# Patient Record
Sex: Female | Born: 1945 | Race: White | Hispanic: No | Smoking: Never smoker
Health system: Southern US, Community
[De-identification: ages and names within clinical notes are randomized; demographics above are authoritative.]

## PROBLEM LIST (undated history)

## (undated) DIAGNOSIS — R112 Nausea with vomiting, unspecified: Secondary | ICD-10-CM

## (undated) DIAGNOSIS — S0990XA Unspecified injury of head, initial encounter: Secondary | ICD-10-CM

## (undated) DIAGNOSIS — F32A Depression, unspecified: Secondary | ICD-10-CM

## (undated) DIAGNOSIS — C9000 Multiple myeloma not having achieved remission: Secondary | ICD-10-CM

## (undated) DIAGNOSIS — F419 Anxiety disorder, unspecified: Secondary | ICD-10-CM

## (undated) DIAGNOSIS — Z9889 Other specified postprocedural states: Secondary | ICD-10-CM

## (undated) DIAGNOSIS — M810 Age-related osteoporosis without current pathological fracture: Secondary | ICD-10-CM

## (undated) DIAGNOSIS — F329 Major depressive disorder, single episode, unspecified: Secondary | ICD-10-CM

## (undated) DIAGNOSIS — J45909 Unspecified asthma, uncomplicated: Secondary | ICD-10-CM

## (undated) HISTORY — DX: Age-related osteoporosis without current pathological fracture: M81.0

## (undated) HISTORY — PX: ABDOMINAL HYSTERECTOMY: SHX81

## (undated) HISTORY — PX: HEMORROIDECTOMY: SUR656

## (undated) HISTORY — DX: Anxiety disorder, unspecified: F41.9

## (undated) HISTORY — PX: SEPTOPLASTY: SUR1290

## (undated) HISTORY — DX: Major depressive disorder, single episode, unspecified: F32.9

## (undated) HISTORY — DX: Depression, unspecified: F32.A

## (undated) HISTORY — PX: MOUTH SURGERY: SHX715

---

## 2005-07-16 ENCOUNTER — Ambulatory Visit: Payer: Self-pay | Admitting: Family Medicine

## 2006-07-08 ENCOUNTER — Emergency Department: Payer: Self-pay | Admitting: Unknown Physician Specialty

## 2006-07-19 ENCOUNTER — Ambulatory Visit: Payer: Self-pay | Admitting: Family Medicine

## 2006-10-31 ENCOUNTER — Emergency Department: Payer: Self-pay | Admitting: Emergency Medicine

## 2006-11-03 ENCOUNTER — Ambulatory Visit: Payer: Self-pay | Admitting: Orthopaedic Surgery

## 2007-07-26 ENCOUNTER — Ambulatory Visit: Payer: Self-pay | Admitting: Family Medicine

## 2007-08-29 ENCOUNTER — Ambulatory Visit: Payer: Self-pay | Admitting: Unknown Physician Specialty

## 2008-08-14 ENCOUNTER — Ambulatory Visit: Payer: Self-pay

## 2010-02-04 ENCOUNTER — Ambulatory Visit: Payer: Self-pay | Admitting: Family Medicine

## 2010-02-11 ENCOUNTER — Ambulatory Visit: Payer: Self-pay | Admitting: Family Medicine

## 2010-05-12 ENCOUNTER — Ambulatory Visit: Payer: Self-pay | Admitting: Family Medicine

## 2010-08-20 ENCOUNTER — Ambulatory Visit: Payer: Self-pay | Admitting: Family Medicine

## 2011-04-14 ENCOUNTER — Ambulatory Visit: Payer: Self-pay | Admitting: Family Medicine

## 2012-03-24 ENCOUNTER — Ambulatory Visit: Payer: Self-pay | Admitting: Primary Care

## 2012-07-11 ENCOUNTER — Ambulatory Visit: Payer: Self-pay | Admitting: Family Medicine

## 2012-10-03 ENCOUNTER — Ambulatory Visit: Payer: Self-pay | Admitting: Family Medicine

## 2012-12-27 LAB — CBC
HCT: 42.4 % (ref 35.0–47.0)
MCH: 31.5 pg (ref 26.0–34.0)
MCHC: 34.7 g/dL (ref 32.0–36.0)
MCV: 91 fL (ref 80–100)
Platelet: 324 10*3/uL (ref 150–440)
WBC: 7 10*3/uL (ref 3.6–11.0)

## 2012-12-27 LAB — BASIC METABOLIC PANEL
Anion Gap: 11 (ref 7–16)
Chloride: 105 mmol/L (ref 98–107)
Co2: 20 mmol/L — ABNORMAL LOW (ref 21–32)
Creatinine: 1.19 mg/dL (ref 0.60–1.30)
EGFR (African American): 55 — ABNORMAL LOW
EGFR (Non-African Amer.): 48 — ABNORMAL LOW
Glucose: 182 mg/dL — ABNORMAL HIGH (ref 65–99)
Osmolality: 278 (ref 275–301)
Sodium: 136 mmol/L (ref 136–145)

## 2012-12-28 ENCOUNTER — Inpatient Hospital Stay: Payer: Self-pay | Admitting: Internal Medicine

## 2012-12-29 LAB — PRO B NATRIURETIC PEPTIDE: B-Type Natriuretic Peptide: 567 pg/mL — ABNORMAL HIGH (ref 0–125)

## 2012-12-29 LAB — CBC WITH DIFFERENTIAL/PLATELET
Basophil %: 0.1 %
Eosinophil #: 0 10*3/uL (ref 0.0–0.7)
HCT: 39 % (ref 35.0–47.0)
Lymphocyte #: 0.8 10*3/uL — ABNORMAL LOW (ref 1.0–3.6)
Lymphocyte %: 5.4 %
MCH: 31.5 pg (ref 26.0–34.0)
MCHC: 34.4 g/dL (ref 32.0–36.0)
MCV: 92 fL (ref 80–100)
Monocyte #: 0.4 x10 3/mm (ref 0.2–0.9)
Neutrophil #: 12.9 10*3/uL — ABNORMAL HIGH (ref 1.4–6.5)
Neutrophil %: 91.6 %
Platelet: 288 10*3/uL (ref 150–440)
RBC: 4.26 10*6/uL (ref 3.80–5.20)
RDW: 13.8 % (ref 11.5–14.5)
WBC: 14.1 10*3/uL — ABNORMAL HIGH (ref 3.6–11.0)

## 2012-12-29 LAB — BASIC METABOLIC PANEL
Anion Gap: 7 (ref 7–16)
BUN: 21 mg/dL — ABNORMAL HIGH (ref 7–18)
Chloride: 108 mmol/L — ABNORMAL HIGH (ref 98–107)
Co2: 25 mmol/L (ref 21–32)
Creatinine: 1.01 mg/dL (ref 0.60–1.30)
EGFR (African American): >60
Osmolality: 286 (ref 275–301)

## 2012-12-31 DIAGNOSIS — I059 Rheumatic mitral valve disease, unspecified: Secondary | ICD-10-CM

## 2013-02-06 ENCOUNTER — Ambulatory Visit: Payer: Self-pay | Admitting: Family Medicine

## 2013-03-23 ENCOUNTER — Ambulatory Visit: Payer: Self-pay

## 2013-05-30 ENCOUNTER — Encounter: Payer: Self-pay | Admitting: Otolaryngology

## 2013-06-04 ENCOUNTER — Encounter: Payer: Self-pay | Admitting: Otolaryngology

## 2013-07-02 ENCOUNTER — Encounter: Payer: Self-pay | Admitting: Otolaryngology

## 2013-10-11 ENCOUNTER — Ambulatory Visit: Payer: Self-pay | Admitting: Family Medicine

## 2013-11-20 ENCOUNTER — Ambulatory Visit: Payer: Self-pay | Admitting: Family Medicine

## 2013-11-21 ENCOUNTER — Ambulatory Visit: Payer: Self-pay | Admitting: Family Medicine

## 2013-11-27 ENCOUNTER — Ambulatory Visit: Payer: Self-pay | Admitting: Physician Assistant

## 2014-01-18 DIAGNOSIS — J4520 Mild intermittent asthma, uncomplicated: Secondary | ICD-10-CM

## 2014-02-26 ENCOUNTER — Emergency Department: Payer: Self-pay | Admitting: Student

## 2014-06-21 ENCOUNTER — Ambulatory Visit: Payer: Self-pay | Admitting: Family Medicine

## 2014-08-24 NOTE — Discharge Summary (Signed)
PATIENT NAME:  Laurie [name], Laurie [name] MR#:  [id number] DATE OF BIRTH:  1945-05-19  DATE OF ADMISSION:  12/28/2012 DATE OF DISCHARGE:  01/01/2013  DISCHARGE DIAGNOSES:  1. Respiratory distress secondary to pneumonia.  2. Asthma exacerbation.  3. Anxiety.  4. Acute on chronic diastolic heart failure.  5. Depression.   DISCHARGE MEDICATIONS:  1. BuSpar 10 mg p.o. t.i.d. 2. Trazodone 200 mg p.o. daily.  3. Prednisone 20 mg 3 tablets daily for 2 days, 2 tablets daily for 2 days and then 1 tablet daily for 2 days and then stop.  4. Singulair 10 mg p.o. daily.  5. Flonase 50 mcg inhalation daily.  6. Lipitor 20 mg daily.  7. Omeprazole 40 mg p.o. daily.  8. Zoloft 100 mg p.o. daily.  9. ProAir 2 puffs every 4 to 6 hours as needed for wheezing.  10. QVAR 80 mcg 4 puffs b.i.d. after the prednisone is finished.  11. Levaquin 750 mg daily for 3 days.   DIET: Low sodium diet.   CONSULTATIONS: None.   HOSPITAL COURSE: This is a 69 year old female patient who is really very anxious, who came in because of shortness of breath. The patient seen by Dr. Baltazar Apo. She follows up with her. According to the patient, she is almost very inactive because of her asthma exacerbations. Gets short of breath very easily. She used to follow up with Dr. Raul Del, but she is now not following with him. She came in because of shortness of breath, hypoxic with O2 saturation 80% on room air. Required 3 liters of oxygen. On 3 liters, she was 90% to 96%. X-ray of chest was concerning for left lower pneumonia. The patient admitted for this and started on Solu-Medrol, Levaquin, oxygen and DuoNebs. The patient's symptoms improved. Before this episode happened, the patient started on prednisone by her primary doctor. On admission, the patient was short of breath with EKG showing of sinus tach at 114 beats per minute. She actually continued to have shortness of breath despite no wheezing in her lungs. We had a CT of the chest to rule  out PE, which was negative for pulmonary emboli. The patient's BNP was elevated at 567. O2 saturations were about 94% on 2 liters, so echocardiogram obtained, which showed EF of 55% to 60% with normal LV function, with impaired LV relaxation. After listening to her reports, the patient felt better, and the patient did have a lot of anxiety component to her symptoms. Restarted her BuSpar and Zoloft. The patient assured that her asthma flare is improved. Her O2 saturations came back to 95% on room air, and her vitals were stable at the time of discharge. We weaned off steroids and discharged her with a course of prednisone and Levaquin. She is advised to continue QVAR and see the primary doctor as scheduled. The patient's troponins were negative on admission. Chest x-ray showed discoid atelectasis in the left lower lobe and shallow inspiration.   TIME SPENT: More than 30 minutes.  ____________________________ Epifanio Lesches, MD sk:OSi D: 01/03/2013 08:45:52 ET T: 01/03/2013 09:23:16 ET JOB#: [id number]  cc: Epifanio Lesches, MD, <Dictator> Sarah "Sallie" Posey Pronto, MD Epifanio Lesches MD ELECTRONICALLY SIGNED 01/13/2013 22:46

## 2014-08-24 NOTE — H&P (Signed)
PATIENT NAME:  Laurie, [name] MR#:  [id number] DATE OF BIRTH:  1945-10-06  DATE OF ADMISSION:  12/27/2012  PRIMARY CARE PHYSICIAN:  Dr. Denton Lank.  REFERRING PHYSICIAN:  Dr. Owens Shark.   CHIEF COMPLAINT:  Cough and shortness of breath.   HISTORY OF PRESENT ILLNESS:  The patient is a 69 year old Caucasian female with a past medical history of asthma, emphysema, is presenting to the ER with a chief complaint of shortness of breath and cough.  The patient is reporting that she has been coughing since last Wednesday associated with shortness of breath and progressively it has been getting worse.  She tried to get an appointment with her primary care physician during last Thursday, but was unable to get any and was seen by her primary care physician yesterday and the patient was started on prednisone.  Also, she was started on Qvar.  Though patient is on prednisone still her shortness of breath is progressively getting worse and was unable to breathe tonight.  The patient was brought into the ER by her family members and initially she was hypoxemic with a pulse ox of 80%.  The patient is placed on 3 liters initially and subsequently oxygen was reduced to 2 liters the patient was sating 92% to 96%.  Chest x-ray has revealed developing left lower lobe pneumonia.  The patient was also wheezing.  Regarding this, the patient was given Solu-Medrol IV and levofloxacin IV.  Hospitalist team is called to admit the patient.  During my examination, the patient started feeling better and reporting that shortness of breath is significantly improving.  Denies any chest pain, abdominal pain, nausea, vomiting, diarrhea.   PAST MEDICAL HISTORY:  Chronic history of asthma, anxiety, panic attacks, emphysema, seasonal allergies.   PAST SURGICAL HISTORY:  Hysterectomy, hemorrhoidectomy.   ALLERGIES:  No known drug allergies.   PSYCHOSOCIAL HISTORY:  Lives at home with sister-in-law.  Denies any history of smoking, alcohol or  illicit drug use.   FAMILY HISTORY:  Dad deceased with emphysema.  Mom was deceased in a motor vehicle accident.   HOME MEDICATIONS:  Zoloft, trazodone, Protonix, Motrin, BuSpar.  Doses unknown.   REVIEW OF SYSTEMS:   CONSTITUTIONAL:  Denies any fever, fatigue.  EYES:  Denies blurry vision, glaucoma.  EARS, NOSE, THROAT:  No epistaxis or discharge.  RESPIRATION:  Complaining of a cough, chronic history of emphysema and asthma are present.  CARDIOVASCULAR:  Denies any chest pain, palpitations.  GASTROINTESTINAL:  Denies nausea, vomiting, diarrhea.  GENITOURINARY:  No dysuria, hematuria.  GYNECOLOGIC AND BREAST:  Denies breast mass or vaginal discharge, status post hysterectomy.  ENDOCRINE:  Denies polyuria, nocturia, increased sweating.  HEMATOLOGIC AND LYMPHATIC:  No anemia, easy bruising, bleeding.  INTEGUMENTARY:  No acne, rash, lesions.  MUSCULOSKELETAL:  Denies any joint pain in neck, back pain.  Denies gout.  NEUROLOGIC:  No vertigo or ataxia.  PSYCHIATRIC:  No ADD, OCD, chronic history of anxiety is present.   PHYSICAL EXAMINATION: VITAL SIGNS:  Temperature 98.5, pulse 98, respirations 22, blood pressure 165/93, pulse ox 96% on 2 liters. GENERAL APPEARANCE:  Not under acute distress.  Moderately built and nourished.  HEENT:  Normocephalic, atraumatic.  Pupils are equal, reacting to light and accommodation.  No scleral icterus.  No conjunctival injection.  No sinus tenderness.  No postnasal drip.  NECK:  Supple.  No JVD.  No thyromegaly.  LUNGS:  Positive crackles at the bases, more on the left than on the right.  Distant breath sounds.  Moderate  air entry.  Minimal end expiratory wheezing is present.  CARDIAC:  S1, S2 normal.  Regular rate and rhythm.  No murmurs.  GASTROINTESTINAL:  Soft.  Bowel sounds are positive in all four quadrants.  Nontender, nondistended.  No hepatosplenomegaly.  NEUROLOGIC:  Awake, alert, oriented x 3.  Motor and sensory grossly intact.  Reflexes are 2+.   EXTREMITIES:  No edema.  No cyanosis.  No clubbing.  PSYCHIATRIC:  Normal mood and affect.  MUSCULOSKELETAL:  No joint effusion, tenderness or erythema.   LABORATORY AND IMAGING STUDIES:  Glucose 182, BUN 16, creatinine 1.19, sodium 136, potassium 3.9, chloride 105, CO2 20.  Anion gap 11.  GFR is 48.  Serum osmolality 278, calcium 9.3, troponin less than 0.02.  CBC is normal.  Chest x-ray, developing left lower lobe infiltrate.  A 12-lead EKG, sinus tachycardia at 114 beats per minute.  Normal PR and QRS interval.  Left ventricular hypertrophy.  No acute ST-T wave changes.   ASSESSMENT AND PLAN:  A 69 year old Caucasian female presenting to the ER with a chief complaint of shortness of breath and hypoxia will be admitted with the following assessment and plan.  1.  Shortness of breath from asthma exacerbation and developing pneumonia.  The patient will be admitted to telemetry.  The patient will be on Solu-Medrol and neb treatments.  We will provide her IV antibiotic levofloxacin.  Sputum culture and sensitivity will be obtained.  2.  Chronic history of asthma and emphysema.  3.  Chronic history of anxiety.  We will do med reconciliation and resume medications.  4.  Hypoxemia is significantly improved.  5.  We will provide her GI and DVT prophylaxis.   Diagnosis and plan of care was discussed in detail with the patient.  She is aware of the plan.   CODE STATUS:  SHE IS FULL CODE.  Sister-in-law is her medical power of attorney.   Total time spent on admission is 50 minutes.     ____________________________ Nicholes Mango, MD ag:ea D: 12/28/2012 02:52:56 ET T: 12/28/2012 03:39:09 ET JOB#: [id number]  cc: Nicholes Mango, MD, <Dictator> Sarah "Sallie" Posey Pronto, MD Nicholes Mango MD ELECTRONICALLY SIGNED 01/06/2013 2:21

## 2014-08-28 ENCOUNTER — Encounter
Admit: 2014-08-28 | Disposition: A | Discharge status: Home / Self Care | Payer: Self-pay | Attending: Family Medicine | Admitting: Family Medicine

## 2014-09-10 ENCOUNTER — Encounter: Payer: Self-pay | Admitting: Physical Therapy

## 2014-09-10 ENCOUNTER — Ambulatory Visit: Payer: Medicare Other | Attending: Family Medicine | Admitting: Physical Therapy

## 2014-09-10 DIAGNOSIS — Z87820 Personal history of traumatic brain injury: Secondary | ICD-10-CM | POA: Diagnosis not present

## 2014-09-10 DIAGNOSIS — R2689 Other abnormalities of gait and mobility: Secondary | ICD-10-CM | POA: Insufficient documentation

## 2014-09-10 DIAGNOSIS — R296 Repeated falls: Secondary | ICD-10-CM | POA: Insufficient documentation

## 2014-09-10 DIAGNOSIS — M6281 Muscle weakness (generalized): Secondary | ICD-10-CM

## 2014-09-10 DIAGNOSIS — R269 Unspecified abnormalities of gait and mobility: Secondary | ICD-10-CM

## 2014-09-10 NOTE — Therapy (Signed)
[facility] [address] [place], [place], [address] Phone: [phone]   Fax:  [phone]  Physical Therapy Treatment  Patient Details  Name: Laurie [name] MRN: [id number] Date of Birth: 12-Jul-1945 Referring Provider:  Denton Lank, MD  Encounter Date: 09/10/2014      PT End of Session - 09/10/14 1307    Visit Number 2   Number of Visits 12   Date for PT Re-Evaluation 10/09/14   Authorization - Visit Number 2   Authorization - Number of Visits 10   PT Start Time 3532   PT Stop Time 0951   PT Time Calculation (min) 56 min   Equipment Utilized During Treatment Gait belt   Activity Tolerance No increased pain;Patient limited by fatigue   Behavior During Therapy [facility] for tasks assessed/performed      Past Medical History  Diagnosis Date  . Anxiety   . Depression   . Osteoporosis     No past surgical history on file.  There were no vitals filed for this visit.  Visit Diagnosis:  Abnormality of gait  Muscle weakness (generalized)      Subjective Assessment - 09/10/14 1302    Subjective Pt. states she has difficulty completing current HEP without increase pain in neck/ L shoulder region.  Pt. entered PT with use of SPC today.     Pertinent History see evaluation   Limitations Standing;Walking   Patient Stated Goals improve balance/walking and decrease neck pain.     Currently in Pain? Yes   Pain Score 2    Pain Location Neck   Pain Orientation Left   Pain Onset More than a month ago   Pain Frequency Intermittent   Aggravating Factors  exercising/ increase activity.       OBJECTIVE:  Reviewed HEP.  Added standing UT L/R stretches and chin tucks (see handouts).  Scifit L4 5 min. B UE/LE (no pain or issues reported)- no charge.  Neuromuscular reed.:  Resisted gait 1BTB 5x all 4-planes with min. To no UE assist.  Tandem stance/ walking/ turning/ recip. Step touches/ functional reaching/ forwards, backwards, lateral  walking with no UE assist (mirror feedback for posture correction).      Pt response for medical necessity:  No increase c/o pain.  Several short seated rest breaks required secondary to SOB/ asthma issues.         PT Education - 09/10/14 1307    Education provided Yes   Education Details reviewed HEP/ issued new HEP   Person(s) Educated Patient   Methods Explanation;Demonstration;Handout   Comprehension Verbalized understanding;Returned demonstration             PT Long Term Goals - 09/10/14 1318    PT LONG TERM GOAL #1   Title Patient will be independent with home exercise program for decreased fall risk and improve safety with functional activities    Time 6   Period Weeks   Status Not Met   PT LONG TERM GOAL #2   Title Patient will be able to stand with eyes closed for > 30 seconds for decreased risk for falls    Time 6   Period Weeks   Status Not Met   PT LONG TERM GOAL #3   Title Patient will improve Dynamic Gait Index (DGI) score to > 20/24 for low falls risk regarding dynamic walking tasks    Time 6   Period Weeks   Status Not Met   PT LONG TERM  GOAL #4   Title Patient will increase ABC Scale score to > or = 67% demonstrating a decreased risk for falls    Time 6   Period Weeks   Status Not Met               Plan - 09/10/14 1315    Clinical Impression Statement Pt. has limited endurance with all standing/ ther.ex. and requires several short seated rest breaks to prevent shortness of breath.  No LOB during tx. session.  Good technique/ demonstrate of HEP with minimal cuing to modify technique.    Pt will benefit from skilled therapeutic intervention in order to improve on the following deficits Decreased endurance;Improper body mechanics;Decreased strength;Postural dysfunction;Decreased balance;Decreased mobility;Difficulty walking;Pain;Decreased range of motion   Rehab Potential Good   PT Frequency 2x / week   PT Duration 6 weeks   PT  Treatment/Interventions ADLs/Self Care Home Management;Gait training;Neuromuscular re-education;Stair training;Patient/family education;Functional mobility training;Therapeutic activities;Therapeutic exercise;Manual techniques;Balance training;Moist Heat   PT Next Visit Plan reassess HEP/gait and balance training.    PT Home Exercise Plan see handouts.    Consulted and Agree with Plan of Care Patient        Problem List There are no active problems to display for this patient.   Pura Spice, PT, DPT # [phone]   09/10/2014, 1:26 PM  [facility] [address] [place], [place], [address] Phone: [phone]   Fax:  [phone]

## 2014-09-10 NOTE — Patient Instructions (Signed)
Issued HEP handouts:  AROM cerv retract (chin tuck) sit/stand and Stretch Trapezius upper (seated or standing).

## 2014-09-12 ENCOUNTER — Ambulatory Visit: Payer: Medicare Other | Admitting: Physical Therapy

## 2014-09-17 ENCOUNTER — Ambulatory Visit: Payer: Medicare Other | Admitting: Physical Therapy

## 2014-09-17 ENCOUNTER — Encounter: Payer: Self-pay | Admitting: Physical Therapy

## 2014-09-17 DIAGNOSIS — M6281 Muscle weakness (generalized): Secondary | ICD-10-CM

## 2014-09-17 DIAGNOSIS — R269 Unspecified abnormalities of gait and mobility: Secondary | ICD-10-CM

## 2014-09-17 DIAGNOSIS — R2689 Other abnormalities of gait and mobility: Secondary | ICD-10-CM | POA: Diagnosis not present

## 2014-09-17 NOTE — Therapy (Signed)
[facility] [address] [place], [place], [address] Phone: [phone]   Fax:  [phone]  Physical Therapy Treatment  Patient Details  Name: Laurie [name] MRN: [id number] Date of Birth: 04-02-1946 Referring Provider:  Hillery Aldo, MD  Encounter Date: 09/17/2014      PT End of Session - 09/17/14 0945    Visit Number 3   Number of Visits 12   Date for PT Re-Evaluation 10/09/14   Authorization - Visit Number 3   Authorization - Number of Visits 10   PT Start Time 0857   PT Stop Time 0946   PT Time Calculation (min) 49 min   Equipment Utilized During Treatment Gait belt   Activity Tolerance No increased pain;Patient limited by fatigue   Behavior During Therapy [facility] for tasks assessed/performed      Past Medical History  Diagnosis Date  . Anxiety   . Depression   . Osteoporosis     History reviewed. No pertinent past surgical history.  There were no vitals filed for this visit.  Visit Diagnosis:  Abnormality of gait  Muscle weakness (generalized)      Subjective Assessment - 09/17/14 0900    Subjective Pt. states she has been walking more since last PT visit and not using SPC.  Pt. didn't bring SPC to [employer] yesterday.     Limitations Standing;Walking   Patient Stated Goals improve balance/walking and decrease neck pain.     Currently in Pain? Yes   Pain Score 1    Pain Location Neck   Pain Orientation Left   Pain Onset More than a month ago   Aggravating Factors  increase activity.   Pain Relieving Factors memory pillow has helped at night   Multiple Pain Sites No       OBJECTIVE: Seated UT and levator stretches with PT over pressure L/R stretches and chin tucks (reviewed handouts). Scifit L4 7 min. B UE/LE - no charge. Neuromuscular reed.:  Step ups/downs with no UE assist 10x2 (mirror feedback). Resisted gait 2BTB 5x all 4-planes with no UE assist today (increase resistance/ improved technique/  gait pattern).Sit to stands 10x with no UE (25.11 sec.).  Tandem stance/ walking, walking forwards/ backwards/ lateral walking with no UE assist (mirror feedback for posture correction).  SLS (>15 sec. On L/R with no UE assist.  Outside walking on various terrain (grass/ curbs/ ramps).        Pt response for medical necessity: No increase c/o pain. Several short seated rest breaks required secondary to SOB/ asthma issues.  (+) L UT muscle tightness/ tenderness        PT Long Term Goals - 09/17/14 0948    PT LONG TERM GOAL #1   Title Patient will be independent with home exercise program for decreased fall risk and improve safety with functional activities    Time 6   Period Weeks   Status Not Met   PT LONG TERM GOAL #2   Title Patient will be able to stand with eyes closed for > 30 seconds for decreased risk for falls    Time 6   Period Weeks   Status Not Met   PT LONG TERM GOAL #3   Title Patient will improve Dynamic Gait Index (DGI) score to > 20/24 for low falls risk regarding dynamic walking tasks    Time 6   Period Weeks   Status Not Met   PT LONG TERM GOAL #4  Title Patient will increase ABC Scale score to > or = 67% demonstrating a decreased risk for falls    Time 6   Period Weeks   Status Not Met               Plan - 09/17/14 0946    Clinical Impression Statement Marked improvement in normalized gait pattern and balance training today.  Pt. able to complete all tasks with SBA/mod. I safely and ambulate on uneven/grassy terrain with no LOB.  Several rest breaks required secondary to asthma/ shortness of breath.     Pt will benefit from skilled therapeutic intervention in order to improve on the following deficits Decreased endurance;Improper body mechanics;Decreased strength;Postural dysfunction;Decreased balance;Decreased mobility;Difficulty walking;Pain;Decreased range of motion   Rehab Potential Good   PT Frequency 2x / week   PT Duration 6  weeks   PT Treatment/Interventions ADLs/Self Care Home Management;Gait training;Neuromuscular re-education;Stair training;Patient/family education;Functional mobility training;Therapeutic activities;Therapeutic exercise;Manual techniques;Balance training;Moist Heat   PT Next Visit Plan gait/balance training   PT Home Exercise Plan see handouts.    Consulted and Agree with Plan of Care Patient        Problem List There are no active problems to display for this patient.   Pura Spice, PT, DPT # [phone]   09/17/2014, 9:52 AM  [facility] [address] [place], [place], [address] Phone: [phone]   Fax:  [phone]

## 2014-09-19 ENCOUNTER — Ambulatory Visit: Payer: Medicare Other | Admitting: Physical Therapy

## 2014-09-19 DIAGNOSIS — M6281 Muscle weakness (generalized): Secondary | ICD-10-CM

## 2014-09-19 DIAGNOSIS — R269 Unspecified abnormalities of gait and mobility: Secondary | ICD-10-CM

## 2014-09-19 DIAGNOSIS — R2689 Other abnormalities of gait and mobility: Secondary | ICD-10-CM | POA: Diagnosis not present

## 2014-09-19 NOTE — Therapy (Signed)
[facility] [address] [place], [place], [address] Phone: [phone]   Fax:  [phone]  Physical Therapy Treatment  Patient Details  Name: Laurie [name] MRN: [id number] Date of Birth: 1945/08/24 Referring Provider:  Denton Lank, MD  Encounter Date: 09/19/2014      PT End of Session - 09/19/14 1014    Visit Number 4   Number of Visits 12   Date for PT Re-Evaluation 10/09/14   Authorization - Visit Number 4   Authorization - Number of Visits 10   PT Start Time 0902   PT Stop Time 1001   PT Time Calculation (min) 59 min   Activity Tolerance No increased pain;Patient limited by fatigue   Behavior During Therapy [facility] for tasks assessed/performed      Past Medical History  Diagnosis Date  . Anxiety   . Depression   . Osteoporosis     No past surgical history on file.  There were no vitals filed for this visit.  Visit Diagnosis:  Abnormality of gait  Muscle weakness (generalized)      Subjective Assessment - 09/19/14 1009    Subjective Pt. having a difficulty day with breathing/ asthma.  Pt. reports being active Monday after PT tx. session (walked around [place] put together a TV stand) resulting in increase fatigue/ breathing issues yesterday.  Pt. used inhaler this morning.  No SPC use over past few days.    Pertinent History see evaluation   Limitations Standing;Walking   Patient Stated Goals improve balance/walking and decrease neck pain.     Currently in Pain? Yes   Pain Score 1    Pain Location Neck   Pain Orientation Left       OBJECTIVE: Scifit L4 3.5 min. Then rest break/ 2.5 min B UE/LE - no charge.  Seated/ standing 2.5# hip ex./ high marches/ lateral step ups 20x each.  Reviewed L UT muscle tenderness during neck stretches. Neuromuscular reed.: Step ups/downs (2.5# ankle wts.) with no UE assist 10x2 (mirror feedback)- working on coordination. Tandem stance/ walking, forward/ backwards/ lateral  walking with no UE assist (mirror feedback for posture correction).  [facility] touches/ varying colors with no UE assist (mirror feedback). Gait training: walking in hallway working on alternating arm swing/ upright posture/ consistent step pattern and heel strike.    Pt response for medical necessity: No increase c/o pain. Several short seated rest breaks required secondary to SOB/ asthma issues. Decrease L UT muscle tenderness.  Slight increase in L neck/ UT discomfort/tightness with active cervical ext./ L rotn.           PT Long Term Goals - 09/17/14 0948    PT LONG TERM GOAL #1   Title Patient will be independent with home exercise program for decreased fall risk and improve safety with functional activities    Time 6   Period Weeks   Status Not Met   PT LONG TERM GOAL #2   Title Patient will be able to stand with eyes closed for > 30 seconds for decreased risk for falls    Time 6   Period Weeks   Status Not Met   PT LONG TERM GOAL #3   Title Patient will improve Dynamic Gait Index (DGI) score to > 20/24 for low falls risk regarding dynamic walking tasks    Time 6   Period Weeks   Status Not Met   PT LONG TERM GOAL #4   Title Patient will increase  ABC Scale score to > or = 67% demonstrating a decreased risk for falls    Time 6   Period Weeks   Status Not Met            Plan - 09/19/14 1014    Clinical Impression Statement Several short seated rest breaks required due to breathing issues/ SOB.  O2 saturation rate 96-98% t/o tx. session.  No LOB with higher level balacne tasks with ankle wts. on today.  Pt. ambulating with more confidence but still lacks arm swing.     Pt will benefit from skilled therapeutic intervention in order to improve on the following deficits Decreased endurance;Improper body mechanics;Decreased strength;Postural dysfunction;Decreased balance;Decreased mobility;Difficulty walking;Pain;Decreased range of motion   Rehab Potential Good    PT Frequency 2x / week   PT Duration 6 weeks   PT Treatment/Interventions ADLs/Self Care Home Management;Gait training;Neuromuscular re-education;Stair training;Patient/family education;Functional mobility training;Therapeutic activities;Therapeutic exercise;Manual techniques;Balance training;Moist Heat;Energy conservation   PT Next Visit Plan gait/balance training   Consulted and Agree with Plan of Care Patient        Problem List There are no active problems to display for this patient.  Pura Spice, PT, DPT # [phone]   09/19/2014, 10:23 AM  [place] [facility] [address] [place], [place], [address] Phone: [phone]   Fax:  [phone]

## 2014-09-24 ENCOUNTER — Ambulatory Visit: Payer: Medicare Other | Admitting: Physical Therapy

## 2014-09-24 VITALS — HR 115

## 2014-09-24 DIAGNOSIS — R2689 Other abnormalities of gait and mobility: Secondary | ICD-10-CM | POA: Diagnosis not present

## 2014-09-24 DIAGNOSIS — R269 Unspecified abnormalities of gait and mobility: Secondary | ICD-10-CM

## 2014-09-24 DIAGNOSIS — M6281 Muscle weakness (generalized): Secondary | ICD-10-CM

## 2014-09-24 NOTE — Therapy (Signed)
[facility] [address] [place], [place], [address] Phone: [phone]   Fax:  [phone]  Physical Therapy Treatment  Patient Details  Name: Laurie [name] MRN: [id number] Date of Birth: [birth year] Referring Provider:  Denton Lank, MD  Encounter Date: 09/24/2014      PT End of Session - 09/24/14 0956    Visit Number 5   Number of Visits 12   Date for PT Re-Evaluation 10/09/14   Authorization - Visit Number 5   Authorization - Number of Visits 10   PT Start Time 0903   PT Stop Time 0953   PT Time Calculation (min) 50 min   Activity Tolerance No increased pain;Patient limited by fatigue   Behavior During Therapy [facility] for tasks assessed/performed      Past Medical History  Diagnosis Date  . Anxiety   . Depression   . Osteoporosis     No past surgical history on file.  Filed Vitals:   09/24/14 0954  Pulse: 115  SpO2: 95%    Visit Diagnosis:  Abnormality of gait  Muscle weakness (generalized)      Subjective Assessment - 09/24/14 0955    Subjective Pt. reports doing much better today but had difficutly with asthma this past weekend.  Pt. used inhaler this morning and reports minimal complianece with HEP due to breathing issues.  Pt. states L UT region is sore/tight this morning.    Limitations Standing;Walking   Patient Stated Goals improve balance/walking and decrease neck pain.     Currently in Pain? No/denies       ABC Scale:  87.5% Sit to stand 10x: 25.36 sec. Dynamic Gait Index: 23/24   OBJECTIVE: Scifit L4 10 min. B UE/LE (no rest breaks with consistent cadence).  Seated/ standing 2.5# hip ex./ high marches/ lateral step ups 20x each. Seated L UT stretches/ STM 5 min. Neuromuscular reed.: Step ups/downs (2.5# ankle wts.) with no UE assist 10x2 (mirror feedback)- working on coordination.  Wall squats 20x with good technique.  Sit to stands (25.36 sec.).  Airex heel raises/ marching. Tandem stance/  walking, forward/ backwards/ lateral walking with no UE assist (mirror feedback for posture correction). Reassessed DGI and ABC scale. Walking outside on varying terrain/ curbs/ ramps (no issues).    Pt response for medical necessity: Marked improvement in overall balance/ endurance today.  Pt. Has met all balance/gait goals but will continue to benefit from strength training.          PT Long Term Goals - 09/24/14 0906    PT LONG TERM GOAL #1   Title Patient will be independent with home exercise program for decreased fall risk and improve safety with functional activities    Time 6   Period Weeks   Status Achieved   PT LONG TERM GOAL #2   Title Patient will be able to stand with eyes closed for > 30 seconds for decreased risk for falls    Time 6   Period Weeks   Status Achieved   PT LONG TERM GOAL #3   Title Patient will improve Dynamic Gait Index (DGI) score to > 20/24 for low falls risk regarding dynamic walking tasks    Baseline 23/24   Time 6   Period Weeks   Status Achieved   PT LONG TERM GOAL #4   Title Patient will increase ABC Scale score to > or = 67% demonstrating a decreased risk for falls    Baseline 87.5%  Time 6   Period Weeks   Status Achieved   PT LONG TERM GOAL #5   Title Pt. will complete sit to stand 10x in <20 seconds to demonstate improved LE strength/ muscle endurance.    Baseline 25.36 sec. on 09/24/14   Time 4   Period Weeks   Status New   Additional Long Term Goals   Additional Long Term Goals Yes   PT LONG TERM GOAL #6   Title Pt. able to participate with a 30 minutes ther.ex. program to promote progression to gym based/ HEP to maintain strength/functional gains.     Time 4   Period Weeks   Status New            Plan - 09/24/14 0957    Clinical Impression Statement Only a few rest breaks required today with marked improvement in Scifit/ muscle endurance today (completed 10 consecutive minutes).   Pt. has met all current  PT goals/ marked improvement in balance and gait and will benefit from strength training.    Pt will benefit from skilled therapeutic intervention in order to improve on the following deficits Decreased endurance;Improper body mechanics;Decreased strength;Postural dysfunction;Decreased balance;Decreased mobility;Difficulty walking;Pain;Decreased range of motion   Rehab Potential Good   PT Frequency 2x / week   PT Duration 6 weeks   PT Treatment/Interventions ADLs/Self Care Home Management;Gait training;Neuromuscular re-education;Stair training;Patient/family education;Functional mobility training;Therapeutic activities;Therapeutic exercise;Manual techniques;Balance training;Moist Heat;Energy conservation   PT Next Visit Plan LE/ core strengthening and increase overall endurance        Problem List There are no active problems to display for this patient.   Pura Spice, PT, DPT # [phone]  09/24/2014, 10:14 AM  [place] [facility] [address] [place], [place], [address] Phone: [phone]   Fax:  [phone]

## 2014-09-26 ENCOUNTER — Ambulatory Visit: Payer: Medicare Other | Admitting: Physical Therapy

## 2014-09-26 DIAGNOSIS — R2689 Other abnormalities of gait and mobility: Secondary | ICD-10-CM | POA: Diagnosis not present

## 2014-09-26 DIAGNOSIS — M6281 Muscle weakness (generalized): Secondary | ICD-10-CM

## 2014-09-26 DIAGNOSIS — R269 Unspecified abnormalities of gait and mobility: Secondary | ICD-10-CM

## 2014-09-26 NOTE — Therapy (Signed)
[place] [facility] [address] [place], [place], [address] Phone: [phone]   Fax:  [phone]  Physical Therapy Treatment  Patient Details  Name: MONCERRATH BERHE MRN: [id number] Date of Birth: 1945-12-09 Referring Provider:  Denton Lank, MD  Encounter Date: 09/26/2014      PT End of Session - 09/26/14 1145    Visit Number 6   Number of Visits 12   Date for PT Re-Evaluation 10/09/14   Authorization - Visit Number 6   Authorization - Number of Visits 10   PT Start Time 3151   PT Stop Time 0946   PT Time Calculation (min) 51 min   Activity Tolerance No increased pain;Patient limited by fatigue   Behavior During Therapy [facility] for tasks assessed/performed      Past Medical History  Diagnosis Date  . Anxiety   . Depression   . Osteoporosis     No past surgical history on file.  There were no vitals filed for this visit.  Visit Diagnosis:  Abnormality of gait  Muscle weakness (generalized)      Subjective Assessment - 09/26/14 1143    Subjective Pt. states asthma is continuing to improve this week.  Pt. did use inhaler this morning but also was more active yesterday.  No c/o pain at this time and no LOB or use of assistive devices required.     Limitations Standing;Walking   Patient Stated Goals Improve LE muscle strength/ muscle endurance/ balance/ walking.   Currently in Pain? No/denies         OBJECTIVE: Scifit L4.5 10 min. B UE/LE (no rest breaks with consistent cadence).  Seated/ standing 2.5# hip ex./ high marches/ lateral step ups 20x each. Seated STM to L UT/cervical region 5 min. Wall squats 20x with good technique. Sit to stands 10x2 (no UE assist).Resisted gait 2BTB 10x all 4 planes with min. To no UE assist.  Stair climbing 4 steps x 10 with no UE assist with recip. Pattern. Reviewed HEP/ walking program. . .    Pt response for medical necessity: Marked improvement in overall balance/  endurance today. Pt. Requires occasional cuing for proper technique with there.ex. Or correction of upright posture.         PT Long Term Goals - 09/24/14 0906    PT LONG TERM GOAL #1   Title Patient will be independent with home exercise program for decreased fall risk and improve safety with functional activities    Time 6   Period Weeks   Status Achieved   PT LONG TERM GOAL #2   Title Patient will be able to stand with eyes closed for > 30 seconds for decreased risk for falls    Time 6   Period Weeks   Status Achieved   PT LONG TERM GOAL #3   Title Patient will improve Dynamic Gait Index (DGI) score to > 20/24 for low falls risk regarding dynamic walking tasks    Baseline 23/24   Time 6   Period Weeks   Status Achieved   PT LONG TERM GOAL #4   Title Patient will increase ABC Scale score to > or = 67% demonstrating a decreased risk for falls    Baseline 87.5%   Time 6   Period Weeks   Status Achieved   PT LONG TERM GOAL #5   Title Pt. will complete sit to stand 10x in <20 seconds to demonstate improved LE strength/ muscle endurance.    Baseline  25.36 sec. on 09/24/14   Time 4   Period Weeks   Status New   Additional Long Term Goals   Additional Long Term Goals Yes   PT LONG TERM GOAL #6   Title Pt. able to participate with a 30 minutes ther.ex. program to promote progression to gym based/ HEP to maintain strength/functional gains.     Time 4   Period Weeks   Status New            Plan - 09/26/14 1146    Clinical Impression Statement Pt. moving well around PT gym with no assistive device and no gait belt.  Consistent step pattern/ heel strike with no LOB noted.  Few seated rest breaks with resistive or standing ther.ex.     Pt will benefit from skilled therapeutic intervention in order to improve on the following deficits Decreased endurance;Improper body mechanics;Decreased strength;Postural dysfunction;Decreased balance;Decreased mobility;Difficulty  walking;Pain;Decreased range of motion   Rehab Potential Good   PT Frequency 2x / week   PT Duration 6 weeks   PT Treatment/Interventions ADLs/Self Care Home Management;Gait training;Neuromuscular re-education;Stair training;Patient/family education;Functional mobility training;Therapeutic activities;Therapeutic exercise;Manual techniques;Balance training;Moist Heat;Energy conservation   PT Next Visit Plan LE/ core strengthening and increase overall endurance/  Issue core strengthening ex. program.        Problem List There are no active problems to display for this patient.  Pura Spice, PT, DPT # [phone]   09/26/2014, 11:48 AM  [place] [facility] [address] [place], [place], [address] Phone: [phone]   Fax:  [phone]

## 2014-10-03 ENCOUNTER — Encounter: Payer: Self-pay | Admitting: Physical Therapy

## 2014-10-03 ENCOUNTER — Ambulatory Visit: Payer: Medicare Other | Attending: Family Medicine

## 2014-10-03 DIAGNOSIS — R269 Unspecified abnormalities of gait and mobility: Secondary | ICD-10-CM | POA: Insufficient documentation

## 2014-10-03 DIAGNOSIS — M6281 Muscle weakness (generalized): Secondary | ICD-10-CM

## 2014-10-03 NOTE — Therapy (Signed)
[place] [facility] [facility] [address] [place], [place], [address] Phone: [phone]   Fax:  [phone]  Physical Therapy Treatment  Patient Details  Name: Laurie [name] MRN: [id number] Date of Birth: Sep 26, 1945 Referring Provider:  Denton Lank, MD  Encounter Date: 10/03/2014      PT End of Session - 10/03/14 0916    Visit Number 7   Number of Visits 12   Date for PT Re-Evaluation 10/09/14   Authorization - Visit Number 7   Authorization - Number of Visits 10   PT Start Time 0902   PT Stop Time 0950   PT Time Calculation (min) 48 min   Equipment Utilized During Treatment Gait belt   Activity Tolerance No increased pain;Patient limited by fatigue   Behavior During Therapy [facility] for tasks assessed/performed      Past Medical History  Diagnosis Date  . Anxiety   . Depression   . Osteoporosis     History reviewed. No pertinent past surgical history.  There were no vitals filed for this visit.  Visit Diagnosis:  Abnormality of gait  Muscle weakness (generalized)      Subjective Assessment - 10/03/14 0913    Subjective Pt reports that she is continuing to improve. She is excited about not having to use her cane to ambulate and is now able to ascend 8-9 stairs without using the rails. No falls since last October. She is performing HEP almost everyday. No specific questions or concerns upon arrival.   Patient Stated Goals Improve LE muscle strength/ muscle endurance/ balance/ walking.   Currently in Pain? No/denies      OBJECTIVE:  Scifit L4.5 8 min. B UE/LE (no rest breaks with consistent cadence). Standing 2.5# hip ex./ high marches/ hip abduction x 20 each bilateral; 2.5# forward and lateral step ups x 10 with each extremity. Standing squats at parallel bars with proper form and technique. Resisted gait 2BTB 10x all 4 planes with no UE assist. Tandem walking in // bars x 4 cycles; Hooklying marches, bridges, and bicycles with TrA  contraction (issued to HEP) x 10 each; Pt. Requires occasional cuing for proper technique with there.ex. Pt making excellent progress with therapy.                           PT Education - 10/03/14 0915    Education provided Yes   Education Details Reviewed HEP, issued supine march, supine bridge and supine bike for core stabilization to add to HEP.    Person(s) Educated Patient   Methods Explanation;Demonstration;Tactile cues;Verbal cues;Handout   Comprehension Verbalized understanding             PT Long Term Goals - 10/03/14 0950    PT LONG TERM GOAL #1   Period Weeks               Plan - 10/03/14 0086    Clinical Impression Statement Pt is demonstrating increased confidence in her balance and is able to ambulate today without an assistive device. She is able to complete all exercises today as instructed but is somewhat limited by fatigue requiring seated rest breaks between sets. Pt was issued additional exercises and encouraged to continue current HEP.    PT Next Visit Plan LE/ core strengthening and increase overall endurance/  Issue core strengthening ex. program.   PT Home Exercise Plan Continue current HEP.   Consulted and Agree with Plan of Care Patient  Problem List There are no active problems to display for this patient.  Phillips Grout PT, DPT   Aycen Porreca 10/03/2014, 9:55 AM  [place] [facility] [address] [place], [place], [address] Phone: [phone]   Fax:  [phone]

## 2014-10-08 ENCOUNTER — Ambulatory Visit: Payer: Medicare Other | Admitting: Physical Therapy

## 2014-10-08 ENCOUNTER — Encounter: Payer: Self-pay | Admitting: Physical Therapy

## 2014-10-08 VITALS — HR 90

## 2014-10-08 DIAGNOSIS — M6281 Muscle weakness (generalized): Secondary | ICD-10-CM

## 2014-10-08 DIAGNOSIS — R269 Unspecified abnormalities of gait and mobility: Secondary | ICD-10-CM

## 2014-10-08 NOTE — Therapy (Signed)
[facility] [address] [place], [place], [address] Phone: [phone]   Fax:  [phone]  Physical Therapy Treatment  Patient Details  Name: Laurie [name] MRN: [id number] Date of Birth: [birth year] Referring Provider:  Denton Lank, MD  Encounter Date: 10/08/2014      PT End of Session - 10/08/14 0939    Visit Number 8   Number of Visits 12   Date for PT Re-Evaluation 10/09/14   Authorization - Visit Number 8   Authorization - Number of Visits 10   PT Start Time 2263   PT Stop Time 0939   PT Time Calculation (min) 42 min   Activity Tolerance No increased pain;Patient limited by fatigue   Behavior During Therapy [facility] for tasks assessed/performed      Past Medical History  Diagnosis Date  . Anxiety   . Depression   . Osteoporosis     History reviewed. No pertinent past surgical history.  Filed Vitals:   10/08/14 0904  Pulse: 90  SpO2: 97%    Visit Diagnosis:  Abnormality of gait  Muscle weakness (generalized)      Subjective Assessment - 10/08/14 0905    Subjective Pt. states her asthma has been bad for past 5 days.  No balance issues reported and pt. able to ascend/descned 10 steps while carrying bags into house without handrails.  Pt. says rain this past weekend really caused increase discomfort in posterior head.     Limitations Standing;Walking   How long can you walk comfortably? 20 min. (depends on asthma).     Patient Stated Goals Improve LE muscle strength/ muscle endurance/ balance/ walking.   Currently in Pain? No/denies       OBJECTIVE: Scifit L5 10 min. B UE/LE (no rest breaks with consistent cadence)- O2 saturation rate (97%), HR (112 bpm.).  Ascending/descend 4 steps 5x with no UE assist with recip. Pattern (no issues).  Reviewed/discussed HEP in depth.  Sit to stands 10x from chair with no UE assist <20 sec. Resisted gait 2BTB 7x all 4 planes with no UE assist.   Walking in clinic/ hallway with  discussion on proper technique (heel strike/ toe off).           PT Long Term Goals - 10/08/14 0943    PT LONG TERM GOAL #1   Title Patient will be independent with home exercise program for decreased fall risk and improve safety with functional activities    Time 6   Period Weeks   Status Achieved   PT LONG TERM GOAL #2   Title Patient will be able to stand with eyes closed for > 30 seconds for decreased risk for falls    Time 6   Period Weeks   Status Achieved   PT LONG TERM GOAL #3   Title Patient will improve Dynamic Gait Index (DGI) score to > 20/24 for low falls risk regarding dynamic walking tasks    Baseline 23/24   Time 6   Period Weeks   Status Achieved   PT LONG TERM GOAL #4   Title Patient will increase ABC Scale score to > or = 67% demonstrating a decreased risk for falls    Baseline 87.5%   Time 6   Period Weeks   Status Achieved   PT LONG TERM GOAL #5   Title Pt. will complete sit to stand 10x in <20 seconds to demonstate improved LE strength/ muscle endurance.    Baseline 19.78 sec.  Time 4   Period Weeks   Status Achieved   PT LONG TERM GOAL #6   Title Pt. able to participate with a 30 minutes ther.ex. program to promote progression to gym based/ HEP to maintain strength/functional gains.     Baseline Depends on asthma flare-ups.   Time 4   Period Weeks   Status Achieved               Plan - 10/11/14 5003    Clinical Impression Statement Pt. has progressed well towards all PT goals.  Pt. is primarily limited by asthma flare-ups on a weekly basis recently.  Pt. ambulates with marked improvement and no LOB.  Discharge from PT at this time with focus on walking/ HEP.     Pt will benefit from skilled therapeutic intervention in order to improve on the following deficits Decreased endurance;Improper body mechanics;Decreased strength;Postural dysfunction;Decreased balance;Decreased mobility;Difficulty walking;Pain;Decreased range of motion   PT  Frequency 2x / week   PT Duration 6 weeks   PT Treatment/Interventions ADLs/Self Care Home Management;Gait training;Neuromuscular re-education;Stair training;Patient/family education;Functional mobility training;Therapeutic activities;Therapeutic exercise;Manual techniques;Balance training;Moist Heat;Energy conservation   PT Next Visit Plan Discharge from PT at this time.  Pt. will continue wtih walking/ HEP   PT Home Exercise Plan Continue current HEP.   Consulted and Agree with Plan of Care Patient          G-Codes - 11-Oct-2014 0944    Functional Limitation Mobility: Walking and moving around   Mobility: Walking and Moving Around Current Status [phone]) At least 1 percent but less than 20 percent impaired, limited or restricted   Mobility: Walking and Moving Around Goal Status [phone]) At least 1 percent but less than 20 percent impaired, limited or restricted   Mobility: Walking and Moving Around Discharge Status [phone]) At least 1 percent but less than 20 percent impaired, limited or restricted      Problem List There are no active problems to display for this patient.   Pura Spice, PT, DPT # [phone]   10/11/14, 9:45 AM  [place] [facility] [address] [place], [place], [address] Phone: [phone]   Fax:  [phone]

## 2014-10-10 ENCOUNTER — Ambulatory Visit: Payer: Medicare Other | Admitting: Physical Therapy

## 2015-03-14 DIAGNOSIS — G44319 Acute post-traumatic headache, not intractable: Secondary | ICD-10-CM | POA: Insufficient documentation

## 2015-06-24 ENCOUNTER — Other Ambulatory Visit: Payer: Self-pay | Admitting: Family Medicine

## 2015-06-24 DIAGNOSIS — Z1231 Encounter for screening mammogram for malignant neoplasm of breast: Secondary | ICD-10-CM

## 2015-06-24 DIAGNOSIS — M810 Age-related osteoporosis without current pathological fracture: Secondary | ICD-10-CM

## 2015-07-15 ENCOUNTER — Ambulatory Visit: Payer: Medicare Other

## 2015-07-18 DIAGNOSIS — R4701 Aphasia: Secondary | ICD-10-CM | POA: Insufficient documentation

## 2015-07-18 DIAGNOSIS — R2681 Unsteadiness on feet: Secondary | ICD-10-CM | POA: Insufficient documentation

## 2015-07-18 DIAGNOSIS — M542 Cervicalgia: Secondary | ICD-10-CM | POA: Insufficient documentation

## 2015-08-07 ENCOUNTER — Ambulatory Visit
Admission: RE | Admit: 2015-08-07 | Discharge: 2015-08-07 | Disposition: A | Discharge status: Home / Self Care | Payer: Medicare Other | Source: Ambulatory Visit | Attending: Family Medicine | Admitting: Family Medicine

## 2015-08-07 ENCOUNTER — Other Ambulatory Visit: Payer: Medicare Other

## 2015-08-07 ENCOUNTER — Ambulatory Visit: Payer: Medicare Other

## 2015-08-07 DIAGNOSIS — Z780 Asymptomatic menopausal state: Secondary | ICD-10-CM | POA: Diagnosis not present

## 2015-08-07 DIAGNOSIS — M810 Age-related osteoporosis without current pathological fracture: Secondary | ICD-10-CM | POA: Diagnosis not present

## 2015-08-07 DIAGNOSIS — Z1231 Encounter for screening mammogram for malignant neoplasm of breast: Secondary | ICD-10-CM

## 2015-12-15 ENCOUNTER — Ambulatory Visit
Admission: EM | Admit: 2015-12-15 | Discharge: 2015-12-15 | Disposition: A | Discharge status: Home / Self Care | Payer: Medicare Other | Attending: Family Medicine | Admitting: Family Medicine

## 2015-12-15 DIAGNOSIS — H5712 Ocular pain, left eye: Secondary | ICD-10-CM | POA: Diagnosis not present

## 2015-12-15 HISTORY — DX: Unspecified injury of head, initial encounter: S09.90XA

## 2015-12-15 HISTORY — DX: Unspecified asthma, uncomplicated: J45.909

## 2015-12-15 MED ORDER — POLYMYXIN B-TRIMETHOPRIM 10000-0.1 UNIT/ML-% OP SOLN
1.0000 [drp] | Freq: Four times a day (QID) | OPHTHALMIC | 0 refills | Status: DC
Start: 2015-12-15 — End: 2019-11-03

## 2015-12-15 NOTE — ED Triage Notes (Signed)
Pt awoke with sty on left eyelid and redness/soreness 4/10. No drainage and no visual disturbance

## 2015-12-15 NOTE — Discharge Instructions (Signed)
Warm compresses frequently  Use the eye drop as directed.  Follow up with an eye doctor this week.  Take care  Dr. Lacinda Axon

## 2015-12-15 NOTE — ED Provider Notes (Signed)
[facility]    CSN: [id number] Arrival date & time: 12/15/15  1308  First Provider Contact:  First MD Initiated Contact with Patient 12/15/15 1330      History   Chief Complaint Chief Complaint  Patient presents with  . Eye Pain   Established patient - Encounter with K. Posey Pronto (10/2013).  HPI 69 year old female presents with complaints of left eye pain and swelling.  Patient states that for the past few days she's had soreness and some mild swelling around her left eye, particularly the lateral aspect. This morning she noticed a white area/lesion of the lower eyelid and had an increase in pain. No changes in vision. No associated fevers or chills. No drainage from the eye. No reports of photophobia. No medications or interventions tried. No known exacerbating or relieving factors. No other complaints at this time.  Past Medical History:  Diagnosis Date  . Anxiety   . Asthma   . Depression   . Head injury   . Osteoporosis    Past Surgical History:  Procedure Laterality Date  . ABDOMINAL HYSTERECTOMY    . HEMORROIDECTOMY    . SEPTOPLASTY     OB History    No data available     Home Medications    Prior to Admission medications   Medication Sig Start Date End Date Taking? Authorizing Provider  albuterol (PROVENTIL HFA;VENTOLIN HFA) 108 (90 BASE) MCG/ACT inhaler Inhale into the lungs every 6 (six) hours as needed for wheezing or shortness of breath.   Yes Historical Provider, MD  amitriptyline (ELAVIL) 10 MG tablet Take 10 mg by mouth at bedtime.   Yes Historical Provider, MD  atorvastatin (LIPITOR) 20 MG tablet Take 20 mg by mouth daily.   Yes Historical Provider, MD  busPIRone (BUSPAR) 10 MG tablet Take 10 mg by mouth 3 (three) times daily.   Yes Historical Provider, MD  montelukast (SINGULAIR) 10 MG tablet Take 10 mg by mouth at bedtime.   Yes Historical Provider, MD  naproxen (NAPROSYN) 500 MG tablet Take 500 mg by mouth 2 (two) times daily with a meal.    Yes Historical Provider, MD  omeprazole (PRILOSEC) 40 MG capsule Take 40 mg by mouth daily.   Yes Historical Provider, MD  ranitidine (ZANTAC) 150 MG tablet Take 150 mg by mouth 2 (two) times daily.   Yes Historical Provider, MD  sertraline (ZOLOFT) 100 MG tablet Take 100 mg by mouth daily.   Yes Historical Provider, MD  traZODone (DESYREL) 100 MG tablet Take 100 mg by mouth at bedtime.   Yes Historical Provider, MD  Vitamin D, Ergocalciferol, (DRISDOL) 50000 UNITS CAPS capsule Take 50,000 Units by mouth every 7 (seven) days.   Yes Historical Provider, MD  ibuprofen (ADVIL,MOTRIN) 600 MG tablet Take 600 mg by mouth every 6 (six) hours as needed.    Historical Provider, MD  mometasone-formoterol (DULERA) 100-5 MCG/ACT AERO Inhale 2 puffs into the lungs 2 (two) times daily.    Historical Provider, MD  trimethoprim-polymyxin b (POLYTRIM) ophthalmic solution Place 1 drop into the left eye every 6 (six) hours. 12/15/15   Coral Spikes, DO   Family History Family History  Problem Relation Age of Onset  . Breast cancer Cousin     mat cousin   Social History Social History  Substance Use Topics  . Smoking status: Never Smoker  . Smokeless tobacco: Never Used  . Alcohol use No   Allergies   Levofloxacin  Review of Systems Review of Systems  Constitutional: Negative.   Eyes: Positive for pain. Negative for photophobia, discharge and visual disturbance.   Physical Exam Triage Vital Signs ED Triage Vitals  Enc Vitals Group     BP 12/15/15 1317 (!) 125/57     Pulse Rate 12/15/15 1317 86     Resp 12/15/15 1317 20     Temp 12/15/15 1317 98.3 F (36.8 C)     Temp Source 12/15/15 1317 Oral     SpO2 12/15/15 1317 98 %     Weight --      Height --      Head Circumference --      Peak Flow --      Pain Score 12/15/15 1323 1     Pain Loc --      Pain Edu? --      Excl. in [place]? --    Updated Vital Signs BP (!) 125/57 (BP Location: Left Arm)   Pulse 86   Temp 98.3 F (36.8 C) (Oral)    Resp 20   SpO2 98%   Visual Acuity Right Eye Distance:  (20/25 corrected) Left Eye Distance:  (20/25 corrected) Bilateral Distance:  (20/25 corrected)   Physical Exam  Constitutional: She is oriented to person, place, and time. She appears well-developed. No distress.  HENT:  Head: Normocephalic and atraumatic.  Eyes: Conjunctivae are normal. Pupils are equal, round, and reactive to light. Right eye exhibits no discharge. Left eye exhibits no discharge.  Left lower eyelid with redness and mild swelling laterally. White raised lesion/punctum noted.  Cardiovascular: Normal rate and regular rhythm.   Pulmonary/Chest: Effort normal. No respiratory distress. She has no wheezes. She has no rales.  Neurological: She is alert and oriented to person, place, and time.  Psychiatric: She has a normal mood and affect.  Vitals reviewed.  UC Treatments / Results  Labs (all labs ordered are listed, but only abnormal results are displayed) Labs Reviewed - No data to display  EKG  EKG Interpretation None       Radiology No results found.  Procedures Procedures (including critical care time)  Medications Ordered in UC Medications - No data to display   Initial Impression / Assessment and Plan / UC Course  I have reviewed the triage vital signs and the nursing notes.  Pertinent labs & imaging results that were available during my care of the patient were reviewed by me and considered in my medical decision making (see chart for details).  Patient presenting with pain and redness/swelling particularly laterally. White lower eyelid lesion noted. No evidence of conjunctivitis. This appears more likely to be a benign hordeolum. Treating with warm compresses and Polytrim.  Final Clinical Impressions(s) / UC Diagnoses   Final diagnoses:  Eye pain, left   New Prescriptions Discharge Medication List as of 12/15/2015  1:38 PM    START taking these medications   Details    trimethoprim-polymyxin b (POLYTRIM) ophthalmic solution Place 1 drop into the left eye every 6 (six) hours., Starting Sun 12/15/2015, [place], DO 12/15/15 1346

## 2016-06-19 ENCOUNTER — Encounter: Payer: Self-pay | Admitting: Emergency Medicine

## 2016-06-19 ENCOUNTER — Ambulatory Visit
Admission: EM | Admit: 2016-06-19 | Discharge: 2016-06-19 | Disposition: A | Discharge status: Home / Self Care | Payer: Medicare HMO | Attending: Family Medicine | Admitting: Family Medicine

## 2016-06-19 DIAGNOSIS — J4521 Mild intermittent asthma with (acute) exacerbation: Secondary | ICD-10-CM | POA: Diagnosis not present

## 2016-06-19 MED ORDER — PREDNISONE 20 MG PO TABS: ORAL_TABLET | ORAL | 0 refills | Status: DC

## 2016-06-19 MED ORDER — AMOXICILLIN-POT CLAVULANATE 875-125 MG PO TABS: 1.0000 | ORAL_TABLET | Freq: Two times a day (BID) | ORAL | 0 refills | Status: DC

## 2016-06-19 NOTE — ED Provider Notes (Signed)
[facility]    CSN: [id number] Arrival date & time: 06/19/16  1601     History   Chief Complaint Chief Complaint  Patient presents with  . Shortness of Breath    HPI Laurie [name] is a 71 y.o. female.   The history is provided by the patient.  Shortness of Breath  Associated symptoms: cough and wheezing   URI  Presenting symptoms: congestion, cough and rhinorrhea   Severity:  Moderate Onset quality:  Sudden Duration:  3 days Timing:  Constant Progression:  Worsening Chronicity:  New Relieved by:  Nothing Ineffective treatments:  Prescription medications Associated symptoms: wheezing   Risk factors: chronic respiratory disease (asthma) and sick contacts   Risk factors: not elderly, no chronic cardiac disease, no chronic kidney disease, no diabetes mellitus, no immunosuppression, no recent illness and no recent travel     Past Medical History:  Diagnosis Date  . Anxiety   . Asthma   . Depression   . Head injury   . Osteoporosis     There are no active problems to display for this patient.   Past Surgical History:  Procedure Laterality Date  . ABDOMINAL HYSTERECTOMY    . HEMORROIDECTOMY    . SEPTOPLASTY      OB History    No data available       Home Medications    Prior to Admission medications   Medication Sig Start Date End Date Taking? Authorizing Provider  albuterol (PROVENTIL HFA;VENTOLIN HFA) 108 (90 BASE) MCG/ACT inhaler Inhale into the lungs every 6 (six) hours as needed for wheezing or shortness of breath.    Historical Provider, MD  amitriptyline (ELAVIL) 10 MG tablet Take 10 mg by mouth at bedtime.    Historical Provider, MD  amoxicillin-clavulanate (AUGMENTIN) 875-125 MG tablet Take 1 tablet by mouth 2 (two) times daily. 06/19/16   Norval Gable, MD  atorvastatin (LIPITOR) 20 MG tablet Take 20 mg by mouth daily.    Historical Provider, MD  busPIRone (BUSPAR) 10 MG tablet Take 10 mg by mouth 3 (three) times daily.     Historical Provider, MD  ibuprofen (ADVIL,MOTRIN) 600 MG tablet Take 600 mg by mouth every 6 (six) hours as needed.    Historical Provider, MD  mometasone-formoterol (DULERA) 100-5 MCG/ACT AERO Inhale 2 puffs into the lungs 2 (two) times daily.    Historical Provider, MD  montelukast (SINGULAIR) 10 MG tablet Take 10 mg by mouth at bedtime.    Historical Provider, MD  naproxen (NAPROSYN) 500 MG tablet Take 500 mg by mouth 2 (two) times daily with a meal.    Historical Provider, MD  omeprazole (PRILOSEC) 40 MG capsule Take 40 mg by mouth daily.    Historical Provider, MD  predniSONE (DELTASONE) 20 MG tablet 3 tabs po qd for 2 days, then 2 tabs po qd for 3 days, then 1 tab po qd for 3 days, then half a tab po qd for 2 days 06/19/16   Norval Gable, MD  ranitidine (ZANTAC) 150 MG tablet Take 150 mg by mouth 2 (two) times daily.    Historical Provider, MD  sertraline (ZOLOFT) 100 MG tablet Take 100 mg by mouth daily.    Historical Provider, MD  traZODone (DESYREL) 100 MG tablet Take 100 mg by mouth at bedtime.    Historical Provider, MD  trimethoprim-polymyxin b (POLYTRIM) ophthalmic solution Place 1 drop into the left eye every 6 (six) hours. 12/15/15   Coral Spikes, DO  Vitamin D,  Ergocalciferol, (DRISDOL) 50000 UNITS CAPS capsule Take 50,000 Units by mouth every 7 (seven) days.    Historical Provider, MD    Family History Family History  Problem Relation Age of Onset  . Breast cancer Cousin     mat cousin    Social History Social History  Substance Use Topics  . Smoking status: Never Smoker  . Smokeless tobacco: Never Used  . Alcohol use No     Allergies   Levofloxacin   Review of Systems Review of Systems  HENT: Positive for congestion and rhinorrhea.   Respiratory: Positive for cough, shortness of breath and wheezing.      Physical Exam Triage Vital Signs ED Triage Vitals  Enc Vitals Group     BP 06/19/16 1741 (!) 149/95     Pulse Rate 06/19/16 1741 (!) 112     Resp  06/19/16 1741 16     Temp 06/19/16 1741 98.2 F (36.8 C)     Temp Source 06/19/16 1741 Oral     SpO2 06/19/16 1741 98 %     Weight 06/19/16 1740 214 lb (97.1 kg)     Height 06/19/16 1740 5\' 5"  (1.651 m)     Head Circumference --      Peak Flow --      Pain Score 06/19/16 1741 1     Pain Loc --      Pain Edu? --      Excl. in [place]? --    No data found.   Updated Vital Signs BP (!) 149/95 (BP Location: Left Arm)   Pulse (!) 112   Temp 98.2 F (36.8 C) (Oral)   Resp 16   Ht 5\' 5"  (1.651 m)   Wt 214 lb (97.1 kg)   SpO2 98%   BMI 35.61 kg/m   Visual Acuity Right Eye Distance:   Left Eye Distance:   Bilateral Distance:    Right Eye Near:   Left Eye Near:    Bilateral Near:     Physical Exam  Constitutional: She appears well-developed and well-nourished. No distress.  HENT:  Head: Normocephalic and atraumatic.  Right Ear: Tympanic membrane, external ear and ear canal normal.  Left Ear: Tympanic membrane, external ear and ear canal normal.  Nose: Mucosal edema and rhinorrhea present. No nose lacerations, sinus tenderness, nasal deformity, septal deviation or nasal septal hematoma. No epistaxis.  No foreign bodies. Right sinus exhibits maxillary sinus tenderness and frontal sinus tenderness. Left sinus exhibits maxillary sinus tenderness and frontal sinus tenderness.  Mouth/Throat: Uvula is midline, oropharynx is clear and moist and mucous membranes are normal. No oropharyngeal exudate.  Eyes: Conjunctivae and EOM are normal. Pupils are equal, round, and reactive to light. Right eye exhibits no discharge. Left eye exhibits no discharge. No scleral icterus.  Neck: Normal range of motion. Neck supple. No thyromegaly present.  Cardiovascular: Normal rate, regular rhythm and normal heart sounds.   Pulmonary/Chest: Effort normal. No respiratory distress. She has wheezes (mild, diffuse). She has rales (left base).  Lymphadenopathy:    She has no cervical adenopathy.  Skin: She is  not diaphoretic.  Nursing note and vitals reviewed.    UC Treatments / Results  Labs (all labs ordered are listed, but only abnormal results are displayed) Labs Reviewed - No data to display  EKG  EKG Interpretation None       Radiology No results found.  Procedures Procedures (including critical care time)  Medications Ordered in UC Medications - No data to  display   Initial Impression / Assessment and Plan / UC Course  I have reviewed the triage vital signs and the nursing notes.  Pertinent labs & imaging results that were available during my care of the patient were reviewed by me and considered in my medical decision making (see chart for details).      Final Clinical Impressions(s) / UC Diagnoses   Final diagnoses:  Mild intermittent asthmatic bronchitis with acute exacerbation    New Prescriptions Discharge Medication List as of 06/19/2016  6:35 PM    START taking these medications   Details  amoxicillin-clavulanate (AUGMENTIN) 875-125 MG tablet Take 1 tablet by mouth 2 (two) times daily., Starting Fri 06/19/2016, Normal    predniSONE (DELTASONE) 20 MG tablet 3 tabs po qd for 2 days, then 2 tabs po qd for 3 days, then 1 tab po qd for 3 days, then half a tab po qd for 2 days, Normal       1.diagnosis reviewed with patient 2. rx as per orders above; reviewed possible side effects, interactions, risks and benefits  3. Recommend supportive treatment with rest, increased fluids 4. Follow-up prn if symptoms worsen or don't improve   Norval Gable, MD 06/19/16 1842

## 2016-06-19 NOTE — ED Triage Notes (Signed)
Patient c/o SOB off and on for 2-3 days.  Patient reports history of asthma. Patient reports some runny nose.  Patient denies fevers.

## 2016-06-23 ENCOUNTER — Ambulatory Visit
Admission: EM | Admit: 2016-06-23 | Discharge: 2016-06-23 | Disposition: A | Discharge status: Home / Self Care | Payer: Medicare HMO | Attending: Family Medicine | Admitting: Family Medicine

## 2016-06-23 ENCOUNTER — Ambulatory Visit (INDEPENDENT_AMBULATORY_CARE_PROVIDER_SITE_OTHER): Payer: Medicare HMO

## 2016-06-23 ENCOUNTER — Encounter: Payer: Self-pay | Admitting: Emergency Medicine

## 2016-06-23 DIAGNOSIS — J4521 Mild intermittent asthma with (acute) exacerbation: Secondary | ICD-10-CM | POA: Diagnosis not present

## 2016-06-23 NOTE — ED Provider Notes (Signed)
[facility]    CSN: [id number] Arrival date & time: 06/23/16  1446     History   Chief Complaint Chief Complaint  Patient presents with  . Cough    HPI Laurie [name] is a 71 y.o. female.   72 yo female with a h/o asthma seen here 4 days ago and diagnosed with asthmatic bronchitis, here today with c/o not feeling much better. States still coughing; cough is dry. Denies any fevers, chills, wheezing. States only very slightly better.    The history is provided by the patient.  Cough  Cough characteristics:  Dry   Past Medical History:  Diagnosis Date  . Anxiety   . Asthma   . Depression   . Head injury   . Osteoporosis     There are no active problems to display for this patient.   Past Surgical History:  Procedure Laterality Date  . ABDOMINAL HYSTERECTOMY    . HEMORROIDECTOMY    . SEPTOPLASTY      OB History    No data available       Home Medications    Prior to Admission medications   Medication Sig Start Date End Date Taking? Authorizing Provider  albuterol (PROVENTIL HFA;VENTOLIN HFA) 108 (90 BASE) MCG/ACT inhaler Inhale into the lungs every 6 (six) hours as needed for wheezing or shortness of breath.    Historical Provider, MD  amitriptyline (ELAVIL) 10 MG tablet Take 10 mg by mouth at bedtime.    Historical Provider, MD  amoxicillin-clavulanate (AUGMENTIN) 875-125 MG tablet Take 1 tablet by mouth 2 (two) times daily. 06/19/16   Norval Gable, MD  atorvastatin (LIPITOR) 20 MG tablet Take 20 mg by mouth daily.    Historical Provider, MD  busPIRone (BUSPAR) 10 MG tablet Take 10 mg by mouth 3 (three) times daily.    Historical Provider, MD  ibuprofen (ADVIL,MOTRIN) 600 MG tablet Take 600 mg by mouth every 6 (six) hours as needed.    Historical Provider, MD  mometasone-formoterol (DULERA) 100-5 MCG/ACT AERO Inhale 2 puffs into the lungs 2 (two) times daily.    Historical Provider, MD  montelukast (SINGULAIR) 10 MG tablet Take 10 mg by mouth  at bedtime.    Historical Provider, MD  naproxen (NAPROSYN) 500 MG tablet Take 500 mg by mouth 2 (two) times daily with a meal.    Historical Provider, MD  omeprazole (PRILOSEC) 40 MG capsule Take 40 mg by mouth daily.    Historical Provider, MD  predniSONE (DELTASONE) 20 MG tablet 3 tabs po qd for 2 days, then 2 tabs po qd for 3 days, then 1 tab po qd for 3 days, then half a tab po qd for 2 days 06/19/16   Norval Gable, MD  ranitidine (ZANTAC) 150 MG tablet Take 150 mg by mouth 2 (two) times daily.    Historical Provider, MD  sertraline (ZOLOFT) 100 MG tablet Take 100 mg by mouth daily.    Historical Provider, MD  traZODone (DESYREL) 100 MG tablet Take 100 mg by mouth at bedtime.    Historical Provider, MD  trimethoprim-polymyxin b (POLYTRIM) ophthalmic solution Place 1 drop into the left eye every 6 (six) hours. 12/15/15   Coral Spikes, DO  Vitamin D, Ergocalciferol, (DRISDOL) 50000 UNITS CAPS capsule Take 50,000 Units by mouth every 7 (seven) days.    Historical Provider, MD    Family History Family History  Problem Relation Age of Onset  . Breast cancer Cousin     mat  cousin    Social History Social History  Substance Use Topics  . Smoking status: Never Smoker  . Smokeless tobacco: Never Used  . Alcohol use No     Allergies   Levofloxacin   Review of Systems Review of Systems  Respiratory: Positive for cough.      Physical Exam Triage Vital Signs ED Triage Vitals  Enc Vitals Group     BP 06/23/16 1616 (!) 144/84     Pulse Rate 06/23/16 1616 94     Resp 06/23/16 1616 16     Temp 06/23/16 1616 98.4 F (36.9 C)     Temp Source 06/23/16 1616 Oral     SpO2 06/23/16 1616 97 %     Weight 06/23/16 1615 214 lb (97.1 kg)     Height 06/23/16 1615 5\' 6"  (1.676 m)     Head Circumference --      Peak Flow --      Pain Score 06/23/16 1615 4     Pain Loc --      Pain Edu? --      Excl. in [place]? --    No data found.   Updated Vital Signs BP (!) 144/84 (BP Location: Left  Arm)   Pulse 94   Temp 98.4 F (36.9 C) (Oral)   Resp 16   Ht 5\' 6"  (1.676 m)   Wt 214 lb (97.1 kg)   SpO2 97%   BMI 34.54 kg/m   Visual Acuity Right Eye Distance:   Left Eye Distance:   Bilateral Distance:    Right Eye Near:   Left Eye Near:    Bilateral Near:     Physical Exam  Constitutional: She appears well-developed and well-nourished. No distress.  HENT:  Head: Normocephalic and atraumatic.  Right Ear: Tympanic membrane, external ear and ear canal normal.  Left Ear: Tympanic membrane, external ear and ear canal normal.  Nose: Mucosal edema and rhinorrhea present. No nose lacerations, sinus tenderness, nasal deformity, septal deviation or nasal septal hematoma. No epistaxis.  No foreign bodies. Right sinus exhibits no maxillary sinus tenderness and no frontal sinus tenderness. Left sinus exhibits no maxillary sinus tenderness and no frontal sinus tenderness.  Mouth/Throat: Uvula is midline, oropharynx is clear and moist and mucous membranes are normal. No oropharyngeal exudate.  Eyes: Conjunctivae and EOM are normal. Pupils are equal, round, and reactive to light. Right eye exhibits no discharge. Left eye exhibits no discharge. No scleral icterus.  Neck: Normal range of motion. Neck supple. No thyromegaly present.  Cardiovascular: Normal rate, regular rhythm and normal heart sounds.   Pulmonary/Chest: Effort normal and breath sounds normal. No respiratory distress. She has no wheezes. She has no rales.  Lymphadenopathy:    She has no cervical adenopathy.  Skin: She is not diaphoretic.  Nursing note and vitals reviewed.    UC Treatments / Results  Labs (all labs ordered are listed, but only abnormal results are displayed) Labs Reviewed - No data to display  EKG  EKG Interpretation None       Radiology Dg Chest 2 View  Result Date: 06/23/2016 CLINICAL DATA:  Cough and shortness of breath for several days. EXAM: CHEST  2 VIEW COMPARISON:  Single-view of the  chest 11/20/2013. CT chest 12/31/2012. FINDINGS: The lungs are clear. Heart size is upper normal. No pneumothorax or pleural fluid. No acute bony abnormality. IMPRESSION: No acute disease. Electronically Signed   By: Inge Rise M.D.   On: 06/23/2016 16:39  Procedures Procedures (including critical care time)  Medications Ordered in UC Medications - No data to display   Initial Impression / Assessment and Plan / UC Course  I have reviewed the triage vital signs and the nursing notes.  Pertinent labs & imaging results that were available during my care of the patient were reviewed by me and considered in my medical decision making (see chart for details).       Final Clinical Impressions(s) / UC Diagnoses   Final diagnoses:  Mild intermittent asthmatic bronchitis with acute exacerbation    New Prescriptions Discharge Medication List as of 06/23/2016  5:13 PM     1. x-ray result (chest x-ray normal/negative) and diagnosis reviewed with patient 2. Recommend continue current antibiotic treatment and prednisone course 3. Recommend supportive treatment with increased fluids 4. Follow-up prn if symptoms worsen or don't improve   Norval Gable, MD 06/23/16 [phone]

## 2016-06-23 NOTE — ED Triage Notes (Signed)
Patient states that her cough and chest congestion has gotten worse.  Patient reports left sided rib pain.

## 2017-09-07 ENCOUNTER — Other Ambulatory Visit: Payer: Self-pay | Admitting: Family Medicine

## 2017-09-07 DIAGNOSIS — M810 Age-related osteoporosis without current pathological fracture: Secondary | ICD-10-CM

## 2017-09-07 DIAGNOSIS — Z1239 Encounter for other screening for malignant neoplasm of breast: Secondary | ICD-10-CM

## 2017-09-13 ENCOUNTER — Inpatient Hospital Stay: Admission: RE | Admit: 2017-09-13 | Payer: Medicare HMO | Source: Ambulatory Visit

## 2017-09-21 ENCOUNTER — Inpatient Hospital Stay: Admission: RE | Admit: 2017-09-21 | Payer: Medicare HMO | Source: Ambulatory Visit

## 2017-10-12 ENCOUNTER — Inpatient Hospital Stay: Admission: RE | Admit: 2017-10-12 | Payer: Medicare HMO | Source: Ambulatory Visit

## 2017-10-25 ENCOUNTER — Other Ambulatory Visit: Payer: Self-pay | Admitting: Family Medicine

## 2017-10-25 ENCOUNTER — Ambulatory Visit
Admission: RE | Admit: 2017-10-25 | Discharge: 2017-10-25 | Disposition: A | Discharge status: Home / Self Care | Payer: Medicare HMO | Source: Ambulatory Visit | Attending: Family Medicine | Admitting: Family Medicine

## 2017-10-25 DIAGNOSIS — M810 Age-related osteoporosis without current pathological fracture: Secondary | ICD-10-CM | POA: Diagnosis not present

## 2017-10-25 DIAGNOSIS — Z1239 Encounter for other screening for malignant neoplasm of breast: Secondary | ICD-10-CM

## 2018-02-05 ENCOUNTER — Emergency Department: Payer: Medicare HMO

## 2018-02-05 DIAGNOSIS — J40 Bronchitis, not specified as acute or chronic: Secondary | ICD-10-CM | POA: Insufficient documentation

## 2018-02-05 DIAGNOSIS — Z79899 Other long term (current) drug therapy: Secondary | ICD-10-CM | POA: Insufficient documentation

## 2018-02-05 DIAGNOSIS — S8991XA Unspecified injury of right lower leg, initial encounter: Secondary | ICD-10-CM | POA: Diagnosis present

## 2018-02-05 DIAGNOSIS — R0789 Other chest pain: Secondary | ICD-10-CM | POA: Diagnosis not present

## 2018-02-05 DIAGNOSIS — S8011XA Contusion of right lower leg, initial encounter: Secondary | ICD-10-CM | POA: Diagnosis not present

## 2018-02-05 DIAGNOSIS — Y998 Other external cause status: Secondary | ICD-10-CM | POA: Insufficient documentation

## 2018-02-05 DIAGNOSIS — Y9389 Activity, other specified: Secondary | ICD-10-CM | POA: Diagnosis not present

## 2018-02-05 DIAGNOSIS — Y92019 Unspecified place in single-family (private) house as the place of occurrence of the external cause: Secondary | ICD-10-CM | POA: Insufficient documentation

## 2018-02-05 DIAGNOSIS — W010XXA Fall on same level from slipping, tripping and stumbling without subsequent striking against object, initial encounter: Secondary | ICD-10-CM | POA: Diagnosis not present

## 2018-02-05 LAB — CBC WITH DIFFERENTIAL/PLATELET
Basophils Absolute: 0 10*3/uL (ref 0–0.1)
Basophils Relative: 0 %
Eosinophils Absolute: 0.2 10*3/uL (ref 0–0.7)
Eosinophils Relative: 4 %
HEMATOCRIT: 34 % — AB (ref 35.0–47.0)
Hemoglobin: 11.5 g/dL — ABNORMAL LOW (ref 12.0–16.0)
LYMPHS ABS: 1.3 10*3/uL (ref 1.0–3.6)
LYMPHS PCT: 23 %
MCH: 34.4 pg — AB (ref 26.0–34.0)
MCHC: 33.9 g/dL (ref 32.0–36.0)
MCV: 101.4 fL — AB (ref 80.0–100.0)
MONOS PCT: 7 %
Monocytes Absolute: 0.4 10*3/uL (ref 0.2–0.9)
Neutro Abs: 3.7 10*3/uL (ref 1.4–6.5)
Neutrophils Relative %: 66 %
PLATELETS: 323 10*3/uL (ref 150–440)
RBC: 3.35 MIL/uL — ABNORMAL LOW (ref 3.80–5.20)
RDW: 13.4 % (ref 11.5–14.5)
WBC: 5.6 10*3/uL (ref 3.6–11.0)

## 2018-02-05 LAB — COMPREHENSIVE METABOLIC PANEL
ALT: 18 U/L (ref 0–44)
AST: 23 U/L (ref 15–41)
Albumin: 3.8 g/dL (ref 3.5–5.0)
Alkaline Phosphatase: 70 U/L (ref 38–126)
Anion gap: 12 (ref 5–15)
BUN: 25 mg/dL — ABNORMAL HIGH (ref 8–23)
CHLORIDE: 104 mmol/L (ref 98–111)
CO2: 24 mmol/L (ref 22–32)
Calcium: 9.1 mg/dL (ref 8.9–10.3)
Creatinine, Ser: 0.95 mg/dL (ref 0.44–1.00)
GFR, EST NON AFRICAN AMERICAN: 59 mL/min — AB (ref >60)
Glucose, Bld: 132 mg/dL — ABNORMAL HIGH (ref 70–99)
POTASSIUM: 3.9 mmol/L (ref 3.5–5.1)
Sodium: 140 mmol/L (ref 135–145)
Total Bilirubin: 0.6 mg/dL (ref 0.3–1.2)
Total Protein: 8.8 g/dL — ABNORMAL HIGH (ref 6.5–8.1)

## 2018-02-05 LAB — TROPONIN I

## 2018-02-05 LAB — LACTIC ACID, PLASMA: LACTIC ACID, VENOUS: 1.2 mmol/L (ref 0.5–1.9)

## 2018-02-05 NOTE — ED Triage Notes (Signed)
Patient reports mechanical fall over dog leash this evening. Patient reports she feels like she may have cracked a rib. Patient has hx of osteopororsis. Patient reports her nieces that lives with her has community acquired pneumonia, and patient has a cough and asthma and feels she may be catching the pneumonia.

## 2018-02-06 ENCOUNTER — Emergency Department
Admission: EM | Admit: 2018-02-06 | Discharge: 2018-02-06 | Disposition: A | Discharge status: Home / Self Care | Payer: Medicare HMO | Attending: Emergency Medicine | Admitting: Emergency Medicine

## 2018-02-06 DIAGNOSIS — W19XXXA Unspecified fall, initial encounter: Secondary | ICD-10-CM

## 2018-02-06 DIAGNOSIS — R0789 Other chest pain: Secondary | ICD-10-CM

## 2018-02-06 DIAGNOSIS — S8011XA Contusion of right lower leg, initial encounter: Secondary | ICD-10-CM

## 2018-02-06 DIAGNOSIS — J40 Bronchitis, not specified as acute or chronic: Secondary | ICD-10-CM

## 2018-02-06 MED ORDER — PREDNISONE 20 MG PO TABS: 40.0000 mg | ORAL_TABLET | Freq: Every day | ORAL | 0 refills | Status: AC

## 2018-02-06 MED ORDER — AZITHROMYCIN 250 MG PO TABS: ORAL_TABLET | ORAL | 0 refills | Status: AC

## 2018-02-06 MED ORDER — AZITHROMYCIN 500 MG PO TABS
500.0000 mg | ORAL_TABLET | Freq: Once | ORAL | Status: AC
  Administered 2018-02-06: 500 mg via ORAL
  Filled 2018-02-06: qty 1

## 2018-02-06 MED ORDER — PREDNISONE 20 MG PO TABS
40.0000 mg | ORAL_TABLET | Freq: Once | ORAL | Status: AC
  Administered 2018-02-06: 40 mg via ORAL
  Filled 2018-02-06: qty 2

## 2018-02-06 NOTE — ED Provider Notes (Signed)
[facility] Emergency Department Provider Note  ___________________________________________   First MD Initiated Contact with Patient 02/06/18 0222     (approximate)  I have reviewed the triage vital signs and the nursing notes.   HISTORY  Chief Complaint Fall and Chest Pain   HPI Laurie [name] is a 72 y.o. female with history of asthma was presented to the emergency department after a fall.  She also planing of several days of persistent cough despite using a steroid as well as albuterol inhaler.  She states that she tripped over dog leash and fell onto her right knee.  She says that she also felt a pop in her left thoracic wall and was concerned that she broke a rib in addition to injuring her right knee and shin.  She states that she did not hit her head.  Denies being on blood thinners.  States the multiple other people in the home are sick with "bronchial pneumonia."  Denying any central chest pain at this time.   Past Medical History:  Diagnosis Date  . Anxiety   . Asthma   . Depression   . Head injury   . Osteoporosis     There are no active problems to display for this patient.   Past Surgical History:  Procedure Laterality Date  . ABDOMINAL HYSTERECTOMY    . HEMORROIDECTOMY    . SEPTOPLASTY      Prior to Admission medications   Medication Sig Start Date End Date Taking? Authorizing Provider  albuterol (PROVENTIL HFA;VENTOLIN HFA) 108 (90 BASE) MCG/ACT inhaler Inhale into the lungs every 6 (six) hours as needed for wheezing or shortness of breath.    [provider]  amitriptyline (ELAVIL) 10 MG tablet Take 10 mg by mouth at bedtime.    [provider]  amoxicillin-clavulanate (AUGMENTIN) 875-125 MG tablet Take 1 tablet by mouth 2 (two) times daily. 06/19/16   Norval Gable, MD  atorvastatin (LIPITOR) 20 MG tablet Take 20 mg by mouth daily.    [provider]  busPIRone (BUSPAR) 10 MG tablet Take 10 mg by  mouth 3 (three) times daily.    [provider]  ibuprofen (ADVIL,MOTRIN) 600 MG tablet Take 600 mg by mouth every 6 (six) hours as needed.    [provider]  mometasone-formoterol (DULERA) 100-5 MCG/ACT AERO Inhale 2 puffs into the lungs 2 (two) times daily.    [provider]  montelukast (SINGULAIR) 10 MG tablet Take 10 mg by mouth at bedtime.    [provider]  naproxen (NAPROSYN) 500 MG tablet Take 500 mg by mouth 2 (two) times daily with a meal.    [provider]  omeprazole (PRILOSEC) 40 MG capsule Take 40 mg by mouth daily.    [provider]  predniSONE (DELTASONE) 20 MG tablet 3 tabs po qd for 2 days, then 2 tabs po qd for 3 days, then 1 tab po qd for 3 days, then half a tab po qd for 2 days 06/19/16   Norval Gable, MD  ranitidine (ZANTAC) 150 MG tablet Take 150 mg by mouth 2 (two) times daily.    [provider]  sertraline (ZOLOFT) 100 MG tablet Take 100 mg by mouth daily.    [provider]  traZODone (DESYREL) 100 MG tablet Take 100 mg by mouth at bedtime.    [provider]  trimethoprim-polymyxin b (POLYTRIM) ophthalmic solution Place 1 drop into the left eye every 6 (six) hours. 12/15/15  Cook, Jayce G, DO  Vitamin D, Ergocalciferol, (DRISDOL) 50000 UNITS CAPS capsule Take 50,000 Units by mouth every 7 (seven) days.    [provider]    Allergies Levofloxacin  Family History  Problem Relation Age of Onset  . Breast cancer Cousin        mat cousin    Social History Social History   Tobacco Use  . Smoking status: Never Smoker  . Smokeless tobacco: Never Used  Substance Use Topics  . Alcohol use: No  . Drug use: No    Review of Systems  Constitutional: No fever/chills Eyes: No visual changes. ENT: No sore throat. Cardiovascular: As above Respiratory: As above Gastrointestinal: No abdominal pain.  No nausea, no vomiting.  No diarrhea.  No constipation. Genitourinary:  Negative for dysuria. Musculoskeletal: Negative for back pain. Skin: Negative for rash. Neurological: Negative for headaches, focal weakness or numbness.   ____________________________________________   PHYSICAL EXAM:  VITAL SIGNS: ED Triage Vitals  Enc Vitals Group     BP 02/05/18 2218 (!) 156/85     Pulse Rate 02/05/18 2218 (!) 109     Resp 02/05/18 2218 (!) 22     Temp 02/05/18 2218 98.4 F (36.9 C)     Temp Source 02/05/18 2218 Oral     SpO2 02/05/18 2218 99 %     Weight 02/05/18 2219 213 lb 13.5 oz (97 kg)     Height --      Head Circumference --      Peak Flow --      Pain Score 02/05/18 2218 2     Pain Loc --      Pain Edu? --      Excl. in [place]? --     Constitutional: Alert and oriented. Well appearing and in no acute distress. Eyes: Conjunctivae are normal.  Head: Atraumatic. Nose: No congestion/rhinnorhea. Mouth/Throat: Mucous membranes are moist.  Neck: No stridor.   Cardiovascular: Normal rate, regular rhythm. Grossly normal heart sounds.  Minimal tenderness left lateral thoracic wall without any ecchymosis.  No deformity.  No crepitus.  No point tenderness. Respiratory: Normal respiratory effort.  No retractions. Lungs CTAB.  Expiratory cough is present.  Minimal wheezing with coughing but when patient is not coughing the lungs are clear throughout. Gastrointestinal: Soft and nontender. No distention.  Musculoskeletal:   Full active range of motion of the bilateral lower extremities.  No joint effusions.  To the proximal left anterior tibia there is a mild amount of ecchymosis as well as tenderness.  However, no deformity and patient has full, 5 out of 5, active range of motion.    Neurologic:  Normal speech and language. No gross focal neurologic deficits are appreciated. Skin:  Skin is warm, dry and intact. No rash noted. Psychiatric: Mood and affect are normal. Speech and behavior are normal.  ____________________________________________   LABS (all  labs ordered are listed, but only abnormal results are displayed)  Labs Reviewed  COMPREHENSIVE METABOLIC PANEL - Abnormal; Notable for the following components:      Result Value   Glucose, Bld 132 (*)    BUN 25 (*)    Total Protein 8.8 (*)    GFR calc non Af Amer 59 (*)    All other components within normal limits  CBC WITH DIFFERENTIAL/PLATELET - Abnormal; Notable for the following components:   RBC 3.35 (*)    Hemoglobin 11.5 (*)    HCT 34.0 (*)    MCV 101.4 (*)  MCH 34.4 (*)    All other components within normal limits  CULTURE, BLOOD (ROUTINE X 2)  CULTURE, BLOOD (ROUTINE X 2)  TROPONIN I  LACTIC ACID, PLASMA  LACTIC ACID, PLASMA   ____________________________________________  EKG  ED ECG REPORT I, Doran Stabler, the attending physician, personally viewed and interpreted this ECG.   Date: 02/06/2018  EKG Time: 2222  Rate: 109  Rhythm: sinus tachycardia  Axis: Normal  Intervals:none  ST&T Change: No ST segment elevation or depression.  No abnormal T wave inversion.  ____________________________________________  RADIOLOGY  No acute finding of the chest x-ray nor the right tib-fib x-ray. ____________________________________________   PROCEDURES  Procedure(s) performed:   Procedures  Critical Care performed:   ____________________________________________   INITIAL IMPRESSION / ASSESSMENT AND PLAN / ED COURSE  Pertinent labs & imaging results that were available during my care of the patient were reviewed by me and considered in my medical decision making (see chart for details).  Differential diagnosis includes, but is not limited to, ACS, aortic dissection, pulmonary embolism, cardiac tamponade, pneumothorax, pneumonia, pericarditis, myocarditis, GI-related causes including esophagitis/gastritis, and musculoskeletal chest wall pain.   As part of my medical decision making, I reviewed the following data within the electronic MEDICAL RECORD NUMBER  Notes from prior ED visits  Patient without any signs of serious internal injury.  Likely contusions from the mechanical fall.  Likely bronchopneumonia.  Will be treated with steroids as well as azithromycin.  Patient understanding the diagnosis as well as treatment and willing to comply.  We reviewed patient's imaging as well as lab results. ____________________________________________   FINAL CLINICAL IMPRESSION(S) / ED DIAGNOSES  Mechanical fall.  Bronchitis.  Chest wall pain.  Right tibial contusion.  NEW MEDICATIONS STARTED DURING THIS VISIT:  New Prescriptions   No medications on file     Note:  This document was prepared using Dragon voice recognition software and may include unintentional dictation errors.     Orbie Pyo, MD 02/06/18 [phone]

## 2018-02-10 LAB — CULTURE, BLOOD (ROUTINE X 2)
Culture: NO GROWTH
SPECIAL REQUESTS: ADEQUATE

## 2018-05-16 ENCOUNTER — Encounter: Payer: Self-pay | Admitting: Emergency Medicine

## 2018-05-16 ENCOUNTER — Ambulatory Visit (INDEPENDENT_AMBULATORY_CARE_PROVIDER_SITE_OTHER): Payer: Medicare HMO

## 2018-05-16 ENCOUNTER — Ambulatory Visit
Admission: EM | Admit: 2018-05-16 | Discharge: 2018-05-16 | Disposition: A | Discharge status: Home / Self Care | Payer: Medicare HMO | Attending: Family Medicine | Admitting: Family Medicine

## 2018-05-16 ENCOUNTER — Other Ambulatory Visit: Payer: Self-pay

## 2018-05-16 DIAGNOSIS — Y998 Other external cause status: Secondary | ICD-10-CM | POA: Diagnosis not present

## 2018-05-16 DIAGNOSIS — M79671 Pain in right foot: Secondary | ICD-10-CM

## 2018-05-16 DIAGNOSIS — S92354A Nondisplaced fracture of fifth metatarsal bone, right foot, initial encounter for closed fracture: Secondary | ICD-10-CM | POA: Diagnosis not present

## 2018-05-16 NOTE — ED Triage Notes (Signed)
Patient stated she stood up today and felt something pop in her right foot. She now has pain in her right foot.

## 2018-05-16 NOTE — ED Provider Notes (Signed)
[facility]    CSN: [id number] Arrival date & time: 05/16/18  1919     History   Chief Complaint Chief Complaint  Patient presents with  . Foot Pain    HPI Laurie [name] is a 73 y.o. female.   73 yo female with c/o right foot pain since earlier today when she stood up and "felt something pop" with subsequent pain. Patient has a h/o osteoporosis.   The history is provided by the patient.  Foot Pain     Past Medical History:  Diagnosis Date  . Anxiety   . Asthma   . Depression   . Head injury   . Osteoporosis     There are no active problems to display for this patient.   Past Surgical History:  Procedure Laterality Date  . ABDOMINAL HYSTERECTOMY    . HEMORROIDECTOMY    . SEPTOPLASTY      OB History   No obstetric history on file.      Home Medications    Prior to Admission medications   Medication Sig Start Date End Date Taking? Authorizing Provider  albuterol (PROVENTIL HFA;VENTOLIN HFA) 108 (90 BASE) MCG/ACT inhaler Inhale into the lungs every 6 (six) hours as needed for wheezing or shortness of breath.   Yes [provider]  alendronate (FOSAMAX) 70 MG tablet  11/21/15  Yes [provider]  amitriptyline (ELAVIL) 10 MG tablet Take 10 mg by mouth at bedtime.   Yes [provider]  atorvastatin (LIPITOR) 20 MG tablet Take 20 mg by mouth daily.   Yes [provider]  busPIRone (BUSPAR) 10 MG tablet Take 10 mg by mouth 3 (three) times daily.   Yes [provider]  fluticasone-salmeterol (ADVAIR HFA) 230-21 MCG/ACT inhaler Inhale into the lungs. 01/28/17  Yes [provider]  ibuprofen (ADVIL,MOTRIN) 600 MG tablet Take 600 mg by mouth every 6 (six) hours as needed.   Yes [provider]  montelukast (SINGULAIR) 10 MG tablet Take 10 mg by mouth at bedtime.   Yes [provider]  naproxen (NAPROSYN) 500 MG tablet Take 500 mg by mouth 2 (two) times daily with a meal.   Yes  [provider]  nortriptyline (PAMELOR) 25 MG capsule Take by mouth. 05/26/16  Yes [provider]  omeprazole (PRILOSEC) 40 MG capsule Take 40 mg by mouth daily.   Yes [provider]  ranitidine (ZANTAC) 150 MG tablet Take 150 mg by mouth 2 (two) times daily.   Yes [provider]  sertraline (ZOLOFT) 100 MG tablet Take 100 mg by mouth daily.   Yes [provider]  traZODone (DESYREL) 100 MG tablet Take 100 mg by mouth at bedtime.   Yes [provider]  Vitamin D, Ergocalciferol, (DRISDOL) 50000 UNITS CAPS capsule Take 50,000 Units by mouth every 7 (seven) days.   Yes [provider]  amoxicillin-clavulanate (AUGMENTIN) 875-125 MG tablet Take 1 tablet by mouth 2 (two) times daily. 06/19/16   Norval Gable, MD  mometasone-formoterol (DULERA) 100-5 MCG/ACT AERO Inhale 2 puffs into the lungs 2 (two) times daily.    [provider]  trimethoprim-polymyxin b (POLYTRIM) ophthalmic solution Place 1 drop into the left eye every 6 (six) hours. 12/15/15   Coral Spikes, DO    Family History Family History  Problem Relation Age of Onset  . Breast cancer Cousin        mat cousin    Social History Social History   Tobacco Use  .  Smoking status: Never Smoker  . Smokeless tobacco: Never Used  Substance Use Topics  . Alcohol use: No  . Drug use: No     Allergies   Levofloxacin   Review of Systems Review of Systems   Physical Exam Triage Vital Signs ED Triage Vitals  Enc Vitals Group     BP 05/16/18 1951 (!) 159/94     Pulse Rate 05/16/18 1951 100     Resp 05/16/18 1951 18     Temp 05/16/18 1951 98 F (36.7 C)     Temp Source 05/16/18 1951 Oral     SpO2 05/16/18 1951 98 %     Weight 05/16/18 1948 208 lb (94.3 kg)     Height 05/16/18 1948 5\' 5"  (1.651 m)     Head Circumference --      Peak Flow --      Pain Score 05/16/18 1947 7     Pain Loc --      Pain Edu? --      Excl. in [place]? --    No data  found.  Updated Vital Signs BP (!) 159/94 (BP Location: Right Arm)   Pulse 100   Temp 98 F (36.7 C) (Oral)   Resp 18   Ht 5\' 5"  (1.651 m)   Wt 94.3 kg   SpO2 98%   BMI 34.61 kg/m   Visual Acuity Right Eye Distance:   Left Eye Distance:   Bilateral Distance:    Right Eye Near:   Left Eye Near:    Bilateral Near:     Physical Exam Vitals signs and nursing note reviewed.  Constitutional:      General: She is not in acute distress.    Appearance: She is not ill-appearing, toxic-appearing or diaphoretic.  Musculoskeletal:     Right foot: Normal range of motion and normal capillary refill. Tenderness and bony tenderness (over the 5th metatarsal) present. No swelling, crepitus, deformity or laceration.  Neurological:     Mental Status: She is alert.      UC Treatments / Results  Labs (all labs ordered are listed, but only abnormal results are displayed) Labs Reviewed - No data to display  EKG None  Radiology Dg Foot Complete Right  Result Date: 05/16/2018 CLINICAL DATA:  73 year old female with acute onset pain in the right foot upon standing today. EXAM: RIGHT FOOT COMPLETE - 3+ VIEW COMPARISON:  None. FINDINGS: Linear nondisplaced fracture through the lateral proximal metadiaphysis of the right 5th metatarsal (arrows) with regional cortical thickening. This is an extra-articular fracture. No other No acute osseous abnormality identified. Healed deformity of the 5th proximal phalanx suspected. Joint spaces and alignment within normal limits. Mild to moderate degenerative spurring of the calcaneus. IMPRESSION: Acute nondisplaced extra-articular fracture of the proximal right 5th metatarsal, appearance suggestive of stress fracture etiology. Electronically Signed   By: Genevie Ann M.D.   On: 05/16/2018 20:20    Procedures Procedures (including critical care time)  Medications Ordered in UC Medications - No data to display  Initial Impression / Assessment and Plan / UC  Course  I have reviewed the triage vital signs and the nursing notes.  Pertinent labs & imaging results that were available during my care of the patient were reviewed by me and considered in my medical decision making (see chart for details).      Final Clinical Impressions(s) / UC Diagnoses   Final diagnoses:  Closed nondisplaced fracture of fifth metatarsal bone of right foot, initial encounter  ED Prescriptions    None     1. x-ray results and diagnosis reviewed with patient 2. Offered rx for pain medication, however patient declined; states will take otc analgesics prn 3. Immobilize with cast boot 4.  Recommend supportive treatment with rest, ice, elevation, otc analgesics prn 5. Follow-up with podiatrist ASAP  Controlled Substance Prescriptions [place] Controlled Substance Registry consulted? Not Applicable   Norval Gable, MD 05/16/18 2037

## 2019-09-27 ENCOUNTER — Other Ambulatory Visit: Payer: Self-pay | Admitting: Family Medicine

## 2019-09-27 DIAGNOSIS — M810 Age-related osteoporosis without current pathological fracture: Secondary | ICD-10-CM

## 2019-09-27 DIAGNOSIS — Z1231 Encounter for screening mammogram for malignant neoplasm of breast: Secondary | ICD-10-CM

## 2019-10-23 ENCOUNTER — Other Ambulatory Visit: Payer: Medicare HMO

## 2019-10-23 ENCOUNTER — Ambulatory Visit: Payer: Medicare HMO | Admitting: Hematology and Oncology

## 2019-10-25 ENCOUNTER — Other Ambulatory Visit: Payer: Self-pay

## 2019-10-25 NOTE — Progress Notes (Signed)
[facility]  [address], [address] [place], [place] [address] Phone: [phone]  Fax: [phone]   Clinic Day:  10/26/2019  Referring physician: Denton Lank, MD  Chief Complaint: Laurie [name] is a 74 y.o. 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 Denton Lank for assessment and management.   HPI: The patient states that she has been feeling tired for 1-2 months. The patient did not know about her monoclonal gammopathy until recently. She has also had a new headache that began 2-3 weeks ago.  She denies fevers, sweats or weight loss.  She denies any infections.  CBC followed: 02/05/2018: Hematocrit 34.0, hemoglobin 11.5, MCV 101.4, platelets 323,000, WBC 5,600 09/26/2019: Hematocrit 28.4, hemoglobin 9.5,   MCV 107, platelets 269,000, WBC 3,600 (ANC 2300). 10/03/2019: Hematocrit 27.9, hemoglobin 9.4,   MCV 108, platelets 287,000, WBC 3,800 (ANC 2400).   CMP followed: 09/26/2019: Creatinine 1.09.  Albumin 3.6. Protein 11.4. 10/03/2019: Creatinine 0.94.  Albumin 3.4. Protein 11.2.  SPEP 5.0 gm/dL in the gamma region.  Additional labs on 10/03/2019 revealed a ferritin 82 with an iron saturation of 30% and a TIBC of 206.  Vitamin B12 was 200 (low).  Folate >20.0. Hepatitis C antibody and HIV testing were negative.  Random urine revealed 196.6 mg/dL total protein with 58.3% M-spike.    Symptomatically, she reports shortness of breath and chest pain from her asthma. She reports occasional palpitations. She takes medication for acid reflux. She has constipation, which has improved since she started taking stool softeners. She sometimes has balance problems. The patient fell 2 weeks ago. She denies runny nose, sore throat, cough, or urinary symptoms.  She has not used any new medications or herbal products recently. She reports that she "doesn't eat like she should" and that she snacks. She has received vitamin B12 shots weekly x3  and now goes monthly. The patient had a fall and a traumatic brain injury about 3 years ago.  She received both doses of the COVID-19 vaccine in 08/2019.  Her half brother had prostate cancer. Her maternal cousin had breast cancer. Her maternal aunt died from throat cancer. Her father had skin cancer on his lip.    Past Medical History:  Diagnosis Date  . Anxiety   . Asthma   . Depression   . Head injury   . Osteoporosis     Past Surgical History:  Procedure Laterality Date  . ABDOMINAL HYSTERECTOMY    . HEMORROIDECTOMY    . SEPTOPLASTY      Family History  Problem Relation Age of Onset  . Breast cancer Cousin        mat cousin    Social History:  reports that she has never smoked. She has never used smokeless tobacco. She reports that she does not drink alcohol and does not use drugs.  She denies any exposure to radiation or toxins. She denies alcohol and tobacco use. Her husband has schizophrenia and committed suicide. The patient is retired and has not worked since 56. 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  She then worked in the [employer] x 5 years until disability at age 71.  She lives in [place], [place].  She is a Restaurant manager, fast food.  She does not accept blood products.  The patient is accompanied by her sister in law, Judeth Porch, on the [place] today.  Allergies:  Allergies  Allergen Reactions  .  Levofloxacin     Critical   . Other Other (See Comments)    Antihistamines    Current Medications: Current Outpatient Medications  Medication Sig Dispense Refill  . albuterol (VENTOLIN HFA) 108 (90 Base) MCG/ACT inhaler 2 inhalations every 4 (four) hours as needed.    Marland Kitchen alendronate (FOSAMAX) 70 MG tablet     . atorvastatin (LIPITOR) 20 MG tablet Take 20 mg by mouth daily.    . busPIRone (BUSPAR) 10 MG tablet Take 10 mg by mouth 3 (three) times daily.    Marland Kitchen docusate sodium (COLACE) 100 MG capsule Take 100 mg by mouth  daily.    . fluticasone-salmeterol (ADVAIR HFA) 230-21 MCG/ACT inhaler Inhale into the lungs.    . montelukast (SINGULAIR) 10 MG tablet Take 10 mg by mouth at bedtime.    . naproxen (NAPROSYN) 500 MG tablet Take 500 mg by mouth 2 (two) times daily with a meal.    . nortriptyline (PAMELOR) 25 MG capsule Take by mouth.    Marland Kitchen omeprazole (PRILOSEC) 40 MG capsule Take 40 mg by mouth daily.    . ranitidine (ZANTAC) 150 MG tablet Take 150 mg by mouth 2 (two) times daily.    . sertraline (ZOLOFT) 100 MG tablet Take 200 mg by mouth daily.     . traZODone (DESYREL) 100 MG tablet Take 25 mg by mouth at bedtime.     . Vitamin D, Ergocalciferol, (DRISDOL) 50000 UNITS CAPS capsule Take 50,000 Units by mouth every 7 (seven) days.    Marland Kitchen albuterol (PROVENTIL HFA;VENTOLIN HFA) 108 (90 BASE) MCG/ACT inhaler Inhale into the lungs every 6 (six) hours as needed for wheezing or shortness of breath. (Patient not taking: Reported on 10/25/2019)    . amitriptyline (ELAVIL) 10 MG tablet Take 10 mg by mouth at bedtime.    Marland Kitchen amoxicillin-clavulanate (AUGMENTIN) 875-125 MG tablet Take 1 tablet by mouth 2 (two) times daily. 20 tablet 0  . ibuprofen (ADVIL,MOTRIN) 600 MG tablet Take 600 mg by mouth every 6 (six) hours as needed.    . mometasone-formoterol (DULERA) 100-5 MCG/ACT AERO Inhale 2 puffs into the lungs 2 (two) times daily.    Marland Kitchen trimethoprim-polymyxin b (POLYTRIM) ophthalmic solution Place 1 drop into the left eye every 6 (six) hours. 10 mL 0   No current facility-administered medications for this visit.    Review of Systems  Constitutional: Positive for malaise/fatigue. Negative for chills, diaphoresis, fever and weight loss.  HENT: Negative for congestion, ear discharge, ear pain, hearing loss, nosebleeds, sinus pain, sore throat and tinnitus.   Eyes: Negative.  Negative for blurred vision.  Respiratory: Positive for shortness of breath (from asthma). Negative for cough, hemoptysis and sputum production.         Asthma.  Cardiovascular: Positive for chest pain (from asthma) and palpitations (occasional). Negative for leg swelling.  Gastrointestinal: Positive for constipation (on stool softener) and heartburn (on meds). Negative for abdominal pain, blood in stool, diarrhea, melena, nausea and vomiting.  Genitourinary: Negative.  Negative for dysuria, frequency, hematuria and urgency.  Musculoskeletal: Positive for falls (2 weeks ago). Negative for back pain, joint pain, myalgias and neck pain.  Skin: Negative.  Negative for itching and rash.  Neurological: Positive for headaches (x 2-3 wks). Negative for dizziness, tingling, sensory change and weakness.       Occasional balance problems. Traumatic brain injury 3 years ago.  Endo/Heme/Allergies: Negative.  Does not bruise/bleed easily.  Psychiatric/Behavioral: Positive for depression (after her husband's death, improved). Negative for memory loss.  The patient is not nervous/anxious and does not have insomnia.   All other systems reviewed and are negative.  Performance status (ECOG): 1  Vitals Blood pressure (!) 158/87, pulse (!) 116, temperature (!) 97.4 F (36.3 C), temperature source Tympanic, weight 209 lb 5.2 oz (95 kg), SpO2 98 %.   Physical Exam Vitals and nursing note reviewed.  Constitutional:      General: She is not in acute distress.    Appearance: She is not diaphoretic.  HENT:     Head: Normocephalic and atraumatic.     Comments: Shoulder length gray hair.  Mask.    Ears:     Comments: Glasses.  Blue eyes. Eyes:     General: No scleral icterus.    Extraocular Movements: Extraocular movements intact.     Conjunctiva/sclera: Conjunctivae normal.     Pupils: Pupils are equal, round, and reactive to light.  Cardiovascular:     Rate and Rhythm: Normal rate and regular rhythm.     Pulses: Normal pulses.     Heart sounds: Normal heart sounds. No murmur heard.   Pulmonary:     Effort: Pulmonary effort is normal. No respiratory  distress.     Breath sounds: Normal breath sounds. No wheezing or rales.  Chest:     Chest wall: No tenderness.  Abdominal:     General: Bowel sounds are normal. There is no distension.     Palpations: Abdomen is soft. There is no mass.     Tenderness: There is no abdominal tenderness. There is no guarding or rebound.  Musculoskeletal:        General: No swelling or tenderness. Normal range of motion.     Cervical back: Normal range of motion and neck supple.  Lymphadenopathy:     Head:     Right side of head: No preauricular, posterior auricular or occipital adenopathy.     Left side of head: No preauricular, posterior auricular or occipital adenopathy.     Cervical: No cervical adenopathy.     Upper Body:     Right upper body: No supraclavicular or axillary adenopathy.     Left upper body: No supraclavicular or axillary adenopathy.     Lower Body: No right inguinal adenopathy. No left inguinal adenopathy.  Skin:    General: Skin is warm and dry.  Neurological:     Mental Status: She is alert and oriented to person, place, and time. Mental status is at baseline.  Psychiatric:        Mood and Affect: Mood normal.        Behavior: Behavior normal.        Thought Content: Thought content normal.        Judgment: Judgment normal.    No visits with results within 3 Day(s) from this visit.  Latest known visit with results is:  Admission on 02/06/2018, Discharged on 02/06/2018  Component Date Value Ref Range Status  . Troponin I 02/05/2018 <0.03  <0.03 ng/mL Final   Performed at [facility], [address] [place], [place] [address]  . Lactic Acid, Venous 02/05/2018 1.2  0.5 - 1.9 mmol/L Final   Performed at [facility], [address] [place], [place] [address]  . Sodium 02/05/2018 140  135 - 145 mmol/L Final  . Potassium 02/05/2018 3.9  3.5 - 5.1 mmol/L Final  . Chloride 02/05/2018 104  98 - 111 mmol/L Final  . CO2 02/05/2018 24  22 - 32 mmol/L Final  .  Glucose, Bld 02/05/2018 132* 70 - 99 mg/dL Final  . BUN 88/99/6498 25* 8 - 23 mg/dL Final  . Creatinine, Ser 02/05/2018 0.95  0.44 - 1.00 mg/dL Final  . Calcium 51/90/3474 9.1  8.9 - 10.3 mg/dL Final  . Total Protein 02/05/2018 8.8* 6.5 - 8.1 g/dL Final  . Albumin 51/21/9217 3.8  3.5 - 5.0 g/dL Final  . AST 42/10/6149 23  15 - 41 U/L Final  . ALT 02/05/2018 18  0 - 44 U/L Final  . Alkaline Phosphatase 02/05/2018 70  38 - 126 U/L Final  . Total Bilirubin 02/05/2018 0.6  0.3 - 1.2 mg/dL Final  . GFR calc non Af Amer 02/05/2018 59* >60 mL/min Final  . GFR calc Af Amer 02/05/2018 >60  >60 mL/min Final   Comment: (NOTE) The eGFR has been calculated using the CKD EPI equation. This calculation has not been validated in all clinical situations. eGFR's persistently <60 mL/min signify possible Chronic Kidney Disease.   Eustaquio Boyden gap 02/05/2018 12  5 - 15 Final   Performed at [facility], [address] [place], [place] [address]  . WBC 02/05/2018 5.6  3.6 - 11.0 K/uL Final  . RBC 02/05/2018 3.35* 3.80 - 5.20 MIL/uL Final  . Hemoglobin 02/05/2018 11.5* 12.0 - 16.0 g/dL Final  . HCT 03/07/7573 34.0* 35 - 47 % Final  . MCV 02/05/2018 101.4* 80.0 - 100.0 fL Final  . MCH 02/05/2018 34.4* 26.0 - 34.0 pg Final  . MCHC 02/05/2018 33.9  32.0 - 36.0 g/dL Final  . RDW 71/39/3204 13.4  11.5 - 14.5 % Final  . Platelets 02/05/2018 323  150 - 440 K/uL Final  . Neutrophils Relative % 02/05/2018 66  % Final  . Neutro Abs 02/05/2018 3.7  1.4 - 6.5 K/uL Final  . Lymphocytes Relative 02/05/2018 23  % Final  . Lymphs Abs 02/05/2018 1.3  1.0 - 3.6 K/uL Final  . Monocytes Relative 02/05/2018 7  % Final  . Monocytes Absolute 02/05/2018 0.4  0 - 0 K/uL Final  . Eosinophils Relative 02/05/2018 4  % Final  . Eosinophils Absolute 02/05/2018 0.2  0 - 0 K/uL Final  . Basophils Relative 02/05/2018 0  % Final  . Basophils Absolute 02/05/2018 0.0  0 - 0 K/uL Final   Performed at [facility], [address] [place], [place] [address]  . Specimen Description 02/05/2018 BLOOD RIGHT ASSIST CONTROL   Final  . Special Requests 02/05/2018 BOTTLES DRAWN AEROBIC AND ANAEROBIC Blood Culture adequate volume   Final  . Culture 02/05/2018    Final                   Value:NO GROWTH 5 DAYS Performed at [facility], [address] [place], [place] [address]   . Report Status 02/05/2018 02/10/2018 FINAL   Final    Assessment:  JAI BEAR is a 74 y.o. 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  SPEP was 5.0 gm/dL on 69/71/9538. Random urine revealed 196.6 mg/dL total protein with 67.3% M-spike.    Labs on 10/03/2019 revealed a hematocrit 27.9, hemoglobin 9.4, MCV 108, platelets 287,000, WBC 3,800 (ANC 2400).   Creatinine was 0.94, albumin 3.4, and protein 11.2.  Ferritin was 82 with an iron saturation of 30% and a TIBC of 206.  Hepatitis C antibody and HIV testing were negative.    She has B12 deficiency. Vitamin B12 was 200 (low) and folate >20.0 on 10/03/2019.  She was on weekly  B12 injections x 3 and is now on monthly B12 injections.  She is a Restaurant manager, fast food.  She received both doses of the COVID-19 vaccine in 08/2019.  Symptomatically, she has been fatigued x 1-2 months.  She denies any issues with bone pain or infections.  Exam is unremarkable.  Plan: 1.   Labs today:  CBC with diff, CMP, myeloma panel, FLCA, beta2-microglobulin, LDH, TSH, retic. 2.   24 hour urine for UPEP and free light chains. 3.   Monoclonal gammopathy  Discuss monoclonal gammopathies (MGUS vs lymphoplasmacytic disorders/myeloma).  Review CRAB criteria:  HyperCalcemia, Renal dysfunction, Anemia, Bone lesions.  Discuss concern for multiple myeloma.     Briefly discuss treatment based on her thoughts about transfusion.  Discuss workup (blood, urine, bone survey).  Discuss bone marrow aspirate and biopsy.  Patient agreeable to initial evaluation and then consideration of a bone marrow biopsy. 4.   B12  deficiency  Etiology is unclear.    Diet is modest.  She is now on monthly B12 injections.  Discuss future work-up for pernicious anemia. 5.   Macrocytic anemia  Etiology likely secondary to B12 deficiency in conjunction with multiple myeloma.  Check TSH and retic. 6.   Bone survey today. 7.   RTC in 7-10 days for MD assessment, review of work-up and discussion regarding direction of therapy.  I discussed the assessment and treatment plan with the patient.  The patient was provided an opportunity to ask questions and all were answered.  The patient agreed with the plan and demonstrated an understanding of the instructions.  The patient was advised to call back if the symptoms worsen or if the condition fails to improve as anticipated.  I provided 27 minutes of face-to-face time during this this encounter and > 50% was spent counseling as documented under my assessment and plan. An additional 10 minutes were spent reviewing her chart (Epic and Care Everywhere) including notes, labs, and imaging studies.    Alacia Rehmann C. Mike Gip, MD, PhD    10/26/2019, 3:13 PM  I, De Burrs, am acting as scribe for [employer]. Mike Gip, MD, PhD.  I, Rasaan Brotherton C. Mike Gip, MD, have reviewed the above documentation for accuracy and completeness, and I agree with the above.

## 2019-10-26 ENCOUNTER — Ambulatory Visit: Payer: Medicare HMO

## 2019-10-26 ENCOUNTER — Ambulatory Visit
Admission: RE | Admit: 2019-10-26 | Discharge: 2019-10-26 | Disposition: A | Discharge status: Home / Self Care | Payer: Medicare HMO | Source: Ambulatory Visit | Attending: Hematology and Oncology | Admitting: Hematology and Oncology

## 2019-10-26 ENCOUNTER — Ambulatory Visit
Admission: RE | Admit: 2019-10-26 | Discharge: 2019-10-26 | Disposition: A | Discharge status: Home / Self Care | Payer: Medicare HMO | Attending: Hematology and Oncology | Admitting: Hematology and Oncology

## 2019-10-26 ENCOUNTER — Inpatient Hospital Stay: Payer: Medicare HMO

## 2019-10-26 ENCOUNTER — Inpatient Hospital Stay: Payer: Medicare HMO | Attending: Hematology and Oncology | Admitting: Hematology and Oncology

## 2019-10-26 ENCOUNTER — Encounter: Payer: Self-pay | Admitting: Hematology and Oncology

## 2019-10-26 VITALS — BP 158/87 | HR 116 | Temp 97.4°F | Wt 209.3 lb

## 2019-10-26 DIAGNOSIS — Z79899 Other long term (current) drug therapy: Secondary | ICD-10-CM | POA: Insufficient documentation

## 2019-10-26 DIAGNOSIS — D472 Monoclonal gammopathy: Secondary | ICD-10-CM | POA: Insufficient documentation

## 2019-10-26 DIAGNOSIS — F329 Major depressive disorder, single episode, unspecified: Secondary | ICD-10-CM | POA: Insufficient documentation

## 2019-10-26 DIAGNOSIS — Z7951 Long term (current) use of inhaled steroids: Secondary | ICD-10-CM | POA: Diagnosis not present

## 2019-10-26 DIAGNOSIS — D539 Nutritional anemia, unspecified: Secondary | ICD-10-CM

## 2019-10-26 DIAGNOSIS — K5900 Constipation, unspecified: Secondary | ICD-10-CM | POA: Insufficient documentation

## 2019-10-26 DIAGNOSIS — K219 Gastro-esophageal reflux disease without esophagitis: Secondary | ICD-10-CM | POA: Insufficient documentation

## 2019-10-26 DIAGNOSIS — F419 Anxiety disorder, unspecified: Secondary | ICD-10-CM | POA: Insufficient documentation

## 2019-10-26 DIAGNOSIS — E538 Deficiency of other specified B group vitamins: Secondary | ICD-10-CM | POA: Insufficient documentation

## 2019-10-26 DIAGNOSIS — J45909 Unspecified asthma, uncomplicated: Secondary | ICD-10-CM | POA: Diagnosis not present

## 2019-10-26 DIAGNOSIS — Z90710 Acquired absence of both cervix and uterus: Secondary | ICD-10-CM | POA: Diagnosis not present

## 2019-10-26 DIAGNOSIS — Z791 Long term (current) use of non-steroidal anti-inflammatories (NSAID): Secondary | ICD-10-CM | POA: Diagnosis not present

## 2019-10-26 DIAGNOSIS — D649 Anemia, unspecified: Secondary | ICD-10-CM | POA: Diagnosis not present

## 2019-10-26 DIAGNOSIS — Z803 Family history of malignant neoplasm of breast: Secondary | ICD-10-CM | POA: Diagnosis not present

## 2019-10-26 LAB — COMPREHENSIVE METABOLIC PANEL
ALT: 15 U/L (ref 0–44)
AST: 20 U/L (ref 15–41)
Albumin: 3.2 g/dL — ABNORMAL LOW (ref 3.5–5.0)
Alkaline Phosphatase: 49 U/L (ref 38–126)
Anion gap: 9 (ref 5–15)
BUN: 22 mg/dL (ref 8–23)
CO2: 22 mmol/L (ref 22–32)
Calcium: 8.6 mg/dL — ABNORMAL LOW (ref 8.9–10.3)
Chloride: 102 mmol/L (ref 98–111)
Creatinine, Ser: 1.06 mg/dL — ABNORMAL HIGH (ref 0.44–1.00)
GFR calc Af Amer: >60 mL/min (ref >60)
GFR calc non Af Amer: 52 mL/min — ABNORMAL LOW (ref >60)
Glucose, Bld: 106 mg/dL — ABNORMAL HIGH (ref 70–99)
Potassium: 4.1 mmol/L (ref 3.5–5.1)
Sodium: 133 mmol/L — ABNORMAL LOW (ref 135–145)
Total Bilirubin: 0.6 mg/dL (ref 0.3–1.2)
Total Protein: 11.4 g/dL — ABNORMAL HIGH (ref 6.5–8.1)

## 2019-10-26 LAB — CBC WITH DIFFERENTIAL/PLATELET
Abs Immature Granulocytes: 0.01 10*3/uL (ref 0.00–0.07)
Basophils Absolute: 0 10*3/uL (ref 0.0–0.1)
Basophils Relative: 1 %
Eosinophils Absolute: 0.1 10*3/uL (ref 0.0–0.5)
Eosinophils Relative: 3 %
HCT: 27.4 % — ABNORMAL LOW (ref 36.0–46.0)
Hemoglobin: 9.1 g/dL — ABNORMAL LOW (ref 12.0–15.0)
Immature Granulocytes: 0 %
Lymphocytes Relative: 28 %
Lymphs Abs: 1 10*3/uL (ref 0.7–4.0)
MCH: 36.4 pg — ABNORMAL HIGH (ref 26.0–34.0)
MCHC: 33.2 g/dL (ref 30.0–36.0)
MCV: 109.6 fL — ABNORMAL HIGH (ref 80.0–100.0)
Monocytes Absolute: 0.3 10*3/uL (ref 0.1–1.0)
Monocytes Relative: 7 %
Neutro Abs: 2.1 10*3/uL (ref 1.7–7.7)
Neutrophils Relative %: 61 %
Platelets: 250 10*3/uL (ref 150–400)
RBC: 2.5 MIL/uL — ABNORMAL LOW (ref 3.87–5.11)
RDW: 14 % (ref 11.5–15.5)
WBC: 3.4 10*3/uL — ABNORMAL LOW (ref 4.0–10.5)
nRBC: 0 % (ref 0.0–0.2)

## 2019-10-26 LAB — RETICULOCYTES
Immature Retic Fract: 22.5 % — ABNORMAL HIGH (ref 2.3–15.9)
RBC.: 2.46 MIL/uL — ABNORMAL LOW (ref 3.87–5.11)
Retic Count, Absolute: 53.6 10*3/uL (ref 19.0–186.0)
Retic Ct Pct: 2.2 % (ref 0.4–3.1)

## 2019-10-26 LAB — LACTATE DEHYDROGENASE: LDH: 102 U/L (ref 98–192)

## 2019-10-26 LAB — TSH: TSH: 3.673 u[IU]/mL (ref 0.350–4.500)

## 2019-10-27 LAB — KAPPA/LAMBDA LIGHT CHAINS
Kappa free light chain: 1376.3 mg/L — ABNORMAL HIGH (ref 3.3–19.4)
Lambda free light chains: <1.5 mg/L — ABNORMAL LOW (ref 5.7–26.3)

## 2019-10-27 LAB — BETA 2 MICROGLOBULIN, SERUM: Beta-2 Microglobulin: 10.3 mg/L — ABNORMAL HIGH (ref 0.6–2.4)

## 2019-10-30 ENCOUNTER — Other Ambulatory Visit: Payer: Self-pay

## 2019-10-30 DIAGNOSIS — D472 Monoclonal gammopathy: Secondary | ICD-10-CM | POA: Diagnosis not present

## 2019-10-30 LAB — MULTIPLE MYELOMA PANEL, SERUM
Albumin SerPl Elph-Mcnc: 3.7 g/dL (ref 2.9–4.4)
Albumin/Glob SerPl: 0.5 — ABNORMAL LOW (ref 0.7–1.7)
Alpha 1: 0.2 g/dL (ref 0.0–0.4)
Alpha2 Glob SerPl Elph-Mcnc: 0.6 g/dL (ref 0.4–1.0)
B-Globulin SerPl Elph-Mcnc: 0.9 g/dL (ref 0.7–1.3)
Gamma Glob SerPl Elph-Mcnc: 5.9 g/dL — ABNORMAL HIGH (ref 0.4–1.8)
Globulin, Total: 7.7 g/dL — ABNORMAL HIGH (ref 2.2–3.9)
IgA: 6093 mg/dL — ABNORMAL HIGH (ref 64–422)
IgG (Immunoglobin G), Serum: 108 mg/dL — ABNORMAL LOW (ref 586–1602)
IgM (Immunoglobulin M), Srm: 7 mg/dL — ABNORMAL LOW (ref 26–217)
M Protein SerPl Elph-Mcnc: 5.7 g/dL — ABNORMAL HIGH
Total Protein ELP: 11.4 g/dL — ABNORMAL HIGH (ref 6.0–8.5)

## 2019-11-01 LAB — FREE K+L LT CHAINS,QN,UR
Free Kappa Lt Chains,Ur: 2620.44 mg/L — ABNORMAL HIGH (ref 0.63–113.79)
Free Kappa/Lambda Ratio: 436.01 — ABNORMAL HIGH (ref 1.03–31.76)
Free Lambda Lt Chains,Ur: 6.01 mg/L (ref 0.47–11.77)
Total Volume: 1100

## 2019-11-02 LAB — UPEP/TP, 24-HR URINE
Albumin, U: 11.8 %
Alpha 1, Urine: 0.7 %
Alpha 2, Urine: 4.8 %
Beta, Urine: 8.1 %
Gamma Globulin, Urine: 74.7 %
M-Spike, mg/24 hr: 665.9 mg/24 hr — ABNORMAL HIGH
M-spike, %: 65.3 % — ABNORMAL HIGH
Total Protein, Urine-Ur/day: 1020 mg/24 hr — ABNORMAL HIGH (ref 30–150)
Total Protein, Urine: 92.7 mg/dL
Total Volume: 1100

## 2019-11-02 NOTE — Progress Notes (Signed)
[facility]  [address], [address] [place], [place] [address] Phone: [phone]  Fax: [phone]   Clinic Day:  11/03/2019  Referring physician: Denton Lank, MD  Chief Complaint: Laurie [name] is a 74 y.o. 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  HPI: The patient was last seen in the hematology clinic on 10/26/2019 for a new patient assessment. She was noted to have a 5.0 gm/dl monoclonal gammopathy. She felt fatigued x 1-2 months.  She denied any issues with bone pain or infections.  Exam was unremarkable. She was felt most likely to have multiple myeloma.  Work-up revealed a hematocrit was 27.4, hemoglobin 9.1, MCV 109.6, platelets 250,000, WBC 3,400 (ANC 2,100).  Retic was 2.2%.  Creatinine was 1.06. Calcium was 8.6. Total protein was 11.4 and albumin 3.2.  M-spike was 5.7 gm/dL biclonal IgA protein with kappa specificity.  Kappa free light chain were 1,376.3, lambda free light chains <1.5, and ratio > 917.53 (0.26-1.65).  Beta 2 microglobulin was 10.3 (0.6 - 2.4). LDH was 102. TSH was 3.673.   24 hour UPEP on 10/30/2019 revealed 1020 mg/24 hours protein with 65.3% M-spike (665.9 mg/24 hours).  Urine free kappa light chain were 2,620.44, free lambda light chain 6.01 and ratio 436.01 (1.03-31.76).  Bone survey on 10/26/2019 revealed small lytic lesions in the skull. There was no acute fracture.  During the interim, she reports no new symptoms. The patient is agreeable to a bone marrow biopsy on 11/13/2019. She would like to be treated as soon as possible but does not want any blood products.   Past Medical History:  Diagnosis Date  . Anxiety   . Asthma   . Depression   . Head injury   . Osteoporosis     Past Surgical History:  Procedure Laterality Date  . ABDOMINAL HYSTERECTOMY    . HEMORROIDECTOMY    . SEPTOPLASTY      Family History  Problem Relation Age of Onset    . Breast cancer Cousin        mat cousin    Social History:  reports that she has never smoked. She has never used smokeless tobacco. She reports that she does not drink alcohol and does not use drugs.  She denies any exposure to radiation or toxins. She denies alcohol and tobacco use. Her husband has schizophrenia and committed suicide. The patient is retired and has not worked since 7. 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  She then worked in the [employer] x 5 years until disability at age 43.  She lives in [place], [place].  She is a Restaurant manager, fast food.  She does not accept blood products.  The patient is accompanied by her sister in law, Judeth Porch, on the [place] today.  Allergies:  Allergies  Allergen Reactions  . Levofloxacin     Critical   . Other Other (See Comments)    Antihistamines    Current Medications: Current Outpatient Medications  Medication Sig Dispense Refill  . albuterol (VENTOLIN HFA) 108 (90 Base) MCG/ACT inhaler 2 inhalations every 4 (four) hours as needed.    Marland Kitchen alendronate (FOSAMAX) 70 MG tablet     . atorvastatin (LIPITOR) 20 MG tablet Take 20 mg by mouth daily.    Marland Kitchen docusate sodium (COLACE) 100 MG capsule Take 100 mg by mouth daily.    . fluticasone-salmeterol (  ADVAIR HFA) 240-97 MCG/ACT inhaler Inhale into the lungs.    . montelukast (SINGULAIR) 10 MG tablet Take 10 mg by mouth at bedtime.    . naproxen (NAPROSYN) 500 MG tablet Take 500 mg by mouth 2 (two) times daily with a meal.    . nortriptyline (PAMELOR) 25 MG capsule Take by mouth.    . sertraline (ZOLOFT) 100 MG tablet Take 200 mg by mouth daily.     . traZODone (DESYREL) 100 MG tablet Take 25 mg by mouth at bedtime.     . Vitamin D, Ergocalciferol, (DRISDOL) 50000 UNITS CAPS capsule Take 50,000 Units by mouth every 7 (seven) days.    Marland Kitchen atorvastatin (LIPITOR) 20 MG tablet Take by mouth.     No current facility-administered medications for this  visit.    Review of Systems  Constitutional: Positive for malaise/fatigue and weight loss (1 lb). Negative for chills, diaphoresis and fever.  HENT: Negative for congestion, ear discharge, ear pain, hearing loss, nosebleeds, sinus pain, sore throat and tinnitus.   Eyes: Negative.  Negative for blurred vision.  Respiratory: Positive for shortness of breath (from asthma). Negative for cough, hemoptysis and sputum production.        Asthma.  Cardiovascular: Positive for chest pain (from asthma) and palpitations (occasional). Negative for leg swelling.  Gastrointestinal: Positive for constipation (on stool softener) and heartburn (on meds). Negative for abdominal pain, blood in stool, diarrhea, melena, nausea and vomiting.  Genitourinary: Negative.  Negative for dysuria, frequency, hematuria and urgency.  Musculoskeletal: Positive for falls (3 weeks ago). Negative for back pain, joint pain, myalgias and neck pain.  Skin: Negative.  Negative for itching and rash.  Neurological: Positive for headaches (x 2-3 wks). Negative for dizziness, tingling, sensory change and weakness.       Occasional balance problems. Traumatic brain injury 3 years ago.  Endo/Heme/Allergies: Negative.  Does not bruise/bleed easily.  Psychiatric/Behavioral: Positive for depression (after her husband's death, improved). Negative for memory loss. The patient is not nervous/anxious and does not have insomnia.   All other systems reviewed and are negative.  Performance status (ECOG): 1  Vitals Blood pressure (!) 145/85, pulse 91, temperature 97.7 F (36.5 C), temperature source Tympanic, resp. rate 18, height _0  (1.651 m), weight 208 lb 8.9 oz (94.6 kg), SpO2 100 %.   Physical Exam Vitals and nursing note reviewed.  Constitutional:      General: She is not in acute distress.    Appearance: She is not diaphoretic.  HENT:     Head: Normocephalic and atraumatic.     Comments: Shoulder length gray hair.  Mask.    Ears:      Comments: Glasses.  Blue eyes. Eyes:     General: No scleral icterus.    Extraocular Movements: Extraocular movements intact.     Conjunctiva/sclera: Conjunctivae normal.  Neurological:     Mental Status: She is alert and oriented to person, place, and time. Mental status is at baseline.  Psychiatric:        Mood and Affect: Mood normal.        Behavior: Behavior normal.        Thought Content: Thought content normal.        Judgment: Judgment normal.    No visits with results within 3 Day(s) from this visit.  Latest known visit with results is:  Orders Only on 10/30/2019  Component Date Value Ref Range Status  . Free Kappa Lt Chains,Ur 10/30/2019 2,620.44* 0.63 - 113.79 mg/L  Final  . Free Lambda Lt Chains,Ur 10/30/2019 6.01  0.47 - 11.77 mg/L Final  . Free Kappa/Lambda Ratio 10/30/2019 436.01* 1.03 - 31.76 Final   Comment: (NOTE) A duplicate report has been generated due to demographic updates. Performed At: [facility] [place], [place] [id number] Rush Farmer MD [id number]   . Total Volume 10/30/2019 1,100   Final   Performed at [facility], [address] [place], [place] [address]  . Total Protein, Urine 10/30/2019 92.7  Not Estab. mg/dL Final  . Total Protein, Urine-Ur/day 10/30/2019 1,020* 30 - 150 mg/24 hr Final  . Albumin, U 10/30/2019 11.8  % Final  . Alpha 1, Urine 10/30/2019 0.7  % Final  . Alpha 2, Urine 10/30/2019 4.8  % Final  . Beta, Urine 10/30/2019 8.1  % Final  . Gamma Globulin, Urine 10/30/2019 74.7  % Final  . M-spike, % 10/30/2019 65.3* Not Observed % Final  . M-Spike, mg/24 hr 10/30/2019 665.9* Not Observed mg/24 hr Final  . Please Note: 10/30/2019 Comment   Final   Comment: (NOTE) Protein electrophoresis scan will follow via computer, mail, or courier delivery. A duplicate report has been generated due to demographic updates. Performed At: [facility] [place], [place]  [id number] Rush Farmer MD [id number]   . Total Volume 10/30/2019 1,100   Final   Performed at [facility], [address] [place], [place] [address]    Assessment:  Laurie [name] is a 74 y.o. 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  SPEP was 5.0 gm/dL on 10/03/2019. Random urine revealed 196.6 mg/dL total protein with 58.3% M-spike.    Labs on 10/03/2019 revealed a hematocrit 27.9, hemoglobin 9.4, MCV 108, platelets 287,000, WBC 3,800 (ANC 2400).   Creatinine was 0.94, albumin 3.4, and protein 11.2.  Ferritin was 82 with an iron saturation of 30% and a TIBC of 206.  Hepatitis C antibody and HIV testing were negative.    Work-up on 10/26/2019 revealed a hematocrit was 27.4, hemoglobin 9.1, MCV 109.6, platelets 250,000, WBC 3,400 (ANC 2,100).  Retic was 2.2%.  Creatinine was 1.06 and calcium 8.6. Total protein was 11.4 and albumin 3.2.  M-spike was 5.7 gm/dL biclonal IgA protein with kappa specificity.  Kappa free light chain were 1,376.3, lambda free light chains <1.5, and ratio > 917.53 (0.26-1.65).  Beta 2 microglobulin was 10.3 (0.6 - 2.4). LDH was 102. TSH was 3.673.   24 hour UPEP on 10/30/2019 revealed 1020 mg/24 hours protein with 65.3% M-spike (665.9 mg/24 hours).  Urine free kappa light chain were 2,620.44, free lambda light chain 6.01 and ratio 436.01 (1.03-31.76).  Bone survey on 10/26/2019 revealed small lytic lesions in the skull. There was no acute fracture.  She has B12 deficiency. Vitamin B12 was 200 (low) and folate >20.0 on 10/03/2019.  She was on weekly B12 injections x 3 and is now on monthly B12 injections.  She is a Restaurant manager, fast food.  She received both doses of the COVID-19 vaccine in 08/2019.  Symptomatically, she denies any concerns.  Hemoglobin was 9.1 on 10/26/2019.  Plan: 1.   Review work-up from 10/26/2019. 2.   Monoclonal gammopathy  Patient has a 5.7 gm/dL biclonal IgA gammopathy with kappa specificity.  Kappa free light chains were  1,376.3 with a ratio > 917.53 (0.26-1.65).    Beta 2 microglobulin was 10.3 (0.6 - 2.4).   Bone survey reveals lytic lesions in skull.  Review CRAB criteria:  HyperCalcemia, Renal  dysfunction, Anemia, Bone lesions.  She has multiple myeloma.   Discuss bone marrow aspirate and biopsy.   Bone marrow described in detail.   Risks and benefits of procedure described.  Discuss plan for treatment avoiding blood products as she is a Jehovah's Witness.  3.   B12 deficiency  B12 was 200 (low).  Diet is modest.  She is on monthly B12 injections.  Check intrinsic factor and anti-parietal cell antibodies at next lab draw for pernicious anemia. 4.   Macrocytic anemia  Etiology likely secondary to B12 deficiency in conjunction with multiple myeloma.  TSH was 3.673 (normal)  Retic was 2.2% (low) likely reflecting marrow involvement with myeloma. 5.   Bone marrow aspirate and biopsy on 11/13/2019. 6.   RTC 7-10 days after bone marrow for MD assessment, review of marrow and discussion regarding direction of therapy.  I discussed the assessment and treatment plan with the patient.  The patient was provided an opportunity to ask questions and all were answered.  The patient agreed with the plan and demonstrated an understanding of the instructions.  The patient was advised to call back if the symptoms worsen or if the condition fails to improve as anticipated.   Caysen Whang C. Mike Gip, MD, PhD    11/03/2019, 11:34 AM  I, De Burrs, am acting as Education administrator for [employer]. Mike Gip, MD, PhD.  I, Lauri Till C. Mike Gip, MD, have reviewed the above documentation for accuracy and completeness, and I agree with the above.

## 2019-11-03 ENCOUNTER — Inpatient Hospital Stay: Payer: Medicare HMO | Attending: Hematology and Oncology | Admitting: Hematology and Oncology

## 2019-11-03 ENCOUNTER — Other Ambulatory Visit: Payer: Self-pay

## 2019-11-03 ENCOUNTER — Telehealth: Payer: Self-pay

## 2019-11-03 ENCOUNTER — Encounter: Payer: Self-pay | Admitting: Hematology and Oncology

## 2019-11-03 VITALS — BP 145/85 | HR 91 | Temp 97.7°F | Resp 18 | Ht 65.0 in | Wt 208.6 lb

## 2019-11-03 DIAGNOSIS — Z7189 Other specified counseling: Secondary | ICD-10-CM

## 2019-11-03 DIAGNOSIS — F419 Anxiety disorder, unspecified: Secondary | ICD-10-CM | POA: Insufficient documentation

## 2019-11-03 DIAGNOSIS — D472 Monoclonal gammopathy: Secondary | ICD-10-CM

## 2019-11-03 DIAGNOSIS — M899 Disorder of bone, unspecified: Secondary | ICD-10-CM

## 2019-11-03 DIAGNOSIS — Z7952 Long term (current) use of systemic steroids: Secondary | ICD-10-CM | POA: Diagnosis not present

## 2019-11-03 DIAGNOSIS — Z79899 Other long term (current) drug therapy: Secondary | ICD-10-CM | POA: Insufficient documentation

## 2019-11-03 DIAGNOSIS — Z5112 Encounter for antineoplastic immunotherapy: Secondary | ICD-10-CM | POA: Diagnosis not present

## 2019-11-03 DIAGNOSIS — D649 Anemia, unspecified: Secondary | ICD-10-CM | POA: Diagnosis not present

## 2019-11-03 DIAGNOSIS — Z791 Long term (current) use of non-steroidal anti-inflammatories (NSAID): Secondary | ICD-10-CM | POA: Diagnosis not present

## 2019-11-03 DIAGNOSIS — E538 Deficiency of other specified B group vitamins: Secondary | ICD-10-CM | POA: Diagnosis not present

## 2019-11-03 DIAGNOSIS — Z7951 Long term (current) use of inhaled steroids: Secondary | ICD-10-CM | POA: Insufficient documentation

## 2019-11-03 DIAGNOSIS — Z803 Family history of malignant neoplasm of breast: Secondary | ICD-10-CM | POA: Insufficient documentation

## 2019-11-03 DIAGNOSIS — F329 Major depressive disorder, single episode, unspecified: Secondary | ICD-10-CM | POA: Diagnosis not present

## 2019-11-03 DIAGNOSIS — J45909 Unspecified asthma, uncomplicated: Secondary | ICD-10-CM | POA: Insufficient documentation

## 2019-11-03 DIAGNOSIS — D539 Nutritional anemia, unspecified: Secondary | ICD-10-CM | POA: Diagnosis not present

## 2019-11-03 DIAGNOSIS — Z7982 Long term (current) use of aspirin: Secondary | ICD-10-CM | POA: Insufficient documentation

## 2019-11-03 DIAGNOSIS — C9000 Multiple myeloma not having achieved remission: Secondary | ICD-10-CM | POA: Diagnosis present

## 2019-11-03 NOTE — Telephone Encounter (Signed)
I fax over the [place]. FOR BONE MARROW BIOPSY/ASPIRATION.

## 2019-11-03 NOTE — Telephone Encounter (Signed)
Spoke with patient to let her know that her biopsy was scheduled for 11/13/2019 and she should be there at 8am for her procedure to start at 9 am. And that a nurse will be contacting her and instructing her on the [place] and don'ts before the procedure.

## 2019-11-07 NOTE — Progress Notes (Signed)
Patient on schedule for BMB 11/13/2019. Spoke with patient with pre procedure instructions given. Made aware to be here @ 0800, NPO after MN prior to procedure as well as driver post procedure after discharge. Stated understanding.

## 2019-11-09 ENCOUNTER — Other Ambulatory Visit: Payer: Self-pay | Admitting: Radiology

## 2019-11-10 ENCOUNTER — Other Ambulatory Visit: Payer: Self-pay | Admitting: Student

## 2019-11-10 ENCOUNTER — Other Ambulatory Visit: Payer: Self-pay | Admitting: Radiology

## 2019-11-12 NOTE — Consult Note (Signed)
Chief Complaint: Monoclonal gammopathy. Bone marrow biopsy for further determination of causation.  Referring Physician(s): Dr. Katharina Caper  Supervising Physician: Markus Daft  Patient Status: [facility] - Out-pt  History of Present Illness: Laurie [name] is a 74 y.o. female outpatient, Jehovah's Witness. History of asthma, GERD, osteoporosis, macrocytic anemia secondary to B12 deficiency. Patient was found to have monoclonal gammopathy while being worked up for 1 to 2 months of fatigue. Team is requesting bone marrow biopsy for further determination of MGUS vs lymphoplasmacytic disorder vs. multiple myeloma. Patient denies any medical issues at this time.   Past Medical History:  Diagnosis Date  . Anxiety   . Asthma   . Depression   . Head injury   . Osteoporosis     Past Surgical History:  Procedure Laterality Date  . ABDOMINAL HYSTERECTOMY    . HEMORROIDECTOMY    . SEPTOPLASTY      Allergies: Levofloxacin and Other  Medications: Prior to Admission medications   Medication Sig Start Date End Date Taking? Authorizing Provider  albuterol (VENTOLIN HFA) 108 (90 Base) MCG/ACT inhaler 2 inhalations every 4 (four) hours as needed.    [provider]  alendronate (FOSAMAX) 70 MG tablet  11/21/15   [provider]  atorvastatin (LIPITOR) 20 MG tablet Take 20 mg by mouth daily.    [provider]  atorvastatin (LIPITOR) 20 MG tablet Take by mouth.    [provider]  docusate sodium (COLACE) 100 MG capsule Take 100 mg by mouth daily.    [provider]  fluticasone-salmeterol (ADVAIR HFA) 230-21 MCG/ACT inhaler Inhale into the lungs. 01/28/17   [provider]  montelukast (SINGULAIR) 10 MG tablet Take 10 mg by mouth at bedtime.    [provider]  naproxen (NAPROSYN) 500 MG tablet Take 500 mg by mouth 2 (two) times daily with a meal.    [provider]  nortriptyline (PAMELOR) 25 MG capsule Take by mouth.  05/26/16   [provider]  sertraline (ZOLOFT) 100 MG tablet Take 200 mg by mouth daily.     [provider]  traZODone (DESYREL) 100 MG tablet Take 25 mg by mouth at bedtime.     [provider]  Vitamin D, Ergocalciferol, (DRISDOL) 50000 UNITS CAPS capsule Take 50,000 Units by mouth every 7 (seven) days.    [provider]     Family History  Problem Relation Age of Onset  . Breast cancer Cousin        mat cousin    Social History   Socioeconomic History  . Marital status: Widowed    Spouse name: Not on file  . Number of children: Not on file  . Years of education: Not on file  . Highest education level: Not on file  Occupational History  . Not on file  Tobacco Use  . Smoking status: Never Smoker  . Smokeless tobacco: Never Used  Substance and Sexual Activity  . Alcohol use: No  . Drug use: No  . Sexual activity: Not on file  Other Topics Concern  . Not on file  Social History Narrative  . Not on file   Social Determinants of Health   Financial Resource Strain:   . Difficulty of Paying Living Expenses:   Food Insecurity:   . Worried About Charity fundraiser in the Last Year:   . Arboriculturist in the Last Year:   Transportation Needs:   . Film/video editor (Medical):   Marland Kitchen  Lack of Transportation (Non-Medical):   Physical Activity:   . Days of Exercise per Week:   . Minutes of Exercise per Session:   Stress:   . Feeling of Stress :   Social Connections:   . Frequency of Communication with Friends and Family:   . Frequency of Social Gatherings with Friends and Family:   . Attends Religious Services:   . Active Member of Clubs or Organizations:   . Attends Banker Meetings:   Marland Kitchen Marital Status:     Review of Systems: A 12 point ROS discussed and pertinent positives are indicated in the HPI above.  All other systems are negative.  Review of Systems  Constitutional: Negative for fatigue and fever.  HENT:  Negative for congestion.   Respiratory: Negative for cough and shortness of breath.   Gastrointestinal: Negative for abdominal pain, diarrhea, nausea and vomiting.  Musculoskeletal: Positive for myalgias ( left flank.).    Vital Signs: There were no vitals taken for this visit.  Physical Exam Vitals and nursing note reviewed.  Constitutional:      Appearance: She is well-developed.  HENT:     Head: Normocephalic and atraumatic.  Eyes:     Conjunctiva/sclera: Conjunctivae normal.  Cardiovascular:     Rate and Rhythm: Normal rate and regular rhythm.     Heart sounds: Normal heart sounds.  Pulmonary:     Effort: Pulmonary effort is normal.     Breath sounds: Normal breath sounds.  Musculoskeletal:        General: Normal range of motion.     Cervical back: Normal range of motion.  Skin:    General: Skin is warm.  Neurological:     Mental Status: She is alert and oriented to person, place, and time.     Imaging: DG Bone Survey Met  Result Date: 10/28/2019 CLINICAL DATA:  Monoclonal gammopathy.  Rule out lytic lesions. EXAM: METASTATIC BONE SURVEY COMPARISON:  None. FINDINGS: Images of the axial and appendicular skeleton are performed. Skull: Soft and UPPER changes are consistent assistant with small lytic lesions. Spine: Mild degenerative changes in the mid and LOWER cervical spine levels. Mild degenerative changes in the midthoracic spine. No lytic or blastic lesions. Remote compression fracture of T6. Facet hypertrophy noted in the LOWER lumbar levels. No lytic or blastic lesions. No acute fracture. Long bones: No lytic or blastic lesions. Degenerative changes in the RIGHT knee. Pelvis: No lytic or blastic lesions. Chest: Heart size is normal. Lungs are clear. No pulmonary edema. No rib lesions identified. Abdomen: Significant stool burden. Nonobstructive bowel gas pattern. Atherosclerotic calcification of the abdominal aorta. IMPRESSION: 1. Lytic lesions in the skull. 2. No acute  fracture. Electronically Signed   By: Norva Pavlov M.D.   On: 10/28/2019 09:24    Labs:  CBC: Recent Labs    10/26/19 1550  WBC 3.4*  HGB 9.1*  HCT 27.4*  PLT 250    COAGS: No results for input(s): INR, APTT in the last 8760 hours.  BMP: Recent Labs    10/26/19 1550  NA 133*  K 4.1  CL 102  CO2 22  GLUCOSE 106*  BUN 22  CALCIUM 8.6*  CREATININE 1.06*  GFRNONAA 52*  GFRAA >60    LIVER FUNCTION TESTS: Recent Labs    10/26/19 1550  BILITOT 0.6  AST 20  ALT 15  ALKPHOS 49  PROT 11.4*  ALBUMIN 3.2*    Assessment and Plan:  74 y.o, female outpatient, Jehovah's Witness. History  of asthma, GERD, osteoporosis, macrocytic anemia secondary to B12 deficiency. Patient was found to have monoclonal gammopathy while being worked up for 1 to 2 months of fatigue. Team is requesting bone marrow biopsy for further determination of MGUS vs lymphoplasmacytic disorder vs. multiple myeloma. Patient is not on anticoagulation.   WBC is 3.0, MCV and MCH are elevated. All other labs and medications are within acceptable parameters.  Patient is afebrile.  Risks and benefits of bone marrow biopsy was discussed with the patient and/or patient's family including, but not limited to bleeding, infection, damage to adjacent structures or low yield requiring additional tests.  All of the questions were answered and there is agreement to proceed.  Consent signed and in chart.    Thank you for this interesting consult.  I greatly enjoyed meeting Laurie [name] and look forward to participating in their care.  A copy of this report was sent to the requesting provider on this date.  Electronically Signed: Avel Peace, NP 11/12/2019, 9:20 PM   I spent a total of  40 Minutes   in face to face in clinical consultation, greater than 50% of which was counseling/coordinating care for bone marrow biopsy

## 2019-11-13 ENCOUNTER — Ambulatory Visit
Admission: RE | Admit: 2019-11-13 | Discharge: 2019-11-13 | Disposition: A | Discharge status: Home / Self Care | Payer: Medicare HMO | Source: Ambulatory Visit | Attending: Hematology and Oncology | Admitting: Hematology and Oncology

## 2019-11-13 ENCOUNTER — Other Ambulatory Visit: Payer: Self-pay

## 2019-11-13 DIAGNOSIS — D472 Monoclonal gammopathy: Secondary | ICD-10-CM | POA: Diagnosis not present

## 2019-11-13 DIAGNOSIS — C9000 Multiple myeloma not having achieved remission: Secondary | ICD-10-CM | POA: Insufficient documentation

## 2019-11-13 DIAGNOSIS — F419 Anxiety disorder, unspecified: Secondary | ICD-10-CM | POA: Insufficient documentation

## 2019-11-13 DIAGNOSIS — Z79899 Other long term (current) drug therapy: Secondary | ICD-10-CM | POA: Diagnosis not present

## 2019-11-13 DIAGNOSIS — M899 Disorder of bone, unspecified: Secondary | ICD-10-CM | POA: Insufficient documentation

## 2019-11-13 DIAGNOSIS — J45909 Unspecified asthma, uncomplicated: Secondary | ICD-10-CM | POA: Insufficient documentation

## 2019-11-13 DIAGNOSIS — D72819 Decreased white blood cell count, unspecified: Secondary | ICD-10-CM | POA: Diagnosis not present

## 2019-11-13 DIAGNOSIS — F329 Major depressive disorder, single episode, unspecified: Secondary | ICD-10-CM | POA: Diagnosis not present

## 2019-11-13 DIAGNOSIS — Z7189 Other specified counseling: Secondary | ICD-10-CM | POA: Insufficient documentation

## 2019-11-13 LAB — CBC WITH DIFFERENTIAL/PLATELET
Abs Immature Granulocytes: 0.01 10*3/uL (ref 0.00–0.07)
Basophils Absolute: 0 10*3/uL (ref 0.0–0.1)
Basophils Relative: 0 %
Eosinophils Absolute: 0.1 10*3/uL (ref 0.0–0.5)
Eosinophils Relative: 4 %
HCT: 26.3 % — ABNORMAL LOW (ref 36.0–46.0)
Hemoglobin: 8.7 g/dL — ABNORMAL LOW (ref 12.0–15.0)
Immature Granulocytes: 0 %
Lymphocytes Relative: 23 %
Lymphs Abs: 0.7 10*3/uL (ref 0.7–4.0)
MCH: 36.3 pg — ABNORMAL HIGH (ref 26.0–34.0)
MCHC: 33.1 g/dL (ref 30.0–36.0)
MCV: 109.6 fL — ABNORMAL HIGH (ref 80.0–100.0)
Monocytes Absolute: 0.2 10*3/uL (ref 0.1–1.0)
Monocytes Relative: 7 %
Neutro Abs: 2 10*3/uL (ref 1.7–7.7)
Neutrophils Relative %: 66 %
Platelets: 239 10*3/uL (ref 150–400)
RBC: 2.4 MIL/uL — ABNORMAL LOW (ref 3.87–5.11)
RDW: 14 % (ref 11.5–15.5)
WBC: 3 10*3/uL — ABNORMAL LOW (ref 4.0–10.5)
nRBC: 0 % (ref 0.0–0.2)

## 2019-11-13 LAB — PROTIME-INR
INR: 1.3 — ABNORMAL HIGH (ref 0.8–1.2)
Prothrombin Time: 16 seconds — ABNORMAL HIGH (ref 11.4–15.2)

## 2019-11-13 MED ORDER — HEPARIN SOD (PORK) LOCK FLUSH 100 UNIT/ML IV SOLN
INTRAVENOUS | Status: AC
  Filled 2019-11-13: qty 5

## 2019-11-13 MED ORDER — SODIUM CHLORIDE 0.9 % IV SOLN: INTRAVENOUS | Status: DC

## 2019-11-13 MED ORDER — FENTANYL CITRATE (PF) 100 MCG/2ML IJ SOLN
INTRAMUSCULAR | Status: AC
  Filled 2019-11-13: qty 2

## 2019-11-13 MED ORDER — MIDAZOLAM HCL 2 MG/2ML IJ SOLN
INTRAMUSCULAR | Status: AC | PRN
  Administered 2019-11-13: 1 mg via INTRAVENOUS

## 2019-11-13 MED ORDER — MIDAZOLAM HCL 2 MG/2ML IJ SOLN
INTRAMUSCULAR | Status: AC
  Filled 2019-11-13: qty 4

## 2019-11-13 MED ORDER — FENTANYL CITRATE (PF) 100 MCG/2ML IJ SOLN
INTRAMUSCULAR | Status: AC | PRN
  Administered 2019-11-13: 50 ug via INTRAVENOUS

## 2019-11-13 NOTE — Discharge Instructions (Signed)
Bone Marrow Aspiration and Bone Marrow Biopsy, Adult, Care After This sheet gives you information about how to care for yourself after your procedure. Your health care provider may also give you more specific instructions. If you have problems or questions, contact your health care provider. What can I expect after the procedure? After the procedure, it is common to have:  Mild pain and tenderness.  Swelling.  Bruising. Follow these instructions at home: Puncture site care   Follow instructions from your health care provider about how to take care of the puncture site. Make sure you: ? Wash your hands with soap and water before and after you change your bandage (dressing). If soap and water are not available, use hand sanitizer. ? Change your dressing as told by your health care provider.  Check your puncture site every day for signs of infection. Check for: ? More redness, swelling, or pain. ? Fluid or blood. ? Warmth. ? Pus or a bad smell. Activity  Return to your normal activities as told by your health care provider. Ask your health care provider what activities are safe for you.  Do not lift anything that is heavier than 10 lb (4.5 kg), or the limit that you are told, until your health care provider says that it is safe.  Do not drive for 24 hours if you were given a sedative during your procedure. General instructions   Take over-the-counter and prescription medicines only as told by your health care provider.  Do not take baths, swim, or use a hot tub until your health care provider approves. Ask your health care provider if you may take showers. You may only be allowed to take sponge baths.  If directed, put ice on the affected area. To do this: ? Put ice in a plastic bag. ? Place a towel between your skin and the bag. ? Leave the ice on for 20 minutes, 2-3 times a day.  Keep all follow-up visits as told by your health care provider. This is important. Contact a  health care provider if:  Your pain is not controlled with medicine.  You have a fever.  You have more redness, swelling, or pain around the puncture site.  You have fluid or blood coming from the puncture site.  Your puncture site feels warm to the touch.  You have pus or a bad smell coming from the puncture site. Summary  After the procedure, it is common to have mild pain, tenderness, swelling, and bruising.  Follow instructions from your health care provider about how to take care of the puncture site and what activities are safe for you.  Take over-the-counter and prescription medicines only as told by your health care provider.  Contact a health care provider if you have any signs of infection, such as fluid or blood coming from the puncture site. This information is not intended to replace advice given to you by your health care provider. Make sure you discuss any questions you have with your health care provider. Document Revised: 09/06/2018 Document Reviewed: 09/06/2018 Elsevier Patient Education  2020 [employer] Inc[employer]  

## 2019-11-13 NOTE — Procedures (Signed)
Interventional Radiology Procedure: ? ? ?Indications: Monoclonal gammopathy ? ?Procedure: CT guided bone marrow biopsy ? ?Findings: 2 aspirates and 1 core from right ilium ? ?Complications: None ?    ?EBL: Minimal, less than 10 ml ? ?Plan: Discharge to home in one hour. ? ? ?Alyssamae Klinck R. Michiah Masse, MD  ?Pager: [phone] ? ?  ?

## 2019-11-15 LAB — SURGICAL PATHOLOGY

## 2019-11-20 ENCOUNTER — Encounter (HOSPITAL_COMMUNITY): Payer: Self-pay | Admitting: Hematology and Oncology

## 2019-11-22 ENCOUNTER — Encounter: Payer: Self-pay | Admitting: Hematology and Oncology

## 2019-11-22 NOTE — Progress Notes (Signed)
Laurie [name]  [address], [address] [place], [place] [address] Phone: [phone]  Fax: [phone]   Clinic Day:  11/23/2019  Referring physician: Denton Lank, MD  Chief Complaint: Laurie [name] is a 74 y.o. female with multiple myeloma who is seen for review of interval bone marrow and discussion regarding direction of therapy.  HPI: The patient was last seen in the hematology clinic on 11/03/2019. At that time, she denied any concerns.  Work-up was reviewed.  She was felt to have multiple myeloma.  She had bone lesions.  Hemoglobin was 9.1 on 10/26/2019.  Bone marrow was discussed.  We discussed potential treatment avoiding blood products as she is Jehovah's Witness.  Bone marrow biopsy on 11/13/2019 revealed a hypercellular bone marrow with extensive involvement by plasma cell neoplasm. Atypical plasma cells represented 70% of all cells in the aspirate and was associated with prominent interstitial infiltrates and diffuse sheets in the clot.  Plasma cells were kappa light chain restricted.   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   During the interim, she has been well. She had no complaints with her recent bone marrow biopsy.  She is agreeable to try any treatment without blood products; she "has a strong will to live."   Past Medical History:  Diagnosis Date  . Anxiety   . Asthma   . Depression   . Head injury   . Osteoporosis     Past Surgical History:  Procedure Laterality Date  . ABDOMINAL HYSTERECTOMY    . HEMORROIDECTOMY    . SEPTOPLASTY      Family History  Problem Relation Age of Onset  . Breast cancer Cousin        mat cousin    Social History:  reports  that she has never smoked. She has never used smokeless tobacco. She reports that she does not drink alcohol and does not use drugs.  She denies any exposure to radiation or toxins. She denies alcohol and tobacco use. Her husband has schizophrenia and committed suicide. The patient is retired and has not worked since 29. 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  She then worked in the [employer] x 5 years until disability at age 40.  She lives in [place], [place].  She is a Sales promotion account executive Witness, and will not accept blood products.  The patient is accompanied by [name] via [place] today.   Allergies:  Allergies  Allergen Reactions  . Levofloxacin     Critical   . Other Other (See Comments)    Antihistamines    Current Medications: Current Outpatient Medications  Medication Sig Dispense Refill  . albuterol (VENTOLIN HFA) 108 (90 Base) MCG/ACT inhaler 2 inhalations every 4 (four) hours as needed.    Marland Kitchen alendronate (FOSAMAX) 70 MG tablet     . atorvastatin (LIPITOR) 20 MG tablet Take by mouth.    . docusate sodium (COLACE) 100 MG capsule Take 100 mg by mouth daily.    . fluticasone-salmeterol (ADVAIR HFA) 230-21 MCG/ACT inhaler Inhale into the lungs.    . montelukast (SINGULAIR) 10 MG tablet Take 10 mg by mouth at bedtime.    . naproxen (NAPROSYN) 500 MG tablet Take  500 mg by mouth 2 (two) times daily with a meal.    . nortriptyline (PAMELOR) 25 MG capsule Take by mouth.    . sertraline (ZOLOFT) 100 MG tablet Take 200 mg by mouth daily.     . traZODone (DESYREL) 100 MG tablet Take 25 mg by mouth at bedtime.     . Vitamin D, Ergocalciferol, (DRISDOL) 50000 UNITS CAPS capsule Take 50,000 Units by mouth every 7 (seven) days.     No current facility-administered medications for this visit.    Review of Systems  Constitutional: Positive for malaise/fatigue. Negative for chills, diaphoresis, fever and weight loss (stable).  HENT: Negative for  congestion, ear discharge, ear pain, hearing loss, nosebleeds, sinus pain, sore throat and tinnitus.   Eyes: Negative.  Negative for blurred vision.  Respiratory: Positive for shortness of breath (from asthma). Negative for cough, hemoptysis and sputum production.        Asthma.  Cardiovascular: Positive for palpitations (occasional). Negative for chest pain (from asthma) and leg swelling.  Gastrointestinal: Positive for constipation (on stool softener) and heartburn (on medication). Negative for abdominal pain, blood in stool, diarrhea, melena, nausea and vomiting.  Genitourinary: Negative.  Negative for dysuria, frequency, hematuria and urgency.  Musculoskeletal: Negative.  Negative for back pain, falls, joint pain, myalgias and neck pain.  Skin: Negative.  Negative for itching and rash.  Neurological: Positive for headaches (x 2-3 wks). Negative for dizziness, tingling, sensory change and weakness.       Occasional balance problems. Traumatic brain injury 3 years ago.  Endo/Heme/Allergies: Negative.  Does not bruise/bleed easily.  Psychiatric/Behavioral: Positive for depression (after her husband's death, improved). Negative for memory loss. The patient is not nervous/anxious and does not have insomnia.   All other systems reviewed and are negative.  Performance status (ECOG): 1  Vitals Blood pressure (!) 154/82, pulse (!) 112, temperature (!) 96.1 F (35.6 C), temperature source Tympanic, resp. rate 16, weight 208 lb 14.2 oz (94.7 kg), SpO2 97 %.   Physical Exam Vitals and nursing note reviewed.  Constitutional:      General: She is not in acute distress.    Appearance: Normal appearance. She is not diaphoretic.  HENT:     Head: Normocephalic and atraumatic.     Comments: Shoulder length gray hair.  Mask.    Ears:     Comments: Glasses.  Blue eyes. Eyes:     General: No scleral icterus.    Conjunctiva/sclera: Conjunctivae normal.  Neurological:     Mental Status: She is alert  and oriented to person, place, and time.  Psychiatric:        Mood and Affect: Mood and affect normal.        Behavior: Behavior normal.        Thought Content: Thought content normal.        Judgment: Judgment normal.    No visits with results within 3 Day(s) from this visit.  Latest known visit with results is:  [name] Visit on 11/13/2019  Component Date Value Ref Range Status  . WBC 11/13/2019 3.0* 4.0 - 10.5 K/uL Final  . RBC 11/13/2019 2.40* 3.87 - 5.11 MIL/uL Final  . Hemoglobin 11/13/2019 8.7* 12.0 - 15.0 g/dL Final  . HCT 11/13/2019 26.3* 36 - 46 % Final  . MCV 11/13/2019 109.6* 80.0 - 100.0 fL Final  . MCH 11/13/2019 36.3* 26.0 - 34.0 pg Final  . MCHC 11/13/2019 33.1  30.0 - 36.0 g/dL Final  . RDW 11/13/2019 14.0  11.5 - 15.5 % Final  . Platelets 11/13/2019 239  150 - 400 K/uL Final  . nRBC 11/13/2019 0.0  0.0 - 0.2 % Final  . Neutrophils Relative % 11/13/2019 66  % Final  . Neutro Abs 11/13/2019 2.0  1.7 - 7.7 K/uL Final  . Lymphocytes Relative 11/13/2019 23  % Final  . Lymphs Abs 11/13/2019 0.7  0.7 - 4.0 K/uL Final  . Monocytes Relative 11/13/2019 7  % Final  . Monocytes Absolute 11/13/2019 0.2  0 - 1 K/uL Final  . Eosinophils Relative 11/13/2019 4  % Final  . Eosinophils Absolute 11/13/2019 0.1  0 - 0 K/uL Final  . Basophils Relative 11/13/2019 0  % Final  . Basophils Absolute 11/13/2019 0.0  0 - 0 K/uL Final  . Immature Granulocytes 11/13/2019 0  % Final  . Abs Immature Granulocytes 11/13/2019 0.01  0.00 - 0.07 K/uL Final   Performed at [facility], [address] [place], [place] [address]  . Prothrombin Time 11/13/2019 16.0* 11.4 - 15.2 seconds Final  . INR 11/13/2019 1.3* 0.8 - 1.2 Final   Comment: (NOTE) INR goal varies based on device and disease states. Performed at [name], [address], [place], [place] [address]   . SURGICAL PATHOLOGY 11/13/2019    Final-Edited                   Value:SURGICAL PATHOLOGY CASE:  [id number] PATIENT: Laurie [name] Bone Marrow Report  Clinical History: Monoclonal gammopathy, right ilium, (ADC)  DIAGNOSIS:  BONE MARROW, ASPIRATE, CLOT, CORE: -Hypercellular bone marrow with extensive involvement by plasma cell neoplasm -See comment  PERIPHERAL BLOOD: -Macrocytic anemia -Leukopenia  COMMENT:  The bone marrow shows extensive infiltration by atypical plasma cells representing 70% of all cells in the aspirate associated with prominent interstitial infiltrates and diffuse sheets in the clot and biopsy sections.  The plasma cells display kappa light chain restriction consistent with plasma cell neoplasm.  Correlation with cytogenetic and FISH studies is recommended.  MICROSCOPIC DESCRIPTION:  PERIPHERAL BLOOD SMEAR: The red blood cells display mild anisopoikilocytosis with mild polychromasia.  Prominent rouleaux formation is seen.  The white blood cells are decreased in number with no significant mor                         phologic abnormalities.  The platelets are normal in number.  BONE MARROW ASPIRATE: Bone marrow particles present Erythroid precursors: Progressive maturation with scattered maturing precursors displaying nuclear cytoplasmic dyssynchrony Granulocytic precursors: Orderly and progressive maturation Megakaryocytes: Present with predominantly normal morphology Lymphocytes/plasma cells: The plasma cells are markedly increased in number representing 70% of all cells associated with atypical cytomorphologic features characterized by cytomegaly and/or small nucleoli significant lymphoid aggregates are not present.  TOUCH PREPARATIONS: Mixture of cell types with prominent increase in atypical plasma cells  CLOT AND BIOPSY: The sections show 70 to 90% cellularity with interstitial infiltrates and diffuse sheets of atypical plasma cells characterized by partially clumped to vesicular chromatin and prominent nucleoli.  Residual trilineage  hematopoiesis is variably present in the                          background. Immunohistochemical stain for CD138 and in situ hybridization for kappa and lambda were performed on blocks B1 and C1 with appropriate controls.  CD138 highlights the extensive plasma cell component in the bone marrow which shows kappa light chain restriction.  IRON STAIN: Iron stains are performed on a bone marrow aspirate or touch imprint smear and section of clot. The controls stained appropriately.       Storage Iron: Decreased      Ring Sideroblasts: Absent  ADDITIONAL DATA/TESTING: The specimen was sent for cytogenetic analysis and FISH for multiple myeloma and a separate report will follow  CELL COUNT DATA:  Bone Marrow count performed on 500 cells shows: Blasts:   0%   Myeloid:  17% Promyelocytes: 0%   Erythroid:     11% Myelocytes:    1%   Lymphocytes:   2% Metamyelocytes:     1%   Plasma cells:  70% Bands:    3% Neutrophils:   11%  M:E ratio:     1.5 Eosinophils:   1% Basophils:     0% Monocytes:     0%  Lab Data: CBC performed on                          11/13/19 shows: WBC: 3.0 k/uL  Neutrophils:   67% Hgb: 8.7 g/dL  Lymphocytes:   33% HCT: 26.3 %    Monocytes:     6% MCV: 109.6 fL  Eosinophils:   5% RDW: 14 % Basophils:     1% PLT: 239 k/uL  GROSS DESCRIPTION:  A: Aspirate smear  B: Received in B-plus fixative are tissue fragments measuring 0.4 x 0.3 x 0.1 cm in aggregate.  The specimen is submitted in toto.  C: Received in B-plus fixative is a 1.4 x 0.2 cm core of bone which is submitted in toto following decalcification.  [facility] 11/13/2019)  Final Diagnosis performed by Guerry Bruin, MD.   Electronically signed 11/15/2019 Technical and / or Professional components performed at [facility], [address], [place], [place] [address].  Immunohistochemistry Technical component (if applicable) was performed at [facility]. [address], [address], [place], [place] [address].   IMMUNOHISTOCHEMISTRY DISCLAIMER (if applicable): Some of these immunohistochemical stains may hav                         e been developed and the performance characteristics determine by [facility]. Some may not have been cleared or approved by the U.S. Food and Drug Administration. The FDA has determined that such clearance or approval is not necessary. This test is used for clinical purposes. It should not be regarded as investigational or for research. This laboratory is certified under the Clinical Laboratory Improvement Amendments of 1988 (CLIA-88) as qualified to perform high complexity clinical laboratory testing.  The controls stained appropriately.    Assessment:  TOSHIBA NULL is a 74 y.o. female with stage III biclonal IgA multiple myeloma.  SPEP was 5.0 gm/dL on 09/01/5989. Random urine revealed 196.6 mg/dL total protein with 48.4% M-spike.    Bone marrow biopsy on 11/13/2019 revealed a hypercellular bone marrow with extensive involvement by plasma cell neoplasm. Atypical plasma cells represented 70% of all cells in the aspirate and was associated with prominent interstitial infiltrates and diffuse sheets in the clot.  Plasma cells were kappa light chain restricted.   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  Labs on 10/03/2019 revealed a hematocrit 27.9, hemoglobin 9.4, MCV 108, platelets 287,000, WBC 3,800 (ANC 2400).   Creatinine was 0.94, albumin 3.4, and protein 11.2.  Ferritin was 82 with an iron saturation of 30% and a TIBC of 206.  Hepatitis C antibody and HIV testing were negative.    Work-up on 10/26/2019 revealed a hematocrit was 27.4,  hemoglobin 9.1, MCV 109.6, platelets 250,000, WBC 3,400 (ANC 2,100).  Retic was 2.2%.  Creatinine was 1.06 and calcium 8.6. Total protein was 11.4 and albumin 3.2.  M-spike was 5.7 gm/dL biclonal IgA protein with kappa specificity.  Kappa free light chain were 1,376.3, lambda free light chains <1.5, and ratio > 917.53 (0.26-1.65).  Beta 2 microglobulin was 10.3 (0.6 - 2.4). LDH was 102. TSH was 3.673.   24 hour UPEP on 10/30/2019 revealed 1020 mg/24 hours protein with 65.3% M-spike (665.9 mg/24 hours).  Urine free kappa light chain were 2,620.44, free lambda light chain 6.01 and ratio 436.01 (1.03-31.76).  Bone survey on 10/26/2019 revealed small lytic lesions in the skull. There was no acute fracture.  She has B12 deficiency. Vitamin B12 was 200 (low) and folate >20.0 on 10/03/2019.  She was on weekly B12 injections x 3 and is now on monthly B12 injections.  She is a Restaurant manager, fast food.  She received both doses of the COVID-19 vaccine in 08/2019.  Symptomatically, she denies any complaints.  Hematocrit is 27.4, hemoglobin 8.8, platelets 262,000, WBC 2900 (ANC 1600).  Creatinine is 1.08.  Plan: 1.   Labs today: CBC with diff, BMP. 2.   Stage III multiple myeloma  Patient has a 5.7 gm/dL biclonal IgA gammopathy with kappa specificity.  Kappa free light chains were 1,376.3 with a ratio > 917.53 (0.26-1.65).    Beta2 microglobulin was 10.3 (0.6 - 2.4).   Bone survey reveals lytic lesions in skull.  Bone marrow biopsy reviewed in detail.  She has high risk disease.   Plasma cells comprise 70% of marrow.   Cytogenetics are complex.  Discuss potential treatment plans (RVD or [address]).   Discuss potential side effects of treatment.   Patient states that she can receive Benadryl as a premed for daratumumab as it made her "restless" (not an allergy).   Discuss first 2 doses of daratumumab in [place] in case of allergic reaction.  Discuss issues with myelosuppression and inability to received  blood products.   Discuss beginning slowly and advancing treatment.  Plan Daratumumab SQ and Revlimid and Decadron   Hold Revlimid with first cycle.   If counts improve, plan initiation of Revlimid 10 mg a day with cycle #2.  Treatment plan (modified MAIA):  cycle length 28 days   Daratumumab SQ 1800 mg on days 1, 8, 15, and 22 for cycles #1 and #2.   Revlimid 10 mg po day 1-21 cycle #2.   Decadron 20 mg po days 1-2, 8-9, 15-16, and 22-23.  Cycles #3-6   Revlimid 10 mg po q day 1-21 (increase dose if tolerated).   Decadron 20 mg po day 1-2 and 15-16.   Daratumumab SQ 1800 mg day 1 and 15.  Followed by:   Revlimid 10 mg po q day 1-21 (increase dose if tolerated).   Decadron 20 mg po day 1-2, 8, 15, 22.  Prophylaxis:   Acyclovir 400 mg po BID.   Aspirin 81 mg a day when Revlimid initiated.  Supportive care:   Xgeva or Zometa.  Multiple questions asked and answered. 3.   Anemia  Hematocrit 27.7.  Hemoglobin 8.8.  MCV 112.3.  Etiology is secondary to myeloma  Patient unable to receive PRBCs.  Discuss aggressive supportive care.  Preauth Venofer.  Ferritin goal 100-400.  Preauth Retacrit.  Hemoglobin goal 11.  Initiate supportive care when available. 4.   B12 deficiency  B12 was 200 (low) on 10/03/2019.  Diet is modest.  She receives monthly B12 injections.  Check intrinsic factor and anti-parietal cell antibodies at next lab draw for pernicious anemia.  Consider monthly B12 in clinic. 5.   RN: paperwork Revlimd. 6.   Preauth dara-Rd, Venofer, Retacrit. 7.   Chemotherapy class. 8.   RTC on 11/29/2019 for MD assessment, labs (CBC with diff, CMP, SPEP, Mg, ferritin, iron studies) followed by day 1 of cycle #1 daratumumab SQ on 11/30/2019 in [place].  I discussed the assessment and treatment plan with the patient.  The patient was provided an opportunity to ask questions and all were answered.  The patient agreed with the plan and demonstrated an understanding of the instructions.   The patient was advised to call back if the symptoms worsen or if the condition fails to improve as anticipated.  I provided 20 minutes (10:24 AM - 10:44 AM) of face-to-face time during this this encounter and > 50% was spent counseling as documented under my assessment and plan.  An additional 20+ minutes were spent reviewing her chart, labs, bone marrow, writing chemotherapy, and speaking to Dr Fatima Sanger at [facility].    Cedric Mcclaine C. Mike Gip, MD, PhD    11/23/2019, 10:46 AM  I, Jacqualyn Posey, am acting as a Education administrator for [employer]. Mike Gip, MD.   I, Quamir Willemsen C. Mike Gip, MD, have reviewed the above documentation for accuracy and completeness, and I agree with the above.

## 2019-11-23 ENCOUNTER — Telehealth: Payer: Self-pay | Admitting: Pharmacy Technician

## 2019-11-23 ENCOUNTER — Inpatient Hospital Stay: Payer: Medicare HMO | Admitting: Hematology and Oncology

## 2019-11-23 ENCOUNTER — Inpatient Hospital Stay: Payer: Medicare HMO

## 2019-11-23 ENCOUNTER — Telehealth: Payer: Self-pay | Admitting: Pharmacist

## 2019-11-23 ENCOUNTER — Encounter (HOSPITAL_COMMUNITY): Payer: Self-pay | Admitting: Hematology and Oncology

## 2019-11-23 ENCOUNTER — Encounter: Payer: Self-pay | Admitting: Hematology and Oncology

## 2019-11-23 ENCOUNTER — Other Ambulatory Visit: Payer: Self-pay

## 2019-11-23 VITALS — BP 154/82 | HR 112 | Temp 96.1°F | Resp 16 | Wt 208.9 lb

## 2019-11-23 DIAGNOSIS — M899 Disorder of bone, unspecified: Secondary | ICD-10-CM

## 2019-11-23 DIAGNOSIS — E538 Deficiency of other specified B group vitamins: Secondary | ICD-10-CM | POA: Diagnosis not present

## 2019-11-23 DIAGNOSIS — D649 Anemia, unspecified: Secondary | ICD-10-CM

## 2019-11-23 DIAGNOSIS — Z5112 Encounter for antineoplastic immunotherapy: Secondary | ICD-10-CM | POA: Diagnosis not present

## 2019-11-23 DIAGNOSIS — C9000 Multiple myeloma not having achieved remission: Secondary | ICD-10-CM | POA: Diagnosis not present

## 2019-11-23 DIAGNOSIS — Z7189 Other specified counseling: Secondary | ICD-10-CM

## 2019-11-23 LAB — BASIC METABOLIC PANEL
Anion gap: 10 (ref 5–15)
BUN: 19 mg/dL (ref 8–23)
CO2: 23 mmol/L (ref 22–32)
Calcium: 8.9 mg/dL (ref 8.9–10.3)
Chloride: 102 mmol/L (ref 98–111)
Creatinine, Ser: 1.08 mg/dL — ABNORMAL HIGH (ref 0.44–1.00)
GFR calc Af Amer: 59 mL/min — ABNORMAL LOW (ref >60)
GFR calc non Af Amer: 51 mL/min — ABNORMAL LOW (ref >60)
Glucose, Bld: 101 mg/dL — ABNORMAL HIGH (ref 70–99)
Potassium: 4.6 mmol/L (ref 3.5–5.1)
Sodium: 135 mmol/L (ref 135–145)

## 2019-11-23 LAB — CBC WITH DIFFERENTIAL/PLATELET
Abs Immature Granulocytes: 0.01 10*3/uL (ref 0.00–0.07)
Basophils Absolute: 0 10*3/uL (ref 0.0–0.1)
Basophils Relative: 1 %
Eosinophils Absolute: 0.2 10*3/uL (ref 0.0–0.5)
Eosinophils Relative: 5 %
HCT: 27.4 % — ABNORMAL LOW (ref 36.0–46.0)
Hemoglobin: 8.8 g/dL — ABNORMAL LOW (ref 12.0–15.0)
Immature Granulocytes: 0 %
Lymphocytes Relative: 32 %
Lymphs Abs: 0.9 10*3/uL (ref 0.7–4.0)
MCH: 36.1 pg — ABNORMAL HIGH (ref 26.0–34.0)
MCHC: 32.1 g/dL (ref 30.0–36.0)
MCV: 112.3 fL — ABNORMAL HIGH (ref 80.0–100.0)
Monocytes Absolute: 0.2 10*3/uL (ref 0.1–1.0)
Monocytes Relative: 8 %
Neutro Abs: 1.6 10*3/uL — ABNORMAL LOW (ref 1.7–7.7)
Neutrophils Relative %: 54 %
Platelets: 262 10*3/uL (ref 150–400)
RBC: 2.44 MIL/uL — ABNORMAL LOW (ref 3.87–5.11)
RDW: 14.1 % (ref 11.5–15.5)
WBC: 2.9 10*3/uL — ABNORMAL LOW (ref 4.0–10.5)
nRBC: 0 % (ref 0.0–0.2)

## 2019-11-23 MED ORDER — ONDANSETRON HCL 8 MG PO TABS: 8.0000 mg | ORAL_TABLET | Freq: Three times a day (TID) | ORAL | 1 refills | Status: DC | PRN

## 2019-11-23 MED ORDER — DEXAMETHASONE 4 MG PO TABS: 20.0000 mg | ORAL_TABLET | ORAL | 0 refills | Status: DC

## 2019-11-23 MED ORDER — ACYCLOVIR 400 MG PO TABS: 400.0000 mg | ORAL_TABLET | Freq: Two times a day (BID) | ORAL | 11 refills | Status: DC

## 2019-11-23 NOTE — Progress Notes (Signed)
Patient consented / enrolled to the [facility] program for Revlimid. ID number in [facility]- social #

## 2019-11-23 NOTE — Patient Instructions (Signed)
Dexamethasone tablets What is this medicine? DEXAMETHASONE (dex a METH a sone) is a corticosteroid. It is commonly used to treat inflammation of the skin, joints, lungs, and other organs. Common conditions treated include asthma, allergies, and arthritis. It is also used for other conditions, such as blood disorders and diseases of the adrenal glands. This medicine may be used for other purposes; ask your health care provider or pharmacist if you have questions. COMMON BRAND NAME(S): CUSHINGS SYNDROME DIAGNOSTIC, Decadron, Dexabliss, DexPak Jr TaperPak, DexPak TaperPak, Dxevo, Hemady, HiDex, TaperDex, ZCORT, Zema-Pak, ZoDex, ZonaCort 11 Day, ZonaCort 7 Day What should I tell my health care provider before I take this medicine? They need to know if you have any of these conditions:  Cushing's syndrome  diabetes  glaucoma  heart disease  high blood pressure  infection like herpes, measles, tuberculosis, or chickenpox  kidney disease  liver disease  mental illness  myasthenia gravis  osteoporosis  previous heart attack  seizures  stomach or intestine problems  thyroid disease  an unusual or allergic reaction to dexamethasone, corticosteroids, other medicines, lactose, foods, dyes, or preservatives  pregnant or trying to get pregnant  breast-feeding How should I use this medicine? Take this medicine by mouth with a drink of water. Follow the directions on the prescription label. Take it with food or milk to avoid stomach upset. If you are taking this medicine once a day, take it in the morning. Do not take more medicine than you are told to take. Do not suddenly stop taking your medicine because you may develop a severe reaction. Your doctor will tell you how much medicine to take. If your doctor wants you to stop the medicine, the dose may be slowly lowered over time to avoid any side effects. Talk to your pediatrician regarding the use of this medicine in children.  Special care may be needed. Patients over 36 years old may have a stronger reaction and need a smaller dose. Overdosage: If you think you have taken too much of this medicine contact a poison control center or emergency room at once. NOTE: This medicine is only for you. Do not share this medicine with others. What if I miss a dose? If you miss a dose, take it as soon as you can. If it is almost time for your next dose, talk to your doctor or health care professional. You may need to miss a dose or take an extra dose. Do not take double or extra doses without advice. What may interact with this medicine? Do not take this medicine with any of the following medications:  live virus vaccines This medicine may also interact with the following medications:  aminoglutethimide  amphotericin B  aspirin and aspirin-like medicines  certain antibiotics like erythromycin, clarithromycin, and troleandomycin  certain antivirals for HIV or hepatitis  certain medicines for seizures like carbamazepine, phenobarbital, phenytoin  certain medicines to treat myasthenia gravis  cholestyramine  cyclosporine  digoxin  diuretics  ephedrine  female hormones, like estrogen or progestins and birth control pills  insulin or other medicines for diabetes  isoniazid  ketoconazole  medicines that relax muscles for surgery  mifepristone  NSAIDs, medicines for pain and inflammation, like ibuprofen or naproxen  rifampin  skin tests for allergies  thalidomide  vaccines  warfarin This list may not describe all possible interactions. Give your health care provider a list of all the medicines, herbs, non-prescription drugs, or dietary supplements you use. Also tell them if you smoke, drink alcohol,  or use illegal drugs. Some items may interact with your medicine. What should I watch for while using this medicine? Visit your health care professional for regular checks on your progress. Tell your  health care professional if your symptoms do not start to get better or if they get worse. Your condition will be monitored carefully while you are receiving this medicine. Wear a medical ID bracelet or chain. Carry a card that describes your disease and details of your medicine and dosage times. This medicine may increase your risk of getting an infection. Call your health care professional for advice if you get a fever, chills, or sore throat, or other symptoms of a cold or flu. Do not treat yourself. Try to avoid being around people who are sick. Call your health care professional if you are around anyone with measles, chickenpox, or if you develop sores or blisters that do not heal properly. If you are going to need surgery or other procedures, tell your doctor or health care professional that you have taken this medicine within the last 12 months. Ask your doctor or health care professional about your diet. You may need to lower the amount of salt you eat. This medicine may increase blood sugar. Ask your healthcare provider if changes in diet or medicines are needed if you have diabetes. What side effects may I notice from receiving this medicine? Side effects that you should report to your doctor or health care professional as soon as possible:  allergic reactions like skin rash, itching or hives, swelling of the face, lips, or tongue  bloody or black, tarry stools  changes in emotions or moods  changes in vision  confusion, excitement, restlessness  depressed mood  eye pain  hallucinations  fever or chills, cough, sore throat, pain or difficulty passing urine  muscle weakness  severe or sudden stomach or belly pain  signs and symptoms of high blood sugar such as being more thirsty or hungry or having to urinate more than normal. You may also feel very tired or have blurry vision.  signs and symptoms of infection like fever; chills; cough; sore throat; pain or trouble passing  urine  swelling of ankles, feet  unusual bruising or bleeding  wounds that do not heal Side effects that usually do not require medical attention (report to your doctor or health care professional if they continue or are bothersome):  increased appetite  increased growth of face or body hair  headache  nausea, vomiting  skin problems, acne, thin and shiny skin  trouble sleeping  weight gain This list may not describe all possible side effects. Call your doctor for medical advice about side effects. You may report side effects to FDA at 1-800-FDA-1088. Where should I keep my medicine? Keep out of the reach of children. Store at room temperature between 20 and 25 degrees C (68 and 77 degrees F). Protect from light. Throw away any unused medicine after the expiration date. NOTE: This sheet is a summary. It may not cover all possible information. If you have questions about this medicine, talk to your doctor, pharmacist, or health care provider.  2020 Elsevier/Gold Standard (2018-11-01 14:23:34)  Bortezomib injection What is this medicine? BORTEZOMIB (bor TEZ oh mib) is a medicine that targets proteins in cancer cells and stops the cancer cells from growing. It is used to treat multiple myeloma and mantle-cell lymphoma. This medicine may be used for other purposes; ask your health care provider or pharmacist if you have questions.  COMMON BRAND NAME(S): Velcade What should I tell my health care provider before I take this medicine? They need to know if you have any of these conditions:  diabetes  heart disease  irregular heartbeat  liver disease  on hemodialysis  low blood counts, like low white blood cells, platelets, or hemoglobin  peripheral neuropathy  taking medicine for blood pressure  an unusual or allergic reaction to bortezomib, mannitol, boron, other medicines, foods, dyes, or preservatives  pregnant or trying to get pregnant  breast-feeding How should  I use this medicine? This medicine is for injection into a vein or for injection under the skin. It is given by a health care professional in a hospital or clinic setting. Talk to your pediatrician regarding the use of this medicine in children. Special care may be needed. Overdosage: If you think you have taken too much of this medicine contact a poison control center or emergency room at once. NOTE: This medicine is only for you. Do not share this medicine with others. What if I miss a dose? It is important not to miss your dose. Call your doctor or health care professional if you are unable to keep an appointment. What may interact with this medicine? This medicine may interact with the following medications:  ketoconazole  rifampin  ritonavir  St. John's Wort This list may not describe all possible interactions. Give your health care provider a list of all the medicines, herbs, non-prescription drugs, or dietary supplements you use. Also tell them if you smoke, drink alcohol, or use illegal drugs. Some items may interact with your medicine. What should I watch for while using this medicine? You may get drowsy or dizzy. Do not drive, use machinery, or do anything that needs mental alertness until you know how this medicine affects you. Do not stand or sit up quickly, especially if you are an older patient. This reduces the risk of dizzy or fainting spells. In some cases, you may be given additional medicines to help with side effects. Follow all directions for their use. Call your doctor or health care professional for advice if you get a fever, chills or sore throat, or other symptoms of a cold or flu. Do not treat yourself. This drug decreases your body's ability to fight infections. Try to avoid being around people who are sick. This medicine may increase your risk to bruise or bleed. Call your doctor or health care professional if you notice any unusual bleeding. You may need blood work  done while you are taking this medicine. In some patients, this medicine may cause a serious brain infection that may cause death. If you have any problems seeing, thinking, speaking, walking, or standing, tell your doctor right away. If you cannot reach your doctor, urgently seek other source of medical care. Check with your doctor or health care professional if you get an attack of severe diarrhea, nausea and vomiting, or if you sweat a lot. The loss of too much body fluid can make it dangerous for you to take this medicine. Do not become pregnant while taking this medicine or for at least 7 months after stopping it. Women should inform their doctor if they wish to become pregnant or think they might be pregnant. Men should not father a child while taking this medicine and for at least 4 months after stopping it. There is a potential for serious side effects to an unborn child. Talk to your health care professional or pharmacist for more information. Do  not breast-feed an infant while taking this medicine or for 2 months after stopping it. This medicine may interfere with the ability to have a child. You should talk with your doctor or health care professional if you are concerned about your fertility. What side effects may I notice from receiving this medicine? Side effects that you should report to your doctor or health care professional as soon as possible:  allergic reactions like skin rash, itching or hives, swelling of the face, lips, or tongue  breathing problems  changes in hearing  changes in vision  fast, irregular heartbeat  feeling faint or lightheaded, falls  pain, tingling, numbness in the hands or feet  right upper belly pain  seizures  swelling of the ankles, feet, hands  unusual bleeding or bruising  unusually weak or tired  vomiting  yellowing of the eyes or skin Side effects that usually do not require medical attention (report to your doctor or health care  professional if they continue or are bothersome):  changes in emotions or moods  constipation  diarrhea  loss of appetite  headache  irritation at site where injected  nausea This list may not describe all possible side effects. Call your doctor for medical advice about side effects. You may report side effects to FDA at 1-800-FDA-1088. Where should I keep my medicine? This drug is given in a hospital or clinic and will not be stored at home. NOTE: This sheet is a summary. It may not cover all possible information. If you have questions about this medicine, talk to your doctor, pharmacist, or health care provider.  2020 Elsevier/Gold Standard (2017-08-30 16:29:31)   Daratumumab injection What is this medicine? DARATUMUMAB (dar a toom ue mab) is a monoclonal antibody. It is used to treat multiple myeloma. This medicine may be used for other purposes; ask your health care provider or pharmacist if you have questions. COMMON BRAND NAME(S): DARZALEX What should I tell my health care provider before I take this medicine? They need to know if you have any of these conditions:  infection (especially a virus infection such as chickenpox, herpes, or hepatitis B virus)  lung or breathing disease  an unusual or allergic reaction to daratumumab, other medicines, foods, dyes, or preservatives  pregnant or trying to get pregnant  breast-feeding How should I use this medicine? This medicine is for infusion into a vein. It is given by a health care professional in a hospital or clinic setting. Talk to your pediatrician regarding the use of this medicine in children. Special care may be needed. Overdosage: If you think you have taken too much of this medicine contact a poison control center or emergency room at once. NOTE: This medicine is only for you. Do not share this medicine with others. What if I miss a dose? Keep appointments for follow-up doses as directed. It is important not to  miss your dose. Call your doctor or health care professional if you are unable to keep an appointment. What may interact with this medicine? Interactions have not been studied. This list may not describe all possible interactions. Give your health care provider a list of all the medicines, herbs, non-prescription drugs, or dietary supplements you use. Also tell them if you smoke, drink alcohol, or use illegal drugs. Some items may interact with your medicine. What should I watch for while using this medicine? This drug may make you feel generally unwell. Report any side effects. Continue your course of treatment even though you feel  ill unless your doctor tells you to stop. This medicine can cause serious allergic reactions. To reduce your risk you may need to take medicine before treatment with this medicine. Take your medicine as directed. This medicine can affect the results of blood tests to match your blood type. These changes can last for up to 6 months after the final dose. Your healthcare provider will do blood tests to match your blood type before you start treatment. Tell all of your healthcare providers that you are being treated with this medicine before receiving a blood transfusion. This medicine can affect the results of some tests used to determine treatment response; extra tests may be needed to evaluate response. Do not become pregnant while taking this medicine or for 3 months after stopping it. Women should inform their doctor if they wish to become pregnant or think they might be pregnant. There is a potential for serious side effects to an unborn child. Talk to your health care professional or pharmacist for more information. What side effects may I notice from receiving this medicine? Side effects that you should report to your doctor or health care professional as soon as possible:  allergic reactions like skin rash, itching or hives, swelling of the face, lips, or  tongue  breathing problems  chills  cough  dizziness  feeling faint or lightheaded  headache  low blood counts - this medicine may decrease the number of white blood cells, red blood cells and platelets. You may be at increased risk for infections and bleeding.  nausea, vomiting  shortness of breath  signs of decreased platelets or bleeding - bruising, pinpoint red spots on the skin, black, tarry stools, blood in the urine  signs of decreased red blood cells - unusually weak or tired, feeling faint or lightheaded, falls  signs of infection - fever or chills, cough, sore throat, pain or difficulty passing urine  signs and symptoms of liver injury like dark yellow or brown urine; general ill feeling or flu-like symptoms; light-colored stools; loss of appetite; right upper belly pain; unusually weak or tired; yellowing of the eyes or skin Side effects that usually do not require medical attention (report to your doctor or health care professional if they continue or are bothersome):  back pain  constipation  diarrhea  joint pain  muscle cramps  pain, tingling, numbness in the hands or feet  swelling of the ankles, feet, hands  tiredness  trouble sleeping This list may not describe all possible side effects. Call your doctor for medical advice about side effects. You may report side effects to FDA at 1-800-FDA-1088. Where should I keep my medicine? This drug is given in a hospital or clinic and will not be stored at home. NOTE: This sheet is a summary. It may not cover all possible information. If you have questions about this medicine, talk to your doctor, pharmacist, or health care provider.  2020 Elsevier/Gold Standard (2018-12-27 18:10:54)   Lenalidomide Oral Capsules What is this medicine? LENALIDOMIDE (len a LID oh mide) is a chemotherapy drug that targets specific proteins within cancer cells and stops the cancer cell from growing. It is used to treat  multiple myeloma, certain types of lymphoma, and some myelodysplastic syndromes that cause severe anemia requiring blood transfusions. This medicine may be used for other purposes; ask your health care provider or pharmacist if you have questions. COMMON BRAND NAME(S): Revlimid What should I tell my health care provider before I take this medicine? They need to  know if you have any of these conditions:  blood clots in the legs or the lungs  high blood pressure  high cholesterol  infection  irregular monthly periods or menstrual cycles  kidney disease  liver disease  smoke tobacco  thyroid disease  an unusual or allergic reaction to lenalidomide, thalidomide, other medicines, foods, dyes, or preservatives  pregnant or trying to get pregnant  breast-feeding How should I use this medicine? Take this medicine by mouth with a glass of water. Follow the directions on the prescription label. Do not cut, crush, or chew this medicine. Take your medicine at regular intervals. Do not take it more often than directed. Do not stop taking except on your doctor's advice. A MedGuide will be given with each prescription and refill. Read this guide carefully each time. The MedGuide may change frequently. Talk to your pediatrician regarding the use of this medicine in children. Special care may be needed. Overdosage: If you think you have taken too much of this medicine contact a poison control center or emergency room at once. NOTE: This medicine is only for you. Do not share this medicine with others. What if I miss a dose? If you miss a dose, take it as soon as you can. If your next dose is to be taken in less than 12 hours, then do not take the missed dose. Take the next dose at your regular time. Do not take double or extra doses. What may interact with this medicine? This medicine may interact with the following medications:  digoxin  medicines that increase the risk of thrombosis like  estrogens or erythropoietic agents (e.g., epoetin alfa and darbepoetin alfa)  warfarin This list may not describe all possible interactions. Give your health care provider a list of all the medicines, herbs, non-prescription drugs, or dietary supplements you use. Also tell them if you smoke, drink alcohol, or use illegal drugs. Some items may interact with your medicine. What should I watch for while using this medicine? You may need blood work done while you are taking this medicine. This medicine may cause serious skin reactions. They can happen weeks to months after starting the medicine. Contact your health care provider right away if you notice fevers or flu-like symptoms with a rash. The rash may be red or purple and then turn into blisters or peeling of the skin. Or, you might notice a red rash with swelling of the face, lips or lymph nodes in your neck or under your arms. This medicine is available only through a special program. Doctors, pharmacies, and patients must meet all of the conditions of the program. Your health care provider will help you get signed up with the program if you need this medicine. Through the program you will only receive up to a 28 day supply of the medicine at one time. You will need a new prescription for each refill. This medicine can cause birth defects. Do not get pregnant while taking this drug. Females with child-bearing potential will need to have 2 negative pregnancy tests before starting this medicine. Pregnancy testing must be done every 2 to 4 weeks as directed while taking this medicine. Use 2 reliable forms of birth control together while you are taking this medicine and for 4 weeks after you stop taking this medicine. If you think that you might be pregnant talk to your doctor right away. Do not breast-feed an infant while taking this medicine. Men must use a latex condom during sexual contact with  a woman while taking this medicine and for 4 weeks after you  stop taking this medicine. A latex condom is needed even if you have had a vasectomy. Contact your doctor right away if your partner becomes pregnant. Do not donate sperm while taking this medicine and for 4 weeks after you stop taking this medicine. Do not give blood while taking the medicine and for 4 weeks after completion of treatment to avoid exposing pregnant women to the medicine through the donated blood. Talk to your doctor about your risk of cancer. You may be more at risk for certain types of cancers if you take this medicine. What side effects may I notice from receiving this medicine? Side effects that you should report to your doctor or health care professional as soon as possible:  allergic reactions like skin rash, itching or hives, swelling of the face, lips, or tongue  breathing problems  chest pain or tightness  fast, irregular heartbeat  feeling faint  low blood counts - this medicine may decrease the number of white blood cells, red blood cells and platelets. You may be at increased risk for infections and bleeding.  rash, fever, and swollen lymph nodes  redness, blistering, peeling or loosening of the skin, including inside the mouth  seizures  signs and symptoms of bleeding such as bloody or black, tarry stools; red or dark-brown urine; spitting up blood or brown material that looks like coffee grounds; red spots on the skin; unusual bruising or bleeding from the eye, gums, or nose  signs and symptoms of a blood clot such as breathing problems; changes in vision; chest pain; severe, sudden headache; pain, swelling, warmth in the leg; trouble speaking; sudden numbness or weakness of the face, arm or leg  signs and symptoms of liver injury like dark yellow or brown urine; general ill feeling or flu-like symptoms; light-colored stools; loss of appetite; nausea; right upper belly pain; unusually weak or tired; yellowing of the eyes or skin  signs and symptoms of a  stroke like changes in vision; confusion; trouble speaking or understanding; severe headaches; sudden numbness or weakness of the face, arm or leg; trouble walking; dizziness; loss of balance or coordination  sweating  vomiting Side effects that usually do not require medical attention (report to your doctor or health care professional if they continue or are bothersome):  constipation  cough  diarrhea  joint pain  muscle cramps  swelling of the arms, legs, or skin  tiredness  trouble sleeping This list may not describe all possible side effects. Call your doctor for medical advice about side effects. You may report side effects to FDA at 1-800-FDA-1088. Where should I keep my medicine? Keep out of the reach of children. Store at room temperature between 15 and 30 degrees C (59 and 86 degrees F). Throw away any unused medicine after the expiration date. NOTE: This sheet is a summary. It may not cover all possible information. If you have questions about this medicine, talk to your doctor, pharmacist, or health care provider.  2020 Elsevier/Gold Standard (2018-07-22 15:09:17)

## 2019-11-23 NOTE — Telephone Encounter (Signed)
Oral Oncology Pharmacist Encounter  Received new prescription for Revlimid (lenalidomide) for the treatment of multiple myeloma in conjunction with dexamethasone and bortezomib or daratumumab, planned duration until disease progression or unacceptable drug toxicity.  BMP from 11/23/19 assessed, SCr slightly elevated at 1.'08mg'$ /dL, CrCl 76m/min patient could benefit from a dose reduction in her lenalidomide starting dose . Prescription dose and frequency assessed.   Current medication list in Epic reviewed, no relevant DDIs with lenalidomide identified.  Oral Oncology Clinic will continue to follow for insurance authorization, copayment issues, initial counseling and start date.  ADarl Pikes PharmD, BCPS, BCOP, CPP Hematology/Oncology Clinical Pharmacist Practitioner [phone] 11/23/2019 12:44 PM

## 2019-11-23 NOTE — Patient Instructions (Signed)
Bortezomib injection What is this medicine? BORTEZOMIB (bor TEZ oh mib) is a medicine that targets proteins in cancer cells and stops the cancer cells from growing. It is used to treat multiple myeloma and mantle-cell lymphoma. This medicine may be used for other purposes; ask your health care provider or pharmacist if you have questions. COMMON BRAND NAME(S): Velcade What should I tell my health care provider before I take this medicine? They need to know if you have any of these conditions:  diabetes  heart disease  irregular heartbeat  liver disease  on hemodialysis  low blood counts, like low white blood cells, platelets, or hemoglobin  peripheral neuropathy  taking medicine for blood pressure  an unusual or allergic reaction to bortezomib, mannitol, boron, other medicines, foods, dyes, or preservatives  pregnant or trying to get pregnant  breast-feeding How should I use this medicine? This medicine is for injection into a vein or for injection under the skin. It is given by a health care professional in a hospital or clinic setting. Talk to your pediatrician regarding the use of this medicine in children. Special care may be needed. Overdosage: If you think you have taken too much of this medicine contact a poison control center or emergency room at once. NOTE: This medicine is only for you. Do not share this medicine with others. What if I miss a dose? It is important not to miss your dose. Call your doctor or health care professional if you are unable to keep an appointment. What may interact with this medicine? This medicine may interact with the following medications:  ketoconazole  rifampin  ritonavir  St. John's Wort This list may not describe all possible interactions. Give your health care provider a list of all the medicines, herbs, non-prescription drugs, or dietary supplements you use. Also tell them if you smoke, drink alcohol, or use illegal drugs. Some  items may interact with your medicine. What should I watch for while using this medicine? You may get drowsy or dizzy. Do not drive, use machinery, or do anything that needs mental alertness until you know how this medicine affects you. Do not stand or sit up quickly, especially if you are an older patient. This reduces the risk of dizzy or fainting spells. In some cases, you may be given additional medicines to help with side effects. Follow all directions for their use. Call your doctor or health care professional for advice if you get a fever, chills or sore throat, or other symptoms of a cold or flu. Do not treat yourself. This drug decreases your body's ability to fight infections. Try to avoid being around people who are sick. This medicine may increase your risk to bruise or bleed. Call your doctor or health care professional if you notice any unusual bleeding. You may need blood work done while you are taking this medicine. In some patients, this medicine may cause a serious brain infection that may cause death. If you have any problems seeing, thinking, speaking, walking, or standing, tell your doctor right away. If you cannot reach your doctor, urgently seek other source of medical care. Check with your doctor or health care professional if you get an attack of severe diarrhea, nausea and vomiting, or if you sweat a lot. The loss of too much body fluid can make it dangerous for you to take this medicine. Do not become pregnant while taking this medicine or for at least 7 months after stopping it. Women should inform their doctor   if they wish to become pregnant or think they might be pregnant. Men should not father a child while taking this medicine and for at least 4 months after stopping it. There is a potential for serious side effects to an unborn child. Talk to your health care professional or pharmacist for more information. Do not breast-feed an infant while taking this medicine or for 2  months after stopping it. This medicine may interfere with the ability to have a child. You should talk with your doctor or health care professional if you are concerned about your fertility. What side effects may I notice from receiving this medicine? Side effects that you should report to your doctor or health care professional as soon as possible:  allergic reactions like skin rash, itching or hives, swelling of the face, lips, or tongue  breathing problems  changes in hearing  changes in vision  fast, irregular heartbeat  feeling faint or lightheaded, falls  pain, tingling, numbness in the hands or feet  right upper belly pain  seizures  swelling of the ankles, feet, hands  unusual bleeding or bruising  unusually weak or tired  vomiting  yellowing of the eyes or skin Side effects that usually do not require medical attention (report to your doctor or health care professional if they continue or are bothersome):  changes in emotions or moods  constipation  diarrhea  loss of appetite  headache  irritation at site where injected  nausea This list may not describe all possible side effects. Call your doctor for medical advice about side effects. You may report side effects to FDA at 1-800-FDA-1088. Where should I keep my medicine? This drug is given in a hospital or clinic and will not be stored at home. NOTE: This sheet is a summary. It may not cover all possible information. If you have questions about this medicine, talk to your doctor, pharmacist, or health care provider.  2020 Elsevier/Gold Standard (2017-08-30 16:29:31)  Lenalidomide Oral Capsules What is this medicine? LENALIDOMIDE (len a LID oh mide) is a chemotherapy drug that targets specific proteins within cancer cells and stops the cancer cell from growing. It is used to treat multiple myeloma, certain types of lymphoma, and some myelodysplastic syndromes that cause severe anemia requiring blood  transfusions. This medicine may be used for other purposes; ask your health care provider or pharmacist if you have questions. COMMON BRAND NAME(S): Revlimid What should I tell my health care provider before I take this medicine? They need to know if you have any of these conditions:  blood clots in the legs or the lungs  high blood pressure  high cholesterol  infection  irregular monthly periods or menstrual cycles  kidney disease  liver disease  smoke tobacco  thyroid disease  an unusual or allergic reaction to lenalidomide, thalidomide, other medicines, foods, dyes, or preservatives  pregnant or trying to get pregnant  breast-feeding How should I use this medicine? Take this medicine by mouth with a glass of water. Follow the directions on the prescription label. Do not cut, crush, or chew this medicine. Take your medicine at regular intervals. Do not take it more often than directed. Do not stop taking except on your doctor's advice. A MedGuide will be given with each prescription and refill. Read this guide carefully each time. The MedGuide may change frequently. Talk to your pediatrician regarding the use of this medicine in children. Special care may be needed. Overdosage: If you think you have taken too much   of this medicine contact a poison control center or emergency room at once. NOTE: This medicine is only for you. Do not share this medicine with others. What if I miss a dose? If you miss a dose, take it as soon as you can. If your next dose is to be taken in less than 12 hours, then do not take the missed dose. Take the next dose at your regular time. Do not take double or extra doses. What may interact with this medicine? This medicine may interact with the following medications:  digoxin  medicines that increase the risk of thrombosis like estrogens or erythropoietic agents (e.g., epoetin alfa and darbepoetin alfa)  warfarin This list may not describe all  possible interactions. Give your health care provider a list of all the medicines, herbs, non-prescription drugs, or dietary supplements you use. Also tell them if you smoke, drink alcohol, or use illegal drugs. Some items may interact with your medicine. What should I watch for while using this medicine? You may need blood work done while you are taking this medicine. This medicine may cause serious skin reactions. They can happen weeks to months after starting the medicine. Contact your health care provider right away if you notice fevers or flu-like symptoms with a rash. The rash may be red or purple and then turn into blisters or peeling of the skin. Or, you might notice a red rash with swelling of the face, lips or lymph nodes in your neck or under your arms. This medicine is available only through a special program. Doctors, pharmacies, and patients must meet all of the conditions of the program. Your health care provider will help you get signed up with the program if you need this medicine. Through the program you will only receive up to a 28 day supply of the medicine at one time. You will need a new prescription for each refill. This medicine can cause birth defects. Do not get pregnant while taking this drug. Females with child-bearing potential will need to have 2 negative pregnancy tests before starting this medicine. Pregnancy testing must be done every 2 to 4 weeks as directed while taking this medicine. Use 2 reliable forms of birth control together while you are taking this medicine and for 4 weeks after you stop taking this medicine. If you think that you might be pregnant talk to your doctor right away. Do not breast-feed an infant while taking this medicine. Men must use a latex condom during sexual contact with a woman while taking this medicine and for 4 weeks after you stop taking this medicine. A latex condom is needed even if you have had a vasectomy. Contact your doctor right away if  your partner becomes pregnant. Do not donate sperm while taking this medicine and for 4 weeks after you stop taking this medicine. Do not give blood while taking the medicine and for 4 weeks after completion of treatment to avoid exposing pregnant women to the medicine through the donated blood. Talk to your doctor about your risk of cancer. You may be more at risk for certain types of cancers if you take this medicine. What side effects may I notice from receiving this medicine? Side effects that you should report to your doctor or health care professional as soon as possible:  allergic reactions like skin rash, itching or hives, swelling of the face, lips, or tongue  breathing problems  chest pain or tightness  fast, irregular heartbeat  feeling faint  low   counts - this medicine may decrease the number of white blood cells, red blood cells and platelets. You may be at increased risk for infections and bleeding.  rash, fever, and swollen lymph nodes  redness, blistering, peeling or loosening of the skin, including inside the mouth  seizures  signs and symptoms of bleeding such as bloody or black, tarry stools; red or dark-brown urine; spitting up blood or brown material that looks like coffee grounds; red spots on the skin; unusual bruising or bleeding from the eye, gums, or nose  signs and symptoms of a blood clot such as breathing problems; changes in vision; chest pain; severe, sudden headache; pain, swelling, warmth in the leg; trouble speaking; sudden numbness or weakness of the face, arm or leg  signs and symptoms of liver injury like dark yellow or brown urine; general ill feeling or flu-like symptoms; light-colored stools; loss of appetite; nausea; right upper belly pain; unusually weak or tired; yellowing of the eyes or skin  signs and symptoms of a stroke like changes in vision; confusion; trouble speaking or understanding; severe headaches; sudden numbness or weakness  of the face, arm or leg; trouble walking; dizziness; loss of balance or coordination  sweating  vomiting Side effects that usually do not require medical attention (report to your doctor or health care professional if they continue or are bothersome):  constipation  cough  diarrhea  joint pain  muscle cramps  swelling of the arms, legs, or skin  tiredness  trouble sleeping This list may not describe all possible side effects. Call your doctor for medical advice about side effects. You may report side effects to FDA at 1-800-FDA-1088. Where should I keep my medicine? Keep out of the reach of children. Store at room temperature between 15 and 30 degrees C (59 and 86 degrees F). Throw away any unused medicine after the expiration date. NOTE: This sheet is a summary. It may not cover all possible information. If you have questions about this medicine, talk to your doctor, pharmacist, or health care provider.  2020 Elsevier/Gold Standard (2018-07-22 15:09:17) Dexamethasone tablets What is this medicine? DEXAMETHASONE (dex a METH a sone) is a corticosteroid. It is commonly used to treat inflammation of the skin, joints, lungs, and other organs. Common conditions treated include asthma, allergies, and arthritis. It is also used for other conditions, such as blood disorders and diseases of the adrenal glands. This medicine may be used for other purposes; ask your health care provider or pharmacist if you have questions. COMMON BRAND NAME(S): CUSHINGS SYNDROME DIAGNOSTIC, Decadron, Dexabliss, DexPak Jr TaperPak, DexPak TaperPak, Dxevo, Hemady, HiDex, TaperDex, ZCORT, Zema-Pak, ZoDex, ZonaCort 11 Day, ZonaCort 7 Day What should I tell my health care provider before I take this medicine? They need to know if you have any of these conditions:  Cushing's syndrome  diabetes  glaucoma  heart disease  high blood pressure  infection like herpes, measles, tuberculosis, or  chickenpox  kidney disease  liver disease  mental illness  myasthenia gravis  osteoporosis  previous heart attack  seizures  stomach or intestine problems  thyroid disease  an unusual or allergic reaction to dexamethasone, corticosteroids, other medicines, lactose, foods, dyes, or preservatives  pregnant or trying to get pregnant  breast-feeding How should I use this medicine? Take this medicine by mouth with a drink of water. Follow the directions on the prescription label. Take it with food or milk to avoid stomach upset. If you are taking this medicine once a day, take  it in the morning. Do not take more medicine than you are told to take. Do not suddenly stop taking your medicine because you may develop a severe reaction. Your doctor will tell you how much medicine to take. If your doctor wants you to stop the medicine, the dose may be slowly lowered over time to avoid any side effects. Talk to your pediatrician regarding the use of this medicine in children. Special care may be needed. Patients over 71 years old may have a stronger reaction and need a smaller dose. Overdosage: If you think you have taken too much of this medicine contact a poison control center or emergency room at once. NOTE: This medicine is only for you. Do not share this medicine with others. What if I miss a dose? If you miss a dose, take it as soon as you can. If it is almost time for your next dose, talk to your doctor or health care professional. You may need to miss a dose or take an extra dose. Do not take double or extra doses without advice. What may interact with this medicine? Do not take this medicine with any of the following medications:  live virus vaccines This medicine may also interact with the following medications:  aminoglutethimide  amphotericin B  aspirin and aspirin-like medicines  certain antibiotics like erythromycin, clarithromycin, and troleandomycin  certain  antivirals for HIV or hepatitis  certain medicines for seizures like carbamazepine, phenobarbital, phenytoin  certain medicines to treat myasthenia gravis  cholestyramine  cyclosporine  digoxin  diuretics  ephedrine  female hormones, like estrogen or progestins and birth control pills  insulin or other medicines for diabetes  isoniazid  ketoconazole  medicines that relax muscles for surgery  mifepristone  NSAIDs, medicines for pain and inflammation, like ibuprofen or naproxen  rifampin  skin tests for allergies  thalidomide  vaccines  warfarin This list may not describe all possible interactions. Give your health care provider a list of all the medicines, herbs, non-prescription drugs, or dietary supplements you use. Also tell them if you smoke, drink alcohol, or use illegal drugs. Some items may interact with your medicine. What should I watch for while using this medicine? Visit your health care professional for regular checks on your progress. Tell your health care professional if your symptoms do not start to get better or if they get worse. Your condition will be monitored carefully while you are receiving this medicine. Wear a medical ID bracelet or chain. Carry a card that describes your disease and details of your medicine and dosage times. This medicine may increase your risk of getting an infection. Call your health care professional for advice if you get a fever, chills, or sore throat, or other symptoms of a cold or flu. Do not treat yourself. Try to avoid being around people who are sick. Call your health care professional if you are around anyone with measles, chickenpox, or if you develop sores or blisters that do not heal properly. If you are going to need surgery or other procedures, tell your doctor or health care professional that you have taken this medicine within the last 12 months. Ask your doctor or health care professional about your diet. You may  need to lower the amount of salt you eat. This medicine may increase blood sugar. Ask your healthcare provider if changes in diet or medicines are needed if you have diabetes. What side effects may I notice from receiving this medicine? Side effects that you should  report to your doctor or health care professional as soon as possible:  allergic reactions like skin rash, itching or hives, swelling of the face, lips, or tongue  bloody or black, tarry stools  changes in emotions or moods  changes in vision  confusion, excitement, restlessness  depressed mood  eye pain  hallucinations  fever or chills, cough, sore throat, pain or difficulty passing urine  muscle weakness  severe or sudden stomach or belly pain  signs and symptoms of high blood sugar such as being more thirsty or hungry or having to urinate more than normal. You may also feel very tired or have blurry vision.  signs and symptoms of infection like fever; chills; cough; sore throat; pain or trouble passing urine  swelling of ankles, feet  unusual bruising or bleeding  wounds that do not heal Side effects that usually do not require medical attention (report to your doctor or health care professional if they continue or are bothersome):  increased appetite  increased growth of face or body hair  headache  nausea, vomiting  skin problems, acne, thin and shiny skin  trouble sleeping  weight gain This list may not describe all possible side effects. Call your doctor for medical advice about side effects. You may report side effects to FDA at 1-800-FDA-1088. Where should I keep my medicine? Keep out of the reach of children. Store at room temperature between 20 and 25 degrees C (68 and 77 degrees F). Protect from light. Throw away any unused medicine after the expiration date. NOTE: This sheet is a summary. It may not cover all possible information. If you have questions about this medicine, talk to your  doctor, pharmacist, or health care provider.  2020 Elsevier/Gold Standard (2018-11-01 14:23:34)

## 2019-11-23 NOTE — Telephone Encounter (Signed)
Oral Oncology Patient Advocate Encounter  Prior Authorization for Revlimid has been approved.    PA# [id number]  Effective dates: 05/05/19 through 05/03/20  Patients co-pay is $8.58.  Oral Oncology Clinic will continue to follow.   [place] Patient [place] Phone [phone] Fax [phone] 11/23/2019 1:10 PM

## 2019-11-23 NOTE — Telephone Encounter (Signed)
Oral Oncology Patient Advocate Encounter  Received notification from [employer] that prior authorization for Revlimid is required.  PA submitted on CoverMyMeds Key [id number] Status is pending  Oral Oncology Clinic will continue to follow.  [place] Patient [place] Phone [phone] Fax [phone] 11/23/2019 1:03 PM

## 2019-11-23 NOTE — Progress Notes (Signed)
START ON PATHWAY REGIMEN - Multiple Myeloma and Other Plasma Cell Dyscrasias     Cycles 1 and 2: A cycle is every 28 days:     Lenalidomide      Dexamethasone      Daratumumab and hyaluronidase-fihj    Cycles 3 through 6: A cycle is every 28 days:     Lenalidomide      Dexamethasone      Daratumumab and hyaluronidase-fihj    Cycles 7 and beyond: A cycle is every 28 days:     Lenalidomide      Dexamethasone      Daratumumab and hyaluronidase-fihj   **Always confirm dose/schedule in your pharmacy ordering system**  Patient Characteristics: Multiple Myeloma, Newly Diagnosed, Transplant Ineligible or Refused, High Risk Disease Classification: Multiple Myeloma R-ISS Staging: III Therapeutic Status: Newly Diagnosed Is Patient Eligible for Transplant<= Transplant Ineligible or Refused Risk Status: High Risk Intent of Therapy: Non-Curative / Palliative Intent, Discussed with Patient 

## 2019-11-24 ENCOUNTER — Encounter: Payer: Self-pay | Admitting: *Deleted

## 2019-11-24 ENCOUNTER — Telehealth: Payer: Self-pay | Admitting: *Deleted

## 2019-11-24 ENCOUNTER — Other Ambulatory Visit: Payer: Self-pay

## 2019-11-24 ENCOUNTER — Other Ambulatory Visit: Payer: Self-pay | Admitting: Hematology and Oncology

## 2019-11-24 DIAGNOSIS — D649 Anemia, unspecified: Secondary | ICD-10-CM

## 2019-11-24 DIAGNOSIS — C9000 Multiple myeloma not having achieved remission: Secondary | ICD-10-CM

## 2019-11-24 DIAGNOSIS — D539 Nutritional anemia, unspecified: Secondary | ICD-10-CM

## 2019-11-24 LAB — FERRITIN: Ferritin: 70 ng/mL (ref 11–307)

## 2019-11-24 LAB — IRON AND TIBC
Iron: 72 ug/dL (ref 28–170)
Saturation Ratios: 34 % — ABNORMAL HIGH (ref 10.4–31.8)
TIBC: 210 ug/dL — ABNORMAL LOW (ref 250–450)
UIBC: 138 ug/dL

## 2019-11-24 MED ORDER — LENALIDOMIDE 10 MG PO CAPS: 10.0000 mg | ORAL_CAPSULE | Freq: Every day | ORAL | 0 refills | Status: AC

## 2019-11-24 MED ORDER — LENALIDOMIDE 10 MG PO CAPS: 10.0000 mg | ORAL_CAPSULE | Freq: Every day | ORAL | 0 refills | Status: DC

## 2019-11-24 NOTE — Telephone Encounter (Signed)
Treatment plan is in. Revlimid 10 mg a day 1-21 on a day day cycle will begin with cycle #2 if her counts are good.  Chemo orders and IV iron and Retacrit are in for the patient. I would like to start treatment (Dara-RD) next week. The first cycle, she will not receive Revlimid. We will need to get labs before starting the IV iron and Retacrit. We are being aggressive to keep her HCT up as she cannot receive a transfusion.  Per v/o Dr. Mike Gip. Nira Conn, can you make sure she has an appt with me next week in [place] with an appt for her 1st daratumumab the following day in [place]? Because of the risk of reaction with daratumumab, decision was made that the first 2 doses are given to all patients in [place]. I want to check her ferritin and iron stores and give iron and or Retacrit when first available.

## 2019-11-24 NOTE — Telephone Encounter (Signed)
Left detailed voice mail for patient re: her apts. Also sent my chart msg.

## 2019-11-24 NOTE — Telephone Encounter (Signed)
Per v/o Dr. Mike Gip, pt's Revlimid script escribed to [facility]. Revlimid 10 mg daily for 21 days (1 week off). Pt will take the revlimid in combination of cycle #2 of daratumumab.  Judy/Alyson/Elizabeth - please follow-up with [facility] for shipment/and insurance copay's as needed.

## 2019-11-27 ENCOUNTER — Other Ambulatory Visit: Payer: Self-pay | Admitting: Hematology and Oncology

## 2019-11-27 ENCOUNTER — Inpatient Hospital Stay: Payer: Medicare HMO

## 2019-11-27 ENCOUNTER — Inpatient Hospital Stay (HOSPITAL_BASED_OUTPATIENT_CLINIC_OR_DEPARTMENT_OTHER): Payer: Medicare HMO | Admitting: Oncology

## 2019-11-27 DIAGNOSIS — D539 Nutritional anemia, unspecified: Secondary | ICD-10-CM

## 2019-11-27 DIAGNOSIS — D649 Anemia, unspecified: Secondary | ICD-10-CM

## 2019-11-27 DIAGNOSIS — C9000 Multiple myeloma not having achieved remission: Secondary | ICD-10-CM

## 2019-11-27 DIAGNOSIS — Z5112 Encounter for antineoplastic immunotherapy: Secondary | ICD-10-CM | POA: Diagnosis not present

## 2019-11-27 LAB — CBC WITH DIFFERENTIAL/PLATELET
Abs Immature Granulocytes: 0.01 10*3/uL (ref 0.00–0.07)
Basophils Absolute: 0 10*3/uL (ref 0.0–0.1)
Basophils Relative: 0 %
Eosinophils Absolute: 0.1 10*3/uL (ref 0.0–0.5)
Eosinophils Relative: 4 %
HCT: 28.5 % — ABNORMAL LOW (ref 36.0–46.0)
Hemoglobin: 9.2 g/dL — ABNORMAL LOW (ref 12.0–15.0)
Immature Granulocytes: 0 %
Lymphocytes Relative: 26 %
Lymphs Abs: 1 10*3/uL (ref 0.7–4.0)
MCH: 35.9 pg — ABNORMAL HIGH (ref 26.0–34.0)
MCHC: 32.3 g/dL (ref 30.0–36.0)
MCV: 111.3 fL — ABNORMAL HIGH (ref 80.0–100.0)
Monocytes Absolute: 0.3 10*3/uL (ref 0.1–1.0)
Monocytes Relative: 7 %
Neutro Abs: 2.3 10*3/uL (ref 1.7–7.7)
Neutrophils Relative %: 63 %
Platelets: 257 10*3/uL (ref 150–400)
RBC: 2.56 MIL/uL — ABNORMAL LOW (ref 3.87–5.11)
RDW: 14.2 % (ref 11.5–15.5)
WBC: 3.7 10*3/uL — ABNORMAL LOW (ref 4.0–10.5)
nRBC: 0 % (ref 0.0–0.2)

## 2019-11-27 LAB — IRON AND TIBC
Iron: 64 ug/dL (ref 28–170)
Saturation Ratios: 29 % (ref 10.4–31.8)
TIBC: 223 ug/dL — ABNORMAL LOW (ref 250–450)
UIBC: 159 ug/dL

## 2019-11-27 LAB — COMPREHENSIVE METABOLIC PANEL
ALT: 15 U/L (ref 0–44)
AST: 19 U/L (ref 15–41)
Albumin: 3.3 g/dL — ABNORMAL LOW (ref 3.5–5.0)
Alkaline Phosphatase: 51 U/L (ref 38–126)
Anion gap: 10 (ref 5–15)
BUN: 27 mg/dL — ABNORMAL HIGH (ref 8–23)
CO2: 24 mmol/L (ref 22–32)
Calcium: 8.9 mg/dL (ref 8.9–10.3)
Chloride: 102 mmol/L (ref 98–111)
Creatinine, Ser: 1.18 mg/dL — ABNORMAL HIGH (ref 0.44–1.00)
GFR calc Af Amer: 53 mL/min — ABNORMAL LOW (ref >60)
GFR calc non Af Amer: 46 mL/min — ABNORMAL LOW (ref >60)
Glucose, Bld: 107 mg/dL — ABNORMAL HIGH (ref 70–99)
Potassium: 4.6 mmol/L (ref 3.5–5.1)
Sodium: 136 mmol/L (ref 135–145)
Total Bilirubin: 0.6 mg/dL (ref 0.3–1.2)
Total Protein: 12 g/dL — ABNORMAL HIGH (ref 6.5–8.1)

## 2019-11-27 LAB — FERRITIN: Ferritin: 59 ng/mL (ref 11–307)

## 2019-11-27 LAB — MAGNESIUM: Magnesium: 2.2 mg/dL (ref 1.7–2.4)

## 2019-11-27 NOTE — Progress Notes (Signed)
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      Interval history-Laurie [name] is a 74 year old female who presents to chemo care clinic today for initial meeting in preparation for starting chemotherapy. I introduced the chemo care clinic and we discussed that the role of the clinic is to assist those who are at an increased risk of emergency room visits and/or complications during the course of chemotherapy treatment. We discussed that the increased risk takes into account factors such as age, performance status, and co-morbidities. We also discussed that for some, this might include barriers to care such as not having a primary care provider, lack of insurance/transportation, or not being able to afford medications. We discussed that the goal of the program is to help prevent unplanned ER visits and help reduce complications during chemotherapy. We do this by  discussing specific risk factors to each individual and identifying ways that we can help improve these risk factors and reduce barriers to care.   ECOG FS:0 - Asymptomatic  Review of systems- Review of Systems  Constitutional: Negative.  Negative for chills, fever, malaise/fatigue and weight loss.  HENT: Negative for congestion, ear pain and tinnitus.   Eyes: Negative.  Negative for blurred vision and double vision.  Respiratory: Negative.  Negative for cough, sputum production and shortness of breath.   Cardiovascular: Negative.  Negative for chest pain, palpitations and leg swelling.  Gastrointestinal: Negative.  Negative for abdominal pain, constipation, diarrhea, nausea and vomiting.  Genitourinary: Negative for dysuria, frequency and urgency.  Musculoskeletal: Negative for back pain and falls.  Skin: Negative.  Negative for rash.  Neurological: Negative.  Negative for weakness and headaches.  Endo/Heme/Allergies: Negative.  Does not bruise/bleed easily.  Psychiatric/Behavioral: Negative.  Negative for depression. The patient is not nervous/anxious and does not have insomnia.      Current treatment- revlimid/dex and daratumumab  Allergies  Allergen Reactions  . Levofloxacin     Critical   . Other Other (See Comments)    Antihistamines    Past Medical History:  Diagnosis Date  . Anxiety   . Asthma   . Depression   . Head injury   . Osteoporosis     Past Surgical History:  Procedure Laterality Date  . ABDOMINAL HYSTERECTOMY    . HEMORROIDECTOMY    . SEPTOPLASTY      Social History   Socioeconomic History  . Marital status: Widowed    Spouse name: Not on file  . Number of children: Not on file  . Years of education:  Not on file  . Highest education level: Not on file  Occupational History  . Not on file  Tobacco Use  . Smoking status: Never Smoker  . Smokeless tobacco: Never Used  Substance and Sexual Activity  . Alcohol use: No  . Drug use: No  .  Sexual activity: Not on file  Other Topics Concern  . Not on file  Social History Narrative  . Not on file   Social Determinants of Health   Financial Resource Strain:   . Difficulty of Paying Living Expenses:   Food Insecurity:   . Worried About Charity fundraiser in the Last Year:   . Arboriculturist in the Last Year:   Transportation Needs:   . Film/video editor (Medical):   Marland Kitchen Lack of Transportation (Non-Medical):   Physical Activity:   . Days of Exercise per Week:   . Minutes of Exercise per Session:   Stress:   . Feeling of Stress :   Social Connections:   . Frequency of Communication with Friends and Family:   . Frequency of Social Gatherings with Friends and Family:   . Attends Religious Services:   . Active Member of Clubs or Organizations:   . Attends Archivist Meetings:   Marland Kitchen Marital Status:   Intimate Partner Violence:   . Fear of Current or Ex-Partner:   . Emotionally Abused:   Marland Kitchen Physically Abused:   . Sexually Abused:     Family History  Problem Relation Age of Onset  . Breast cancer Cousin        mat cousin     Current Outpatient Medications:  .  acyclovir (ZOVIRAX) 400 MG tablet, Take 1 tablet (400 mg total) by mouth 2 (two) times daily., Disp: 60 tablet, Rfl: 11 .  albuterol (VENTOLIN HFA) 108 (90 Base) MCG/ACT inhaler, 2 inhalations every 4 (four) hours as needed., Disp: , Rfl:  .  alendronate (FOSAMAX) 70 MG tablet, , Disp: , Rfl:  .  atorvastatin (LIPITOR) 20 MG tablet, Take by mouth., Disp: , Rfl:  .  dexamethasone (DECADRON) 4 MG tablet, Take 5 tablets (20 mg total) by mouth once a week. Take 5 tablets ('20mg'$ ) weekly the day after daratumumab for 8 weeks. Take with breakfast., Disp: 40 tablet, Rfl: 0 .  docusate sodium (COLACE) 100 MG capsule, Take 100 mg by mouth daily., Disp: , Rfl:  .  fluticasone-salmeterol (ADVAIR HFA) 230-21 MCG/ACT inhaler, Inhale into the lungs., Disp: , Rfl:  .  lenalidomide (REVLIMID) 10 MG capsule, Take 1  capsule (10 mg total) by mouth daily for 21 days. Take for 3 weeks; and one week off., Disp: 21 capsule, Rfl: 0 .  montelukast (SINGULAIR) 10 MG tablet, Take 10 mg by mouth at bedtime., Disp: , Rfl:  .  naproxen (NAPROSYN) 500 MG tablet, Take 500 mg by mouth 2 (two) times daily with a meal., Disp: , Rfl:  .  nortriptyline (PAMELOR) 25 MG capsule, Take by mouth., Disp: , Rfl:  .  ondansetron (ZOFRAN) 8 MG tablet, Take 1 tablet (8 mg total) by mouth every 8 (eight) hours as needed (Nausea or vomiting)., Disp: 30 tablet, Rfl: 1 .  sertraline (ZOLOFT) 100 MG tablet, Take 200 mg by mouth daily. , Disp: , Rfl:  .  traZODone (DESYREL) 100 MG tablet, Take 25 mg by mouth at bedtime. , Disp: , Rfl:  .  Vitamin D, Ergocalciferol, (DRISDOL) 50000 UNITS CAPS capsule, Take 50,000 Units by mouth every 7 (  seven) days., Disp: , Rfl:   Physical exam: There were no vitals filed for this visit. Physical Exam Constitutional:      Appearance: Normal appearance.  HENT:     Head: Normocephalic and atraumatic.  Eyes:     Pupils: Pupils are equal, round, and reactive to light.  Cardiovascular:     Rate and Rhythm: Normal rate and regular rhythm.     Heart sounds: Normal heart sounds. No murmur heard.   Pulmonary:     Effort: Pulmonary effort is normal.     Breath sounds: Normal breath sounds. No wheezing.  Abdominal:     General: Bowel sounds are normal. There is no distension.     Palpations: Abdomen is soft.     Tenderness: There is no abdominal tenderness.  Musculoskeletal:        General: Normal range of motion.     Cervical back: Normal range of motion.  Skin:    General: Skin is warm and dry.     Findings: No rash.  Neurological:     Mental Status: She is alert and oriented to person, place, and time.  Psychiatric:        Judgment: Judgment normal.      CMP Latest Ref Rng & Units 11/23/2019  Glucose 70 - 99 mg/dL 101(H)  BUN 8 - 23 mg/dL 19  Creatinine 0.44 - 1.00 mg/dL 1.08(H)  Sodium 135 -  145 mmol/L 135  Potassium 3.5 - 5.1 mmol/L 4.6  Chloride 98 - 111 mmol/L 102  CO2 22 - 32 mmol/L 23  Calcium 8.9 - 10.3 mg/dL 8.9  Total Protein 6.5 - 8.1 g/dL -  Total Bilirubin 0.3 - 1.2 mg/dL -  Alkaline Phos 38 - 126 U/L -  AST 15 - 41 U/L -  ALT 0 - 44 U/L -   CBC Latest Ref Rng & Units 11/23/2019  WBC 4.0 - 10.5 K/uL 2.9(L)  Hemoglobin 12.0 - 15.0 g/dL 8.8(L)  Hematocrit 36 - 46 % 27.4(L)  Platelets 150 - 400 K/uL 262    No images are attached to the encounter.  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 Request for bone marrow biopsy. EXAM: CT GUIDED BONE MARROW ASPIRATES AND BIOPSY Physician: Stephan Minister. Anselm Pancoast, MD MEDICATIONS: None. ANESTHESIA/SEDATION: Fentanyl 50 mcg IV; Versed 1.0 mg IV Moderate Sedation Time:  17 minutes The patient was continuously monitored during the procedure by the interventional radiology nurse under my direct supervision. COMPLICATIONS: None immediate. PROCEDURE: The procedure was explained to the patient. The risks and benefits of the procedure were discussed and the patient's questions were addressed. Informed consent was obtained from the patient. The patient was placed prone on CT table. Images of the pelvis were obtained. The right side of back was prepped and draped in sterile fashion. The skin and right posterior ilium were anesthetized with 1% lidocaine. 11 gauge bone needle was directed into the right ilium with CT guidance. Two aspirates and one core biopsy were obtained. Bandage placed over the puncture site. IMPRESSION: CT guided bone marrow aspiration and core biopsy. Electronically Signed   By: Markus Daft M.D.   On: 11/13/2019 10:32     Assessment and plan- Patient is a 74 y.o. female who presents to [facility] for initial meeting in preparation for starting chemotherapy for the treatment of multiple myeloma.   1. 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 Mike Gip by her PCP.  Work-up included a bone marrow biopsy which confirmed atypical plasma cells greater than 70% confirming above-stated disease.  Plan is to begin treatment with daratumumab, Revlimid and Decadron.  Patient is a Jehovah witness and will not accept blood products.  She also has history of anemia.   2. Chemo Care Clinic/High Risk for ER/Hospitalization during chemotherapy- We discussed the role of the chemo care clinic and identified patient specific risk factors. I discussed that patient was identified as high risk primarily based on: Comorbidities and stage of disease.  Patient has past medical history positive for: Past Medical History:  Diagnosis Date  . Anxiety   . Asthma   . Depression   . Head injury   . Osteoporosis     Patient has past surgical history positive for: Past Surgical History:  Procedure Laterality Date  . ABDOMINAL HYSTERECTOMY    . HEMORROIDECTOMY    . SEPTOPLASTY      Based on our high risk symptom management report; this patient has a high risk of ED utilization.  The percentage below indicates how "at risk "  this patient based on the factors in this table within one year.   General Risk Score: 2  Values used to calculate this score:   Points  Metrics      1        Age: [facility] Admissions: 0      0        ED Visits: 0      0        Has Chronic Obstructive Pulmonary Disease: No      0        Has Diabetes: No      0        Has Congestive Heart Failure: No      0        Has liver disease: No      1        Has Depression: Yes      0        Current PCP: Denton Lank, MD      0        Has Medicaid: No  3. We discussed that social determinants of health may have significant impacts on health and outcomes for cancer patients.  Today we discussed specific social determinants of performance status, alcohol use, depression, financial needs, food insecurity, housing, interpersonal  violence, social connections, stress, tobacco use, and transportation.    After lengthy discussion the following were identified as areas of need: None at this time  Outpatient services: We discussed options including home based and outpatient services, DME and care program. We discusssed that patients who participate in regular physical activity report fewer negative impacts of cancer and treatments and report less fatigue.   Financial Concerns: We discussed that living with cancer can create tremendous financial burden.  We discussed options for assistance. I asked that if assistance is needed in affording medications or paying bills to please let us know so that we can provide assistance. We discussed options for food including social services, [employer] ($50 every 2 weeks) and onsite food pantry.  We will also notify [facility] to see if cancer center can provide additional support.  Referral to Social work: Introduced Education officer, museum Elease Etienne and the services he can provide such as support with [employer], cell phone  and gas vouchers.   Support groups: We discussed options for support groups at the cancer center. If interested, please notify nurse navigator to enroll. We discussed options for managing stress including healthy eating, exercise as well as participating in no charge counseling services at the cancer center and support groups.  If these are of interest, patient can notify either myself or primary nursing team.We discussed options for management including medications and referral to quit Smart program  Transportation: We discussed options for transportation including acta, paratransit, bus routes, link transit, taxi/uber/lyft, and cancer center [place].  I have notified primary oncology team who will help assist with arranging Lucianne Lei transportation for appointments when/if needed. We also discussed options for transportation on short notice/acute visits.  Palliative care  services: We have palliative care services available in the cancer center to discuss goals of care and advanced care planning.  Please let us know if you have any questions or would like to speak to our palliative nurse practitioner.  Symptom Management Clinic: We discussed our symptom management clinic which is available for acute concerns while receiving treatment such as nausea, vomiting or diarrhea.  We can be reached via telephone at [phone] or through my chart.  We are available for virtual or in person visits on the same day from 830 to 4 PM Monday through Friday. She denies needing specific assistance at this time and She will be followed by Dr. Mike Gip clinical team.  Plan: Discussed symptom management clinic. Discussed palliative care services. Discussed resources that are available here at the cancer center. Discussed medications and new prescriptions to begin treatment such as anti-nausea or steroids.   Disposition: RTC on 11/28/2019 for lab work MD assessment. RTC on 11/21/2019 for cycle 1 daratumumab.   Visit Diagnosis 1. Anemia, unspecified type   2. Multiple myeloma not having achieved remission [facility])     Patient expressed understanding and was in agreement with this plan. She also understands that She can call clinic at any time with any questions, concerns, or complaints.   Greater than 50% was spent in counseling and coordination of care with this patient including but not limited to discussion of the relevant topics above (See A&P) including, but not limited to diagnosis and management of acute and chronic medical conditions.   [place] [facility] [place]  CC: Dr. Mike Gip

## 2019-11-27 NOTE — Progress Notes (Signed)
.   Pharmacist Chemotherapy Monitoring - Initial Assessment    Anticipated start date: 11/28/19   Regimen:  . Are orders appropriate based on the patient's diagnosis, regimen, and cycle? Yes . Does the plan date match the patient's scheduled date? Yes . Is the sequencing of drugs appropriate? Yes . Are the premedications appropriate for the patient's regimen? Yes . Prior Authorization for treatment is: Approved o If applicable, is the correct biosimilar selected based on the patient's insurance? not applicable  Organ Function and Labs: Marland Kitchen Are dose adjustments needed based on the patient's renal function, hepatic function, or hematologic function? No . Are appropriate labs ordered prior to the start of patient's treatment? No . Other organ system assessment, if indicated: N/A . The following baseline labs, if indicated, have been ordered: daratumumab: blood type and screen  Dose Assessment: . Are the drug doses appropriate? Yes . Are the following correct: o Drug concentrations Yes o IV fluid compatible with drug Yes o Administration routes Yes o Timing of therapy Yes . If applicable, does the patient have documented access for treatment and/or plans for port-a-cath placement? not applicable . If applicable, have lifetime cumulative doses been properly documented and assessed? not applicable Lifetime Dose Tracking  No doses have been documented on this patient for the following tracked chemicals: Doxorubicin, Epirubicin, Idarubicin, Daunorubicin, Mitoxantrone, Bleomycin, Oxaliplatin, Carboplatin, Liposomal Doxorubicin  o   Toxicity Monitoring/Prevention: . The patient has the following take home antiemetics prescribed: Ondansetron . The patient has the following take home medications prescribed: N/A . Medication allergies and previous infusion related reactions, if applicable, have been reviewed and addressed. No . The patient's current medication list has been assessed for drug-drug  interactions with their chemotherapy regimen. no significant drug-drug interactions were identified on review.  Order Review: . Are the treatment plan orders signed? Yes . Is the patient scheduled to see a provider prior to their treatment? No  I verify that I have reviewed each item in the above checklist and answered each question accordingly.  Wynona Neat 11/27/2019 4:17 PM

## 2019-11-27 NOTE — Progress Notes (Signed)
[facility]  [address], [address] [place], [place] [address] Phone: [phone]  Fax: [phone]   Clinic Day:  11/28/2019  Referring physician: Denton Lank, MD  Chief Complaint: Laurie [name] is a 74 y.o. female with multiple myeloma who is seen for assessment prior to cycle #1 daratumumab SQ and Decadron.  HPI: The patient was last seen in the hematology clinic on 11/23/2019. At that time, she denied any concerns.  Hematocrit was 27.4, hemoglobin 8.8, MCV 112.3, platelets 262,000, WBC 2,900. Creatinine was 1.08. CrCl was 51 ml/min. Ferritin was 70 with an iron saturation of 34% and a TIBC of 210.  We discussed planned treatment and the addition of Revlimid with future cycles based based on count recovery.  We discussed the addition of Retacrit and IV iron to promote red cell recovery.  During the interim, she has been "ok".  Her fatigue and shortness of breath are stable. She has rare palpitations. Her constipation and heartburn have improved. She has occasional headaches. She notes that her legs have felt tired towards the end of the day. The patient attended chemotherapy class and found it very helpful. She received her prescriptions for Zofran, Decadron, and Acyclovir.   The patient is ready to begin treatment. Her mood has improved and she states that she is "a happy sick person."  She takes a Singulair everyday for her asthma.   Past Medical History:  Diagnosis Date  . Anxiety   . Asthma   . Depression   . Head injury   . Osteoporosis     Past Surgical History:  Procedure Laterality Date  . ABDOMINAL HYSTERECTOMY    . HEMORROIDECTOMY    . SEPTOPLASTY      Family History  Problem Relation Age of Onset  . Breast cancer Cousin        mat cousin    Social History:  reports that she has never smoked. She has never used smokeless tobacco. She reports that she does not drink alcohol and does not use drugs.  She denies any exposure to  radiation or toxins. She denies alcohol and tobacco use. Her husband has schizophrenia and committed suicide. The patient is retired and has not worked since 84. 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  She then worked in the [employer] x 5 years until disability at age 5.  She lives in [place], [place].  She is a Sales promotion account executive [employer], and will not accept blood products.  The patient is alone today.   Allergies:  Allergies  Allergen Reactions  . Levofloxacin     Critical   . Other Other (See Comments)    Antihistamines    Current Medications: Current Outpatient Medications  Medication Sig Dispense Refill  . albuterol (VENTOLIN HFA) 108 (90 Base) MCG/ACT inhaler 2 inhalations every 4 (four) hours as needed.    Marland Kitchen alendronate (FOSAMAX) 70 MG tablet     . atorvastatin (LIPITOR) 20 MG tablet Take by mouth.    . docusate sodium (COLACE) 100 MG capsule Take 100 mg by mouth daily.    . fluticasone-salmeterol (ADVAIR HFA) 230-21 MCG/ACT inhaler Inhale into the lungs.    . montelukast (SINGULAIR) 10 MG tablet Take 10 mg by mouth at bedtime.    . naproxen (NAPROSYN) 500 MG tablet Take 500 mg by mouth 2 (two) times daily with a meal.    . nortriptyline (PAMELOR) 25 MG capsule Take by  mouth.    . sertraline (ZOLOFT) 100 MG tablet Take 200 mg by mouth daily.     . traZODone (DESYREL) 100 MG tablet Take 25 mg by mouth at bedtime.     . Vitamin D, Ergocalciferol, (DRISDOL) 50000 UNITS CAPS capsule Take 50,000 Units by mouth every 7 (seven) days.    Marland Kitchen acyclovir (ZOVIRAX) 400 MG tablet Take 1 tablet (400 mg total) by mouth 2 (two) times daily. (Patient not taking: Reported on 11/28/2019) 60 tablet 11  . dexamethasone (DECADRON) 4 MG tablet Take 5 tablets (20 mg total) by mouth once a week. Take 5 tablets (71m) weekly the day after daratumumab for 8 weeks. Take with breakfast. (Patient not taking: Reported on 11/28/2019) 40 tablet 0  . lenalidomide  (REVLIMID) 10 MG capsule Take 1 capsule (10 mg total) by mouth daily for 21 days. Take for 3 weeks; and one week off. (Patient not taking: Reported on 11/28/2019) 21 capsule 0  . ondansetron (ZOFRAN) 8 MG tablet Take 1 tablet (8 mg total) by mouth every 8 (eight) hours as needed (Nausea or vomiting). (Patient not taking: Reported on 11/28/2019) 30 tablet 1   No current facility-administered medications for this visit.    Review of Systems  Constitutional: Positive for malaise/fatigue. Negative for chills, diaphoresis, fever and weight loss (3 lbs).  HENT: Negative for congestion, ear discharge, ear pain, hearing loss, nosebleeds, sinus pain, sore throat and tinnitus.   Eyes: Negative.  Negative for blurred vision.  Respiratory: Positive for shortness of breath (from asthma). Negative for cough, hemoptysis and sputum production.        Asthma.  Cardiovascular: Positive for palpitations (rare). Negative for chest pain and leg swelling.  Gastrointestinal: Negative for abdominal pain, blood in stool, constipation (on stool softener, improved), diarrhea, heartburn (on medication, improved), melena, nausea and vomiting.  Genitourinary: Negative.  Negative for dysuria, frequency, hematuria and urgency.  Musculoskeletal: Negative.  Negative for back pain, falls, joint pain, myalgias and neck pain.       Legs feel "tired" in the evenings  Skin: Negative.  Negative for itching and rash.  Neurological: Positive for headaches (occasional). Negative for dizziness, tingling, sensory change and weakness.       Occasional balance problems. Traumatic brain injury (fall) 3 years ago.  Endo/Heme/Allergies: Negative.  Does not bruise/bleed easily.  Psychiatric/Behavioral: Negative for depression and memory loss. The patient is not nervous/anxious and does not have insomnia.   All other systems reviewed and are negative.  Performance status (ECOG): 1  Vitals Blood pressure (!) 132/82, pulse (!) 112, temperature  97.6 F (36.4 C), temperature source Tympanic, resp. rate 18, height _0  (1.651 m), weight (!) 205 lb 11 oz (93.3 kg), SpO2 97 %.   Physical Exam Vitals and nursing note reviewed.  Constitutional:      General: She is not in acute distress.    Appearance: Normal appearance. She is not diaphoretic.  HENT:     Head: Normocephalic and atraumatic.     Comments: Shoulder length gray hair.  Mask.    Ears:     Comments: Glasses.  Blue eyes.    Mouth/Throat:     Mouth: Mucous membranes are moist.     Pharynx: Oropharynx is clear.  Eyes:     General: No scleral icterus.    Extraocular Movements: Extraocular movements intact.     Conjunctiva/sclera: Conjunctivae normal.     Pupils: Pupils are equal, round, and reactive to light.  Cardiovascular:  Rate and Rhythm: Normal rate and regular rhythm.     Heart sounds: Normal heart sounds. No murmur heard.   Pulmonary:     Effort: Pulmonary effort is normal. No respiratory distress.     Breath sounds: Normal breath sounds. No wheezing or rales.  Chest:     Chest wall: No tenderness.  Abdominal:     General: Bowel sounds are normal. There is no distension.     Palpations: Abdomen is soft. There is no mass.     Tenderness: There is no abdominal tenderness. There is no guarding or rebound.  Musculoskeletal:        General: No swelling or tenderness. Normal range of motion.     Cervical back: Normal range of motion and neck supple.  Lymphadenopathy:     Head:     Right side of head: No preauricular, posterior auricular or occipital adenopathy.     Left side of head: No preauricular, posterior auricular or occipital adenopathy.     Cervical: No cervical adenopathy.     Upper Body:     Right upper body: No supraclavicular adenopathy.     Left upper body: No supraclavicular adenopathy.     Lower Body: No right inguinal adenopathy. No left inguinal adenopathy.  Skin:    General: Skin is warm and dry.  Neurological:     Mental Status: She  is alert and oriented to person, place, and time. Mental status is at baseline.  Psychiatric:        Mood and Affect: Mood and affect normal.        Behavior: Behavior normal.        Thought Content: Thought content normal.        Judgment: Judgment normal.    No visits with results within 3 Day(s) from this visit.  Latest known visit with results is:  Hospital Outpatient Visit on 11/13/2019  Component Date Value Ref Range Status  . WBC 11/13/2019 3.0* 4.0 - 10.5 K/uL Final  . RBC 11/13/2019 2.40* 3.87 - 5.11 MIL/uL Final  . Hemoglobin 11/13/2019 8.7* 12.0 - 15.0 g/dL Final  . HCT 11/13/2019 26.3* 36 - 46 % Final  . MCV 11/13/2019 109.6* 80.0 - 100.0 fL Final  . MCH 11/13/2019 36.3* 26.0 - 34.0 pg Final  . MCHC 11/13/2019 33.1  30.0 - 36.0 g/dL Final  . RDW 11/13/2019 14.0  11.5 - 15.5 % Final  . Platelets 11/13/2019 239  150 - 400 K/uL Final  . nRBC 11/13/2019 0.0  0.0 - 0.2 % Final  . Neutrophils Relative % 11/13/2019 66  % Final  . Neutro Abs 11/13/2019 2.0  1.7 - 7.7 K/uL Final  . Lymphocytes Relative 11/13/2019 23  % Final  . Lymphs Abs 11/13/2019 0.7  0.7 - 4.0 K/uL Final  . Monocytes Relative 11/13/2019 7  % Final  . Monocytes Absolute 11/13/2019 0.2  0 - 1 K/uL Final  . Eosinophils Relative 11/13/2019 4  % Final  . Eosinophils Absolute 11/13/2019 0.1  0 - 0 K/uL Final  . Basophils Relative 11/13/2019 0  % Final  . Basophils Absolute 11/13/2019 0.0  0 - 0 K/uL Final  . Immature Granulocytes 11/13/2019 0  % Final  . Abs Immature Granulocytes 11/13/2019 0.01  0.00 - 0.07 K/uL Final   Performed at [facility], [address] [place], [place] [address]  . Prothrombin Time 11/13/2019 16.0* 11.4 - 15.2 seconds Final  . INR 11/13/2019 1.3* 0.8 - 1.2 Final  Comment: (NOTE) INR goal varies based on device and disease states. Performed at [facility], [address], [place], [place] [address]   . SURGICAL PATHOLOGY 11/13/2019    Final-Edited                    Value:SURGICAL PATHOLOGY CASE: [id number] PATIENT: Gionni Stettner Bone Marrow Report  Clinical History: Monoclonal gammopathy, right ilium, (ADC)  DIAGNOSIS:  BONE MARROW, ASPIRATE, CLOT, CORE: -Hypercellular bone marrow with extensive involvement by plasma cell neoplasm -See comment  PERIPHERAL BLOOD: -Macrocytic anemia -Leukopenia  COMMENT:  The bone marrow shows extensive infiltration by atypical plasma cells representing 70% of all cells in the aspirate associated with prominent interstitial infiltrates and diffuse sheets in the clot and biopsy sections.  The plasma cells display kappa light chain restriction consistent with plasma cell neoplasm.  Correlation with cytogenetic and FISH studies is recommended.  MICROSCOPIC DESCRIPTION:  PERIPHERAL BLOOD SMEAR: The red blood cells display mild anisopoikilocytosis with mild polychromasia.  Prominent rouleaux formation is seen.  The white blood cells are decreased in number with no significant mor                         phologic abnormalities.  The platelets are normal in number.  BONE MARROW ASPIRATE: Bone marrow particles present Erythroid precursors: Progressive maturation with scattered maturing precursors displaying nuclear cytoplasmic dyssynchrony Granulocytic precursors: Orderly and progressive maturation Megakaryocytes: Present with predominantly normal morphology Lymphocytes/plasma cells: The plasma cells are markedly increased in number representing 70% of all cells associated with atypical cytomorphologic features characterized by cytomegaly and/or small nucleoli significant lymphoid aggregates are not present.  TOUCH PREPARATIONS: Mixture of cell types with prominent increase in atypical plasma cells  CLOT AND BIOPSY: The sections show 70 to 90% cellularity with interstitial infiltrates and diffuse sheets of atypical plasma cells characterized by partially clumped to vesicular chromatin and  prominent nucleoli.  Residual trilineage hematopoiesis is variably present in the                          background. Immunohistochemical stain for CD138 and in situ hybridization for kappa and lambda were performed on blocks B1 and C1 with appropriate controls.  CD138 highlights the extensive plasma cell component in the bone marrow which shows kappa light chain restriction.   IRON STAIN: Iron stains are performed on a bone marrow aspirate or touch imprint smear and section of clot. The controls stained appropriately.       Storage Iron: Decreased      Ring Sideroblasts: Absent  ADDITIONAL DATA/TESTING: The specimen was sent for cytogenetic analysis and FISH for multiple myeloma and a separate report will follow  CELL COUNT DATA:  Bone Marrow count performed on 500 cells shows: Blasts:   0%   Myeloid:  17% Promyelocytes: 0%   Erythroid:     11% Myelocytes:    1%   Lymphocytes:   2% Metamyelocytes:     1%   Plasma cells:  70% Bands:    3% Neutrophils:   11%  M:E ratio:     1.5 Eosinophils:   1% Basophils:     0% Monocytes:     0%  Lab Data: CBC performed on                          11/13/19 shows: WBC: 3.0 k/uL  Neutrophils:   67%  Hgb: 8.7 g/dL  Lymphocytes:   21% HCT: 26.3 %    Monocytes:     6% MCV: 109.6 fL  Eosinophils:   5% RDW: 14 % Basophils:     1% PLT: 239 k/uL  GROSS DESCRIPTION:  A: Aspirate smear  B: Received in B-plus fixative are tissue fragments measuring 0.4 x 0.3 x 0.1 cm in aggregate.  The specimen is submitted in toto.  C: Received in B-plus fixative is a 1.4 x 0.2 cm core of bone which is submitted in toto following decalcification.  [facility] 11/13/2019)  Final Diagnosis performed by Susanne Greenhouse, MD.   Electronically signed 11/15/2019 Technical and / or Professional components performed at [facility], [place] [address], [place], [place] [address].  Immunohistochemistry Technical component (if applicable) was performed at  [facility]. [address] [place], [place], [place] [address].   IMMUNOHISTOCHEMISTRY DISCLAIMER (if applicable): Some of these immunohistochemical stains may hav                         e been developed and the performance characteristics determine by [facility]. Some may not have been cleared or approved by the U.S. Food and Drug Administration. The FDA has determined that such clearance or approval is not necessary. This test is used for clinical purposes. It should not be regarded as investigational or for research. This laboratory is certified under the [place] (CLIA-88) as qualified to perform high complexity clinical laboratory testing.  The controls stained appropriately.    Assessment:  MARESHA ANASTOS is a 74 y.o. female with stage III biclonal IgA multiple myeloma.  SPEP was 5.0 gm/dL on 10/03/2019. Random urine revealed 196.6 mg/dL total protein with 58.3% M-spike.    Bone marrow biopsy on 11/13/2019 revealed a hypercellular bone marrow with extensive involvement by plasma cell neoplasm. Atypical plasma cells represented 70% of all cells in the aspirate and was associated with prominent interstitial infiltrates and diffuse sheets in the clot.  Plasma cells were kappa light chain restricted.   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     Labs on 10/03/2019 revealed a hematocrit 27.9, hemoglobin 9.4, MCV 108, platelets 287,000, WBC 3,800 (ANC 2400).   Creatinine was 0.94, albumin 3.4, and protein 11.2.  Ferritin was 82 with an iron saturation of 30% and a TIBC of 206.  Hepatitis C antibody and HIV testing were negative.    Work-up on  10/26/2019 revealed a hematocrit was 27.4, hemoglobin 9.1, MCV 109.6, platelets 250,000, WBC 3,400 (ANC 2,100).  Retic was 2.2%.  Creatinine was 1.06 and calcium 8.6. Total protein was 11.4 and albumin 3.2.  M-spike was 5.7 gm/dL biclonal IgA protein with kappa specificity.  Kappa free light chain were 1,376.3, lambda free light chains <1.5, and ratio > 917.53 (0.26-1.65).  Beta 2 microglobulin was 10.3 (0.6 - 2.4). LDH was 102. TSH was 3.673.   24 hour UPEP on 10/30/2019 revealed 1020 mg/24 hours protein with 65.3% M-spike (665.9 mg/24 hours).  Urine free kappa light chain were 2,620.44, free lambda light chain 6.01 and ratio 436.01 (1.03-31.76).  Bone survey on 10/26/2019 revealed small lytic lesions in the skull. There was no acute fracture.  She has B12 deficiency. Vitamin B12 was 200 (low) and folate >20.0 on 10/03/2019.  She was on weekly B12 injections x 3 and is now on monthly B12 injections.  She is a Restaurant manager, fast food.  She received both doses of the COVID-19 vaccine in 08/2019.  Symptomatically, she feels "ok".  She is ready to begin chemotherapy.  Exam is stable.  Plan: 1.   Review labs from 11/27/2019. 2.   Labs today: hepatitis B core antibody total, hepatitis C antibody, RBC phenotype. 3.   Stage III multiple myeloma             Patient has a 5.7 gm/dL biclonal IgA gammopathy with kappa specificity.             Kappa free light chains were 1,376.3 with a ratio > 917.53 (0.26-1.65).               Beta2 microglobulin was 10.3 (0.6 - 2.4).              Bone survey reveals lytic lesions in skull.             Bone marrow biopsy revealed 70% plasma cells.                         Cytogenetics are complex.             Re-review plan for [address] (cycle length 28 days).                         Hold Revlimid with first cycle.                         Daratumumab SQ 1800 mg on days 1, 8, 15, and 22 for cycles #1 and #2.                         Revlimid 10 mg po day 1-21 cycle #2.                          Decadron 20 mg po days 1-2, 8-9, 15-16, and 22-23.                         Cycles #3-6 and 7 and beyond as previously stated.             Prophylaxis:                         Singulair 10 mg po the day before, the day of and 2 days after daratumumab.                         Acyclovir 400 mg po BID.                         Aspirin 81 mg a day when Revlimid initiated.             Supportive care:                         Xgeva or Zometa.   Patient consented to treatment.       4.   Anemia  Hematocrit 28.5.  Hemoglobin 9.2.  MCV 111.3.             Etiology is Secondary to myeloma.             Patient unable to receive PRBCs.             Patient will receive aggressive supportive care with Venofer and Retacrit.                         Ferritin goal 100-400.                         Hemoglobin goal 11.             Retracrit today. 5.   B12 deficiency             B12 was 200 (low) on 10/03/2019.             Die tis modest.             She receives monthly B12 injections.             Check intrinsic factor and anti-parietal cell antibodies at next lab draw for pernicious anemia.            Discuss plan for monthly B12 in clinic- patient in agreement. 6.   RTC in [place] tomorrow for daratumumab (please provide patient with time). 7.   RTC 12/05/2019 for labs (CBC with diff, CMP), and Venofer. 8.   RTC 12/06/2019 in [place] for daratumumab. 9.   Cancel appts for Wednesday and Thursday this week.  I discussed the assessment and treatment plan with the patient.  The patient was provided an opportunity to ask questions and all were answered.  The patient agreed with the plan and demonstrated an understanding of the instructions.  The patient was advised to call back if the symptoms worsen or if the condition fails to improve as anticipated.  I provided 25 minutes of face-to-face time during this this encounter and > 50% was spent counseling as documented under my  assessment and plan.   Laurie [name] C. Mike Gip, MD, PhD    11/28/2019, 9:02 AM  I, Mirian Mo Tufford, am acting as a Education administrator for [employer]. Mike Gip, MD.   I, Harold Mattes C. Mike Gip, MD, have reviewed the above documentation for accuracy and completeness, and I agree with the above.

## 2019-11-28 ENCOUNTER — Other Ambulatory Visit: Payer: Self-pay

## 2019-11-28 ENCOUNTER — Other Ambulatory Visit: Payer: Self-pay | Admitting: Hematology and Oncology

## 2019-11-28 ENCOUNTER — Ambulatory Visit: Payer: Medicare HMO

## 2019-11-28 ENCOUNTER — Inpatient Hospital Stay: Payer: Medicare HMO

## 2019-11-28 ENCOUNTER — Inpatient Hospital Stay (HOSPITAL_BASED_OUTPATIENT_CLINIC_OR_DEPARTMENT_OTHER): Payer: Medicare HMO

## 2019-11-28 ENCOUNTER — Other Ambulatory Visit: Payer: Medicare HMO

## 2019-11-28 ENCOUNTER — Inpatient Hospital Stay (HOSPITAL_BASED_OUTPATIENT_CLINIC_OR_DEPARTMENT_OTHER): Payer: Medicare HMO | Admitting: Hematology and Oncology

## 2019-11-28 ENCOUNTER — Encounter: Payer: Self-pay | Admitting: Hematology and Oncology

## 2019-11-28 VITALS — BP 132/82 | HR 112 | Temp 97.6°F | Resp 18 | Ht 65.0 in | Wt 205.7 lb

## 2019-11-28 VITALS — HR 107

## 2019-11-28 DIAGNOSIS — C9000 Multiple myeloma not having achieved remission: Secondary | ICD-10-CM

## 2019-11-28 DIAGNOSIS — Z7189 Other specified counseling: Secondary | ICD-10-CM

## 2019-11-28 DIAGNOSIS — D649 Anemia, unspecified: Secondary | ICD-10-CM

## 2019-11-28 DIAGNOSIS — E538 Deficiency of other specified B group vitamins: Secondary | ICD-10-CM | POA: Diagnosis not present

## 2019-11-28 DIAGNOSIS — Z5112 Encounter for antineoplastic immunotherapy: Secondary | ICD-10-CM | POA: Diagnosis not present

## 2019-11-28 DIAGNOSIS — D539 Nutritional anemia, unspecified: Secondary | ICD-10-CM | POA: Diagnosis not present

## 2019-11-28 LAB — PRETREATMENT RBC PHENOTYPE: DAT, IgG: NEGATIVE

## 2019-11-28 LAB — HEPATITIS C ANTIBODY: HCV Ab: NONREACTIVE

## 2019-11-28 LAB — HEPATITIS B SURFACE ANTIGEN: Hepatitis B Surface Ag: NONREACTIVE

## 2019-11-28 LAB — HEPATITIS B CORE ANTIBODY, TOTAL: Hep B Core Total Ab: NONREACTIVE

## 2019-11-28 MED ORDER — EPOETIN ALFA-EPBX 10000 UNIT/ML IJ SOLN
10000.0000 [IU] | Freq: Once | INTRAMUSCULAR | Status: AC
  Administered 2019-11-28: 10000 [IU] via SUBCUTANEOUS
  Filled 2019-11-28: qty 1

## 2019-11-28 NOTE — Progress Notes (Signed)
No new changes noted today 

## 2019-11-28 NOTE — Patient Instructions (Signed)
Take your Singulair every day.   Epoetin Alfa injection What is this medicine? EPOETIN ALFA (e POE e tin AL fa) helps your body make more red blood cells. This medicine is used to treat anemia caused by chronic kidney disease, cancer chemotherapy, or HIV-therapy. It may also be used before surgery if you have anemia. This medicine may be used for other purposes; ask your health care provider or pharmacist if you have questions. COMMON BRAND NAME(S): Epogen, Procrit, Retacrit What should I tell my health care provider before I take this medicine? They need to know if you have any of these conditions:  cancer  heart disease  high blood pressure  history of blood clots  history of stroke  low levels of folate, iron, or vitamin B12 in the blood  seizures  an unusual or allergic reaction to erythropoietin, albumin, benzyl alcohol, hamster proteins, other medicines, foods, dyes, or preservatives  pregnant or trying to get pregnant  breast-feeding How should I use this medicine? This medicine is for injection into a vein or under the skin. It is usually given by a health care professional in a hospital or clinic setting. If you get this medicine at home, you will be taught how to prepare and give this medicine. Use exactly as directed. Take your medicine at regular intervals. Do not take your medicine more often than directed. It is important that you put your used needles and syringes in a special sharps container. Do not put them in a trash can. If you do not have a sharps container, call your pharmacist or healthcare provider to get one. A special MedGuide will be given to you by the pharmacist with each prescription and refill. Be sure to read this information carefully each time. Talk to your pediatrician regarding the use of this medicine in children. While this drug may be prescribed for selected conditions, precautions do apply. Overdosage: If you think you have taken too much of  this medicine contact a poison control center or emergency room at once. NOTE: This medicine is only for you. Do not share this medicine with others. What if I miss a dose? If you miss a dose, take it as soon as you can. If it is almost time for your next dose, take only that dose. Do not take double or extra doses. What may interact with this medicine? Interactions have not been studied. This list may not describe all possible interactions. Give your health care provider a list of all the medicines, herbs, non-prescription drugs, or dietary supplements you use. Also tell them if you smoke, drink alcohol, or use illegal drugs. Some items may interact with your medicine. What should I watch for while using this medicine? Your condition will be monitored carefully while you are receiving this medicine. You may need blood work done while you are taking this medicine. This medicine may cause a decrease in vitamin B6. You should make sure that you get enough vitamin B6 while you are taking this medicine. Discuss the foods you eat and the vitamins you take with your health care professional. What side effects may I notice from receiving this medicine? Side effects that you should report to your doctor or health care professional as soon as possible:  allergic reactions like skin rash, itching or hives, swelling of the face, lips, or tongue  seizures  signs and symptoms of a blood clot such as breathing problems; changes in vision; chest pain; severe, sudden headache; pain, swelling, warmth in  the leg; trouble speaking; sudden numbness or weakness of the face, arm or leg  signs and symptoms of a stroke like changes in vision; confusion; trouble speaking or understanding; severe headaches; sudden numbness or weakness of the face, arm or leg; trouble walking; dizziness; loss of balance or coordination Side effects that usually do not require medical attention (report to your doctor or health care  professional if they continue or are bothersome):  chills  cough  dizziness  fever  headaches  joint pain  muscle cramps  muscle pain  nausea, vomiting  pain, redness, or irritation at site where injected This list may not describe all possible side effects. Call your doctor for medical advice about side effects. You may report side effects to FDA at 1-800-FDA-1088. Where should I keep my medicine? Keep out of the reach of children. Store in a refrigerator between 2 and 8 degrees C (36 and 46 degrees F). Do not freeze or shake. Throw away any unused portion if using a single-dose vial. Multi-dose vials can be kept in the refrigerator for up to 21 days after the initial dose. Throw away unused medicine. NOTE: This sheet is a summary. It may not cover all possible information. If you have questions about this medicine, talk to your doctor, pharmacist, or health care provider.  2020 [employer]/Gold Standard (2016-11-27 08:35:19) Iron Sucrose injection What is this medicine? IRON SUCROSE (AHY ern SOO krohs) is an iron complex. Iron is used to make healthy red blood cells, which carry oxygen and nutrients throughout the body. This medicine is used to treat iron deficiency anemia in people with chronic kidney disease. This medicine may be used for other purposes; ask your health care provider or pharmacist if you have questions. COMMON BRAND NAME(S): Venofer What should I tell my health care provider before I take this medicine? They need to know if you have any of these conditions:  anemia not caused by low iron levels  heart disease  high levels of iron in the blood  kidney disease  liver disease  an unusual or allergic reaction to iron, other medicines, foods, dyes, or preservatives  pregnant or trying to get pregnant  breast-feeding How should I use this medicine? This medicine is for infusion into a vein. It is given by a health care professional in a hospital or  clinic setting. Talk to your pediatrician regarding the use of this medicine in children. While this drug may be prescribed for children as young as 2 years for selected conditions, precautions do apply. Overdosage: If you think you have taken too much of this medicine contact a poison control center or emergency room at once. NOTE: This medicine is only for you. Do not share this medicine with others. What if I miss a dose? It is important not to miss your dose. Call your doctor or health care professional if you are unable to keep an appointment. What may interact with this medicine? Do not take this medicine with any of the following medications:  deferoxamine  dimercaprol  other iron products This medicine may also interact with the following medications:  chloramphenicol  deferasirox This list may not describe all possible interactions. Give your health care provider a list of all the medicines, herbs, non-prescription drugs, or dietary supplements you use. Also tell them if you smoke, drink alcohol, or use illegal drugs. Some items may interact with your medicine. What should I watch for while using this medicine? Visit your doctor or healthcare professional regularly.  Tell your doctor or healthcare professional if your symptoms do not start to get better or if they get worse. You may need blood work done while you are taking this medicine. You may need to follow a special diet. Talk to your doctor. Foods that contain iron include: whole grains/cereals, dried fruits, beans, or peas, leafy green vegetables, and organ meats (liver, kidney). What side effects may I notice from receiving this medicine? Side effects that you should report to your doctor or health care professional as soon as possible:  allergic reactions like skin rash, itching or hives, swelling of the face, lips, or tongue  breathing problems  changes in blood pressure  cough  fast, irregular heartbeat  feeling  faint or lightheaded, falls  fever or chills  flushing, sweating, or hot feelings  joint or muscle aches/pains  seizures  swelling of the ankles or feet  unusually weak or tired Side effects that usually do not require medical attention (report to your doctor or health care professional if they continue or are bothersome):  diarrhea  feeling achy  headache  irritation at site where injected  nausea, vomiting  stomach upset  tiredness This list may not describe all possible side effects. Call your doctor for medical advice about side effects. You may report side effects to FDA at 1-800-FDA-1088. Where should I keep my medicine? This drug is given in a hospital or clinic and will not be stored at home. NOTE: This sheet is a summary. It may not cover all possible information. If you have questions about this medicine, talk to your doctor, pharmacist, or health care provider.  2020 [employer]vier/Gold Standard (2011-01-29 17:14:35)

## 2019-11-28 NOTE — Progress Notes (Signed)
Per Dr. Mike Gip patient can still receive retacrit today with a heart rate of 112.

## 2019-11-29 ENCOUNTER — Other Ambulatory Visit: Payer: Medicare HMO

## 2019-11-29 ENCOUNTER — Ambulatory Visit: Payer: Medicare HMO | Admitting: Hematology and Oncology

## 2019-11-29 ENCOUNTER — Inpatient Hospital Stay: Payer: Medicare HMO

## 2019-11-29 VITALS — BP 134/76 | HR 98 | Temp 97.5°F | Resp 18 | Wt 207.0 lb

## 2019-11-29 DIAGNOSIS — C9000 Multiple myeloma not having achieved remission: Secondary | ICD-10-CM

## 2019-11-29 DIAGNOSIS — Z5112 Encounter for antineoplastic immunotherapy: Secondary | ICD-10-CM | POA: Diagnosis not present

## 2019-11-29 DIAGNOSIS — D539 Nutritional anemia, unspecified: Secondary | ICD-10-CM

## 2019-11-29 LAB — ABO/RH: ABO/RH(D): B POS

## 2019-11-29 LAB — TYPE AND SCREEN
ABO/RH(D): B POS
Antibody Screen: NEGATIVE

## 2019-11-29 MED ORDER — SODIUM CHLORIDE 0.9 % IV SOLN
Freq: Once | INTRAVENOUS | Status: AC
  Filled 2019-11-29: qty 250

## 2019-11-29 MED ORDER — DARATUMUMAB-HYALURONIDASE-FIHJ 1800-30000 MG-UT/15ML ~~LOC~~ SOLN
1800.0000 mg | Freq: Once | SUBCUTANEOUS | Status: AC
  Administered 2019-11-29: 1800 mg via SUBCUTANEOUS
  Filled 2019-11-29: qty 15

## 2019-11-29 MED ORDER — DIPHENHYDRAMINE HCL 25 MG PO CAPS
50.0000 mg | ORAL_CAPSULE | Freq: Once | ORAL | Status: AC
  Administered 2019-11-29: 50 mg via ORAL
  Filled 2019-11-29: qty 2

## 2019-11-29 MED ORDER — SODIUM CHLORIDE 0.9 % IV SOLN
20.0000 mg | Freq: Once | INTRAVENOUS | Status: AC
  Administered 2019-11-29: 20 mg via INTRAVENOUS
  Filled 2019-11-29: qty 20

## 2019-11-29 MED ORDER — ACETAMINOPHEN 325 MG PO TABS
650.0000 mg | ORAL_TABLET | Freq: Once | ORAL | Status: AC
  Administered 2019-11-29: 650 mg via ORAL
  Filled 2019-11-29: qty 2

## 2019-11-29 MED ORDER — ONDANSETRON HCL 4 MG PO TABS
8.0000 mg | ORAL_TABLET | Freq: Once | ORAL | Status: AC
  Administered 2019-11-29: 8 mg via ORAL
  Filled 2019-11-29: qty 2

## 2019-11-29 NOTE — Progress Notes (Signed)
1335- Patient monitored and observed for 2 hours post Darzalex Faspro injection. Patient and vital signs stable at this time. Patient discharged to home.

## 2019-11-30 ENCOUNTER — Ambulatory Visit: Payer: Medicare HMO

## 2019-11-30 ENCOUNTER — Telehealth: Payer: Self-pay

## 2019-11-30 NOTE — Telephone Encounter (Signed)
Telephone call to patient for follow up after receiving first infusion.   Patient states infusion went great.  States eating good and drinking plenty of fluids.   Denies any nausea or vomiting.  Encouraged patient to call for any concerns or questions. 

## 2019-12-01 ENCOUNTER — Ambulatory Visit: Payer: Medicare HMO

## 2019-12-03 DIAGNOSIS — Z5112 Encounter for antineoplastic immunotherapy: Secondary | ICD-10-CM | POA: Insufficient documentation

## 2019-12-04 NOTE — Progress Notes (Signed)
[facility]  [address], [address] [place], [place] [address] Phone: [phone]  Fax: [phone]   Clinic Day:  12/05/2019  Referring physician: Denton Lank, MD  Chief Complaint: Laurie [name] is a 74 y.o. female with multiple myeloma who is seen for assessment prior to day 8 of cycle #1 daratumumab SQ and Decadron.  HPI: The patient was last seen in the hematology clinic on 11/28/2019. At that time, she felt "ok".  She was ready to begin chemotherapy.  Exam was stable. CBC revealed a hematocrit of 28.5, hemoglobin 9.2, platelets 257,000, WBC 3700 with an ANC of 2300.  Hepatitis serologies were non-reactive. She received Retacrit 10,000 units.  She received day 1 of cycle #1 daratumumab SQ and Decadron in [place] on 11/29/2019.  During the interim, she was been "ok".  She has a little bit of a headache today and has heartburn. She denies any problems with her treatment. She denies nausea, vomiting, diarrhea, and irritation at the injection site. She only needed to take one nausea medication.  She has been taking a Singulair everyday. She took Decadron the day after treatment. She has been taking acyclovir everyday. She did not have any side effects from Retacrit or Benadryl.  The patient would like to proceed with Delton See without seeing a dentist first. Her last Vitamin B12 injection was on 11/21/2019.   Past Medical History:  Diagnosis Date  . Anxiety   . Asthma   . Depression   . Head injury   . Osteoporosis     Past Surgical History:  Procedure Laterality Date  . ABDOMINAL HYSTERECTOMY    . HEMORROIDECTOMY    . SEPTOPLASTY      Family History  Problem Relation Age of Onset  . Breast cancer Cousin        mat cousin    Social History:  reports that she has never smoked. She has never used smokeless tobacco. She reports that she does not drink alcohol and does not use drugs.  She denies any exposure to radiation or toxins. She denies  alcohol and tobacco use. Her husband has schizophrenia and committed suicide. The patient is retired and has not worked since 66. 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  She then worked in the [employer] x 5 years until disability at age 3.  She lives in [place], [place].  She is a Sales promotion account executive [employer], and will not accept blood products.  The patient is alone today.   Allergies:  Allergies  Allergen Reactions  . Levofloxacin     Critical   . Other Other (See Comments)    Antihistamines    Current Medications: Current Outpatient Medications  Medication Sig Dispense Refill  . acyclovir (ZOVIRAX) 400 MG tablet Take 1 tablet (400 mg total) by mouth 2 (two) times daily. 60 tablet 11  . albuterol (VENTOLIN HFA) 108 (90 Base) MCG/ACT inhaler 2 inhalations every 4 (four) hours as needed.    Marland Kitchen alendronate (FOSAMAX) 70 MG tablet     . atorvastatin (LIPITOR) 20 MG tablet Take by mouth.    . dexamethasone (DECADRON) 4 MG tablet Take 5 tablets (20 mg total) by mouth once a week. Take 5 tablets ('20mg'$ ) weekly the day after daratumumab for 8 weeks. Take with breakfast. 40 tablet 0  . docusate sodium (COLACE) 100 MG capsule Take 100 mg by mouth daily.    . fluticasone-salmeterol (ADVAIR HFA) 230-21 MCG/ACT  inhaler Inhale into the lungs.    . montelukast (SINGULAIR) 10 MG tablet Take 10 mg by mouth at bedtime.    . naproxen (NAPROSYN) 500 MG tablet Take 500 mg by mouth 2 (two) times daily with a meal.    . nortriptyline (PAMELOR) 25 MG capsule Take by mouth.    . sertraline (ZOLOFT) 100 MG tablet Take 200 mg by mouth daily.     . traZODone (DESYREL) 100 MG tablet Take 25 mg by mouth at bedtime.     . Vitamin D, Ergocalciferol, (DRISDOL) 50000 UNITS CAPS capsule Take 50,000 Units by mouth every 7 (seven) days.    Marland Kitchen lenalidomide (REVLIMID) 10 MG capsule Take 1 capsule (10 mg total) by mouth daily for 21 days. Take for 3 weeks; and one week off.  (Patient not taking: Reported on 11/28/2019) 21 capsule 0  . ondansetron (ZOFRAN) 8 MG tablet Take 1 tablet (8 mg total) by mouth every 8 (eight) hours as needed (Nausea or vomiting). (Patient not taking: Reported on 11/28/2019) 30 tablet 1   No current facility-administered medications for this visit.    Review of Systems  Constitutional: Negative for chills, diaphoresis, fever, malaise/fatigue and weight loss (up 4 lbs).       Feels "ok".  HENT: Negative for congestion, ear discharge, ear pain, hearing loss, nosebleeds, sinus pain, sore throat and tinnitus.   Eyes: Negative.  Negative for blurred vision.  Respiratory: Negative for cough, hemoptysis, sputum production and shortness of breath.        Asthma.  Cardiovascular: Negative for chest pain, palpitations and leg swelling.  Gastrointestinal: Positive for heartburn (on medication). Negative for abdominal pain, blood in stool, constipation (on stool softener), diarrhea, melena, nausea and vomiting.  Genitourinary: Negative.  Negative for dysuria, frequency, hematuria and urgency.  Musculoskeletal: Negative.  Negative for back pain, falls, joint pain, myalgias and neck pain.  Skin: Negative.  Negative for itching and rash.  Neurological: Positive for headaches. Negative for dizziness, tingling, sensory change and weakness.       Occasional balance problems. Traumatic brain injury (fall) 3 years ago.  Endo/Heme/Allergies: Negative.  Does not bruise/bleed easily.  Psychiatric/Behavioral: Negative for depression and memory loss. The patient is not nervous/anxious and does not have insomnia.   All other systems reviewed and are negative.  Performance status (ECOG): 1  Vitals Blood pressure 120/62, pulse (!) 105, temperature 97.8 F (36.6 C), temperature source Tympanic, resp. rate 18, height '5\' 5"'$  (1.651 m), weight 209 lb 7 oz (95 kg), SpO2 100 %.   Physical Exam Vitals and nursing note reviewed.  Constitutional:      General: She is  not in acute distress.    Appearance: Normal appearance. She is not diaphoretic.  HENT:     Head: Normocephalic and atraumatic.     Comments: Shoulder length gray hair.  Mask.    Ears:     Comments: Glasses.  Blue eyes.    Mouth/Throat:     Mouth: Mucous membranes are moist.     Pharynx: Oropharynx is clear.  Eyes:     General: No scleral icterus.    Extraocular Movements: Extraocular movements intact.     Conjunctiva/sclera: Conjunctivae normal.     Pupils: Pupils are equal, round, and reactive to light.  Cardiovascular:     Rate and Rhythm: Normal rate and regular rhythm.     Heart sounds: Normal heart sounds. No murmur heard.   Pulmonary:     Effort: Pulmonary effort is normal.  No respiratory distress.     Breath sounds: Normal breath sounds. No wheezing or rales.  Chest:     Chest wall: No tenderness.  Abdominal:     General: Bowel sounds are normal. There is no distension.     Palpations: Abdomen is soft. There is no mass.     Tenderness: There is no abdominal tenderness. There is no guarding or rebound.  Musculoskeletal:        General: No swelling or tenderness. Normal range of motion.     Cervical back: Normal range of motion and neck supple.  Lymphadenopathy:     Head:     Right side of head: No preauricular, posterior auricular or occipital adenopathy.     Left side of head: No preauricular, posterior auricular or occipital adenopathy.     Cervical: No cervical adenopathy.     Upper Body:     Right upper body: No supraclavicular or axillary adenopathy.     Left upper body: No supraclavicular or axillary adenopathy.     Lower Body: No right inguinal adenopathy. No left inguinal adenopathy.  Skin:    General: Skin is warm and dry.  Neurological:     Mental Status: She is alert and oriented to person, place, and time. Mental status is at baseline.  Psychiatric:        Mood and Affect: Mood and affect normal.        Behavior: Behavior normal.        Thought  Content: Thought content normal.        Judgment: Judgment normal.    No visits with results within 3 Day(s) from this visit.  Latest known visit with results is:  Hospital Outpatient Visit on 11/13/2019  Component Date Value Ref Range Status  . WBC 11/13/2019 3.0* 4.0 - 10.5 K/uL Final  . RBC 11/13/2019 2.40* 3.87 - 5.11 MIL/uL Final  . Hemoglobin 11/13/2019 8.7* 12.0 - 15.0 g/dL Final  . HCT 11/13/2019 26.3* 36 - 46 % Final  . MCV 11/13/2019 109.6* 80.0 - 100.0 fL Final  . MCH 11/13/2019 36.3* 26.0 - 34.0 pg Final  . MCHC 11/13/2019 33.1  30.0 - 36.0 g/dL Final  . RDW 11/13/2019 14.0  11.5 - 15.5 % Final  . Platelets 11/13/2019 239  150 - 400 K/uL Final  . nRBC 11/13/2019 0.0  0.0 - 0.2 % Final  . Neutrophils Relative % 11/13/2019 66  % Final  . Neutro Abs 11/13/2019 2.0  1.7 - 7.7 K/uL Final  . Lymphocytes Relative 11/13/2019 23  % Final  . Lymphs Abs 11/13/2019 0.7  0.7 - 4.0 K/uL Final  . Monocytes Relative 11/13/2019 7  % Final  . Monocytes Absolute 11/13/2019 0.2  0 - 1 K/uL Final  . Eosinophils Relative 11/13/2019 4  % Final  . Eosinophils Absolute 11/13/2019 0.1  0 - 0 K/uL Final  . Basophils Relative 11/13/2019 0  % Final  . Basophils Absolute 11/13/2019 0.0  0 - 0 K/uL Final  . Immature Granulocytes 11/13/2019 0  % Final  . Abs Immature Granulocytes 11/13/2019 0.01  0.00 - 0.07 K/uL Final   Performed at [facility], [place] [place], [place] [address]  . Prothrombin Time 11/13/2019 16.0* 11.4 - 15.2 seconds Final  . INR 11/13/2019 1.3* 0.8 - 1.2 Final   Comment: (NOTE) INR goal varies based on device and disease states. Performed at [facility], [address], [place], [place] [address]   . SURGICAL  PATHOLOGY 11/13/2019    Final-Edited                   Value:SURGICAL PATHOLOGY CASE: [id number] PATIENT: Arma Mcphearson Bone Marrow Report  Clinical History: Monoclonal gammopathy, right ilium, (ADC)  DIAGNOSIS:  BONE MARROW,  ASPIRATE, CLOT, CORE: -Hypercellular bone marrow with extensive involvement by plasma cell neoplasm -See comment  PERIPHERAL BLOOD: -Macrocytic anemia -Leukopenia  COMMENT:  The bone marrow shows extensive infiltration by atypical plasma cells representing 70% of all cells in the aspirate associated with prominent interstitial infiltrates and diffuse sheets in the clot and biopsy sections.  The plasma cells display kappa light chain restriction consistent with plasma cell neoplasm.  Correlation with cytogenetic and FISH studies is recommended.  MICROSCOPIC DESCRIPTION:  PERIPHERAL BLOOD SMEAR: The red blood cells display mild anisopoikilocytosis with mild polychromasia.  Prominent rouleaux formation is seen.  The white blood cells are decreased in number with no significant mor                         phologic abnormalities.  The platelets are normal in number.  BONE MARROW ASPIRATE: Bone marrow particles present Erythroid precursors: Progressive maturation with scattered maturing precursors displaying nuclear cytoplasmic dyssynchrony Granulocytic precursors: Orderly and progressive maturation Megakaryocytes: Present with predominantly normal morphology Lymphocytes/plasma cells: The plasma cells are markedly increased in number representing 70% of all cells associated with atypical cytomorphologic features characterized by cytomegaly and/or small nucleoli significant lymphoid aggregates are not present.  TOUCH PREPARATIONS: Mixture of cell types with prominent increase in atypical plasma cells  CLOT AND BIOPSY: The sections show 70 to 90% cellularity with interstitial infiltrates and diffuse sheets of atypical plasma cells characterized by partially clumped to vesicular chromatin and prominent nucleoli.  Residual trilineage hematopoiesis is variably present in the                          background. Immunohistochemical stain for CD138 and in situ hybridization for kappa  and lambda were performed on blocks B1 and C1 with appropriate controls.  CD138 highlights the extensive plasma cell component in the bone marrow which shows kappa light chain restriction.   IRON STAIN: Iron stains are performed on a bone marrow aspirate or touch imprint smear and section of clot. The controls stained appropriately.       Storage Iron: Decreased      Ring Sideroblasts: Absent  ADDITIONAL DATA/TESTING: The specimen was sent for cytogenetic analysis and FISH for multiple myeloma and a separate report will follow  CELL COUNT DATA:  Bone Marrow count performed on 500 cells shows: Blasts:   0%   Myeloid:  17% Promyelocytes: 0%   Erythroid:     11% Myelocytes:    1%   Lymphocytes:   2% Metamyelocytes:     1%   Plasma cells:  70% Bands:    3% Neutrophils:   11%  M:E ratio:     1.5 Eosinophils:   1% Basophils:     0% Monocytes:     0%  Lab Data: CBC performed on                          11/13/19 shows: WBC: 3.0 k/uL  Neutrophils:   67% Hgb: 8.7 g/dL  Lymphocytes:   21% HCT: 26.3 %    Monocytes:     6% MCV: 109.6 fL  Eosinophils:  5% RDW: 14 % Basophils:     1% PLT: 239 k/uL  GROSS DESCRIPTION:  A: Aspirate smear  B: Received in B-plus fixative are tissue fragments measuring 0.4 x 0.3 x 0.1 cm in aggregate.  The specimen is submitted in toto.  C: Received in B-plus fixative is a 1.4 x 0.2 cm core of bone which is submitted in toto following decalcification.  [place] 11/13/2019)  Final Diagnosis performed by Susanne Greenhouse, MD.   Electronically signed 11/15/2019 Technical and / or Professional components performed at [facility], [place] [address] [place], [place] [address].  Immunohistochemistry Technical component (if applicable) was performed at [facility]. [address] [place], [place], [place] [address].   IMMUNOHISTOCHEMISTRY DISCLAIMER (if applicable): Some of these immunohistochemical stains may hav                          e been developed and the performance characteristics determine by [facility]. Some may not have been cleared or approved by the U.S. Food and Drug Administration. The FDA has determined that such clearance or approval is not necessary. This test is used for clinical purposes. It should not be regarded as investigational or for research. This laboratory is certified under the [place] (CLIA-88) as qualified to perform high complexity clinical laboratory testing.  The controls stained appropriately.    Assessment:  Laurie [name] is a 74 y.o. female with stage III biclonal IgA multiple myeloma.  SPEP was 5.0 gm/dL on 10/03/2019. Random urine revealed 196.6 mg/dL total protein with 58.3% M-spike.    Bone marrow biopsy on 11/13/2019 revealed a hypercellular bone marrow with extensive involvement by plasma cell neoplasm. Atypical plasma cells represented 70% of all cells in the aspirate and was associated with prominent interstitial infiltrates and diffuse sheets in the clot.  Plasma cells were kappa light chain restricted.   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     Labs on 10/03/2019 revealed a hematocrit 27.9, hemoglobin 9.4, MCV 108, platelets 287,000, WBC 3,800 (ANC 2400).   Creatinine was 0.94, albumin 3.4, and protein 11.2.  Ferritin was 82 with an iron saturation of 30% and a TIBC of 206.  Hepatitis C antibody and HIV testing were negative.    Work-up on 10/26/2019 revealed a hematocrit was 27.4, hemoglobin 9.1, MCV 109.6, platelets 250,000, WBC 3,400 (ANC 2,100).  Retic was 2.2%.  Creatinine was 1.06 and calcium 8.6. Total protein was 11.4 and albumin 3.2.   M-spike was 5.7 gm/dL biclonal IgA protein with kappa specificity.  Kappa free light chain were 1,376.3, lambda free light chains <1.5, and ratio > 917.53 (0.26-1.65).  Beta 2 microglobulin was 10.3 (0.6 - 2.4). LDH was 102. TSH was 3.673.  Hepatitis serologies were negative on 11/28/2019.  24 hour UPEP on 10/30/2019 revealed 1020 mg/24 hours protein with 65.3% M-spike (665.9 mg/24 hours).  Urine free kappa light chain were 2,620.44, free lambda light chain 6.01 and ratio 436.01 (1.03-31.76).  Bone survey on 10/26/2019 revealed small lytic lesions in the skull. There was no acute fracture.  She is day 7 of cycle #1 daratumumab SQ and Decadron (11/29/2019).  She has B12 deficiency. Vitamin B12 was  200 (low) and folate >20.0 on 10/03/2019.  She was on weekly B12 injections x 3 and is now on monthly B12 injections (last 11/21/2019).  She is a Scientist, product/process development.  She receives Retacrit (last 11/28/2019) and IV iron support.  Ferritin was 59 on 11/27/2019.  She received both doses of the COVID-19 vaccine in 08/2019.  Symptomatically, she feels "ok".  She is tolerating treatment well.  Exam is stable.  Plan: 1.   Labs today: CBC and CMP. 2.   Stage III multiple myeloma             Patient has a 5.7 gm/dL biclonal IgA gammopathy with kappa specificity.             Kappa free light chains were 1,376.3 with a ratio > 917.53 (0.26-1.65).               Beta2 microglobulin was 10.3 (0.6 - 2.4).              Bone survey reveals lytic lesions in skull.             Bone marrow biopsy revealed 70% plasma cells.                         Cytogenetics are complex.            Treatment plan: [address] (cycle length 28 days).                         Hold Revlimid with first cycle.                         Daratumumab SQ 1800 mg on days 1, 8, 15, and 22 for cycles #1 and #2.                         Revlimid 10 mg po day 1-21 cycle #2.                         Decadron 20 mg po days 1-2, 8-9, 15-16, and 22-23.                          Cycles #3-6 and 7 and beyond as previously stated.             Prophylaxis:                         Singulair 10 mg po the day before, the day of and 2 days after daratumumab.                         Acyclovir 400 mg po BID.                         Aspirin 81 mg a day when Revlimid initiated.             Supportive care:                         Patient consents to [facility] today.  Patient is day 7 of cycle #1 daratumumab (began 11/29/2019).     She is tolerating her treatment well.  Labs reviewed.  She will receive day 8 daratumumab in  [place].  Discuss plans for day 15 daratumumab in [place].  Discuss symptom management.  She has antiemetics at home to use on a prn bases.  Interventions are adequate.       4.   Anemia             Hematocrit 28.8.  Hemoglobin 9.4.  MCV 111.6.             Etiology is secondary to myeloma.             Patient unable to receive PRBCs.             Continue aggressive supportive care with Venofer and Retacrit.                         Ferritin goal 100-400.                         Hemoglobin goal 11.             She received Retacrit on 11/28/2019.  Discuss plans for Venofer today. 5.   B12 deficiency             B12 was 200 (low) on 10/03/2019.             Diet is modest.             She receives monthly B12 injections (last 11/21/2019.             Back intrinsic factor and antiparietal cell antibodies today to r/o pernicious anemia.  Continue monthly B12 in clinic. 6.   Venofer today. 7.   Xgeva today. 8.   RTC tomorrow in [place] for day 8 of cycle #1 daratumumab SQ. 9.   RTC on 12/13/2019 in [place] for MD assessment, labs (CBC with diff, CMP), and day 15 of cycle #1 daratumumab SQ and +/- Retacrit. 10.   RTC on 12/19/2019 for B12 then monthly x 5  I discussed the assessment and treatment plan with the patient.  The patient was provided an opportunity to ask questions and all were answered.  The patient agreed with the plan and  demonstrated an understanding of the instructions.  The patient was advised to call back if the symptoms worsen or if the condition fails to improve as anticipated.   Laksh Hinners C. Mike Gip, MD, PhD    12/05/2019, 8:59 AM  I, Mirian Mo Tufford, am acting as a Education administrator for [employer]. Mike Gip, MD.   I, Urian Martenson C. Mike Gip, MD, have reviewed the above documentation for accuracy and completeness, and I agree with the above.

## 2019-12-05 ENCOUNTER — Encounter: Payer: Self-pay | Admitting: Hematology and Oncology

## 2019-12-05 ENCOUNTER — Other Ambulatory Visit: Payer: Self-pay

## 2019-12-05 ENCOUNTER — Inpatient Hospital Stay: Payer: Medicare HMO

## 2019-12-05 ENCOUNTER — Inpatient Hospital Stay: Payer: Medicare HMO | Attending: Hematology and Oncology

## 2019-12-05 ENCOUNTER — Inpatient Hospital Stay: Payer: Medicare HMO | Admitting: Hematology and Oncology

## 2019-12-05 VITALS — BP 121/76 | HR 97 | Resp 18

## 2019-12-05 VITALS — BP 120/62 | HR 105 | Temp 97.8°F | Resp 18 | Ht 65.0 in | Wt 209.4 lb

## 2019-12-05 DIAGNOSIS — F419 Anxiety disorder, unspecified: Secondary | ICD-10-CM | POA: Diagnosis not present

## 2019-12-05 DIAGNOSIS — Z79899 Other long term (current) drug therapy: Secondary | ICD-10-CM | POA: Insufficient documentation

## 2019-12-05 DIAGNOSIS — Z803 Family history of malignant neoplasm of breast: Secondary | ICD-10-CM | POA: Insufficient documentation

## 2019-12-05 DIAGNOSIS — Z5112 Encounter for antineoplastic immunotherapy: Secondary | ICD-10-CM | POA: Insufficient documentation

## 2019-12-05 DIAGNOSIS — Z7952 Long term (current) use of systemic steroids: Secondary | ICD-10-CM | POA: Diagnosis not present

## 2019-12-05 DIAGNOSIS — C9000 Multiple myeloma not having achieved remission: Secondary | ICD-10-CM | POA: Diagnosis not present

## 2019-12-05 DIAGNOSIS — D539 Nutritional anemia, unspecified: Secondary | ICD-10-CM | POA: Diagnosis not present

## 2019-12-05 DIAGNOSIS — F329 Major depressive disorder, single episode, unspecified: Secondary | ICD-10-CM | POA: Insufficient documentation

## 2019-12-05 DIAGNOSIS — Z7951 Long term (current) use of inhaled steroids: Secondary | ICD-10-CM | POA: Insufficient documentation

## 2019-12-05 DIAGNOSIS — E538 Deficiency of other specified B group vitamins: Secondary | ICD-10-CM | POA: Insufficient documentation

## 2019-12-05 DIAGNOSIS — Z90710 Acquired absence of both cervix and uterus: Secondary | ICD-10-CM | POA: Insufficient documentation

## 2019-12-05 DIAGNOSIS — J45909 Unspecified asthma, uncomplicated: Secondary | ICD-10-CM | POA: Diagnosis not present

## 2019-12-05 DIAGNOSIS — M899 Disorder of bone, unspecified: Secondary | ICD-10-CM | POA: Diagnosis not present

## 2019-12-05 DIAGNOSIS — D649 Anemia, unspecified: Secondary | ICD-10-CM

## 2019-12-05 LAB — CBC WITH DIFFERENTIAL/PLATELET
Abs Immature Granulocytes: 0.01 10*3/uL (ref 0.00–0.07)
Basophils Absolute: 0 10*3/uL (ref 0.0–0.1)
Basophils Relative: 0 %
Eosinophils Absolute: 0.1 10*3/uL (ref 0.0–0.5)
Eosinophils Relative: 5 %
HCT: 28.8 % — ABNORMAL LOW (ref 36.0–46.0)
Hemoglobin: 9.4 g/dL — ABNORMAL LOW (ref 12.0–15.0)
Immature Granulocytes: 0 %
Lymphocytes Relative: 20 %
Lymphs Abs: 0.6 10*3/uL — ABNORMAL LOW (ref 0.7–4.0)
MCH: 36.4 pg — ABNORMAL HIGH (ref 26.0–34.0)
MCHC: 32.6 g/dL (ref 30.0–36.0)
MCV: 111.6 fL — ABNORMAL HIGH (ref 80.0–100.0)
Monocytes Absolute: 0.2 10*3/uL (ref 0.1–1.0)
Monocytes Relative: 6 %
Neutro Abs: 2.1 10*3/uL (ref 1.7–7.7)
Neutrophils Relative %: 69 %
Platelets: 212 10*3/uL (ref 150–400)
RBC: 2.58 MIL/uL — ABNORMAL LOW (ref 3.87–5.11)
RDW: 14.3 % (ref 11.5–15.5)
WBC: 3 10*3/uL — ABNORMAL LOW (ref 4.0–10.5)
nRBC: 0 % (ref 0.0–0.2)

## 2019-12-05 LAB — COMPREHENSIVE METABOLIC PANEL
ALT: 12 U/L (ref 0–44)
AST: 14 U/L — ABNORMAL LOW (ref 15–41)
Albumin: 3 g/dL — ABNORMAL LOW (ref 3.5–5.0)
Alkaline Phosphatase: 47 U/L (ref 38–126)
Anion gap: 9 (ref 5–15)
BUN: 20 mg/dL (ref 8–23)
CO2: 24 mmol/L (ref 22–32)
Calcium: 8.6 mg/dL — ABNORMAL LOW (ref 8.9–10.3)
Chloride: 103 mmol/L (ref 98–111)
Creatinine, Ser: 1.05 mg/dL — ABNORMAL HIGH (ref 0.44–1.00)
GFR calc Af Amer: >60 mL/min (ref >60)
GFR calc non Af Amer: 53 mL/min — ABNORMAL LOW (ref >60)
Glucose, Bld: 117 mg/dL — ABNORMAL HIGH (ref 70–99)
Potassium: 3.7 mmol/L (ref 3.5–5.1)
Sodium: 136 mmol/L (ref 135–145)
Total Bilirubin: 0.6 mg/dL (ref 0.3–1.2)
Total Protein: 10.7 g/dL — ABNORMAL HIGH (ref 6.5–8.1)

## 2019-12-05 MED ORDER — SODIUM CHLORIDE 0.9 % IV SOLN: 200.0000 mg | Freq: Once | INTRAVENOUS | Status: DC

## 2019-12-05 MED ORDER — DENOSUMAB 120 MG/1.7ML ~~LOC~~ SOLN
120.0000 mg | Freq: Once | SUBCUTANEOUS | Status: AC
  Administered 2019-12-05: 120 mg via SUBCUTANEOUS
  Filled 2019-12-05: qty 1.7

## 2019-12-05 MED ORDER — SODIUM CHLORIDE 0.9 % IV SOLN
Freq: Once | INTRAVENOUS | Status: AC
  Filled 2019-12-05: qty 250

## 2019-12-05 MED ORDER — IRON SUCROSE 20 MG/ML IV SOLN
200.0000 mg | Freq: Once | INTRAVENOUS | Status: AC
  Administered 2019-12-05: 200 mg via INTRAVENOUS
  Filled 2019-12-05: qty 10

## 2019-12-05 NOTE — Progress Notes (Signed)
Ca =8.6, Alb =3, corrected calcium =9.4

## 2019-12-05 NOTE — Progress Notes (Signed)
No new changes noted today 

## 2019-12-06 ENCOUNTER — Inpatient Hospital Stay: Payer: Medicare HMO

## 2019-12-06 VITALS — BP 131/71 | HR 96 | Temp 96.0°F | Resp 18

## 2019-12-06 DIAGNOSIS — C9000 Multiple myeloma not having achieved remission: Secondary | ICD-10-CM

## 2019-12-06 DIAGNOSIS — Z5112 Encounter for antineoplastic immunotherapy: Secondary | ICD-10-CM | POA: Diagnosis not present

## 2019-12-06 LAB — INTRINSIC FACTOR ANTIBODIES: Intrinsic Factor: 1 AU/mL (ref 0.0–1.1)

## 2019-12-06 LAB — ANTI-PARIETAL ANTIBODY: Parietal Cell Antibody-IgG: 5.7 Units (ref 0.0–20.0)

## 2019-12-06 MED ORDER — DIPHENHYDRAMINE HCL 25 MG PO CAPS
50.0000 mg | ORAL_CAPSULE | Freq: Once | ORAL | Status: AC
  Administered 2019-12-06: 50 mg via ORAL
  Filled 2019-12-06: qty 2

## 2019-12-06 MED ORDER — SODIUM CHLORIDE 0.9 % IV SOLN
Freq: Once | INTRAVENOUS | Status: AC
  Filled 2019-12-06: qty 250

## 2019-12-06 MED ORDER — ACETAMINOPHEN 325 MG PO TABS
650.0000 mg | ORAL_TABLET | Freq: Once | ORAL | Status: AC
  Administered 2019-12-06: 650 mg via ORAL
  Filled 2019-12-06: qty 2

## 2019-12-06 MED ORDER — SODIUM CHLORIDE 0.9 % IV SOLN
20.0000 mg | Freq: Once | INTRAVENOUS | Status: AC
  Administered 2019-12-06: 20 mg via INTRAVENOUS
  Filled 2019-12-06: qty 20

## 2019-12-06 MED ORDER — ONDANSETRON HCL 4 MG PO TABS
8.0000 mg | ORAL_TABLET | Freq: Once | ORAL | Status: AC
  Administered 2019-12-06: 8 mg via ORAL
  Filled 2019-12-06: qty 2

## 2019-12-06 MED ORDER — DARATUMUMAB-HYALURONIDASE-FIHJ 1800-30000 MG-UT/15ML ~~LOC~~ SOLN
1800.0000 mg | Freq: Once | SUBCUTANEOUS | Status: AC
  Administered 2019-12-06: 1800 mg via SUBCUTANEOUS
  Filled 2019-12-06: qty 15

## 2019-12-06 NOTE — Progress Notes (Signed)
Pt tolerated treatment well. No s/s of distress or reaction noted. Injection site WNL, no redness or swelling noted. Pt and VS stable at discharge.

## 2019-12-12 NOTE — Progress Notes (Signed)
[facility]  [address] [place], [place] [address] Phone: [phone]  Fax: [phone]   Clinic Day:  12/13/2019  Referring physician: Denton Lank, MD  Chief Complaint: Laurie [name] is a 74 y.o. female with multiple myeloma who is seen for assessment prior to day 15 of cycle #1 daratumumab SQ and Decadron.  HPI: The patient was last seen in the hematology clinic on 12/05/2019. At that time, she felt "ok".  She was tolerating treatment well.  She voiced no concerns.  Hematocrit was 28.8, hemoglobin 9.4, MCV 111.6, platelets 212,000, WBC 3,000 (ANC 2,100). Creatinine was 1.05 (CrCl 53 ml/min). Calcium was 8.6. Total protein 10.7. Albumin was 3.0.  Parietal cell antibody-IgG and intrinsic factor antibodies were negative.  She received Venofer and Xgeva.  She received daratumumab SQ in [place] on 12/06/2019.  During the interim, she has been "fine." Treatment last week went well.  She denies nausea or vomiting. She has had some increased shortness of breath when she is outside because of her asthma. She does not take her BP at home. She denies feeling stressed or anxious today.   The patient takes Decadron and Acyclovir as prescribed. She takes Singulair daily. She agrees to switch from IV to po Decadron as a premedication.   Past Medical History:  Diagnosis Date  . Anxiety   . Asthma   . Depression   . Head injury   . Osteoporosis     Past Surgical History:  Procedure Laterality Date  . ABDOMINAL HYSTERECTOMY    . HEMORROIDECTOMY    . SEPTOPLASTY      Family History  Problem Relation Age of Onset  . Breast cancer Cousin        mat cousin    Social History:  reports that she has never smoked. She has never used smokeless tobacco. She reports that she does not drink alcohol and does not use drugs.  She denies any exposure to radiation or toxins. She denies alcohol and tobacco use. Her husband has schizophrenia and committed suicide.  The patient is retired and has not worked since 78. 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  She then worked in the [employer] x 5 years until disability at age 27.  She lives in [place], [place]. She has a Neurosurgeon named Phoebe.  She is a Sales promotion account executive [employer], and will not accept blood products.  The patient is alone today.   Allergies:  Allergies  Allergen Reactions  . Levofloxacin     Critical   . Other Other (See Comments)    Antihistamines    Current Medications: Current Outpatient Medications  Medication Sig Dispense Refill  . acyclovir (ZOVIRAX) 400 MG tablet Take 1 tablet (400 mg total) by mouth 2 (two) times daily. 60 tablet 11  . albuterol (VENTOLIN HFA) 108 (90 Base) MCG/ACT inhaler 2 inhalations every 4 (four) hours as needed.    Marland Kitchen alendronate (FOSAMAX) 70 MG tablet     . atorvastatin (LIPITOR) 20 MG tablet Take by mouth.    . dexamethasone (DECADRON) 4 MG tablet Take 5 tablets (20 mg total) by mouth once a week. Take 5 tablets (57m) weekly the day after daratumumab for 8 weeks. Take with breakfast. 40 tablet 0  . docusate sodium (COLACE) 100 MG capsule Take 100 mg by mouth daily.    . fluticasone-salmeterol (ADVAIR HFA) 230-21 MCG/ACT inhaler Inhale into the lungs.    .Marland Kitchen  montelukast (SINGULAIR) 10 MG tablet Take 10 mg by mouth at bedtime.    . naproxen (NAPROSYN) 500 MG tablet Take 500 mg by mouth 2 (two) times daily with a meal.    . nortriptyline (PAMELOR) 25 MG capsule Take by mouth.    . sertraline (ZOLOFT) 100 MG tablet Take 200 mg by mouth daily.     . traZODone (DESYREL) 100 MG tablet Take 25 mg by mouth at bedtime.     . Vitamin D, Ergocalciferol, (DRISDOL) 50000 UNITS CAPS capsule Take 50,000 Units by mouth every 7 (seven) days.    Marland Kitchen lenalidomide (REVLIMID) 10 MG capsule Take 1 capsule (10 mg total) by mouth daily for 21 days. Take for 3 weeks; and one week off. (Patient not taking: Reported on 11/28/2019) 21 capsule 0    . ondansetron (ZOFRAN) 8 MG tablet Take 1 tablet (8 mg total) by mouth every 8 (eight) hours as needed (Nausea or vomiting). (Patient not taking: Reported on 11/28/2019) 30 tablet 1   No current facility-administered medications for this visit.   Review of Systems  Constitutional: Negative for chills, diaphoresis, fever, malaise/fatigue and weight loss (stable).       Feels "fine."  HENT: Negative for congestion, ear discharge, ear pain, hearing loss, nosebleeds, sinus pain, sore throat and tinnitus.   Eyes: Negative.  Negative for blurred vision.  Respiratory: Positive for shortness of breath (asthma/heat related). Negative for cough, hemoptysis and sputum production.        Asthma.  Cardiovascular: Negative for chest pain, palpitations and leg swelling.  Gastrointestinal: Negative for abdominal pain, blood in stool, constipation (on stool softener), diarrhea, heartburn (on medication), melena, nausea and vomiting.  Genitourinary: Negative.  Negative for dysuria, frequency, hematuria and urgency.  Musculoskeletal: Negative.  Negative for back pain, falls, joint pain, myalgias and neck pain.  Skin: Negative.  Negative for itching and rash.  Neurological: Negative for dizziness, tingling, sensory change, weakness and headaches.       Occasional balance problems. Traumatic brain injury (fall) 3 years ago.  Endo/Heme/Allergies: Negative.  Does not bruise/bleed easily.  Psychiatric/Behavioral: Negative for depression and memory loss. The patient is not nervous/anxious and does not have insomnia.   All other systems reviewed and are negative.  Performance status (ECOG): 0  Vitals Blood pressure (!) 144/92, pulse (!) 110, temperature (!) 96.7 F (35.9 C), temperature source Tympanic, resp. rate 18, weight 209 lb 7 oz (95 kg), SpO2 98 %.   Physical Exam Vitals and nursing note reviewed.  Constitutional:      General: She is not in acute distress.    Appearance: Normal appearance. She is not  diaphoretic.  HENT:     Head: Normocephalic and atraumatic.     Comments: Shoulder length gray hair.  Mask.    Ears:     Comments: Glasses.  Blue eyes.    Mouth/Throat:     Mouth: Mucous membranes are moist.     Pharynx: Oropharynx is clear.  Eyes:     General: No scleral icterus.    Extraocular Movements: Extraocular movements intact.     Conjunctiva/sclera: Conjunctivae normal.     Pupils: Pupils are equal, round, and reactive to light.  Cardiovascular:     Rate and Rhythm: Normal rate and regular rhythm.     Heart sounds: Normal heart sounds. No murmur heard.   Pulmonary:     Effort: Pulmonary effort is normal. No respiratory distress.     Breath sounds: Normal breath sounds. No wheezing  or rales.  Chest:     Chest wall: No tenderness.  Abdominal:     General: Bowel sounds are normal. There is no distension.     Palpations: Abdomen is soft. There is no mass.     Tenderness: There is no abdominal tenderness. There is no guarding or rebound.  Musculoskeletal:        General: No swelling or tenderness. Normal range of motion.     Cervical back: Normal range of motion and neck supple.  Lymphadenopathy:     Head:     Right side of head: No preauricular, posterior auricular or occipital adenopathy.     Left side of head: No preauricular, posterior auricular or occipital adenopathy.     Cervical: No cervical adenopathy.     Upper Body:     Right upper body: No supraclavicular adenopathy.     Left upper body: No supraclavicular adenopathy.     Lower Body: No right inguinal adenopathy. No left inguinal adenopathy.  Skin:    General: Skin is warm and dry.  Neurological:     Mental Status: She is alert and oriented to person, place, and time. Mental status is at baseline.  Psychiatric:        Mood and Affect: Affect normal.        Behavior: Behavior normal.        Thought Content: Thought content normal.        Judgment: Judgment normal.    No visits with results within 3  Day(s) from this visit.  Latest known visit with results is:  Hospital Outpatient Visit on 11/13/2019  Component Date Value Ref Range Status  . WBC 11/13/2019 3.0* 4.0 - 10.5 K/uL Final  . RBC 11/13/2019 2.40* 3.87 - 5.11 MIL/uL Final  . Hemoglobin 11/13/2019 8.7* 12.0 - 15.0 g/dL Final  . HCT 11/13/2019 26.3* 36 - 46 % Final  . MCV 11/13/2019 109.6* 80.0 - 100.0 fL Final  . MCH 11/13/2019 36.3* 26.0 - 34.0 pg Final  . MCHC 11/13/2019 33.1  30.0 - 36.0 g/dL Final  . RDW 11/13/2019 14.0  11.5 - 15.5 % Final  . Platelets 11/13/2019 239  150 - 400 K/uL Final  . nRBC 11/13/2019 0.0  0.0 - 0.2 % Final  . Neutrophils Relative % 11/13/2019 66  % Final  . Neutro Abs 11/13/2019 2.0  1.7 - 7.7 K/uL Final  . Lymphocytes Relative 11/13/2019 23  % Final  . Lymphs Abs 11/13/2019 0.7  0.7 - 4.0 K/uL Final  . Monocytes Relative 11/13/2019 7  % Final  . Monocytes Absolute 11/13/2019 0.2  0 - 1 K/uL Final  . Eosinophils Relative 11/13/2019 4  % Final  . Eosinophils Absolute 11/13/2019 0.1  0 - 0 K/uL Final  . Basophils Relative 11/13/2019 0  % Final  . Basophils Absolute 11/13/2019 0.0  0 - 0 K/uL Final  . Immature Granulocytes 11/13/2019 0  % Final  . Abs Immature Granulocytes 11/13/2019 0.01  0.00 - 0.07 K/uL Final   Performed at [facility], [address] [place], [place] [address]  . Prothrombin Time 11/13/2019 16.0* 11.4 - 15.2 seconds Final  . INR 11/13/2019 1.3* 0.8 - 1.2 Final   Comment: (NOTE) INR goal varies based on device and disease states. Performed at [facility], [address], [place], [place] [address]   . SURGICAL PATHOLOGY 11/13/2019    Final-Edited  Value:SURGICAL PATHOLOGY CASE: [id number] PATIENT: Laurie [name] Bone Marrow Report  Clinical History: Monoclonal gammopathy, right ilium, (ADC)  DIAGNOSIS:  BONE MARROW, ASPIRATE, CLOT, CORE: -Hypercellular bone marrow with extensive involvement by plasma cell neoplasm -See  comment  PERIPHERAL BLOOD: -Macrocytic anemia -Leukopenia  COMMENT:  The bone marrow shows extensive infiltration by atypical plasma cells representing 70% of all cells in the aspirate associated with prominent interstitial infiltrates and diffuse sheets in the clot and biopsy sections.  The plasma cells display kappa light chain restriction consistent with plasma cell neoplasm.  Correlation with cytogenetic and FISH studies is recommended.  MICROSCOPIC DESCRIPTION:  PERIPHERAL BLOOD SMEAR: The red blood cells display mild anisopoikilocytosis with mild polychromasia.  Prominent rouleaux formation is seen.  The white blood cells are decreased in number with no significant mor                         phologic abnormalities.  The platelets are normal in number.  BONE MARROW ASPIRATE: Bone marrow particles present Erythroid precursors: Progressive maturation with scattered maturing precursors displaying nuclear cytoplasmic dyssynchrony Granulocytic precursors: Orderly and progressive maturation Megakaryocytes: Present with predominantly normal morphology Lymphocytes/plasma cells: The plasma cells are markedly increased in number representing 70% of all cells associated with atypical cytomorphologic features characterized by cytomegaly and/or small nucleoli significant lymphoid aggregates are not present.  TOUCH PREPARATIONS: Mixture of cell types with prominent increase in atypical plasma cells  CLOT AND BIOPSY: The sections show 70 to 90% cellularity with interstitial infiltrates and diffuse sheets of atypical plasma cells characterized by partially clumped to vesicular chromatin and prominent nucleoli.  Residual trilineage hematopoiesis is variably present in the                          background. Immunohistochemical stain for CD138 and in situ hybridization for kappa and lambda were performed on blocks B1 and C1 with appropriate controls.  CD138 highlights the extensive  plasma cell component in the bone marrow which shows kappa light chain restriction.   IRON STAIN: Iron stains are performed on a bone marrow aspirate or touch imprint smear and section of clot. The controls stained appropriately.       Storage Iron: Decreased      Ring Sideroblasts: Absent  ADDITIONAL DATA/TESTING: The specimen was sent for cytogenetic analysis and FISH for multiple myeloma and a separate report will follow  CELL COUNT DATA:  Bone Marrow count performed on 500 cells shows: Blasts:   0%   Myeloid:  17% Promyelocytes: 0%   Erythroid:     11% Myelocytes:    1%   Lymphocytes:   2% Metamyelocytes:     1%   Plasma cells:  70% Bands:    3% Neutrophils:   11%  M:E ratio:     1.5 Eosinophils:   1% Basophils:     0% Monocytes:     0%  Lab Data: CBC performed on                          11/13/19 shows: WBC: 3.0 k/uL  Neutrophils:   67% Hgb: 8.7 g/dL  Lymphocytes:   21% HCT: 26.3 %    Monocytes:     6% MCV: 109.6 fL  Eosinophils:   5% RDW: 14 % Basophils:     1% PLT: 239 k/uL  GROSS DESCRIPTION:  A: Aspirate smear  B: Received in  B-plus fixative are tissue fragments measuring 0.4 x 0.3 x 0.1 cm in aggregate.  The specimen is submitted in toto.  C: Received in B-plus fixative is a 1.4 x 0.2 cm core of bone which is submitted in toto following decalcification.  [facility] 11/13/2019)  Final Diagnosis performed by Susanne Greenhouse, MD.   Electronically signed 11/15/2019 Technical and / or Professional components performed at [facility], [place] [address] [place], [place] [address].  Immunohistochemistry Technical component (if applicable) was performed at [facility]. [address] [place], [place], [place] [address].   IMMUNOHISTOCHEMISTRY DISCLAIMER (if applicable): Some of these immunohistochemical stains may hav                         e been developed and the performance characteristics determine by [facility].  Some may not have been cleared or approved by the U.S. Food and Drug Administration. The FDA has determined that such clearance or approval is not necessary. This test is used for clinical purposes. It should not be regarded as investigational or for research. This laboratory is certified under the [employer] (CLIA-88) as qualified to perform high complexity clinical laboratory testing.  The controls stained appropriately.    Assessment:  Laurie [name] is a 74 y.o. female with stage III biclonal IgA multiple myeloma.  SPEP was 5.0 gm/dL on 10/03/2019. Random urine revealed 196.6 mg/dL total protein with 58.3% M-spike.    Bone marrow biopsy on 11/13/2019 revealed a hypercellular bone marrow with extensive involvement by plasma cell neoplasm. Atypical plasma cells represented 70% of all cells in the aspirate and was associated with prominent interstitial infiltrates and diffuse sheets in the clot.  Plasma cells were kappa light chain restricted.   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     Labs on 10/03/2019 revealed a hematocrit 27.9, hemoglobin 9.4, MCV 108, platelets 287,000, WBC 3,800 (ANC 2400).   Creatinine was 0.94, albumin 3.4, and protein 11.2.  Ferritin was 82 with an iron saturation of 30% and a TIBC of 206.  Hepatitis C antibody and HIV testing were negative.    Work-up on 10/26/2019 revealed a hematocrit was 27.4, hemoglobin 9.1, MCV 109.6, platelets 250,000, WBC 3,400 (ANC 2,100).  Retic was 2.2%.  Creatinine was 1.06 and calcium 8.6. Total protein was 11.4 and albumin 3.2.  M-spike was 5.7 gm/dL biclonal IgA protein with kappa specificity.  Kappa free light chain were  1,376.3, lambda free light chains <1.5, and ratio > 917.53 (0.26-1.65).  Beta 2 microglobulin was 10.3 (0.6 - 2.4). LDH was 102. TSH was 3.673.  Hepatitis serologies were negative on 11/28/2019.  24 hour UPEP on 10/30/2019 revealed 1020 mg/24 hours protein with 65.3% M-spike (665.9 mg/24 hours).  Urine free kappa light chain were 2,620.44, free lambda light chain 6.01 and ratio 436.01 (1.03-31.76).  Bone survey on 10/26/2019 revealed small lytic lesions in the skull. There was no acute fracture.  She began [place] on 12/05/2019.  She is day 15 of cycle #1 daratumumab SQ and Decadron (11/29/2019 - 12/06/2019).  She has B12 deficiency. Vitamin B12 was 200 (low) and folate >20.0 on 10/03/2019.  She was on weekly B12 injections x 3  and is now on monthly B12 injections.  She is a Restaurant manager, fast food.  She receives Retacrit (last 11/28/2019) and IV iron support.  Ferritin was 59 on 11/27/2019.  She received Venofer on 12/05/2019.  She received both doses of the COVID-19 vaccine in 08/2019.  Symptomatically, she is doing well.  She denies any side effects of treatment.  Exam is stable.  Plan: 1.   Labs today: CBC with diff and CMP. 2.   Stage III multiple myeloma             Patient has a 5.7 gm/dL biclonal IgA gammopathy with kappa specificity.             Kappa free light chains were 1,376.3 with a ratio > 917.53 (0.26-1.65).               Beta2 microglobulin was 10.3 (0.6 - 2.4).              Bone survey reveals lytic lesions in skull.             Bone marrow biopsy revealed 70% plasma cells.                         Cytogenetics are complex.             Continue [address] (cycle length 28 days).                         Hold Revlimid with first cycle.                         Daratumumab SQ 1800 mg on days 1, 8, 15, and 22 for cycles #1 and #2.                         Revlimid 10 mg po day 1-21 cycle #2.                         Decadron 20 mg po days 1-2, 8-9, 15-16, and 22-23.                          Cycles #3-6 and 7 and beyond as previously stated.             Prophylaxis:                         Singulair 10 mg po the day before, the day of and 2 days after daratumumab.                         Acyclovir 400 mg po BID.                         Aspirin 81 mg a day when Revlimid initiated.             Supportive care:                         She received Xgeva on 12/05/2019.  Patient is day 15 of cycle #1 [address].  Labs reviewed.  Proceed with treatment.  Discuss symptom management.  She has antiemetics and pain medications at home to use on a prn bases.  Interventions are  adequate.   .  3.   Anemia             Hematocrit 30.1.  Hemoglobin 9.6.  MCV 111.5.             Etiology secondary to myeloma.             She is unable to receive PRBCs.             Patient will receive aggressive supportive care with Venofer and Retacrit.                         Ferritin goal 100-400.                         Hemoglobin goal 11.             Retacrit today.  She also received Venofer. 4.   B12 deficiency             B12 was 200 (low) on 10/03/2019.             Diet remains modest.             She receives monthly B12 injections.             Intrinsic factor and antiparietal antibodies were negative on 12/05/2019.             Continue monthly B12 in clinic 5.   Day #15 daratumumab today. 6.   Retacrit today. 7.   RTC in 1 week for MD assessment, labs (CBC with diff, CMP, Mg, ferritin), day #22 daratumumab, and decision about Revlimid next cycle.  I discussed the assessment and treatment plan with the patient.  The patient was provided an opportunity to ask questions and all were answered.  The patient agreed with the plan and demonstrated an understanding of the instructions.  The patient was advised to call back if the symptoms worsen or if the condition fails to improve as anticipated.   Quamir Willemsen C. Mike Gip, MD, PhD    12/13/2019, 10:23 AM  I, Mirian Mo Tufford, am acting as a Education administrator for  [employer]. Mike Gip, MD.   I, Latonja Bobeck C. Mike Gip, MD, have reviewed the above documentation for accuracy and completeness, and I agree with the above.

## 2019-12-13 ENCOUNTER — Encounter: Payer: Self-pay | Admitting: Hematology and Oncology

## 2019-12-13 ENCOUNTER — Inpatient Hospital Stay: Payer: Medicare HMO

## 2019-12-13 ENCOUNTER — Other Ambulatory Visit: Payer: Self-pay

## 2019-12-13 ENCOUNTER — Inpatient Hospital Stay (HOSPITAL_BASED_OUTPATIENT_CLINIC_OR_DEPARTMENT_OTHER): Payer: Medicare HMO | Admitting: Hematology and Oncology

## 2019-12-13 VITALS — BP 137/87 | HR 100 | Temp 96.7°F | Resp 18 | Wt 209.4 lb

## 2019-12-13 VITALS — BP 132/80 | HR 98 | Temp 96.8°F | Resp 18

## 2019-12-13 DIAGNOSIS — D649 Anemia, unspecified: Secondary | ICD-10-CM | POA: Diagnosis not present

## 2019-12-13 DIAGNOSIS — E538 Deficiency of other specified B group vitamins: Secondary | ICD-10-CM | POA: Diagnosis not present

## 2019-12-13 DIAGNOSIS — Z5112 Encounter for antineoplastic immunotherapy: Secondary | ICD-10-CM | POA: Diagnosis not present

## 2019-12-13 DIAGNOSIS — C9000 Multiple myeloma not having achieved remission: Secondary | ICD-10-CM

## 2019-12-13 DIAGNOSIS — M899 Disorder of bone, unspecified: Secondary | ICD-10-CM

## 2019-12-13 LAB — COMPREHENSIVE METABOLIC PANEL
ALT: 17 U/L (ref 0–44)
AST: 18 U/L (ref 15–41)
Albumin: 3 g/dL — ABNORMAL LOW (ref 3.5–5.0)
Alkaline Phosphatase: 57 U/L (ref 38–126)
Anion gap: 7 (ref 5–15)
BUN: 24 mg/dL — ABNORMAL HIGH (ref 8–23)
CO2: 24 mmol/L (ref 22–32)
Calcium: 8.7 mg/dL — ABNORMAL LOW (ref 8.9–10.3)
Chloride: 104 mmol/L (ref 98–111)
Creatinine, Ser: 0.98 mg/dL (ref 0.44–1.00)
GFR calc Af Amer: >60 mL/min (ref >60)
GFR calc non Af Amer: 58 mL/min — ABNORMAL LOW (ref >60)
Glucose, Bld: 111 mg/dL — ABNORMAL HIGH (ref 70–99)
Potassium: 4 mmol/L (ref 3.5–5.1)
Sodium: 135 mmol/L (ref 135–145)
Total Bilirubin: 0.6 mg/dL (ref 0.3–1.2)
Total Protein: 10 g/dL — ABNORMAL HIGH (ref 6.5–8.1)

## 2019-12-13 LAB — CBC WITH DIFFERENTIAL/PLATELET
Abs Immature Granulocytes: 0.01 10*3/uL (ref 0.00–0.07)
Basophils Absolute: 0 10*3/uL (ref 0.0–0.1)
Basophils Relative: 1 %
Eosinophils Absolute: 0.1 10*3/uL (ref 0.0–0.5)
Eosinophils Relative: 3 %
HCT: 30.1 % — ABNORMAL LOW (ref 36.0–46.0)
Hemoglobin: 9.6 g/dL — ABNORMAL LOW (ref 12.0–15.0)
Immature Granulocytes: 0 %
Lymphocytes Relative: 23 %
Lymphs Abs: 0.8 10*3/uL (ref 0.7–4.0)
MCH: 35.6 pg — ABNORMAL HIGH (ref 26.0–34.0)
MCHC: 31.9 g/dL (ref 30.0–36.0)
MCV: 111.5 fL — ABNORMAL HIGH (ref 80.0–100.0)
Monocytes Absolute: 0.2 10*3/uL (ref 0.1–1.0)
Monocytes Relative: 5 %
Neutro Abs: 2.3 10*3/uL (ref 1.7–7.7)
Neutrophils Relative %: 68 %
Platelets: 335 10*3/uL (ref 150–400)
RBC: 2.7 MIL/uL — ABNORMAL LOW (ref 3.87–5.11)
RDW: 14.6 % (ref 11.5–15.5)
WBC: 3.4 10*3/uL — ABNORMAL LOW (ref 4.0–10.5)
nRBC: 0 % (ref 0.0–0.2)

## 2019-12-13 MED ORDER — IRON SUCROSE 20 MG/ML IV SOLN
200.0000 mg | Freq: Once | INTRAVENOUS | Status: AC
  Administered 2019-12-13: 200 mg via INTRAVENOUS
  Filled 2019-12-13: qty 10

## 2019-12-13 MED ORDER — DARATUMUMAB-HYALURONIDASE-FIHJ 1800-30000 MG-UT/15ML ~~LOC~~ SOLN
1800.0000 mg | Freq: Once | SUBCUTANEOUS | Status: AC
  Administered 2019-12-13: 1800 mg via SUBCUTANEOUS
  Filled 2019-12-13: qty 15

## 2019-12-13 MED ORDER — ACETAMINOPHEN 325 MG PO TABS
ORAL_TABLET | ORAL | Status: AC
  Filled 2019-12-13: qty 2

## 2019-12-13 MED ORDER — DIPHENHYDRAMINE HCL 25 MG PO CAPS
ORAL_CAPSULE | ORAL | Status: AC
  Filled 2019-12-13: qty 2

## 2019-12-13 MED ORDER — DIPHENHYDRAMINE HCL 25 MG PO CAPS
50.0000 mg | ORAL_CAPSULE | Freq: Once | ORAL | Status: AC
  Administered 2019-12-13: 50 mg via ORAL

## 2019-12-13 MED ORDER — ONDANSETRON HCL 4 MG PO TABS
8.0000 mg | ORAL_TABLET | Freq: Once | ORAL | Status: AC
  Administered 2019-12-13: 8 mg via ORAL

## 2019-12-13 MED ORDER — ACETAMINOPHEN 325 MG PO TABS
650.0000 mg | ORAL_TABLET | Freq: Once | ORAL | Status: AC
  Administered 2019-12-13: 650 mg via ORAL

## 2019-12-13 MED ORDER — SODIUM CHLORIDE 0.9 % IV SOLN: 200.0000 mg | Freq: Once | INTRAVENOUS | Status: DC

## 2019-12-13 MED ORDER — SODIUM CHLORIDE 0.9 % IV SOLN
Freq: Once | INTRAVENOUS | Status: AC
  Filled 2019-12-13: qty 250

## 2019-12-13 MED ORDER — DEXAMETHASONE 4 MG PO TABS
ORAL_TABLET | ORAL | Status: AC
  Filled 2019-12-13: qty 5

## 2019-12-13 MED ORDER — ONDANSETRON 8 MG PO TBDP
ORAL_TABLET | ORAL | Status: AC
  Filled 2019-12-13: qty 1

## 2019-12-13 MED ORDER — DEXAMETHASONE 4 MG PO TABS
20.0000 mg | ORAL_TABLET | Freq: Once | ORAL | Status: AC
  Administered 2019-12-13: 20 mg via ORAL

## 2019-12-13 MED ORDER — EPOETIN ALFA-EPBX 10000 UNIT/ML IJ SOLN
10000.0000 [IU] | Freq: Once | INTRAMUSCULAR | Status: AC
  Administered 2019-12-13: 10000 [IU] via SUBCUTANEOUS
  Filled 2019-12-13: qty 1

## 2019-12-13 NOTE — Progress Notes (Signed)
Patient states that she is having some increased asthma related SOB today.

## 2019-12-18 NOTE — Progress Notes (Signed)
[facility]  [address], [address] [place], [place] [address] Phone: [phone]  Fax: [phone]   Clinic Day:  12/20/2019  Referring physician: Denton Lank, MD  Chief Complaint: Laurie [name] is a 74 y.o. female with multiple myeloma who is seen for assessment prior to day 22 of cycle #1 daratumumab SQ and Decadron.  HPI: The patient was last seen in the hematology clinic on 12/13/2019. At that time, she felt "fine".  She denied any nausea or vomiting.  She was tolerating treatment well.  Hematocrit was 30.1, hemoglobin 9.6, MCV 111.5, platelets 335,000, WBC 3,400 (ANC 2,300). Calcium was 8.7.  Creatinine 0.98 (CrCl 58 ml/min).  Total protein 10.0 (improved).  She received day 15 durvalumab SQ.  She received Retacrit 10,000 units SQ and Venofer 200 mg IV.  During the interim, she has been doing "the same." She does not feel short of breath. Her energy has been good. Her other symptoms are all stable. She has received Revlimid in the mail but has not started taking it yet.  The patient takes her Singulair and Acyclovir as prescribed. She received oral Decadron with her treatment last week and it went well.   Past Medical History:  Diagnosis Date  . Anxiety   . Asthma   . Depression   . Head injury   . Osteoporosis     Past Surgical History:  Procedure Laterality Date  . ABDOMINAL HYSTERECTOMY    . HEMORROIDECTOMY    . SEPTOPLASTY      Family History  Problem Relation Age of Onset  . Breast cancer Cousin        mat cousin    Social History:  reports that she has never smoked. She has never used smokeless tobacco. She reports that she does not drink alcohol and does not use drugs.  She denies any exposure to radiation or toxins. She denies alcohol and tobacco use. Her husband has schizophrenia and committed suicide. The patient is retired and has not worked since 30. 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  She then worked in the [employer] x 5 years until disability at age 77.  She lives in [place], [place]. She has a Neurosurgeon named Phoebe.  She is a Sales promotion account executive [employer], and will not accept blood products.  The patient is alone today.   Allergies:  Allergies  Allergen Reactions  . Levofloxacin     Critical   . Other Other (See Comments)    Antihistamines    Current Medications: Current Outpatient Medications  Medication Sig Dispense Refill  . acyclovir (ZOVIRAX) 400 MG tablet Take 1 tablet (400 mg total) by mouth 2 (two) times daily. 60 tablet 11  . albuterol (VENTOLIN HFA) 108 (90 Base) MCG/ACT inhaler 2 inhalations every 4 (four) hours as needed.    Marland Kitchen alendronate (FOSAMAX) 70 MG tablet     . atorvastatin (LIPITOR) 20 MG tablet Take by mouth.    . dexamethasone (DECADRON) 4 MG tablet Take 5 tablets (20 mg total) by mouth once a week. Take 5 tablets ('20mg'$ ) weekly the day after daratumumab for 8 weeks. Take with breakfast. 40 tablet 0  . docusate sodium (COLACE) 100 MG capsule Take 100 mg by mouth daily.    . fluticasone-salmeterol (ADVAIR HFA) 230-21 MCG/ACT inhaler Inhale into the lungs.    . montelukast (SINGULAIR) 10 MG tablet Take 10 mg by mouth at bedtime.    Marland Kitchen  naproxen (NAPROSYN) 500 MG tablet Take 500 mg by mouth 2 (two) times daily with a meal.    . nortriptyline (PAMELOR) 25 MG capsule Take by mouth.    . sertraline (ZOLOFT) 100 MG tablet Take 200 mg by mouth daily.     . traZODone (DESYREL) 100 MG tablet Take 25 mg by mouth at bedtime.     . Vitamin D, Ergocalciferol, (DRISDOL) 50000 UNITS CAPS capsule Take 50,000 Units by mouth every 7 (seven) days.    . ondansetron (ZOFRAN) 8 MG tablet Take 1 tablet (8 mg total) by mouth every 8 (eight) hours as needed (Nausea or vomiting). (Patient not taking: Reported on 11/28/2019) 30 tablet 1   No current facility-administered medications for this visit.   Review of Systems  Constitutional: Negative for chills, diaphoresis,  fever, malaise/fatigue and weight loss (up 2 lbs).       Feels "fine."  HENT: Negative for congestion, ear discharge, ear pain, hearing loss, nosebleeds, sinus pain, sore throat and tinnitus.   Eyes: Negative.  Negative for blurred vision.  Respiratory: Negative for cough, hemoptysis, sputum production and shortness of breath (asthma/heat related).        Asthma.  Cardiovascular: Negative for chest pain, palpitations and leg swelling.  Gastrointestinal: Negative for abdominal pain, blood in stool, constipation (on stool softener), diarrhea, heartburn (on medication), melena, nausea and vomiting.  Genitourinary: Negative.  Negative for dysuria, frequency, hematuria and urgency.  Musculoskeletal: Negative.  Negative for back pain, falls, joint pain, myalgias and neck pain.  Skin: Negative.  Negative for itching and rash.  Neurological: Negative for dizziness, tingling, sensory change, weakness and headaches.       Occasional balance problems. Traumatic brain injury (fall) 3 years ago.  Endo/Heme/Allergies: Negative.  Does not bruise/bleed easily.  Psychiatric/Behavioral: Negative.  Negative for depression and memory loss. The patient is not nervous/anxious and does not have insomnia.   All other systems reviewed and are negative.  Performance status (ECOG): 0  Vitals Blood pressure 133/70, pulse (!) 109, temperature 97.8 F (36.6 C), temperature source Tympanic, resp. rate 18, height '5\' 5"'$  (1.651 m), weight 211 lb 13.8 oz (96.1 kg), SpO2 99 %.   Physical Exam Vitals and nursing note reviewed.  Constitutional:      General: She is not in acute distress.    Appearance: Normal appearance. She is not diaphoretic.  HENT:     Head: Normocephalic and atraumatic.     Comments: Shoulder length gray hair.  Mask.    Ears:     Comments: Glasses.  Blue eyes.    Mouth/Throat:     Mouth: Mucous membranes are moist.     Pharynx: Oropharynx is clear.  Eyes:     General: No scleral icterus.     Extraocular Movements: Extraocular movements intact.     Conjunctiva/sclera: Conjunctivae normal.     Pupils: Pupils are equal, round, and reactive to light.  Cardiovascular:     Rate and Rhythm: Normal rate and regular rhythm.     Heart sounds: Normal heart sounds. No murmur heard.   Pulmonary:     Effort: Pulmonary effort is normal. No respiratory distress.     Breath sounds: Normal breath sounds. No wheezing or rales.  Chest:     Chest wall: No tenderness.  Abdominal:     General: Bowel sounds are normal. There is no distension.     Palpations: Abdomen is soft. There is no hepatomegaly, splenomegaly or mass.     Tenderness: There  is no abdominal tenderness. There is no guarding or rebound.  Musculoskeletal:        General: No swelling or tenderness. Normal range of motion.     Cervical back: Normal range of motion and neck supple.  Lymphadenopathy:     Head:     Right side of head: No preauricular, posterior auricular or occipital adenopathy.     Left side of head: No preauricular, posterior auricular or occipital adenopathy.     Cervical: No cervical adenopathy.     Upper Body:     Right upper body: No supraclavicular or axillary adenopathy.     Left upper body: No supraclavicular or axillary adenopathy.     Lower Body: No right inguinal adenopathy. No left inguinal adenopathy.  Skin:    General: Skin is warm and dry.  Neurological:     Mental Status: She is alert and oriented to person, place, and time. Mental status is at baseline.  Psychiatric:        Mood and Affect: Affect normal.        Behavior: Behavior normal.        Thought Content: Thought content normal.        Judgment: Judgment normal.    No visits with results within 3 Day(s) from this visit.  Latest known visit with results is:  Hospital Outpatient Visit on 11/13/2019  Component Date Value Ref Range Status  . WBC 11/13/2019 3.0* 4.0 - 10.5 K/uL Final  . RBC 11/13/2019 2.40* 3.87 - 5.11 MIL/uL Final  .  Hemoglobin 11/13/2019 8.7* 12.0 - 15.0 g/dL Final  . HCT 11/13/2019 26.3* 36 - 46 % Final  . MCV 11/13/2019 109.6* 80.0 - 100.0 fL Final  . MCH 11/13/2019 36.3* 26.0 - 34.0 pg Final  . MCHC 11/13/2019 33.1  30.0 - 36.0 g/dL Final  . RDW 11/13/2019 14.0  11.5 - 15.5 % Final  . Platelets 11/13/2019 239  150 - 400 K/uL Final  . nRBC 11/13/2019 0.0  0.0 - 0.2 % Final  . Neutrophils Relative % 11/13/2019 66  % Final  . Neutro Abs 11/13/2019 2.0  1.7 - 7.7 K/uL Final  . Lymphocytes Relative 11/13/2019 23  % Final  . Lymphs Abs 11/13/2019 0.7  0.7 - 4.0 K/uL Final  . Monocytes Relative 11/13/2019 7  % Final  . Monocytes Absolute 11/13/2019 0.2  0 - 1 K/uL Final  . Eosinophils Relative 11/13/2019 4  % Final  . Eosinophils Absolute 11/13/2019 0.1  0 - 0 K/uL Final  . Basophils Relative 11/13/2019 0  % Final  . Basophils Absolute 11/13/2019 0.0  0 - 0 K/uL Final  . Immature Granulocytes 11/13/2019 0  % Final  . Abs Immature Granulocytes 11/13/2019 0.01  0.00 - 0.07 K/uL Final   Performed at [facility], [address] [place], [place] [address]  . Prothrombin Time 11/13/2019 16.0* 11.4 - 15.2 seconds Final  . INR 11/13/2019 1.3* 0.8 - 1.2 Final   Comment: (NOTE) INR goal varies based on device and disease states. Performed at [facility], [address], [place],  [address]   . SURGICAL PATHOLOGY 11/13/2019    Final-Edited                   Value:SURGICAL PATHOLOGY CASE: [id number] PATIENT: Lalanya Racca Bone Marrow Report  Clinical History: Monoclonal gammopathy, right ilium, (ADC)  DIAGNOSIS:  BONE MARROW, ASPIRATE, CLOT, CORE: -Hypercellular bone marrow with extensive involvement by plasma cell neoplasm -See  comment  PERIPHERAL BLOOD: -Macrocytic anemia -Leukopenia  COMMENT:  The bone marrow shows extensive infiltration by atypical plasma cells representing 70% of all cells in the aspirate associated with prominent interstitial infiltrates  and diffuse sheets in the clot and biopsy sections.  The plasma cells display kappa light chain restriction consistent with plasma cell neoplasm.  Correlation with cytogenetic and FISH studies is recommended.  MICROSCOPIC DESCRIPTION:  PERIPHERAL BLOOD SMEAR: The red blood cells display mild anisopoikilocytosis with mild polychromasia.  Prominent rouleaux formation is seen.  The white blood cells are decreased in number with no significant mor                         phologic abnormalities.  The platelets are normal in number.  BONE MARROW ASPIRATE: Bone marrow particles present Erythroid precursors: Progressive maturation with scattered maturing precursors displaying nuclear cytoplasmic dyssynchrony Granulocytic precursors: Orderly and progressive maturation Megakaryocytes: Present with predominantly normal morphology Lymphocytes/plasma cells: The plasma cells are markedly increased in number representing 70% of all cells associated with atypical cytomorphologic features characterized by cytomegaly and/or small nucleoli significant lymphoid aggregates are not present.  TOUCH PREPARATIONS: Mixture of cell types with prominent increase in atypical plasma cells  CLOT AND BIOPSY: The sections show 70 to 90% cellularity with interstitial infiltrates and diffuse sheets of atypical plasma cells characterized by partially clumped to vesicular chromatin and prominent nucleoli.  Residual trilineage hematopoiesis is variably present in the                          background. Immunohistochemical stain for CD138 and in situ hybridization for kappa and lambda were performed on blocks B1 and C1 with appropriate controls.  CD138 highlights the extensive plasma cell component in the bone marrow which shows kappa light chain restriction.   IRON STAIN: Iron stains are performed on a bone marrow aspirate or touch imprint smear and section of clot. The controls stained appropriately.        Storage Iron: Decreased      Ring Sideroblasts: Absent  ADDITIONAL DATA/TESTING: The specimen was sent for cytogenetic analysis and FISH for multiple myeloma and a separate report will follow  CELL COUNT DATA:  Bone Marrow count performed on 500 cells shows: Blasts:   0%   Myeloid:  17% Promyelocytes: 0%   Erythroid:     11% Myelocytes:    1%   Lymphocytes:   2% Metamyelocytes:     1%   Plasma cells:  70% Bands:    3% Neutrophils:   11%  M:E ratio:     1.5 Eosinophils:   1% Basophils:     0% Monocytes:     0%  Lab Data: CBC performed on                          11/13/19 shows: WBC: 3.0 k/uL  Neutrophils:   67% Hgb: 8.7 g/dL  Lymphocytes:   21% HCT: 26.3 %    Monocytes:     6% MCV: 109.6 fL  Eosinophils:   5% RDW: 14 % Basophils:     1% PLT: 239 k/uL  GROSS DESCRIPTION:  A: Aspirate smear  B: Received in B-plus fixative are tissue fragments measuring 0.4 x 0.3 x 0.1 cm in aggregate.  The specimen is submitted in toto.  C: Received in B-plus fixative is a 1.4 x 0.2 cm core of bone which  is submitted in toto following decalcification.  [facility] 11/13/2019)  Final Diagnosis performed by Susanne Greenhouse, MD.   Electronically signed 11/15/2019 Technical and / or Professional components performed at [facility], [place] [address], [place], [place] [address].  Immunohistochemistry Technical component (if applicable) was performed at [facility]. [address], [place], [place], [place] [address].   IMMUNOHISTOCHEMISTRY DISCLAIMER (if applicable): Some of these immunohistochemical stains may hav                         e been developed and the performance characteristics determine by [facility] Some may not have been cleared or approved by the U.S. Food and Drug Administration. The FDA has determined that such clearance or approval is not necessary. This test is used for clinical purposes. It should not be regarded as investigational  or for research. This laboratory is certified under the [place] (CLIA-88) as qualified to perform high complexity clinical laboratory testing.  The controls stained appropriately.    Assessment:  FLORIENE JESCHKE is a 74 y.o. female with stage III biclonal IgA multiple myeloma.  SPEP was 5.0 gm/dL on 10/03/2019. Random urine revealed 196.6 mg/dL total protein with 58.3% M-spike.    Bone marrow biopsy on 11/13/2019 revealed a hypercellular bone marrow with extensive involvement by plasma cell neoplasm. Atypical plasma cells represented 70% of all cells in the aspirate and was associated with prominent interstitial infiltrates and diffuse sheets in the clot.  Plasma cells were kappa light chain restricted.   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     Labs on 10/03/2019 revealed a hematocrit 27.9, hemoglobin 9.4, MCV 108, platelets 287,000, WBC 3,800 (ANC 2400).   Creatinine was 0.94, albumin 3.4, and protein 11.2.  Ferritin was 82 with an iron saturation of 30% and a TIBC of 206.  Hepatitis C antibody and HIV testing were negative.    Work-up on 10/26/2019 revealed a hematocrit was 27.4, hemoglobin 9.1, MCV 109.6, platelets 250,000, WBC 3,400 (ANC 2,100).  Retic was 2.2%.  Creatinine was 1.06 and calcium 8.6. Total protein was 11.4 and albumin 3.2.  M-spike was 5.7 gm/dL biclonal IgA protein with kappa specificity.  Kappa free light chain were 1,376.3, lambda free light chains <1.5, and ratio > 917.53 (0.26-1.65).  Beta 2 microglobulin was 10.3 (0.6 - 2.4). LDH was 102. TSH was 3.673.  Hepatitis serologies were negative on 11/28/2019.  24 hour UPEP on 10/30/2019 revealed 1020 mg/24 hours  protein with 65.3% M-spike (665.9 mg/24 hours).  Urine free kappa light chain were 2,620.44, free lambda light chain 6.01 and ratio 436.01 (1.03-31.76).  Bone survey on 10/26/2019 revealed small lytic lesions in the skull. There was no acute fracture.  She is day 22 of cycle #1 daratumumab SQ and Decadron (11/29/2019 - 12/20/2019).  She has B12 deficiency. Vitamin B12 was 200 (low) and folate >20.0 on 10/03/2019.  She was on weekly B12 injections x 3 and is now on monthly B12 injections.  Intrinsic factor antibodies and antiparietal antibodies were negative on 12/05/2019.  She is a Restaurant manager, fast food.  She receives Retacrit (last 12/13/2019) and IV iron support.  Ferritin was 59 on 11/27/2019.  She received  Venofer on 12/05/2019 and 12/13/2019.  She received both doses of the COVID-19 vaccine in 08/2019.  Symptomatically, he denies any complaints.  She is tolerating treatment well.  Exam is stable.  Plan: 1.   Labs today: CBC with diff, CMP, Mg. 2.   Stage III multiple myeloma             Patient has a 5.7 gm/dL biclonal IgA gammopathy with kappa specificity.             Kappa free light chains were 1,376.3 with a ratio > 917.53 (0.26-1.65).               Beta2 microglobulin was 10.3 (0.6 - 2.4).              Bone survey reveals lytic lesions in skull.             Bone marrow biopsy revealed 70% plasma cells.                         Cytogenetics are complex.             Continue [address] (cycle length 28 days).                         Hold Revlimid with first cycle.                         Daratumumab SQ 1800 mg on days 1, 8, 15, and 22 for cycles #1 and #2.                         Revlimid 10 mg po day 1-21 cycle #2.                         Decadron 20 mg po days 1-2, 8-9, 15-16, and 22-23.                         Cycles #3-6 and 7 and beyond as previously stated.             Prophylaxis:                         Singulair 10 mg po the day before, the day of and 2 days after  daratumumab.                         Acyclovir 400 mg po BID.                         Aspirin 81 mg a day when Revlimid initiated.             Supportive care:                         Xgeva or Zometa.   Labs reviewed.  Begin day 22 of cycle #1 daratumumab SQ.  3.   Anemia             Hematocrit 29.7.  Hemoglobin 9.8.  MCV 11.17.             Etiology is secondary to myeloma.             Patient unable to receive transfusion support.  Continue aggressive supportive care measures with Venofer and Retacrit.                         Ferritin goal 100-400.                         Hemoglobin goal 11.             Retacrit plan for next week. 4.   B12 deficiency             B12 was 200 (low) on 10/03/2019.             B12 today and monthly x 6. 5.   Day #22 daratumumab today. 6.   RTC in 1 week for MD assessment, labs (CBC with diff, CMP, Mg, SPEP), Retacrit, and day 1 of cycle #2 daratumumab.  I discussed the assessment and treatment plan with the patient.  The patient was provided an opportunity to ask questions and all were answered.  The patient agreed with the plan and demonstrated an understanding of the instructions.  The patient was advised to call back if the symptoms worsen or if the condition fails to improve as anticipated.  I provided 13 minutes of face-to-face time during this this encounter and > 50% was spent counseling as documented under my assessment and plan.   Umaiza Matusik C. Mike Gip, MD, PhD    12/20/2019, 9:54 AM  I, Mirian Mo Tufford, am acting as a Education administrator for [employer]. Mike Gip, MD.   I, Sharilynn Cassity C. Mike Gip, MD,I, Arvella Massingale C. Mike Gip, MD, have reviewed the above documentation for accuracy and completeness, and I agree with the above.

## 2019-12-19 ENCOUNTER — Ambulatory Visit: Payer: Medicare HMO

## 2019-12-20 ENCOUNTER — Encounter: Payer: Self-pay | Admitting: Hematology and Oncology

## 2019-12-20 ENCOUNTER — Inpatient Hospital Stay: Payer: Medicare HMO | Admitting: Hematology and Oncology

## 2019-12-20 ENCOUNTER — Other Ambulatory Visit: Payer: Self-pay

## 2019-12-20 ENCOUNTER — Inpatient Hospital Stay (HOSPITAL_BASED_OUTPATIENT_CLINIC_OR_DEPARTMENT_OTHER): Payer: Medicare HMO | Admitting: Hematology and Oncology

## 2019-12-20 ENCOUNTER — Inpatient Hospital Stay: Payer: Medicare HMO

## 2019-12-20 VITALS — BP 143/81 | HR 98 | Resp 18

## 2019-12-20 VITALS — BP 133/70 | HR 109 | Temp 97.8°F | Resp 18 | Ht 65.0 in | Wt 211.9 lb

## 2019-12-20 DIAGNOSIS — D649 Anemia, unspecified: Secondary | ICD-10-CM

## 2019-12-20 DIAGNOSIS — E538 Deficiency of other specified B group vitamins: Secondary | ICD-10-CM

## 2019-12-20 DIAGNOSIS — Z5112 Encounter for antineoplastic immunotherapy: Secondary | ICD-10-CM | POA: Diagnosis not present

## 2019-12-20 DIAGNOSIS — C9000 Multiple myeloma not having achieved remission: Secondary | ICD-10-CM

## 2019-12-20 LAB — CBC WITH DIFFERENTIAL/PLATELET
Abs Immature Granulocytes: 0.01 10*3/uL (ref 0.00–0.07)
Basophils Absolute: 0 10*3/uL (ref 0.0–0.1)
Basophils Relative: 0 %
Eosinophils Absolute: 0.1 10*3/uL (ref 0.0–0.5)
Eosinophils Relative: 3 %
HCT: 29.7 % — ABNORMAL LOW (ref 36.0–46.0)
Hemoglobin: 9.8 g/dL — ABNORMAL LOW (ref 12.0–15.0)
Immature Granulocytes: 0 %
Lymphocytes Relative: 24 %
Lymphs Abs: 0.8 10*3/uL (ref 0.7–4.0)
MCH: 36.8 pg — ABNORMAL HIGH (ref 26.0–34.0)
MCHC: 33 g/dL (ref 30.0–36.0)
MCV: 111.7 fL — ABNORMAL HIGH (ref 80.0–100.0)
Monocytes Absolute: 0.2 10*3/uL (ref 0.1–1.0)
Monocytes Relative: 5 %
Neutro Abs: 2.1 10*3/uL (ref 1.7–7.7)
Neutrophils Relative %: 68 %
Platelets: 269 10*3/uL (ref 150–400)
RBC: 2.66 MIL/uL — ABNORMAL LOW (ref 3.87–5.11)
RDW: 14.7 % (ref 11.5–15.5)
WBC: 3.1 10*3/uL — ABNORMAL LOW (ref 4.0–10.5)
nRBC: 0 % (ref 0.0–0.2)

## 2019-12-20 LAB — COMPREHENSIVE METABOLIC PANEL
ALT: 16 U/L (ref 0–44)
AST: 18 U/L (ref 15–41)
Albumin: 3 g/dL — ABNORMAL LOW (ref 3.5–5.0)
Alkaline Phosphatase: 55 U/L (ref 38–126)
Anion gap: 8 (ref 5–15)
BUN: 21 mg/dL (ref 8–23)
CO2: 24 mmol/L (ref 22–32)
Calcium: 8.5 mg/dL — ABNORMAL LOW (ref 8.9–10.3)
Chloride: 106 mmol/L (ref 98–111)
Creatinine, Ser: 1.02 mg/dL — ABNORMAL HIGH (ref 0.44–1.00)
GFR calc Af Amer: >60 mL/min (ref >60)
GFR calc non Af Amer: 55 mL/min — ABNORMAL LOW (ref >60)
Glucose, Bld: 123 mg/dL — ABNORMAL HIGH (ref 70–99)
Potassium: 4 mmol/L (ref 3.5–5.1)
Sodium: 138 mmol/L (ref 135–145)
Total Bilirubin: 0.3 mg/dL (ref 0.3–1.2)
Total Protein: 9.5 g/dL — ABNORMAL HIGH (ref 6.5–8.1)

## 2019-12-20 LAB — FERRITIN: Ferritin: 87 ng/mL (ref 11–307)

## 2019-12-20 LAB — MAGNESIUM: Magnesium: 2 mg/dL (ref 1.7–2.4)

## 2019-12-20 MED ORDER — SODIUM CHLORIDE 0.9 % IV SOLN
Freq: Once | INTRAVENOUS | Status: DC
  Filled 2019-12-20: qty 250

## 2019-12-20 MED ORDER — CYANOCOBALAMIN 1000 MCG/ML IJ SOLN
1000.0000 ug | Freq: Once | INTRAMUSCULAR | Status: AC
  Administered 2019-12-20: 1000 ug via INTRAMUSCULAR
  Filled 2019-12-20: qty 1

## 2019-12-20 MED ORDER — ONDANSETRON HCL 4 MG PO TABS
8.0000 mg | ORAL_TABLET | Freq: Once | ORAL | Status: AC
  Administered 2019-12-20: 8 mg via ORAL
  Filled 2019-12-20: qty 2

## 2019-12-20 MED ORDER — DIPHENHYDRAMINE HCL 25 MG PO CAPS
50.0000 mg | ORAL_CAPSULE | Freq: Once | ORAL | Status: AC
  Administered 2019-12-20: 50 mg via ORAL
  Filled 2019-12-20: qty 2

## 2019-12-20 MED ORDER — DEXAMETHASONE 4 MG PO TABS
20.0000 mg | ORAL_TABLET | Freq: Once | ORAL | Status: AC
  Administered 2019-12-20: 20 mg via ORAL
  Filled 2019-12-20: qty 5

## 2019-12-20 MED ORDER — ACETAMINOPHEN 325 MG PO TABS
650.0000 mg | ORAL_TABLET | Freq: Once | ORAL | Status: AC
  Administered 2019-12-20: 650 mg via ORAL
  Filled 2019-12-20: qty 2

## 2019-12-20 MED ORDER — DARATUMUMAB-HYALURONIDASE-FIHJ 1800-30000 MG-UT/15ML ~~LOC~~ SOLN
1800.0000 mg | Freq: Once | SUBCUTANEOUS | Status: AC
  Administered 2019-12-20: 1800 mg via SUBCUTANEOUS
  Filled 2019-12-20: qty 15

## 2019-12-20 NOTE — Progress Notes (Signed)
12/20/19  Ok to treat with HR 109 as patient typically runs high per MD.  T.O. Dr Hillery Hunter, PharmD

## 2019-12-21 ENCOUNTER — Other Ambulatory Visit: Payer: Self-pay | Admitting: *Deleted

## 2019-12-26 NOTE — Progress Notes (Signed)
[facility]  [address], [address] [place], [place] [address] Phone: [phone]  Fax: [phone]   Clinic Day:  12/27/2019  Referring physician: Denton Lank, MD  Chief Complaint: Laurie [name] is a 74 y.o. female with IgA multiple myeloma who is seen for assessment prior to day 1 of cycle #2 daratumumab SQ, Revlimid and Decadron.  HPI: The patient was last seen in the hematology clinic on 12/20/2019. At that time, she was doing well.  She was tolerating treatment well. Hematocrit was 29.7, hemoglobin 9.8, MCV 111.7, platelets 269,000, WBC 3,100 (ANC 2,100). Creatinine was 1.02 (CrCl 55 ml/min). Calcium was 8.5. Total protein was 9.5 (improved). Albumin was 3.0. Ferritin was 87. Magnesium was 2.0. She received day #22 daratumumab SQ.  During the interim, she has been "ok".  Her energy level has improved. She took one nausea pill over the last week. She noted some blood in her stool one time because she was constipated. She denies any concerns of infection. Her balance problems and asthma are the same.  The patient takes her Singulair and acyclovir as prescribed. She brought her Revlimid to her appointment today.   Past Medical History:  Diagnosis Date  . Anxiety   . Asthma   . Depression   . Head injury   . Osteoporosis     Past Surgical History:  Procedure Laterality Date  . ABDOMINAL HYSTERECTOMY    . HEMORROIDECTOMY    . SEPTOPLASTY      Family History  Problem Relation Age of Onset  . Breast cancer Cousin        mat cousin    Social History:  reports that she has never smoked. She has never used smokeless tobacco. She reports that she does not drink alcohol and does not use drugs.  She denies any exposure to radiation or toxins. She denies alcohol and tobacco use. Her husband has schizophrenia and committed suicide. The patient is retired and has not worked since 25. 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  She then worked in the [employer] x 5 years until disability at age 82.  She lives in [place], [place]. She has a Neurosurgeon named Phoebe.  She is a Sales promotion account executive [employer], and will not accept blood products.  The patient is alone today.   Allergies:  Allergies  Allergen Reactions  . Levofloxacin     Critical   . Other Other (See Comments)    Antihistamines    Current Medications: Current Outpatient Medications  Medication Sig Dispense Refill  . acyclovir (ZOVIRAX) 400 MG tablet Take 1 tablet (400 mg total) by mouth 2 (two) times daily. 60 tablet 11  . albuterol (VENTOLIN HFA) 108 (90 Base) MCG/ACT inhaler 2 inhalations every 4 (four) hours as needed.    Marland Kitchen alendronate (FOSAMAX) 70 MG tablet     . atorvastatin (LIPITOR) 20 MG tablet Take by mouth.    . busPIRone (BUSPAR) 10 MG tablet Take 10 mg by mouth 2 (two) times daily.    Marland Kitchen dexamethasone (DECADRON) 4 MG tablet Take 5 tablets (20 mg total) by mouth once a week. Take 5 tablets ($RemoveBe'20mg'VoCHtNcuJ$ ) weekly the day after daratumumab for 8 weeks. Take with breakfast. 40 tablet 0  . docusate sodium (COLACE) 100 MG capsule Take 100 mg by mouth daily.    . fluticasone-salmeterol (ADVAIR HFA) 230-21 MCG/ACT inhaler Inhale into the lungs.    . montelukast (SINGULAIR) 10  MG tablet Take 10 mg by mouth at bedtime.    . naproxen (NAPROSYN) 500 MG tablet Take 500 mg by mouth 2 (two) times daily with a meal.    . nortriptyline (PAMELOR) 25 MG capsule Take by mouth.    Marland Kitchen omeprazole (PRILOSEC) 20 MG capsule Take 20 mg by mouth daily.    . sertraline (ZOLOFT) 100 MG tablet Take 200 mg by mouth daily.     . traZODone (DESYREL) 100 MG tablet Take 25 mg by mouth at bedtime.     . Vitamin D, Ergocalciferol, (DRISDOL) 50000 UNITS CAPS capsule Take 50,000 Units by mouth every 7 (seven) days.    . ondansetron (ZOFRAN) 8 MG tablet Take 1 tablet (8 mg total) by mouth every 8 (eight) hours as needed (Nausea or vomiting). (Patient not taking: Reported on  11/28/2019) 30 tablet 1   No current facility-administered medications for this visit.    Review of Systems  Constitutional: Negative for chills, diaphoresis, fever, malaise/fatigue and weight loss (stable).       Feels "ok".  HENT: Negative.  Negative for congestion, ear discharge, ear pain, hearing loss, nosebleeds, sinus pain, sore throat and tinnitus.   Eyes: Negative.  Negative for blurred vision.  Respiratory: Negative for cough, hemoptysis, sputum production and shortness of breath (asthma/heat related).        Asthma.  Cardiovascular: Negative.  Negative for chest pain, palpitations and leg swelling.  Gastrointestinal: Positive for blood in stool (one episode) and constipation (on stool softener). Negative for abdominal pain, diarrhea, heartburn (on medication), melena, nausea and vomiting.  Genitourinary: Negative.  Negative for dysuria, frequency, hematuria and urgency.  Musculoskeletal: Negative.  Negative for back pain, falls, joint pain, myalgias and neck pain.  Skin: Negative.  Negative for itching and rash.  Neurological: Negative for dizziness, tingling, sensory change, weakness and headaches.       Occasional balance problems. Traumatic brain injury (fall) 3 years ago.  Endo/Heme/Allergies: Negative.  Does not bruise/bleed easily.  Psychiatric/Behavioral: Negative.  Negative for depression and memory loss. The patient is not nervous/anxious and does not have insomnia.   All other systems reviewed and are negative.  Performance status (ECOG): 1  Vitals Blood pressure (!) 146/76, pulse (!) 108, temperature (!) 97.3 F (36.3 C), temperature source Tympanic, resp. rate 18, height $RemoveBe'5\' 5"'EhExFUywl$  (1.651 m), weight 211 lb 13.8 oz (96.1 kg), SpO2 97 %.   Physical Exam Vitals and nursing note reviewed.  Constitutional:      General: She is not in acute distress.    Appearance: Normal appearance. She is not diaphoretic.  HENT:     Head: Normocephalic and atraumatic.     Comments:  Shoulder length gray hair.  Mask.    Ears:     Comments: Glasses.  Blue eyes.    Mouth/Throat:     Mouth: Mucous membranes are moist.     Pharynx: Oropharynx is clear.  Eyes:     General: No scleral icterus.    Extraocular Movements: Extraocular movements intact.     Conjunctiva/sclera: Conjunctivae normal.     Pupils: Pupils are equal, round, and reactive to light.  Cardiovascular:     Rate and Rhythm: Normal rate and regular rhythm.     Heart sounds: Normal heart sounds. No murmur heard.   Pulmonary:     Effort: Pulmonary effort is normal. No respiratory distress.     Breath sounds: Normal breath sounds. No wheezing or rales.  Chest:     Chest wall:  No tenderness.  Abdominal:     General: Bowel sounds are normal. There is no distension.     Palpations: Abdomen is soft. There is no hepatomegaly, splenomegaly or mass.     Tenderness: There is no abdominal tenderness. There is no guarding or rebound.  Musculoskeletal:        General: No swelling or tenderness. Normal range of motion.     Cervical back: Normal range of motion and neck supple.  Lymphadenopathy:     Head:     Right side of head: No preauricular, posterior auricular or occipital adenopathy.     Left side of head: No preauricular, posterior auricular or occipital adenopathy.     Cervical: No cervical adenopathy.     Upper Body:     Right upper body: No supraclavicular or axillary adenopathy.     Left upper body: No supraclavicular or axillary adenopathy.     Lower Body: No right inguinal adenopathy. No left inguinal adenopathy.  Skin:    General: Skin is warm and dry.  Neurological:     Mental Status: She is alert and oriented to person, place, and time. Mental status is at baseline.  Psychiatric:        Mood and Affect: Affect normal.        Behavior: Behavior normal.        Thought Content: Thought content normal.        Judgment: Judgment normal.    No visits with results within 3 Day(s) from this visit.    Latest known visit with results is:  Hospital Outpatient Visit on 11/13/2019  Component Date Value Ref Range Status  . WBC 11/13/2019 3.0* 4.0 - 10.5 K/uL Final  . RBC 11/13/2019 2.40* 3.87 - 5.11 MIL/uL Final  . Hemoglobin 11/13/2019 8.7* 12.0 - 15.0 g/dL Final  . HCT 11/13/2019 26.3* 36 - 46 % Final  . MCV 11/13/2019 109.6* 80.0 - 100.0 fL Final  . MCH 11/13/2019 36.3* 26.0 - 34.0 pg Final  . MCHC 11/13/2019 33.1  30.0 - 36.0 g/dL Final  . RDW 11/13/2019 14.0  11.5 - 15.5 % Final  . Platelets 11/13/2019 239  150 - 400 K/uL Final  . nRBC 11/13/2019 0.0  0.0 - 0.2 % Final  . Neutrophils Relative % 11/13/2019 66  % Final  . Neutro Abs 11/13/2019 2.0  1.7 - 7.7 K/uL Final  . Lymphocytes Relative 11/13/2019 23  % Final  . Lymphs Abs 11/13/2019 0.7  0.7 - 4.0 K/uL Final  . Monocytes Relative 11/13/2019 7  % Final  . Monocytes Absolute 11/13/2019 0.2  0 - 1 K/uL Final  . Eosinophils Relative 11/13/2019 4  % Final  . Eosinophils Absolute 11/13/2019 0.1  0 - 0 K/uL Final  . Basophils Relative 11/13/2019 0  % Final  . Basophils Absolute 11/13/2019 0.0  0 - 0 K/uL Final  . Immature Granulocytes 11/13/2019 0  % Final  . Abs Immature Granulocytes 11/13/2019 0.01  0.00 - 0.07 K/uL Final   Performed at [facility], [address] [place], [place] [address]  . Prothrombin Time 11/13/2019 16.0* 11.4 - 15.2 seconds Final  . INR 11/13/2019 1.3* 0.8 - 1.2 Final   Comment: (NOTE) INR goal varies based on device and disease states. Performed at [facility], [address], [place], [place] [address]   . SURGICAL PATHOLOGY 11/13/2019    Final-Edited  Value:SURGICAL PATHOLOGY CASE: [id number] PATIENT: Eliannah Camposano Bone Marrow Report  Clinical History: Monoclonal gammopathy, right ilium, (ADC)  DIAGNOSIS:  BONE MARROW, ASPIRATE, CLOT, CORE: -Hypercellular bone marrow with extensive involvement by plasma cell neoplasm -See comment  PERIPHERAL  BLOOD: -Macrocytic anemia -Leukopenia  COMMENT:  The bone marrow shows extensive infiltration by atypical plasma cells representing 70% of all cells in the aspirate associated with prominent interstitial infiltrates and diffuse sheets in the clot and biopsy sections.  The plasma cells display kappa light chain restriction consistent with plasma cell neoplasm.  Correlation with cytogenetic and FISH studies is recommended.  MICROSCOPIC DESCRIPTION:  PERIPHERAL BLOOD SMEAR: The red blood cells display mild anisopoikilocytosis with mild polychromasia.  Prominent rouleaux formation is seen.  The white blood cells are decreased in number with no significant mor                         phologic abnormalities.  The platelets are normal in number.  BONE MARROW ASPIRATE: Bone marrow particles present Erythroid precursors: Progressive maturation with scattered maturing precursors displaying nuclear cytoplasmic dyssynchrony Granulocytic precursors: Orderly and progressive maturation Megakaryocytes: Present with predominantly normal morphology Lymphocytes/plasma cells: The plasma cells are markedly increased in number representing 70% of all cells associated with atypical cytomorphologic features characterized by cytomegaly and/or small nucleoli significant lymphoid aggregates are not present.  TOUCH PREPARATIONS: Mixture of cell types with prominent increase in atypical plasma cells  CLOT AND BIOPSY: The sections show 70 to 90% cellularity with interstitial infiltrates and diffuse sheets of atypical plasma cells characterized by partially clumped to vesicular chromatin and prominent nucleoli.  Residual trilineage hematopoiesis is variably present in the                          background. Immunohistochemical stain for CD138 and in situ hybridization for kappa and lambda were performed on blocks B1 and C1 with appropriate controls.  CD138 highlights the extensive plasma cell component  in the bone marrow which shows kappa light chain restriction.   IRON STAIN: Iron stains are performed on a bone marrow aspirate or touch imprint smear and section of clot. The controls stained appropriately.       Storage Iron: Decreased      Ring Sideroblasts: Absent  ADDITIONAL DATA/TESTING: The specimen was sent for cytogenetic analysis and FISH for multiple myeloma and a separate report will follow  CELL COUNT DATA:  Bone Marrow count performed on 500 cells shows: Blasts:   0%   Myeloid:  17% Promyelocytes: 0%   Erythroid:     11% Myelocytes:    1%   Lymphocytes:   2% Metamyelocytes:     1%   Plasma cells:  70% Bands:    3% Neutrophils:   11%  M:E ratio:     1.5 Eosinophils:   1% Basophils:     0% Monocytes:     0%  Lab Data: CBC performed on                          11/13/19 shows: WBC: 3.0 k/uL  Neutrophils:   67% Hgb: 8.7 g/dL  Lymphocytes:   21% HCT: 26.3 %    Monocytes:     6% MCV: 109.6 fL  Eosinophils:   5% RDW: 14 % Basophils:     1% PLT: 239 k/uL  GROSS DESCRIPTION:  A: Aspirate smear  B: Received in  B-plus fixative are tissue fragments measuring 0.4 x 0.3 x 0.1 cm in aggregate.  The specimen is submitted in toto.  C: Received in B-plus fixative is a 1.4 x 0.2 cm core of bone which is submitted in toto following decalcification.  [facility] 11/13/2019)  Final Diagnosis performed by Susanne Greenhouse, MD.   Electronically signed 11/15/2019 Technical and / or Professional components performed at [facility], [place] [address], [place], [place] [address].  Immunohistochemistry Technical component (if applicable) was performed at [facility]. [address] [place], [place], [place] [address].   IMMUNOHISTOCHEMISTRY DISCLAIMER (if applicable): Some of these immunohistochemical stains may hav                         e been developed and the performance characteristics determine by [facility]. Some may not have been  cleared or approved by the U.S. Food and Drug Administration. The FDA has determined that such clearance or approval is not necessary. This test is used for clinical purposes. It should not be regarded as investigational or for research. This laboratory is certified under the [place] (CLIA-88) as qualified to perform high complexity clinical laboratory testing.  The controls stained appropriately.    Assessment:  KADE RICKELS is a 74 y.o. female with stage III biclonal IgA multiple myeloma.  SPEP was 5.0 gm/dL on 10/03/2019. Random urine revealed 196.6 mg/dL total protein with 58.3% M-spike.    Bone marrow biopsy on 11/13/2019 revealed a hypercellular bone marrow with extensive involvement by plasma cell neoplasm. Atypical plasma cells represented 70% of all cells in the aspirate and was associated with prominent interstitial infiltrates and diffuse sheets in the clot.  Plasma cells were kappa light chain restricted.   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     Labs on 10/03/2019 revealed a hematocrit 27.9, hemoglobin 9.4, MCV 108, platelets 287,000, WBC 3,800 (ANC 2400).   Creatinine was 0.94, albumin 3.4, and protein 11.2.  Ferritin was 82 with an iron saturation of 30% and a TIBC of 206.  Hepatitis C antibody and HIV testing were negative.    Work-up on 10/26/2019 revealed a hematocrit was 27.4, hemoglobin 9.1, MCV 109.6, platelets 250,000, WBC 3,400 (ANC 2,100).  Retic was 2.2%.  Creatinine was 1.06 and calcium 8.6. Total protein was 11.4 and albumin 3.2.  M-spike was 5.7 gm/dL biclonal IgA protein with kappa specificity.  Kappa free light chain were 1,376.3, lambda free light  chains <1.5, and ratio > 917.53 (0.26-1.65).  Beta 2 microglobulin was 10.3 (0.6 - 2.4). LDH was 102. TSH was 3.673.  Hepatitis serologies were negative on 11/28/2019.  24 hour UPEP on 10/30/2019 revealed 1020 mg/24 hours protein with 65.3% M-spike (665.9 mg/24 hours).  Urine free kappa light chain were 2,620.44, free lambda light chain 6.01 and ratio 436.01 (1.03-31.76).  Bone survey on 10/26/2019 revealed small lytic lesions in the skull. There was no acute fracture.  SPEP has been followed (gm/dL):  5.7 on 10/26/2019 and 3.5 on 12/27/2019.  She is day 1 of cycle #2 daratumumab SQ and Decadron (11/29/2019 - 12/27/2019).  She began monthly Xgeva on 12/05/2019.  She has B12 deficiency. Vitamin B12 was 200 (  low) and folate >20.0 on 10/03/2019.  She was on weekly B12 injections x 3 and is now on monthly B12 injections (last 12/20/2019).  Antiparietal antibody and intrinsic factor antibody were negative on 12/05/2019.  She is a Restaurant manager, fast food.  She receives Retacrit (last 12/13/2019).  Ferritin was 87 on 12/20/2019.  She received Venofer on (12/05/2019; last 12/13/2019).  She received both doses of the COVID-19 vaccine in 08/2019.  Symptomatically, her energy level has improved.  She denies any B symptoms.  Exam is stable.  Plan: 1.   Labs today: CBC with diff, CMP, SPEP, Mg. 2.   Stage III IgA multiple myeloma             Patient presented with a 5.7 gm/dL biclonal IgA gammopathy with kappa specificity.             Kappa free light chains were 1,376.3 with a ratio > 917.53 (0.26-1.65).               Beta2 microglobulin was 10.3 (0.6 - 2.4).              Bone survey revealed lytic lesions in skull.             Bone marrow biopsy revealed 70% plasma cells.  Cytogenetics are complex.             Current plan [address] (cycle length 28 days).                         Revlimid held with cycle #1     Revlimid 10 mg po day 1-21 beginning cycle #2.                         Daratumumab SQ 1800 mg on  days 1, 8, 15, and 22 for cycles #1 and #2.                         Decadron 20 mg po days 1-2, 8-9, 15-16, and 22-23.                         Cycles #3-6 and 7 and beyond as previously stated.             Prophylaxis:                         Singulair 10 mg po the day before, the day of and 2 days after daratumumab.                         Acyclovir 400 mg po BID.                         Aspirin 81 mg a day when Revlimid initiated.             Supportive care:                         She began Xgeva on 12/05/2019.   Labs reviewed.  Begin cycle #2 [address].   Daratumumab SQ day 1 today.     Discuss initiation of Revlimid.  Pills right in to clinic and reviewed.   Several questions asked and answered.  Discuss plans for weekly visits during cycle #2 and if tolerated  well, MD visits every 2-4 weeks.    Patient feels comfortable with this plan.  Discuss symptom management.  She has antiemetics and pain medications at home to use on a prn bases.  Interventions are adequate.    3.   Anemia             Hematocrit 31.8.  Hemoglobin 10.5.  MCV 110.4 on 12/27/2019.             Etiology is secondary to myeloma.             Patient unable to receive PRBCs.             Patient receives aggressive supportive care with Venofer and Retacrit.                         Ferritin goal 100-400.                         Hemoglobin goal 11.             Patient last received Retacrit and Venofer on 12/13/2019. 4.   B12 deficiency             B12 was 200 (low) on 10/03/2019.             Diet is modest.             She receives monthly B12 injections (last 12/20/2019).             No evidence for pernicious anemia. 5.   Day 1 of cycle #2 daratumumab SQ today. 6.   RTC in 1 week for MD assessment, labs (CBC with diff, CMP, Mg, FLCA), day 8 of cycle #2 daratumumab, and +/- Retacrit.  I discussed the assessment and treatment plan with the patient.  The patient was provided an opportunity to ask questions and all  were answered.  The patient agreed with the plan and demonstrated an understanding of the instructions.  The patient was advised to call back if the symptoms worsen or if the condition fails to improve as anticipated.   Aine Strycharz C. Mike Gip, MD, PhD    12/27/2019, 9:21 AM  I, Mirian Mo Tufford, am acting as a Education administrator for [employer]. Mike Gip, MD.   I, Attila Mccarthy C. Mike Gip, MD, have reviewed the above documentation for accuracy and completeness, and I agree with the above.

## 2019-12-27 ENCOUNTER — Encounter: Payer: Self-pay | Admitting: Hematology and Oncology

## 2019-12-27 ENCOUNTER — Inpatient Hospital Stay: Payer: Medicare HMO

## 2019-12-27 ENCOUNTER — Inpatient Hospital Stay: Payer: Medicare HMO | Admitting: Hematology and Oncology

## 2019-12-27 ENCOUNTER — Other Ambulatory Visit: Payer: Self-pay

## 2019-12-27 VITALS — BP 146/76 | HR 108 | Temp 97.3°F | Resp 18 | Ht 65.0 in | Wt 211.9 lb

## 2019-12-27 VITALS — BP 131/76 | HR 96 | Temp 97.7°F | Resp 20

## 2019-12-27 DIAGNOSIS — C9000 Multiple myeloma not having achieved remission: Secondary | ICD-10-CM | POA: Diagnosis not present

## 2019-12-27 DIAGNOSIS — Z5112 Encounter for antineoplastic immunotherapy: Secondary | ICD-10-CM | POA: Diagnosis not present

## 2019-12-27 DIAGNOSIS — D539 Nutritional anemia, unspecified: Secondary | ICD-10-CM

## 2019-12-27 DIAGNOSIS — E538 Deficiency of other specified B group vitamins: Secondary | ICD-10-CM

## 2019-12-27 LAB — CBC WITH DIFFERENTIAL/PLATELET
Abs Immature Granulocytes: 0.02 10*3/uL (ref 0.00–0.07)
Basophils Absolute: 0 10*3/uL (ref 0.0–0.1)
Basophils Relative: 0 %
Eosinophils Absolute: 0.1 10*3/uL (ref 0.0–0.5)
Eosinophils Relative: 2 %
HCT: 31.8 % — ABNORMAL LOW (ref 36.0–46.0)
Hemoglobin: 10.5 g/dL — ABNORMAL LOW (ref 12.0–15.0)
Immature Granulocytes: 1 %
Lymphocytes Relative: 25 %
Lymphs Abs: 0.8 10*3/uL (ref 0.7–4.0)
MCH: 36.5 pg — ABNORMAL HIGH (ref 26.0–34.0)
MCHC: 33 g/dL (ref 30.0–36.0)
MCV: 110.4 fL — ABNORMAL HIGH (ref 80.0–100.0)
Monocytes Absolute: 0.2 10*3/uL (ref 0.1–1.0)
Monocytes Relative: 5 %
Neutro Abs: 2.3 10*3/uL (ref 1.7–7.7)
Neutrophils Relative %: 67 %
Platelets: 265 10*3/uL (ref 150–400)
RBC: 2.88 MIL/uL — ABNORMAL LOW (ref 3.87–5.11)
RDW: 14.4 % (ref 11.5–15.5)
WBC: 3.4 10*3/uL — ABNORMAL LOW (ref 4.0–10.5)
nRBC: 0 % (ref 0.0–0.2)

## 2019-12-27 LAB — COMPREHENSIVE METABOLIC PANEL
ALT: 14 U/L (ref 0–44)
AST: 16 U/L (ref 15–41)
Albumin: 3.2 g/dL — ABNORMAL LOW (ref 3.5–5.0)
Alkaline Phosphatase: 54 U/L (ref 38–126)
Anion gap: 9 (ref 5–15)
BUN: 22 mg/dL (ref 8–23)
CO2: 25 mmol/L (ref 22–32)
Calcium: 8.4 mg/dL — ABNORMAL LOW (ref 8.9–10.3)
Chloride: 102 mmol/L (ref 98–111)
Creatinine, Ser: 1.03 mg/dL — ABNORMAL HIGH (ref 0.44–1.00)
GFR calc Af Amer: >60 mL/min (ref >60)
GFR calc non Af Amer: 54 mL/min — ABNORMAL LOW (ref >60)
Glucose, Bld: 111 mg/dL — ABNORMAL HIGH (ref 70–99)
Potassium: 4.2 mmol/L (ref 3.5–5.1)
Sodium: 136 mmol/L (ref 135–145)
Total Bilirubin: 0.6 mg/dL (ref 0.3–1.2)
Total Protein: 9.6 g/dL — ABNORMAL HIGH (ref 6.5–8.1)

## 2019-12-27 LAB — MAGNESIUM: Magnesium: 2 mg/dL (ref 1.7–2.4)

## 2019-12-27 MED ORDER — ACETAMINOPHEN 325 MG PO TABS
650.0000 mg | ORAL_TABLET | Freq: Once | ORAL | Status: AC
  Administered 2019-12-27: 650 mg via ORAL
  Filled 2019-12-27: qty 2

## 2019-12-27 MED ORDER — ONDANSETRON HCL 4 MG PO TABS
8.0000 mg | ORAL_TABLET | Freq: Once | ORAL | Status: AC
  Administered 2019-12-27: 8 mg via ORAL
  Filled 2019-12-27: qty 2

## 2019-12-27 MED ORDER — DIPHENHYDRAMINE HCL 25 MG PO CAPS
50.0000 mg | ORAL_CAPSULE | Freq: Once | ORAL | Status: AC
  Administered 2019-12-27: 50 mg via ORAL
  Filled 2019-12-27: qty 2

## 2019-12-27 MED ORDER — DEXAMETHASONE 4 MG PO TABS
20.0000 mg | ORAL_TABLET | Freq: Once | ORAL | Status: AC
  Administered 2019-12-27: 20 mg via ORAL
  Filled 2019-12-27: qty 5

## 2019-12-27 MED ORDER — DARATUMUMAB-HYALURONIDASE-FIHJ 1800-30000 MG-UT/15ML ~~LOC~~ SOLN
1800.0000 mg | Freq: Once | SUBCUTANEOUS | Status: AC
  Administered 2019-12-27: 1800 mg via SUBCUTANEOUS
  Filled 2019-12-27: qty 15

## 2019-12-27 NOTE — Progress Notes (Signed)
No new changes noted today 

## 2019-12-28 LAB — PROTEIN ELECTROPHORESIS, SERUM
A/G Ratio: 0.6 — ABNORMAL LOW (ref 0.7–1.7)
Albumin ELP: 3.6 g/dL (ref 2.9–4.4)
Alpha-1-Globulin: 0.2 g/dL (ref 0.0–0.4)
Alpha-2-Globulin: 0.6 g/dL (ref 0.4–1.0)
Beta Globulin: 0.8 g/dL (ref 0.7–1.3)
Gamma Globulin: 4.2 g/dL — ABNORMAL HIGH (ref 0.4–1.8)
Globulin, Total: 5.9 g/dL — ABNORMAL HIGH (ref 2.2–3.9)
M-Spike, %: 3.5 g/dL — ABNORMAL HIGH
Total Protein ELP: 9.5 g/dL — ABNORMAL HIGH (ref 6.0–8.5)

## 2020-01-02 NOTE — Progress Notes (Signed)
[facility]  [address] [place],  [address] Phone: [phone]  Fax: [phone]   Clinic Day:  01/03/2020  Referring physician: Denton Lank, MD  Chief Complaint: Laurie [name] is a 74 y.o. female with multiple myeloma who is seen for assessment prior to day 8 of cycle #2 daratumumab SQ, Revlimid, and Decadron.  HPI: The patient was last seen in the hematology clinic on 12/27/2019. At that time, she felt "ok". Energy level had improved.  Hematocrit was 31.8, hemoglobin 10.5, MCV 110.4, platelets 265,000, WBC 3,400 (ANC 2,300). Creatinine was 1.03 (CrCl 54 ml/min). Calcium was 8.4. Total protein was 9.6. Albumin was 3.2. Magnesium was 2.0. M-spike was 3.5 g/dL (improved). She received day 1 of cycle #2 daratumumab SQ.  Revlimid was started.  During the interim, she has been "ok".  She had a bad day yesterday and had some back pain and nausea. She took Zofran and felt better. She has been able to eat and drink. She thinks that her symptoms may have been due to Revlimid, but she would like to stay on it because "it wasn't that bad." Today, she feels much better. Her asthma is stable. She has not had any more episodes of blood in her stool.  She takes acyclovir, Singulair, and Decadron as prescribed. She takes baby aspirin with Revlimid.   Past Medical History:  Diagnosis Date  . Anxiety   . Asthma   . Depression   . Head injury   . Osteoporosis     Past Surgical History:  Procedure Laterality Date  . ABDOMINAL HYSTERECTOMY    . HEMORROIDECTOMY    . SEPTOPLASTY      Family History  Problem Relation Age of Onset  . Breast cancer Cousin        mat cousin    Social History:  reports that she has never smoked. She has never used smokeless tobacco. She reports that she does not drink alcohol and does not use drugs.  She denies any exposure to radiation or toxins. She denies alcohol and tobacco use. Her husband has schizophrenia and  committed suicide. The patient is retired and has not worked since 90. 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  She then worked in the [employer] x 5 years until disability at age 37.  She lives in [place], [place]. She has a Neurosurgeon named Phoebe.  She is a Sales promotion account executive [employer], and will not accept blood products.  The patient is alone today.   Allergies:  Allergies  Allergen Reactions  . Levofloxacin     Critical   . Other Other (See Comments)    Antihistamines    Current Medications: Current Outpatient Medications  Medication Sig Dispense Refill  . acyclovir (ZOVIRAX) 400 MG tablet Take 1 tablet (400 mg total) by mouth 2 (two) times daily. 60 tablet 11  . albuterol (VENTOLIN HFA) 108 (90 Base) MCG/ACT inhaler 2 inhalations every 4 (four) hours as needed.    Marland Kitchen alendronate (FOSAMAX) 70 MG tablet     . aspirin 81 MG chewable tablet Chew by mouth daily.    Marland Kitchen atorvastatin (LIPITOR) 20 MG tablet Take by mouth.    . busPIRone (BUSPAR) 10 MG tablet Take 10 mg by mouth 2 (two) times daily.    Marland Kitchen dexamethasone (DECADRON) 4 MG tablet Take 5 tablets (20 mg total) by mouth once a week. Take 5 tablets (30m) weekly the day  after daratumumab for 8 weeks. Take with breakfast. 40 tablet 0  . docusate sodium (COLACE) 100 MG capsule Take 100 mg by mouth daily.    . fluticasone-salmeterol (ADVAIR HFA) 230-21 MCG/ACT inhaler Inhale into the lungs.    . montelukast (SINGULAIR) 10 MG tablet Take 10 mg by mouth at bedtime.    . naproxen (NAPROSYN) 500 MG tablet Take 500 mg by mouth 2 (two) times daily with a meal.    . nortriptyline (PAMELOR) 25 MG capsule Take by mouth.    Marland Kitchen omeprazole (PRILOSEC) 20 MG capsule Take 20 mg by mouth daily.    . sertraline (ZOLOFT) 100 MG tablet Take 200 mg by mouth daily.     . traZODone (DESYREL) 100 MG tablet Take 25 mg by mouth at bedtime.     . Vitamin D, Ergocalciferol, (DRISDOL) 50000 UNITS CAPS capsule Take 50,000 Units  by mouth every 7 (seven) days.    . ondansetron (ZOFRAN) 8 MG tablet Take 1 tablet (8 mg total) by mouth every 8 (eight) hours as needed (Nausea or vomiting). (Patient not taking: Reported on 11/28/2019) 30 tablet 1   No current facility-administered medications for this visit.    Review of Systems  Constitutional: Positive for weight loss (2 lbs). Negative for chills, diaphoresis, fever and malaise/fatigue.       Feels "okay."  HENT: Negative.  Negative for congestion, ear discharge, ear pain, hearing loss, nosebleeds, sinus pain, sore throat and tinnitus.   Eyes: Negative.  Negative for blurred vision.  Respiratory: Negative for cough, hemoptysis, sputum production and shortness of breath (asthma/heat related).        Asthma.  Cardiovascular: Negative.  Negative for chest pain, palpitations and leg swelling.  Gastrointestinal: Positive for nausea. Negative for abdominal pain, blood in stool, constipation (on stool softener), diarrhea, heartburn (on medication), melena and vomiting.  Genitourinary: Negative.  Negative for dysuria, frequency, hematuria and urgency.  Musculoskeletal: Positive for back pain. Negative for falls, joint pain, myalgias and neck pain.  Skin: Negative.  Negative for itching and rash.  Neurological: Negative for dizziness, tingling, sensory change, weakness and headaches.       Occasional balance problems. Traumatic brain injury (fall) 3 years ago.  Endo/Heme/Allergies: Negative.  Does not bruise/bleed easily.  Psychiatric/Behavioral: Negative.  Negative for depression and memory loss. The patient is not nervous/anxious and does not have insomnia.   All other systems reviewed and are negative.  Performance status (ECOG): 1  Vitals Blood pressure 130/71, pulse (!) 112, temperature (!) 97 F (36.1 C), temperature source Tympanic, resp. rate 18, weight 209 lb 7 oz (95 kg), SpO2 97 %.   Physical Exam Vitals and nursing note reviewed.  Constitutional:      General:  She is not in acute distress.    Appearance: Normal appearance. She is not diaphoretic.  HENT:     Head: Normocephalic and atraumatic.     Comments: Shoulder length gray hair.  Mask.    Ears:     Comments: Glasses.  Blue eyes.    Mouth/Throat:     Mouth: Mucous membranes are moist.     Pharynx: Oropharynx is clear.  Eyes:     General: No scleral icterus.    Extraocular Movements: Extraocular movements intact.     Conjunctiva/sclera: Conjunctivae normal.     Pupils: Pupils are equal, round, and reactive to light.  Cardiovascular:     Rate and Rhythm: Normal rate and regular rhythm.     Heart sounds: Normal heart  sounds. No murmur heard.   Pulmonary:     Effort: Pulmonary effort is normal. No respiratory distress.     Breath sounds: Normal breath sounds. No wheezing or rales.  Chest:     Chest wall: No tenderness.  Abdominal:     General: Bowel sounds are normal. There is no distension.     Palpations: Abdomen is soft. There is no hepatomegaly, splenomegaly or mass.     Tenderness: There is no abdominal tenderness. There is no guarding or rebound.  Musculoskeletal:        General: No swelling or tenderness. Normal range of motion.     Cervical back: Normal range of motion and neck supple.  Lymphadenopathy:     Head:     Right side of head: No preauricular, posterior auricular or occipital adenopathy.     Left side of head: No preauricular, posterior auricular or occipital adenopathy.     Cervical: No cervical adenopathy.     Upper Body:     Right upper body: No supraclavicular or axillary adenopathy.     Left upper body: No supraclavicular or axillary adenopathy.     Lower Body: No right inguinal adenopathy. No left inguinal adenopathy.  Skin:    General: Skin is warm and dry.  Neurological:     Mental Status: She is alert and oriented to person, place, and time. Mental status is at baseline.  Psychiatric:        Mood and Affect: Affect normal.        Behavior: Behavior  normal.        Thought Content: Thought content normal.        Judgment: Judgment normal.    No visits with results within 3 Day(s) from this visit.  Latest known visit with results is:  Hospital Outpatient Visit on 11/13/2019  Component Date Value Ref Range Status  . WBC 11/13/2019 3.0* 4.0 - 10.5 K/uL Final  . RBC 11/13/2019 2.40* 3.87 - 5.11 MIL/uL Final  . Hemoglobin 11/13/2019 8.7* 12.0 - 15.0 g/dL Final  . HCT 11/13/2019 26.3* 36 - 46 % Final  . MCV 11/13/2019 109.6* 80.0 - 100.0 fL Final  . MCH 11/13/2019 36.3* 26.0 - 34.0 pg Final  . MCHC 11/13/2019 33.1  30.0 - 36.0 g/dL Final  . RDW 11/13/2019 14.0  11.5 - 15.5 % Final  . Platelets 11/13/2019 239  150 - 400 K/uL Final  . nRBC 11/13/2019 0.0  0.0 - 0.2 % Final  . Neutrophils Relative % 11/13/2019 66  % Final  . Neutro Abs 11/13/2019 2.0  1.7 - 7.7 K/uL Final  . Lymphocytes Relative 11/13/2019 23  % Final  . Lymphs Abs 11/13/2019 0.7  0.7 - 4.0 K/uL Final  . Monocytes Relative 11/13/2019 7  % Final  . Monocytes Absolute 11/13/2019 0.2  0 - 1 K/uL Final  . Eosinophils Relative 11/13/2019 4  % Final  . Eosinophils Absolute 11/13/2019 0.1  0 - 0 K/uL Final  . Basophils Relative 11/13/2019 0  % Final  . Basophils Absolute 11/13/2019 0.0  0 - 0 K/uL Final  . Immature Granulocytes 11/13/2019 0  % Final  . Abs Immature Granulocytes 11/13/2019 0.01  0.00 - 0.07 K/uL Final   Performed at [facility], [address] [place], [place] [address]  . Prothrombin Time 11/13/2019 16.0* 11.4 - 15.2 seconds Final  . INR 11/13/2019 1.3* 0.8 - 1.2 Final   Comment: (NOTE) INR goal varies based on device and disease  states. Performed at [facility], [address], [place], [place] [address]   . SURGICAL PATHOLOGY 11/13/2019    Final-Edited                   Value:SURGICAL PATHOLOGY CASE: [id number] PATIENT: Madora Wojtkiewicz Bone Marrow Report  Clinical History: Monoclonal gammopathy, right ilium,  (ADC)  DIAGNOSIS:  BONE MARROW, ASPIRATE, CLOT, CORE: -Hypercellular bone marrow with extensive involvement by plasma cell neoplasm -See comment  PERIPHERAL BLOOD: -Macrocytic anemia -Leukopenia  COMMENT:  The bone marrow shows extensive infiltration by atypical plasma cells representing 70% of all cells in the aspirate associated with prominent interstitial infiltrates and diffuse sheets in the clot and biopsy sections.  The plasma cells display kappa light chain restriction consistent with plasma cell neoplasm.  Correlation with cytogenetic and FISH studies is recommended.  MICROSCOPIC DESCRIPTION:  PERIPHERAL BLOOD SMEAR: The red blood cells display mild anisopoikilocytosis with mild polychromasia.  Prominent rouleaux formation is seen.  The white blood cells are decreased in number with no significant mor                         phologic abnormalities.  The platelets are normal in number.  BONE MARROW ASPIRATE: Bone marrow particles present Erythroid precursors: Progressive maturation with scattered maturing precursors displaying nuclear cytoplasmic dyssynchrony Granulocytic precursors: Orderly and progressive maturation Megakaryocytes: Present with predominantly normal morphology Lymphocytes/plasma cells: The plasma cells are markedly increased in number representing 70% of all cells associated with atypical cytomorphologic features characterized by cytomegaly and/or small nucleoli significant lymphoid aggregates are not present.  TOUCH PREPARATIONS: Mixture of cell types with prominent increase in atypical plasma cells  CLOT AND BIOPSY: The sections show 70 to 90% cellularity with interstitial infiltrates and diffuse sheets of atypical plasma cells characterized by partially clumped to vesicular chromatin and prominent nucleoli.  Residual trilineage hematopoiesis is variably present in the                          background. Immunohistochemical stain for CD138  and in situ hybridization for kappa and lambda were performed on blocks B1 and C1 with appropriate controls.  CD138 highlights the extensive plasma cell component in the bone marrow which shows kappa light chain restriction.   IRON STAIN: Iron stains are performed on a bone marrow aspirate or touch imprint smear and section of clot. The controls stained appropriately.       Storage Iron: Decreased      Ring Sideroblasts: Absent  ADDITIONAL DATA/TESTING: The specimen was sent for cytogenetic analysis and FISH for multiple myeloma and a separate report will follow  CELL COUNT DATA:  Bone Marrow count performed on 500 cells shows: Blasts:   0%   Myeloid:  17% Promyelocytes: 0%   Erythroid:     11% Myelocytes:    1%   Lymphocytes:   2% Metamyelocytes:     1%   Plasma cells:  70% Bands:    3% Neutrophils:   11%  M:E ratio:     1.5 Eosinophils:   1% Basophils:     0% Monocytes:     0%  Lab Data: CBC performed on                          11/13/19 shows: WBC: 3.0 k/uL  Neutrophils:   67% Hgb: 8.7 g/dL  Lymphocytes:   21% HCT: 26.3 %  Monocytes:     6% MCV: 109.6 fL  Eosinophils:   5% RDW: 14 % Basophils:     1% PLT: 239 k/uL  GROSS DESCRIPTION:  A: Aspirate smear  B: Received in B-plus fixative are tissue fragments measuring 0.4 x 0.3 x 0.1 cm in aggregate.  The specimen is submitted in toto.  C: Received in B-plus fixative is a 1.4 x 0.2 cm core of bone which is submitted in toto following decalcification.  [facility] 11/13/2019)  Final Diagnosis performed by Susanne Greenhouse, MD.   Electronically signed 11/15/2019 Technical and / or Professional components performed at [facility], [place] [address] [place], [place] [address].  Immunohistochemistry Technical component (if applicable) was performed at [facility]. [address], [place], [place],  [address].   IMMUNOHISTOCHEMISTRY DISCLAIMER (if applicable): Some of these  immunohistochemical stains may hav                         e been developed and the performance characteristics determine by [facility]. Some may not have been cleared or approved by the U.S. Food and Drug Administration. The FDA has determined that such clearance or approval is not necessary. This test is used for clinical purposes. It should not be regarded as investigational or for research. This laboratory is certified under the [place] (CLIA-88) as qualified to perform high complexity clinical laboratory testing.  The controls stained appropriately.    Assessment:  TERRIL AMARO is a 74 y.o. female with stage III biclonal IgA multiple myeloma.  SPEP was 5.0 gm/dL on 10/03/2019. Random urine revealed 196.6 mg/dL total protein with 58.3% M-spike.    Bone marrow biopsy on 11/13/2019 revealed a hypercellular bone marrow with extensive involvement by plasma cell neoplasm. Atypical plasma cells represented 70% of all cells in the aspirate and was associated with prominent interstitial infiltrates and diffuse sheets in the clot.  Plasma cells were kappa light chain restricted.   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     Labs on 10/03/2019 revealed a hematocrit 27.9, hemoglobin 9.4, MCV 108, platelets 287,000, WBC 3,800 (ANC 2400).   Creatinine was 0.94, albumin 3.4, and protein 11.2.  Ferritin was 82 with an iron saturation of 30% and a TIBC of 206.  Hepatitis C antibody and HIV testing were negative.    Work-up on 10/26/2019 revealed a hematocrit was 27.4, hemoglobin 9.1, MCV 109.6, platelets 250,000, WBC 3,400 (ANC 2,100).  Retic was 2.2%.  Creatinine was 1.06 and  calcium 8.6. Total protein was 11.4 and albumin 3.2.  M-spike was 5.7 gm/dL biclonal IgA protein with kappa specificity.  Kappa free light chain were 1,376.3, lambda free light chains <1.5, and ratio > 917.53 (0.26-1.65).  Beta 2 microglobulin was 10.3 (0.6 - 2.4). LDH was 102. TSH was 3.673.  Hepatitis serologies were negative on 11/28/2019.  24 hour UPEP on 10/30/2019 revealed 1020 mg/24 hours protein with 65.3% M-spike (665.9 mg/24 hours).  Urine free kappa light chain were 2,620.44, free lambda light chain 6.01 and ratio 436.01 (1.03-31.76).  Bone survey on 10/26/2019 revealed small lytic lesions in the skull. There was no acute fracture.  SPEP has been followed (gm/dL): 5.7 on  10/26/2019 and 3.5 on 12/27/2019. Kappa free light chains have been followed (mg/L): 1376.3 on 10/26/2019.  She is day 8 of cycle #2 daratumumab SQ and Decadron (11/29/2019 - 12/27/2019).  She began monthly Xgeva on 12/05/2019.  She has B12 deficiency. Vitamin B12 was 200 (low) and folate >20.0 on 10/03/2019.  She was on weekly B12 injections x 3 and is now on monthly B12 injections (last 12/20/2019).  Antiparietal antibody and intrinsic factor antibodies were negative on 12/05/2019.  She is a Restaurant manager, fast food.  She receives Retacrit (last 12/13/2019) and IV iron support.  Ferritin was 59 on 11/27/2019.  She received Venofer on 12/05/2019 and 12/13/2019.  She received both doses of the COVID-19 vaccine in 08/2019.  Symptomatically, she is doing well.  She has had some nausea.  Exam is stable.  Plan: 1.   Labs today: CBC with diff, CMP, Mg. 2.   Stage III multiple myeloma             Patient was diagnosed with a 5.7 gm/dL biclonal IgA gammopathy with kappa specificity.   M-spike was 3.5 on 12/27/2019.             Kappa free light chains were 1,376.3 with a ratio > 917.53 (0.26-1.65).               Beta2 microglobulin was 10.3 (0.6 - 2.4).              Bone survey reveals lytic lesions in skull.             Bone  marrow biopsy revealed 70% plasma cells.                         Cytogenetics are complex.             She receives [address] (cycle length 28 days).                         Daratumumab SQ 1800 mg on days 1, 8, 15, and 22 for cycles #1 and #2.                         Revlimid 10 mg po day 1-21 cycle #2.                         Decadron 20 mg po days 1-2, 8-9, 15-16, and 22-23.                         Cycles #3-6 and 7 and beyond as previously stated.             Prophylaxis:                         Singulair 10 mg po the day before, the day of and 2 days after daratumumab.                         Acyclovir 400 mg po BID.                         Aspirin 81 mg a day when Revlimid initiated.             Supportive care:  She began [place] on 12/05/2019.   Xgeva today.  Labs reviewed.  Begin day 8 of cycle #2 [address].     Continue Revlimid.  Discuss symptom management.  She has antiemetics at home to use on a prn bases.  Interventions are adequate.        3.   Anemia             Hematocrit 33.7.  Hemoglobin 11.2.  MCV 110.1.             Etiology is secondary to myeloma.             She is unable to receive PRBCs.             She continues aggressive supportive care with Venofer and Retacrit as needed.                         Ferritin goal 100-400.                         Hemoglobin goal 11.             Continue to monitor closely. 4.   B12 deficiency             B12 was 200 (low) on 10/03/2019.             Diet is modest.             She receives monthly B12 injections (last 12/20/2019). 5.   Xgeva today. 6.   No Retacrit today. 7.   Day 8 of cycle #2 daratumumab SQ today. 8.   RTC in 1 week for MD assessment, labs (CBC with diff, CMP, Mg, ferritin, beta2 microglobulin), day 15 of cycle #2 daratumumab, and +/- Retacrit.  I discussed the assessment and treatment plan with the patient.  The patient was provided an opportunity to ask questions and all were answered.  The  patient agreed with the plan and demonstrated an understanding of the instructions.  The patient was advised to call back if the symptoms worsen or if the condition fails to improve as anticipated.   Parveen Freehling C. Mike Gip, MD, PhD    01/03/2020, 1:34 PM  I, Mirian Mo Tufford, am acting as a Education administrator for [employer]. Mike Gip, MD.   I, Chanika Byland C. Mike Gip, MD, have reviewed the above documentation for accuracy and completeness, and I agree with the above.

## 2020-01-03 ENCOUNTER — Inpatient Hospital Stay: Payer: Medicare HMO | Attending: Hematology and Oncology

## 2020-01-03 ENCOUNTER — Encounter: Payer: Self-pay | Admitting: Hematology and Oncology

## 2020-01-03 ENCOUNTER — Other Ambulatory Visit: Payer: Self-pay | Admitting: Hematology and Oncology

## 2020-01-03 ENCOUNTER — Inpatient Hospital Stay: Payer: Medicare HMO

## 2020-01-03 ENCOUNTER — Inpatient Hospital Stay (HOSPITAL_BASED_OUTPATIENT_CLINIC_OR_DEPARTMENT_OTHER): Payer: Medicare HMO | Admitting: Hematology and Oncology

## 2020-01-03 ENCOUNTER — Other Ambulatory Visit: Payer: Self-pay

## 2020-01-03 VITALS — BP 130/71 | HR 112 | Temp 97.0°F | Resp 18 | Wt 209.4 lb

## 2020-01-03 VITALS — BP 136/54 | HR 106 | Temp 97.6°F | Resp 18

## 2020-01-03 DIAGNOSIS — F329 Major depressive disorder, single episode, unspecified: Secondary | ICD-10-CM | POA: Insufficient documentation

## 2020-01-03 DIAGNOSIS — Z7982 Long term (current) use of aspirin: Secondary | ICD-10-CM | POA: Insufficient documentation

## 2020-01-03 DIAGNOSIS — D649 Anemia, unspecified: Secondary | ICD-10-CM | POA: Insufficient documentation

## 2020-01-03 DIAGNOSIS — E538 Deficiency of other specified B group vitamins: Secondary | ICD-10-CM | POA: Diagnosis not present

## 2020-01-03 DIAGNOSIS — D539 Nutritional anemia, unspecified: Secondary | ICD-10-CM | POA: Diagnosis not present

## 2020-01-03 DIAGNOSIS — F419 Anxiety disorder, unspecified: Secondary | ICD-10-CM | POA: Insufficient documentation

## 2020-01-03 DIAGNOSIS — Z5112 Encounter for antineoplastic immunotherapy: Secondary | ICD-10-CM

## 2020-01-03 DIAGNOSIS — Z90710 Acquired absence of both cervix and uterus: Secondary | ICD-10-CM | POA: Insufficient documentation

## 2020-01-03 DIAGNOSIS — Z791 Long term (current) use of non-steroidal anti-inflammatories (NSAID): Secondary | ICD-10-CM | POA: Insufficient documentation

## 2020-01-03 DIAGNOSIS — J45909 Unspecified asthma, uncomplicated: Secondary | ICD-10-CM | POA: Diagnosis not present

## 2020-01-03 DIAGNOSIS — Z7951 Long term (current) use of inhaled steroids: Secondary | ICD-10-CM | POA: Insufficient documentation

## 2020-01-03 DIAGNOSIS — C9000 Multiple myeloma not having achieved remission: Secondary | ICD-10-CM | POA: Insufficient documentation

## 2020-01-03 DIAGNOSIS — Z7189 Other specified counseling: Secondary | ICD-10-CM

## 2020-01-03 DIAGNOSIS — Z7952 Long term (current) use of systemic steroids: Secondary | ICD-10-CM | POA: Diagnosis not present

## 2020-01-03 DIAGNOSIS — Z79899 Other long term (current) drug therapy: Secondary | ICD-10-CM | POA: Insufficient documentation

## 2020-01-03 DIAGNOSIS — Z803 Family history of malignant neoplasm of breast: Secondary | ICD-10-CM | POA: Diagnosis not present

## 2020-01-03 LAB — CBC WITH DIFFERENTIAL/PLATELET
Abs Immature Granulocytes: 0.01 10*3/uL (ref 0.00–0.07)
Basophils Absolute: 0 10*3/uL (ref 0.0–0.1)
Basophils Relative: 0 %
Eosinophils Absolute: 0.2 10*3/uL (ref 0.0–0.5)
Eosinophils Relative: 4 %
HCT: 33.7 % — ABNORMAL LOW (ref 36.0–46.0)
Hemoglobin: 11.2 g/dL — ABNORMAL LOW (ref 12.0–15.0)
Immature Granulocytes: 0 %
Lymphocytes Relative: 13 %
Lymphs Abs: 0.6 10*3/uL — ABNORMAL LOW (ref 0.7–4.0)
MCH: 36.6 pg — ABNORMAL HIGH (ref 26.0–34.0)
MCHC: 33.2 g/dL (ref 30.0–36.0)
MCV: 110.1 fL — ABNORMAL HIGH (ref 80.0–100.0)
Monocytes Absolute: 0.2 10*3/uL (ref 0.1–1.0)
Monocytes Relative: 3 %
Neutro Abs: 3.7 10*3/uL (ref 1.7–7.7)
Neutrophils Relative %: 80 %
Platelets: 344 10*3/uL (ref 150–400)
RBC: 3.06 MIL/uL — ABNORMAL LOW (ref 3.87–5.11)
RDW: 14.3 % (ref 11.5–15.5)
WBC: 4.6 10*3/uL (ref 4.0–10.5)
nRBC: 0 % (ref 0.0–0.2)

## 2020-01-03 LAB — COMPREHENSIVE METABOLIC PANEL
ALT: 18 U/L (ref 0–44)
AST: 15 U/L (ref 15–41)
Albumin: 3.4 g/dL — ABNORMAL LOW (ref 3.5–5.0)
Alkaline Phosphatase: 55 U/L (ref 38–126)
Anion gap: 11 (ref 5–15)
BUN: 21 mg/dL (ref 8–23)
CO2: 25 mmol/L (ref 22–32)
Calcium: 8.9 mg/dL (ref 8.9–10.3)
Chloride: 100 mmol/L (ref 98–111)
Creatinine, Ser: 1.13 mg/dL — ABNORMAL HIGH (ref 0.44–1.00)
GFR calc Af Amer: 56 mL/min — ABNORMAL LOW (ref >60)
GFR calc non Af Amer: 49 mL/min — ABNORMAL LOW (ref >60)
Glucose, Bld: 112 mg/dL — ABNORMAL HIGH (ref 70–99)
Potassium: 4.4 mmol/L (ref 3.5–5.1)
Sodium: 136 mmol/L (ref 135–145)
Total Bilirubin: 0.5 mg/dL (ref 0.3–1.2)
Total Protein: 9.9 g/dL — ABNORMAL HIGH (ref 6.5–8.1)

## 2020-01-03 LAB — MAGNESIUM: Magnesium: 2.1 mg/dL (ref 1.7–2.4)

## 2020-01-03 MED ORDER — DENOSUMAB 120 MG/1.7ML ~~LOC~~ SOLN
120.0000 mg | Freq: Once | SUBCUTANEOUS | Status: AC
  Administered 2020-01-03: 120 mg via SUBCUTANEOUS
  Filled 2020-01-03: qty 1.7

## 2020-01-03 MED ORDER — ACETAMINOPHEN 325 MG PO TABS
650.0000 mg | ORAL_TABLET | Freq: Once | ORAL | Status: AC
  Administered 2020-01-03: 650 mg via ORAL
  Filled 2020-01-03: qty 2

## 2020-01-03 MED ORDER — DIPHENHYDRAMINE HCL 25 MG PO CAPS
50.0000 mg | ORAL_CAPSULE | Freq: Once | ORAL | Status: AC
  Administered 2020-01-03: 50 mg via ORAL
  Filled 2020-01-03: qty 2

## 2020-01-03 MED ORDER — DEXAMETHASONE 4 MG PO TABS
20.0000 mg | ORAL_TABLET | Freq: Once | ORAL | Status: AC
  Administered 2020-01-03: 20 mg via ORAL
  Filled 2020-01-03: qty 5

## 2020-01-03 MED ORDER — DARATUMUMAB-HYALURONIDASE-FIHJ 1800-30000 MG-UT/15ML ~~LOC~~ SOLN
1800.0000 mg | Freq: Once | SUBCUTANEOUS | Status: AC
  Administered 2020-01-03: 1800 mg via SUBCUTANEOUS
  Filled 2020-01-03: qty 15

## 2020-01-03 MED ORDER — ONDANSETRON HCL 4 MG PO TABS
8.0000 mg | ORAL_TABLET | Freq: Once | ORAL | Status: AC
  Administered 2020-01-03: 8 mg via ORAL
  Filled 2020-01-03: qty 2

## 2020-01-03 NOTE — Progress Notes (Signed)
Patient states that she has been feeling "bad" and that she had a bad day yesterday. She is having back pain off and on. Patient wants to know about the M Spike. She had to take nausea medication twice this week. She had one time numbness in toes on left foot. Headache yesterday.

## 2020-01-04 LAB — KAPPA/LAMBDA LIGHT CHAINS
Kappa free light chain: 768 mg/L — ABNORMAL HIGH (ref 3.3–19.4)
Lambda free light chains: <1.5 mg/L — ABNORMAL LOW (ref 5.7–26.3)

## 2020-01-09 NOTE — Progress Notes (Signed)
[facility]  [address], [address] [facility] [place], [place] [address] Phone: [phone]  Fax: [phone]   Clinic Day:  01/10/2020  Referring physician: Denton Lank, MD  Chief Complaint: Laurie [name] is a 74 y.o. female with multiple myeloma who is seen for assessment prior to day 15 of cycle #2 daratumumab SQ, Revlimid, and Decadron.  HPI: The patient was last seen in the hematology clinic on 01/03/2020. At that time, she was doing well.  She had some nausea.  Exam was stable. Hematocrit was 33.7, hemoglobin 11.2, MCV 110.1, platelets 344,000, WBC 4,600. Creatinine was 1.13 (CrCl 49 ml/min). Kappa free light chains were 768.0, lambda free light chain <1.5, and ratio >512.00. She received day 8 of cycle #2 daratumumab SQ, Revlimid, and Decadron.  She received Xgeva.  During the interim, she has been fine. She notes 2 days that she has felt nauseous and had to take Zofran. She took 4 Zofran total, which worked well. All of her other symptoms are stable. Today, she feels good.  She has been taking Revlimid, Singulair, and Acyclovir as prescribed. She takes calcium.   Past Medical History:  Diagnosis Date  . Anxiety   . Asthma   . Depression   . Head injury   . Osteoporosis     Past Surgical History:  Procedure Laterality Date  . ABDOMINAL HYSTERECTOMY    . HEMORROIDECTOMY    . SEPTOPLASTY      Family History  Problem Relation Age of Onset  . Breast cancer Cousin        mat cousin    Social History:  reports that she has never smoked. She has never used smokeless tobacco. She reports that she does not drink alcohol and does not use drugs.  She denies any exposure to radiation or toxins. She denies alcohol and tobacco use. Her husband has schizophrenia and committed suicide. The patient is retired and has not worked since 35. 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  She then worked in the [employer] x 5 years until disability at age 59.  She lives in [place], [place]. She has a Neurosurgeon named Phoebe.  She is a Sales promotion account executive [employer], and will not accept blood products.  The patient is alone today.   Allergies:  Allergies  Allergen Reactions  . Levofloxacin     Critical   . Other Other (See Comments)    Antihistamines    Current Medications: Current Outpatient Medications  Medication Sig Dispense Refill  . acyclovir (ZOVIRAX) 400 MG tablet Take 1 tablet (400 mg total) by mouth 2 (two) times daily. 60 tablet 11  . albuterol (VENTOLIN HFA) 108 (90 Base) MCG/ACT inhaler 2 inhalations every 4 (four) hours as needed.    Marland Kitchen alendronate (FOSAMAX) 70 MG tablet     . aspirin 81 MG chewable tablet Chew by mouth daily.    Marland Kitchen atorvastatin (LIPITOR) 20 MG tablet Take by mouth.    . busPIRone (BUSPAR) 10 MG tablet Take 10 mg by mouth 2 (two) times daily.    Marland Kitchen dexamethasone (DECADRON) 4 MG tablet Take 5 tablets (20 mg total) by mouth once a week. Take 5 tablets ($RemoveBe'20mg'svjTzorMv$ ) weekly the day after daratumumab for 8 weeks. Take with breakfast. 40 tablet 0  . docusate sodium (COLACE) 100 MG capsule Take 100 mg by mouth daily.    . fluticasone-salmeterol (ADVAIR HFA) 230-21 MCG/ACT inhaler Inhale into the lungs.    Marland Kitchen  montelukast (SINGULAIR) 10 MG tablet Take 10 mg by mouth at bedtime.    . naproxen (NAPROSYN) 500 MG tablet Take 500 mg by mouth 2 (two) times daily with a meal.    . nortriptyline (PAMELOR) 25 MG capsule Take by mouth.    Marland Kitchen omeprazole (PRILOSEC) 20 MG capsule Take 20 mg by mouth daily.    . ondansetron (ZOFRAN) 8 MG tablet Take 1 tablet (8 mg total) by mouth every 8 (eight) hours as needed (Nausea or vomiting). 30 tablet 1  . sertraline (ZOLOFT) 100 MG tablet Take 200 mg by mouth daily.     . traZODone (DESYREL) 100 MG tablet Take 25 mg by mouth at bedtime.     . Vitamin D, Ergocalciferol, (DRISDOL) 50000 UNITS CAPS capsule Take 50,000 Units by mouth every 7 (seven) days.     No current  facility-administered medications for this visit.    Review of Systems  Constitutional: Negative for chills, diaphoresis, fever, malaise/fatigue and weight loss (up 2 lbs).       Feels good today.  HENT: Negative.  Negative for congestion, ear discharge, ear pain, hearing loss, nosebleeds, sinus pain, sore throat and tinnitus.   Eyes: Negative.  Negative for blurred vision.  Respiratory: Negative for cough, hemoptysis, sputum production and shortness of breath (asthma/heat related).        Asthma.  Cardiovascular: Negative.  Negative for chest pain, palpitations and leg swelling.  Gastrointestinal: Positive for nausea. Negative for abdominal pain, blood in stool, constipation (on stool softener), diarrhea, heartburn (on medication), melena and vomiting.  Genitourinary: Negative.  Negative for dysuria, frequency, hematuria and urgency.  Musculoskeletal: Negative for back pain, falls, joint pain, myalgias and neck pain.  Skin: Negative.  Negative for itching and rash.  Neurological: Negative for dizziness, tingling, sensory change, weakness and headaches.       Occasional balance problems. Traumatic brain injury (fall) 3 years ago.  Endo/Heme/Allergies: Negative.  Does not bruise/bleed easily.  Psychiatric/Behavioral: Negative.  Negative for depression and memory loss. The patient is not nervous/anxious and does not have insomnia.   All other systems reviewed and are negative.  Performance status (ECOG): 1  Vitals Blood pressure 138/69, pulse (!) 108, temperature (!) 96.8 F (36 C), temperature source Tympanic, resp. rate 18, weight 211 lb 10.3 oz (96 kg), SpO2 100 %.   Physical Exam Vitals and nursing note reviewed.  Constitutional:      General: She is not in acute distress.    Appearance: Normal appearance. She is not diaphoretic.  HENT:     Head: Normocephalic and atraumatic.     Comments: Shoulder length gray hair.  Mask.    Ears:     Comments: Glasses.  Blue eyes.     Mouth/Throat:     Mouth: Mucous membranes are moist.     Pharynx: Oropharynx is clear.  Eyes:     General: No scleral icterus.    Extraocular Movements: Extraocular movements intact.     Conjunctiva/sclera: Conjunctivae normal.     Pupils: Pupils are equal, round, and reactive to light.  Cardiovascular:     Rate and Rhythm: Normal rate and regular rhythm.     Heart sounds: Normal heart sounds. No murmur heard.   Pulmonary:     Effort: Pulmonary effort is normal. No respiratory distress.     Breath sounds: Normal breath sounds. No wheezing or rales.  Chest:     Chest wall: No tenderness.  Abdominal:     General: Bowel sounds  are normal. There is no distension.     Palpations: Abdomen is soft. There is no hepatomegaly, splenomegaly or mass.     Tenderness: There is no abdominal tenderness. There is no guarding or rebound.  Musculoskeletal:        General: No swelling or tenderness. Normal range of motion.     Cervical back: Normal range of motion and neck supple.  Lymphadenopathy:     Head:     Right side of head: No preauricular, posterior auricular or occipital adenopathy.     Left side of head: No preauricular, posterior auricular or occipital adenopathy.     Cervical: No cervical adenopathy.     Upper Body:     Right upper body: No supraclavicular or axillary adenopathy.     Left upper body: No supraclavicular or axillary adenopathy.     Lower Body: No right inguinal adenopathy. No left inguinal adenopathy.  Skin:    General: Skin is warm and dry.  Neurological:     Mental Status: She is alert and oriented to person, place, and time. Mental status is at baseline.  Psychiatric:        Mood and Affect: Affect normal.        Behavior: Behavior normal.        Thought Content: Thought content normal.        Judgment: Judgment normal.    No visits with results within 3 Day(s) from this visit.  Latest known visit with results is:  Hospital Outpatient Visit on 11/13/2019    Component Date Value Ref Range Status  . WBC 11/13/2019 3.0* 4.0 - 10.5 K/uL Final  . RBC 11/13/2019 2.40* 3.87 - 5.11 MIL/uL Final  . Hemoglobin 11/13/2019 8.7* 12.0 - 15.0 g/dL Final  . HCT 11/13/2019 26.3* 36 - 46 % Final  . MCV 11/13/2019 109.6* 80.0 - 100.0 fL Final  . MCH 11/13/2019 36.3* 26.0 - 34.0 pg Final  . MCHC 11/13/2019 33.1  30.0 - 36.0 g/dL Final  . RDW 11/13/2019 14.0  11.5 - 15.5 % Final  . Platelets 11/13/2019 239  150 - 400 K/uL Final  . nRBC 11/13/2019 0.0  0.0 - 0.2 % Final  . Neutrophils Relative % 11/13/2019 66  % Final  . Neutro Abs 11/13/2019 2.0  1.7 - 7.7 K/uL Final  . Lymphocytes Relative 11/13/2019 23  % Final  . Lymphs Abs 11/13/2019 0.7  0.7 - 4.0 K/uL Final  . Monocytes Relative 11/13/2019 7  % Final  . Monocytes Absolute 11/13/2019 0.2  0 - 1 K/uL Final  . Eosinophils Relative 11/13/2019 4  % Final  . Eosinophils Absolute 11/13/2019 0.1  0 - 0 K/uL Final  . Basophils Relative 11/13/2019 0  % Final  . Basophils Absolute 11/13/2019 0.0  0 - 0 K/uL Final  . Immature Granulocytes 11/13/2019 0  % Final  . Abs Immature Granulocytes 11/13/2019 0.01  0.00 - 0.07 K/uL Final   Performed at [facility], [address] [place], [place] [address]  . Prothrombin Time 11/13/2019 16.0* 11.4 - 15.2 seconds Final  . INR 11/13/2019 1.3* 0.8 - 1.2 Final   Comment: (NOTE) INR goal varies based on device and disease states. Performed at [facility], [address], [place], [place] [address]   . SURGICAL PATHOLOGY 11/13/2019    Final-Edited                   Value:SURGICAL PATHOLOGY CASE: [id number] PATIENT: Katonya Paolillo Bone Marrow  Report  Clinical History: Monoclonal gammopathy, right ilium, (ADC)  DIAGNOSIS:  BONE MARROW, ASPIRATE, CLOT, CORE: -Hypercellular bone marrow with extensive involvement by plasma cell neoplasm -See comment  PERIPHERAL BLOOD: -Macrocytic anemia -Leukopenia  COMMENT:  The bone marrow shows  extensive infiltration by atypical plasma cells representing 70% of all cells in the aspirate associated with prominent interstitial infiltrates and diffuse sheets in the clot and biopsy sections.  The plasma cells display kappa light chain restriction consistent with plasma cell neoplasm.  Correlation with cytogenetic and FISH studies is recommended.  MICROSCOPIC DESCRIPTION:  PERIPHERAL BLOOD SMEAR: The red blood cells display mild anisopoikilocytosis with mild polychromasia.  Prominent rouleaux formation is seen.  The white blood cells are decreased in number with no significant mor                         phologic abnormalities.  The platelets are normal in number.  BONE MARROW ASPIRATE: Bone marrow particles present Erythroid precursors: Progressive maturation with scattered maturing precursors displaying nuclear cytoplasmic dyssynchrony Granulocytic precursors: Orderly and progressive maturation Megakaryocytes: Present with predominantly normal morphology Lymphocytes/plasma cells: The plasma cells are markedly increased in number representing 70% of all cells associated with atypical cytomorphologic features characterized by cytomegaly and/or small nucleoli significant lymphoid aggregates are not present.  TOUCH PREPARATIONS: Mixture of cell types with prominent increase in atypical plasma cells  CLOT AND BIOPSY: The sections show 70 to 90% cellularity with interstitial infiltrates and diffuse sheets of atypical plasma cells characterized by partially clumped to vesicular chromatin and prominent nucleoli.  Residual trilineage hematopoiesis is variably present in the                          background. Immunohistochemical stain for CD138 and in situ hybridization for kappa and lambda were performed on blocks B1 and C1 with appropriate controls.  CD138 highlights the extensive plasma cell component in the bone marrow which shows kappa light chain restriction.   IRON  STAIN: Iron stains are performed on a bone marrow aspirate or touch imprint smear and section of clot. The controls stained appropriately.       Storage Iron: Decreased      Ring Sideroblasts: Absent  ADDITIONAL DATA/TESTING: The specimen was sent for cytogenetic analysis and FISH for multiple myeloma and a separate report will follow  CELL COUNT DATA:  Bone Marrow count performed on 500 cells shows: Blasts:   0%   Myeloid:  17% Promyelocytes: 0%   Erythroid:     11% Myelocytes:    1%   Lymphocytes:   2% Metamyelocytes:     1%   Plasma cells:  70% Bands:    3% Neutrophils:   11%  M:E ratio:     1.5 Eosinophils:   1% Basophils:     0% Monocytes:     0%  Lab Data: CBC performed on                          11/13/19 shows: WBC: 3.0 k/uL  Neutrophils:   67% Hgb: 8.7 g/dL  Lymphocytes:   21% HCT: 26.3 %    Monocytes:     6% MCV: 109.6 fL  Eosinophils:   5% RDW: 14 % Basophils:     1% PLT: 239 k/uL  GROSS DESCRIPTION:  A: Aspirate smear  B: Received in B-plus fixative are tissue fragments measuring 0.4 x 0.3  x 0.1 cm in aggregate.  The specimen is submitted in toto.  C: Received in B-plus fixative is a 1.4 x 0.2 cm core of bone which is submitted in toto following decalcification.  [facility] 11/13/2019)  Final Diagnosis performed by Susanne Greenhouse, MD.   Electronically signed 11/15/2019 Technical and / or Professional components performed at [facility], [place] [address], [place], [place] [address].  Immunohistochemistry Technical component (if applicable) was performed at [facility]. [address] [place], [place], [place] [address].   IMMUNOHISTOCHEMISTRY DISCLAIMER (if applicable): Some of these immunohistochemical stains may hav                         e been developed and the performance characteristics determine by [facility]. Some may not have been cleared or approved by the U.S. Food and Drug Administration. The FDA has  determined that such clearance or approval is not necessary. This test is used for clinical purposes. It should not be regarded as investigational or for research. This laboratory is certified under the [place] (CLIA-88) as qualified to perform high complexity clinical laboratory testing.  The controls stained appropriately.    Assessment:  Laurie [name] is a 74 y.o. female with stage III biclonal IgA multiple myeloma.  SPEP was 5.0 gm/dL on 10/03/2019. Random urine revealed 196.6 mg/dL total protein with 58.3% M-spike.    Bone marrow biopsy on 11/13/2019 revealed a hypercellular bone marrow with extensive involvement by plasma cell neoplasm. Atypical plasma cells represented 70% of all cells in the aspirate and was associated with prominent interstitial infiltrates and diffuse sheets in the clot.  Plasma cells were kappa light chain restricted.   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     Labs on 10/03/2019 revealed a hematocrit 27.9, hemoglobin 9.4, MCV 108, platelets 287,000, WBC 3,800 (ANC 2400).   Creatinine was 0.94, albumin 3.4, and protein 11.2.  Ferritin was 82 with an iron saturation of 30% and a TIBC of 206.  Hepatitis C antibody and HIV testing were negative.    Work-up on 10/26/2019 revealed a hematocrit was 27.4, hemoglobin 9.1, MCV 109.6, platelets 250,000, WBC 3,400 (ANC 2,100).  Retic was 2.2%.  Creatinine was 1.06 and calcium 8.6. Total protein was 11.4 and albumin 3.2.  M-spike was 5.7 gm/dL biclonal IgA protein with kappa specificity.  Kappa free light chain were 1,376.3, lambda free light chains <1.5, and ratio > 917.53 (0.26-1.65).  Beta 2 microglobulin was 10.3  (0.6 - 2.4). LDH was 102. TSH was 3.673.  Hepatitis serologies were negative on 11/28/2019.  24 hour UPEP on 10/30/2019 revealed 1020 mg/24 hours protein with 65.3% M-spike (665.9 mg/24 hours).  Urine free kappa light chain were 2,620.44, free lambda light chain 6.01 and ratio 436.01 (1.03-31.76).  Bone survey on 10/26/2019 revealed small lytic lesions in the skull. There was no acute fracture.  SPEP has been followed (gm/dL): 5.7 on 10/26/2019 and 3.5 on 12/27/2019. Kappa free light chains have been followed (mg/L): 1376.3 on 10/26/2019 and 768 on 01/03/2020.  She is day 8 of cycle #2 daratumumab SQ and Decadron (11/29/2019 - 12/27/2019).  She began monthly Xgeva on 12/05/2019 (last 01/03/2020).  She  has B12 deficiency. Vitamin B12 was 200 (low) and folate >20.0 on 10/03/2019.  She was on weekly B12 injections x 3 and is now on monthly B12 injections (last 12/20/2019).  Antiparietal antibody and intrinsic factor antibodies were negative on 12/05/2019.  She is a Restaurant manager, fast food.  She receives Retacrit (last 12/13/2019) and IV iron support.  Ferritin was 60 on 01/10/2020.  She received Venofer on 12/05/2019 and 12/13/2019.  She received both doses of the COVID-19 vaccine in 08/2019.  Symptomatically, she feels good.  She has had some nausea well controlled with ondansetron.  Exam is stable.  Plan: 1.   Labs today: CBC with diff, CMP, Mg, ferritin, beta2 microglobulin. 2.   Stage III multiple myeloma             Patient was diagnosed with a 5.7 gm/dL biclonal IgA gammopathy with kappa specificity.   M-spike was 3.5 on 12/27/2019.             Kappa free light chains were 1,376.3 with a ratio > 917.53 (0.26-1.65) on 10/26/2019.     Kappa free light chains were 768 on 01/03/2020/             Beta2 microglobulin was 10.3 (0.6 - 2.4).              Bone survey reveals lytic lesions in skull.             She receives [address] (cycle length 28 days).                         Daratumumab SQ 1800 mg  on days 1, 8, 15, and 22 for cycles #1 and #2.                         Revlimid 10 mg po day 1-21 cycle #2.                         Decadron 20 mg po days 1-2, 8-9, 15-16, and 22-23.             Prophylaxis:                         Singulair 10 mg po the day before, the day of and 2 days after daratumumab.                         Acyclovir 400 mg po BID.                         Aspirin 81 mg a day.             Supportive care:                         She receives Xgeva monthly (last 01/03/2020).  Labs reviewed. Day 15 of cycle #2 daratumumab.     Continue Revlimid.   Discuss trend in counts.  WBC 3000 ([place] 2100).     Patient advised to contact clinic if any fever.  Review plan for MD assessment every 2 weeks beginning cycle #3.  Discuss symptom management.  Shehas antiemetics at home to use on a prn bases.  Interventions are adequate.         3.   Anemia  Hematocrit 33.7.  Hemoglobin 11.0.  MCV 109.8.             Etiology is secondary to myeloma.             She is unable to receive PRBCs.             She continues aggressive supportive care with Venofer and Retacrit as needed.                         Ferritin goal 100-400.                         Hemoglobin goal 11.             Continue to monitor. 4.   B12 deficiency             She receives B12 monthly (last 12/20/2019). 5.   No Retacrit today. 6.   Day 15 of cycle #2 daratumumab SQ today. 7.   RTC in 1 week for MD assessment, labs (CBC with diff, CMP, Mg), day 22 of cycle #2 daratumumab, +/- Venofer and +/- Retacrit.  I discussed the assessment and treatment plan with the patient.  The patient was provided an opportunity to ask questions and all were answered.  The patient agreed with the plan and demonstrated an understanding of the instructions.  The patient was advised to call back if the symptoms worsen or if the condition fails to improve as anticipated.   Drinda Belgard C. Mike Gip, MD, PhD    01/10/2020, 9:22 AM  I,  Mirian Mo Tufford, am acting as a Education administrator for [employer]. Mike Gip, MD.   I, Atreyu Mak C. Mike Gip, MD, have reviewed the above documentation for accuracy and completeness, and I agree with the above.

## 2020-01-10 ENCOUNTER — Inpatient Hospital Stay: Payer: Medicare HMO

## 2020-01-10 ENCOUNTER — Encounter: Payer: Self-pay | Admitting: Hematology and Oncology

## 2020-01-10 ENCOUNTER — Other Ambulatory Visit: Payer: Self-pay

## 2020-01-10 ENCOUNTER — Inpatient Hospital Stay: Payer: Medicare HMO | Admitting: Hematology and Oncology

## 2020-01-10 VITALS — BP 138/69 | HR 108 | Temp 96.8°F | Resp 18 | Wt 211.6 lb

## 2020-01-10 VITALS — BP 126/69 | HR 91 | Resp 18

## 2020-01-10 DIAGNOSIS — Z7189 Other specified counseling: Secondary | ICD-10-CM

## 2020-01-10 DIAGNOSIS — D649 Anemia, unspecified: Secondary | ICD-10-CM

## 2020-01-10 DIAGNOSIS — Z5112 Encounter for antineoplastic immunotherapy: Secondary | ICD-10-CM | POA: Diagnosis not present

## 2020-01-10 DIAGNOSIS — C9000 Multiple myeloma not having achieved remission: Secondary | ICD-10-CM | POA: Diagnosis not present

## 2020-01-10 DIAGNOSIS — D539 Nutritional anemia, unspecified: Secondary | ICD-10-CM

## 2020-01-10 DIAGNOSIS — E538 Deficiency of other specified B group vitamins: Secondary | ICD-10-CM

## 2020-01-10 LAB — COMPREHENSIVE METABOLIC PANEL
ALT: 30 U/L (ref 0–44)
AST: 23 U/L (ref 15–41)
Albumin: 3.3 g/dL — ABNORMAL LOW (ref 3.5–5.0)
Alkaline Phosphatase: 66 U/L (ref 38–126)
Anion gap: 12 (ref 5–15)
BUN: 21 mg/dL (ref 8–23)
CO2: 23 mmol/L (ref 22–32)
Calcium: 8.4 mg/dL — ABNORMAL LOW (ref 8.9–10.3)
Chloride: 100 mmol/L (ref 98–111)
Creatinine, Ser: 1.15 mg/dL — ABNORMAL HIGH (ref 0.44–1.00)
GFR calc Af Amer: 55 mL/min — ABNORMAL LOW (ref >60)
GFR calc non Af Amer: 47 mL/min — ABNORMAL LOW (ref >60)
Glucose, Bld: 91 mg/dL (ref 70–99)
Potassium: 3.8 mmol/L (ref 3.5–5.1)
Sodium: 135 mmol/L (ref 135–145)
Total Bilirubin: 0.4 mg/dL (ref 0.3–1.2)
Total Protein: 9.4 g/dL — ABNORMAL HIGH (ref 6.5–8.1)

## 2020-01-10 LAB — CBC WITH DIFFERENTIAL/PLATELET
Abs Immature Granulocytes: 0.01 10*3/uL (ref 0.00–0.07)
Basophils Absolute: 0 10*3/uL (ref 0.0–0.1)
Basophils Relative: 0 %
Eosinophils Absolute: 0.3 10*3/uL (ref 0.0–0.5)
Eosinophils Relative: 9 %
HCT: 33.7 % — ABNORMAL LOW (ref 36.0–46.0)
Hemoglobin: 11 g/dL — ABNORMAL LOW (ref 12.0–15.0)
Immature Granulocytes: 0 %
Lymphocytes Relative: 15 %
Lymphs Abs: 0.4 10*3/uL — ABNORMAL LOW (ref 0.7–4.0)
MCH: 35.8 pg — ABNORMAL HIGH (ref 26.0–34.0)
MCHC: 32.6 g/dL (ref 30.0–36.0)
MCV: 109.8 fL — ABNORMAL HIGH (ref 80.0–100.0)
Monocytes Absolute: 0.2 10*3/uL (ref 0.1–1.0)
Monocytes Relative: 7 %
Neutro Abs: 2.1 10*3/uL (ref 1.7–7.7)
Neutrophils Relative %: 69 %
Platelets: 263 10*3/uL (ref 150–400)
RBC: 3.07 MIL/uL — ABNORMAL LOW (ref 3.87–5.11)
RDW: 14.1 % (ref 11.5–15.5)
WBC: 3 10*3/uL — ABNORMAL LOW (ref 4.0–10.5)
nRBC: 0 % (ref 0.0–0.2)

## 2020-01-10 LAB — FERRITIN: Ferritin: 60 ng/mL (ref 11–307)

## 2020-01-10 LAB — MAGNESIUM: Magnesium: 2 mg/dL (ref 1.7–2.4)

## 2020-01-10 MED ORDER — DEXAMETHASONE 4 MG PO TABS
20.0000 mg | ORAL_TABLET | Freq: Once | ORAL | Status: AC
  Administered 2020-01-10: 20 mg via ORAL
  Filled 2020-01-10: qty 5

## 2020-01-10 MED ORDER — DARATUMUMAB-HYALURONIDASE-FIHJ 1800-30000 MG-UT/15ML ~~LOC~~ SOLN
1800.0000 mg | Freq: Once | SUBCUTANEOUS | Status: AC
  Administered 2020-01-10: 1800 mg via SUBCUTANEOUS
  Filled 2020-01-10: qty 15

## 2020-01-10 MED ORDER — DIPHENHYDRAMINE HCL 25 MG PO CAPS
50.0000 mg | ORAL_CAPSULE | Freq: Once | ORAL | Status: AC
  Administered 2020-01-10: 50 mg via ORAL
  Filled 2020-01-10: qty 2

## 2020-01-10 MED ORDER — ONDANSETRON HCL 4 MG PO TABS
8.0000 mg | ORAL_TABLET | Freq: Once | ORAL | Status: AC
  Administered 2020-01-10: 8 mg via ORAL
  Filled 2020-01-10: qty 2

## 2020-01-10 MED ORDER — ACETAMINOPHEN 325 MG PO TABS
650.0000 mg | ORAL_TABLET | Freq: Once | ORAL | Status: AC
  Administered 2020-01-10: 650 mg via ORAL
  Filled 2020-01-10: qty 2

## 2020-01-11 LAB — BETA 2 MICROGLOBULIN, SERUM: Beta-2 Microglobulin: 10.3 mg/L — ABNORMAL HIGH (ref 0.6–2.4)

## 2020-01-16 ENCOUNTER — Other Ambulatory Visit: Payer: Self-pay | Admitting: Hematology and Oncology

## 2020-01-16 DIAGNOSIS — C9000 Multiple myeloma not having achieved remission: Secondary | ICD-10-CM

## 2020-01-16 NOTE — Progress Notes (Signed)
[facility]  [address] [place], [place] [address] Phone: [phone]  Fax: [phone]   Clinic Day:  01/17/2020  Referring physician: Denton Lank, MD  Chief Complaint: Laurie [name] is a 74 y.o. female with multiple myeloma who is seen for assessment prior to day 22 of cycle #2 daratumumab SQ, Revlimid, and Decadron.  HPI: The patient was last seen in the hematology clinic on 01/10/2020. At that time, she felt good.  She had some nausea well controlled with ondansetron.  Exam was stable. Hematocrit was 33.7, hemoglobin 11.0, MCV 109.8, platelets 263,000, WBC 3,000 (ANC 2.1). Creatinine was 1.15 (CrCl 62ml/min). Calcium was 8.4.  Albumin was 3.3. Ferritin was 60. Beta-2 microglobulin was 10.3. She received day 15 of cycle #2 daratumumab SQ  During the interim, she has been "ok".  Today is her best day since receiving treatment. She has been having back pain and nausea and plans to pick her nausea medication up today. She denies vomiting and blood in the stool. She does not take calcium regularly, but does take Tums occasionally.  She finished her Revlimid yesterday.   Past Medical History:  Diagnosis Date  . Anxiety   . Asthma   . Depression   . Head injury   . Osteoporosis     Past Surgical History:  Procedure Laterality Date  . ABDOMINAL HYSTERECTOMY    . HEMORROIDECTOMY    . SEPTOPLASTY      Family History  Problem Relation Age of Onset  . Breast cancer Cousin        mat cousin    Social History:  reports that she has never smoked. She has never used smokeless tobacco. She reports that she does not drink alcohol and does not use drugs.  She denies any exposure to radiation or toxins. She denies alcohol and tobacco use. Her husband has schizophrenia and committed suicide. The patient is retired and has not worked since 66. 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  She then  worked in the [employer] x 5 years until disability at age 41.  She lives in [place], [place]. She has a Neurosurgeon named Phoebe.  She is a Sales promotion account executive [employer], and will not accept blood products.  The patient is alone today.   Allergies:  Allergies  Allergen Reactions  . Levofloxacin     Critical   . Other Other (See Comments)    Antihistamines    Current Medications: Current Outpatient Medications  Medication Sig Dispense Refill  . acyclovir (ZOVIRAX) 400 MG tablet Take 1 tablet (400 mg total) by mouth 2 (two) times daily. 60 tablet 11  . albuterol (VENTOLIN HFA) 108 (90 Base) MCG/ACT inhaler 2 inhalations every 4 (four) hours as needed.    Marland Kitchen alendronate (FOSAMAX) 70 MG tablet     . aspirin 81 MG chewable tablet Chew by mouth daily.    Marland Kitchen atorvastatin (LIPITOR) 20 MG tablet Take by mouth.    . busPIRone (BUSPAR) 10 MG tablet Take 10 mg by mouth 2 (two) times daily.    Marland Kitchen dexamethasone (DECADRON) 4 MG tablet 5 TAB BY MOUTH ONCE A WEEK. 5 TAB WEEKLY THE DAY AFTER DARATUMUMAB X 8 WEEKS. TAKE WITH BREAKFAST. 40 tablet 0  . docusate sodium (COLACE) 100 MG capsule Take 100 mg by mouth daily.    . fluticasone-salmeterol (ADVAIR HFA) 230-21 MCG/ACT inhaler Inhale into the lungs.    . montelukast (  SINGULAIR) 10 MG tablet Take 10 mg by mouth at bedtime.    . naproxen (NAPROSYN) 500 MG tablet Take 500 mg by mouth 2 (two) times daily with a meal.    . nortriptyline (PAMELOR) 25 MG capsule Take by mouth.    Marland Kitchen omeprazole (PRILOSEC) 20 MG capsule Take 20 mg by mouth daily.    . ondansetron (ZOFRAN) 8 MG tablet Take 1 tablet (8 mg total) by mouth every 8 (eight) hours as needed (Nausea or vomiting). 30 tablet 1  . sertraline (ZOLOFT) 100 MG tablet Take 200 mg by mouth daily.     . traZODone (DESYREL) 100 MG tablet Take 25 mg by mouth at bedtime.     . Vitamin D, Ergocalciferol, (DRISDOL) 50000 UNITS CAPS capsule Take 50,000 Units by mouth every 7 (seven) days.     No current facility-administered medications  for this visit.    Review of Systems  Constitutional: Negative for chills, diaphoresis, fever, malaise/fatigue and weight loss (stable).       Feels "ok" today.  Today is the best day since initiation of treatment.  HENT: Negative.  Negative for congestion, ear discharge, ear pain, hearing loss, nosebleeds, sinus pain, sore throat and tinnitus.   Eyes: Negative.  Negative for blurred vision.  Respiratory: Negative for cough, hemoptysis, sputum production and shortness of breath (asthma/heat related).        Asthma.  Cardiovascular: Negative.  Negative for chest pain, palpitations and leg swelling.  Gastrointestinal: Positive for nausea. Negative for abdominal pain, blood in stool, constipation (on stool softener), diarrhea, heartburn (on medication), melena and vomiting.  Genitourinary: Negative.  Negative for dysuria, frequency, hematuria and urgency.  Musculoskeletal: Positive for back pain. Negative for falls, joint pain, myalgias and neck pain.  Skin: Negative.  Negative for itching and rash.  Neurological: Negative for dizziness, tingling, sensory change, weakness and headaches.       Occasional balance problems. Traumatic brain injury (fall) 3 years ago.  Endo/Heme/Allergies: Negative.  Does not bruise/bleed easily.  Psychiatric/Behavioral: Negative.  Negative for depression and memory loss. The patient is not nervous/anxious and does not have insomnia.   All other systems reviewed and are negative.  Performance status (ECOG): 1  Vitals Blood pressure (!) 135/56, pulse (!) 104, temperature (!) 97.3 F (36.3 C), temperature source Tympanic, resp. rate 18, weight 211 lb 10.3 oz (96 kg), SpO2 97 %.   Physical Exam Vitals and nursing note reviewed.  Constitutional:      General: She is not in acute distress.    Appearance: Normal appearance. She is not diaphoretic.  HENT:     Head: Normocephalic and atraumatic.     Comments: Shoulder length gray hair.  Mask.    Ears:      Comments: Glasses.  Blue eyes.    Mouth/Throat:     Mouth: Mucous membranes are moist.     Pharynx: Oropharynx is clear.  Eyes:     General: No scleral icterus.    Extraocular Movements: Extraocular movements intact.     Conjunctiva/sclera: Conjunctivae normal.     Pupils: Pupils are equal, round, and reactive to light.  Cardiovascular:     Rate and Rhythm: Normal rate and regular rhythm.     Heart sounds: Normal heart sounds. No murmur heard.   Pulmonary:     Effort: Pulmonary effort is normal. No respiratory distress.     Breath sounds: Normal breath sounds. No wheezing or rales.  Chest:     Chest wall: No  tenderness.  Abdominal:     General: Bowel sounds are normal. There is no distension.     Palpations: Abdomen is soft. There is no hepatomegaly, splenomegaly or mass.     Tenderness: There is no abdominal tenderness. There is no guarding or rebound.  Musculoskeletal:        General: No swelling or tenderness. Normal range of motion.     Cervical back: Normal range of motion and neck supple.  Lymphadenopathy:     Head:     Right side of head: No preauricular, posterior auricular or occipital adenopathy.     Left side of head: No preauricular, posterior auricular or occipital adenopathy.     Cervical: No cervical adenopathy.     Upper Body:     Right upper body: No supraclavicular or axillary adenopathy.     Left upper body: No supraclavicular or axillary adenopathy.     Lower Body: No right inguinal adenopathy. No left inguinal adenopathy.  Skin:    General: Skin is warm and dry.  Neurological:     Mental Status: She is alert and oriented to person, place, and time. Mental status is at baseline.  Psychiatric:        Mood and Affect: Affect normal.        Behavior: Behavior normal.        Thought Content: Thought content normal.        Judgment: Judgment normal.    No visits with results within 3 Day(s) from this visit.  Latest known visit with results is:  Hospital  Outpatient Visit on 11/13/2019  Component Date Value Ref Range Status  . WBC 11/13/2019 3.0* 4.0 - 10.5 K/uL Final  . RBC 11/13/2019 2.40* 3.87 - 5.11 MIL/uL Final  . Hemoglobin 11/13/2019 8.7* 12.0 - 15.0 g/dL Final  . HCT 11/13/2019 26.3* 36 - 46 % Final  . MCV 11/13/2019 109.6* 80.0 - 100.0 fL Final  . MCH 11/13/2019 36.3* 26.0 - 34.0 pg Final  . MCHC 11/13/2019 33.1  30.0 - 36.0 g/dL Final  . RDW 11/13/2019 14.0  11.5 - 15.5 % Final  . Platelets 11/13/2019 239  150 - 400 K/uL Final  . nRBC 11/13/2019 0.0  0.0 - 0.2 % Final  . Neutrophils Relative % 11/13/2019 66  % Final  . Neutro Abs 11/13/2019 2.0  1.7 - 7.7 K/uL Final  . Lymphocytes Relative 11/13/2019 23  % Final  . Lymphs Abs 11/13/2019 0.7  0.7 - 4.0 K/uL Final  . Monocytes Relative 11/13/2019 7  % Final  . Monocytes Absolute 11/13/2019 0.2  0 - 1 K/uL Final  . Eosinophils Relative 11/13/2019 4  % Final  . Eosinophils Absolute 11/13/2019 0.1  0 - 0 K/uL Final  . Basophils Relative 11/13/2019 0  % Final  . Basophils Absolute 11/13/2019 0.0  0 - 0 K/uL Final  . Immature Granulocytes 11/13/2019 0  % Final  . Abs Immature Granulocytes 11/13/2019 0.01  0.00 - 0.07 K/uL Final   Performed at [facility], [address] [place], [place] [address]  . Prothrombin Time 11/13/2019 16.0* 11.4 - 15.2 seconds Final  . INR 11/13/2019 1.3* 0.8 - 1.2 Final   Comment: (NOTE) INR goal varies based on device and disease states. Performed at [facility], [address] [place], [place] [address]   . SURGICAL PATHOLOGY 11/13/2019    Final-Edited  Value:SURGICAL PATHOLOGY CASE: [id number] PATIENT: Rhonda Walts Bone Marrow Report  Clinical History: Monoclonal gammopathy, right ilium, (ADC)  DIAGNOSIS:  BONE MARROW, ASPIRATE, CLOT, CORE: -Hypercellular bone marrow with extensive involvement by plasma cell neoplasm -See comment  PERIPHERAL BLOOD: -Macrocytic  anemia -Leukopenia  COMMENT:  The bone marrow shows extensive infiltration by atypical plasma cells representing 70% of all cells in the aspirate associated with prominent interstitial infiltrates and diffuse sheets in the clot and biopsy sections.  The plasma cells display kappa light chain restriction consistent with plasma cell neoplasm.  Correlation with cytogenetic and FISH studies is recommended.  MICROSCOPIC DESCRIPTION:  PERIPHERAL BLOOD SMEAR: The red blood cells display mild anisopoikilocytosis with mild polychromasia.  Prominent rouleaux formation is seen.  The white blood cells are decreased in number with no significant mor                         phologic abnormalities.  The platelets are normal in number.  BONE MARROW ASPIRATE: Bone marrow particles present Erythroid precursors: Progressive maturation with scattered maturing precursors displaying nuclear cytoplasmic dyssynchrony Granulocytic precursors: Orderly and progressive maturation Megakaryocytes: Present with predominantly normal morphology Lymphocytes/plasma cells: The plasma cells are markedly increased in number representing 70% of all cells associated with atypical cytomorphologic features characterized by cytomegaly and/or small nucleoli significant lymphoid aggregates are not present.  TOUCH PREPARATIONS: Mixture of cell types with prominent increase in atypical plasma cells  CLOT AND BIOPSY: The sections show 70 to 90% cellularity with interstitial infiltrates and diffuse sheets of atypical plasma cells characterized by partially clumped to vesicular chromatin and prominent nucleoli.  Residual trilineage hematopoiesis is variably present in the                          background. Immunohistochemical stain for CD138 and in situ hybridization for kappa and lambda were performed on blocks B1 and C1 with appropriate controls.  CD138 highlights the extensive plasma cell component in the bone marrow  which shows kappa light chain restriction.   IRON STAIN: Iron stains are performed on a bone marrow aspirate or touch imprint smear and section of clot. The controls stained appropriately.       Storage Iron: Decreased      Ring Sideroblasts: Absent  ADDITIONAL DATA/TESTING: The specimen was sent for cytogenetic analysis and FISH for multiple myeloma and a separate report will follow  CELL COUNT DATA:  Bone Marrow count performed on 500 cells shows: Blasts:   0%   Myeloid:  17% Promyelocytes: 0%   Erythroid:     11% Myelocytes:    1%   Lymphocytes:   2% Metamyelocytes:     1%   Plasma cells:  70% Bands:    3% Neutrophils:   11%  M:E ratio:     1.5 Eosinophils:   1% Basophils:     0% Monocytes:     0%  Lab Data: CBC performed on                          11/13/19 shows: WBC: 3.0 k/uL  Neutrophils:   67% Hgb: 8.7 g/dL  Lymphocytes:   21% HCT: 26.3 %    Monocytes:     6% MCV: 109.6 fL  Eosinophils:   5% RDW: 14 % Basophils:     1% PLT: 239 k/uL  GROSS DESCRIPTION:  A: Aspirate smear  B: Received in  B-plus fixative are tissue fragments measuring 0.4 x 0.3 x 0.1 cm in aggregate.  The specimen is submitted in toto.  C: Received in B-plus fixative is a 1.4 x 0.2 cm core of bone which is submitted in toto following decalcification.  [facility] 11/13/2019)  Final Diagnosis performed by Susanne Greenhouse, MD.   Electronically signed 11/15/2019 Technical and / or Professional components performed at [facility], [place] [address] [place], [place] [address].  Immunohistochemistry Technical component (if applicable) was performed at [facility]. [address] [place], [place], [place] [address].   IMMUNOHISTOCHEMISTRY DISCLAIMER (if applicable): Some of these immunohistochemical stains may hav                         e been developed and the performance characteristics determine by [facility]. Some may not have been cleared or approved by  the U.S. Food and Drug Administration. The FDA has determined that such clearance or approval is not necessary. This test is used for clinical purposes. It should not be regarded as investigational or for research. This laboratory is certified under the [employer] (CLIA-88) as qualified to perform high complexity clinical laboratory testing.  The controls stained appropriately.    Assessment:  Laurie [name] is a 74 y.o. female with stage III biclonal IgA multiple myeloma.  SPEP was 5.0 gm/dL on 10/03/2019. Random urine revealed 196.6 mg/dL total protein with 58.3% M-spike.    Bone marrow biopsy on 11/13/2019 revealed a hypercellular bone marrow with extensive involvement by plasma cell neoplasm. Atypical plasma cells represented 70% of all cells in the aspirate and was associated with prominent interstitial infiltrates and diffuse sheets in the clot.  Plasma cells were kappa light chain restricted.   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     Labs on 10/03/2019 revealed a hematocrit 27.9, hemoglobin 9.4, MCV 108, platelets 287,000, WBC 3,800 (ANC 2400).   Creatinine was 0.94, albumin 3.4, and protein 11.2.  Ferritin was 82 with an iron saturation of 30% and a TIBC of 206.  Hepatitis C antibody and HIV testing were negative.    Work-up on 10/26/2019 revealed a hematocrit was 27.4, hemoglobin 9.1, MCV 109.6, platelets 250,000, WBC 3,400 (ANC 2,100).  Retic was 2.2%.  Creatinine was 1.06 and calcium 8.6. Total protein was 11.4 and albumin 3.2.  M-spike was 5.7 gm/dL biclonal IgA protein with kappa specificity.  Kappa free light chain were 1,376.3, lambda free light chains <1.5, and ratio  > 917.53 (0.26-1.65).  Beta 2 microglobulin was 10.3 (0.6 - 2.4). LDH was 102. TSH was 3.673.  Hepatitis serologies were negative on 11/28/2019.  24 hour UPEP on 10/30/2019 revealed 1020 mg/24 hours protein with 65.3% M-spike (665.9 mg/24 hours).  Urine free kappa light chain were 2,620.44, free lambda light chain 6.01 and ratio 436.01 (1.03-31.76).  Bone survey on 10/26/2019 revealed small lytic lesions in the skull. There was no acute fracture.  SPEP has been followed (gm/dL): 5.7 on 10/26/2019 and 3.5 on 12/27/2019. Kappa free light chains have been followed (mg/L): 1376.3 on 10/26/2019 and 768 on 01/03/2020.  She is day 22 of cycle #2 daratumumab SQ and Decadron (11/29/2019 - 12/27/2019).  She  began monthly Xgeva on 12/05/2019 (last 01/03/2020).  She has B12 deficiency. Vitamin B12 was 200 (low) and folate >20.0 on 10/03/2019.  She was on weekly B12 injections x 3 and is now on monthly B12 injections (last 12/20/2019).  Antiparietal antibody and intrinsic factor antibodies were negative on 12/05/2019.  She is a Restaurant manager, fast food.  She receives Retacrit (last 12/13/2019) and IV iron support.  Ferritin was 60 on 01/10/2020.  She received Venofer on 12/05/2019 and 12/13/2019.  She received both doses of the COVID-19 vaccine in 08/2019.  Symptomatically, she is feeling good today.  Exam is stable.  Plan: 1.   Labs today: CBC with diff, CMP, Mg 2.   Stage III multiple myeloma             Patient was diagnosed with a 5.7 gm/dL biclonal IgA gammopathy with kappa specificity.   M-spike was 3.5 on 12/27/2019.             Kappa free light chains were 1,376.3 with a ratio > 917.53 (0.26-1.65) on 10/26/2019.     Kappa free light chains were 768 on 01/03/2020             Bone survey reveals lytic lesions in skull.             She receives [address] (cycle length 28 days).                         Daratumumab SQ 1800 mg on days 1, 8, 15, and 22 for cycles #1 and #2.                         Revlimid  10 mg po day 1-21 cycle #2.                         Decadron 20 mg po days 1-2, 8-9, 15-16, and 22-23.             Prophylaxis:                         Singulair 10 mg po the day before, the day of and 2 days after daratumumab.                         Acyclovir 400 mg po BID.                         Aspirin 81 mg a day.             Supportive care:                         She receives Xgeva monthly (last 01/03/2020).  Labs reviewed.  Hold chemotherapy today secondary to leukopenia (WBC 1900 with an ANC of 1000).     Patient completed her Revlimid yesterday.  Discuss symptom management.  Shehas antiemetics at home to use on a prn bases.  Interventions are adequate.   .         3.   Anemia             Hematocrit 34.0.  Hemoglobin 11.2.  MCV 107.9.             Etiology is secondary to myeloma.  She is unable to receive PRBCs.             She continues aggressive supportive care with Venofer and Retacrit as needed.                         Ferritin goal 100-400.                         Hemoglobin goal 11.             Ferritin was 60 on 01/10/2020.  Venofer today. 4.   Leukopenia   WBC 1900 with an [place] of 1000.  Etiology secondary to chemotherapy.  Review fever neutropenia precautions. 5.   B12 deficiency             B12 today and monthly x6 (last received 12/20/2019).  Check folate annually. 6.   No daratumumab today. 7.   Venofer today. 8.   B12 today. 9.   RTC in 1 week for MD assessment, labs (CBC with diff, CMP, Mg, SPEP, FLCA) and day 1 of cycle #3 daratumumab.  I discussed the assessment and treatment plan with the patient.  The patient was provided an opportunity to ask questions and all were answered.  The patient agreed with the plan and demonstrated an understanding of the instructions.  The patient was advised to call back if the symptoms worsen or if the condition fails to improve as anticipated.   Seiji Wiswell C. Mike Gip, MD, PhD    01/17/2020, 9:24 AM  I, Mirian Mo  Tufford, am acting as a Education administrator for [employer]. Mike Gip, MD.   I, Normand Damron C. Mike Gip, MD, have reviewed the above documentation for accuracy and completeness, and I agree with the above.

## 2020-01-17 ENCOUNTER — Other Ambulatory Visit: Payer: Self-pay

## 2020-01-17 ENCOUNTER — Inpatient Hospital Stay: Payer: Medicare HMO

## 2020-01-17 ENCOUNTER — Inpatient Hospital Stay: Payer: Medicare HMO | Admitting: Hematology and Oncology

## 2020-01-17 ENCOUNTER — Encounter: Payer: Self-pay | Admitting: Hematology and Oncology

## 2020-01-17 VITALS — BP 135/56 | HR 104 | Temp 97.3°F | Resp 18 | Wt 211.6 lb

## 2020-01-17 VITALS — BP 121/76 | HR 93 | Resp 18

## 2020-01-17 DIAGNOSIS — Z5112 Encounter for antineoplastic immunotherapy: Secondary | ICD-10-CM | POA: Diagnosis not present

## 2020-01-17 DIAGNOSIS — E538 Deficiency of other specified B group vitamins: Secondary | ICD-10-CM

## 2020-01-17 DIAGNOSIS — C9000 Multiple myeloma not having achieved remission: Secondary | ICD-10-CM

## 2020-01-17 DIAGNOSIS — D701 Agranulocytosis secondary to cancer chemotherapy: Secondary | ICD-10-CM

## 2020-01-17 DIAGNOSIS — T451X5A Adverse effect of antineoplastic and immunosuppressive drugs, initial encounter: Secondary | ICD-10-CM

## 2020-01-17 DIAGNOSIS — D649 Anemia, unspecified: Secondary | ICD-10-CM

## 2020-01-17 LAB — COMPREHENSIVE METABOLIC PANEL
ALT: 19 U/L (ref 0–44)
AST: 14 U/L — ABNORMAL LOW (ref 15–41)
Albumin: 3.5 g/dL (ref 3.5–5.0)
Alkaline Phosphatase: 55 U/L (ref 38–126)
Anion gap: 11 (ref 5–15)
BUN: 25 mg/dL — ABNORMAL HIGH (ref 8–23)
CO2: 24 mmol/L (ref 22–32)
Calcium: 8.4 mg/dL — ABNORMAL LOW (ref 8.9–10.3)
Chloride: 101 mmol/L (ref 98–111)
Creatinine, Ser: 1.17 mg/dL — ABNORMAL HIGH (ref 0.44–1.00)
GFR calc Af Amer: 54 mL/min — ABNORMAL LOW (ref >60)
GFR calc non Af Amer: 47 mL/min — ABNORMAL LOW (ref >60)
Glucose, Bld: 99 mg/dL (ref 70–99)
Potassium: 4.1 mmol/L (ref 3.5–5.1)
Sodium: 136 mmol/L (ref 135–145)
Total Bilirubin: 0.6 mg/dL (ref 0.3–1.2)
Total Protein: 8.6 g/dL — ABNORMAL HIGH (ref 6.5–8.1)

## 2020-01-17 LAB — CBC WITH DIFFERENTIAL/PLATELET
Abs Immature Granulocytes: 0.01 10*3/uL (ref 0.00–0.07)
Basophils Absolute: 0 10*3/uL (ref 0.0–0.1)
Basophils Relative: 1 %
Eosinophils Absolute: 0.3 10*3/uL (ref 0.0–0.5)
Eosinophils Relative: 17 %
HCT: 34 % — ABNORMAL LOW (ref 36.0–46.0)
Hemoglobin: 11.2 g/dL — ABNORMAL LOW (ref 12.0–15.0)
Immature Granulocytes: 1 %
Lymphocytes Relative: 19 %
Lymphs Abs: 0.4 10*3/uL — ABNORMAL LOW (ref 0.7–4.0)
MCH: 35.6 pg — ABNORMAL HIGH (ref 26.0–34.0)
MCHC: 32.9 g/dL (ref 30.0–36.0)
MCV: 107.9 fL — ABNORMAL HIGH (ref 80.0–100.0)
Monocytes Absolute: 0.2 10*3/uL (ref 0.1–1.0)
Monocytes Relative: 11 %
Neutro Abs: 1 10*3/uL — ABNORMAL LOW (ref 1.7–7.7)
Neutrophils Relative %: 51 %
Platelets: 189 10*3/uL (ref 150–400)
RBC: 3.15 MIL/uL — ABNORMAL LOW (ref 3.87–5.11)
RDW: 13.6 % (ref 11.5–15.5)
WBC: 1.9 10*3/uL — ABNORMAL LOW (ref 4.0–10.5)
nRBC: 0 % (ref 0.0–0.2)

## 2020-01-17 LAB — MAGNESIUM: Magnesium: 2 mg/dL (ref 1.7–2.4)

## 2020-01-17 MED ORDER — LENALIDOMIDE 5 MG PO CAPS: 5.0000 mg | ORAL_CAPSULE | Freq: Every day | ORAL | 0 refills | Status: DC

## 2020-01-17 MED ORDER — SODIUM CHLORIDE 0.9 % IV SOLN
Freq: Once | INTRAVENOUS | Status: AC
  Filled 2020-01-17: qty 250

## 2020-01-17 MED ORDER — SODIUM CHLORIDE 0.9 % IV SOLN: 200.0000 mg | Freq: Once | INTRAVENOUS | Status: DC

## 2020-01-17 MED ORDER — IRON SUCROSE 20 MG/ML IV SOLN
200.0000 mg | Freq: Once | INTRAVENOUS | Status: AC
  Administered 2020-01-17: 200 mg via INTRAVENOUS
  Filled 2020-01-17: qty 10

## 2020-01-17 MED ORDER — CYANOCOBALAMIN 1000 MCG/ML IJ SOLN
1000.0000 ug | Freq: Once | INTRAMUSCULAR | Status: AC
  Administered 2020-01-17: 1000 ug via INTRAMUSCULAR
  Filled 2020-01-17: qty 1

## 2020-01-17 NOTE — Progress Notes (Signed)
Patient has not felt good this week. She has had nausea and back pain. She feels that the steroids keep her awake. She plans to pick up a refill on her nausea medication today.

## 2020-01-22 ENCOUNTER — Ambulatory Visit: Payer: Medicare HMO

## 2020-01-23 NOTE — Progress Notes (Signed)
[facility]  [address] [place],  [address] Phone: [phone]  Fax: [phone]   Clinic Day:  01/24/2020  Referring physician: Denton Lank, MD  Chief Complaint: Laurie [name] is a 74 y.o. female with multiple myeloma who is seen for assessment prior to day 1 of cycle #3 of daratumumab, Revlimid and Decadron.   HPI: The patient was last seen in the hematology clinic on 01/17/2020. At that time, she was feeling well. Hematocrit was 34.0, hemoglobin 11.2, MCV 107.9, platelets 189,000, WBC 1900, ANC 1000.  Day 22 daratumumab was held secondary to leukopenia.  She received Venofer and B12.   During the interim, she has been well. She notes that she has not been able to get her Revlimid yet as she had difficulty with her phone recently and she had to have her modem replaced through her ISP so her phone would work.  She denies any concerns.  Specifically, she denies any bone pain, shortness of breath, cough, or fever.  She has had no interval infections.   Past Medical History:  Diagnosis Date  . Anxiety   . Asthma   . Depression   . Head injury   . Osteoporosis     Past Surgical History:  Procedure Laterality Date  . ABDOMINAL HYSTERECTOMY    . HEMORROIDECTOMY    . SEPTOPLASTY      Family History  Problem Relation Age of Onset  . Breast cancer Cousin        mat cousin    Social History:  reports that she has never smoked. She has never used smokeless tobacco. She reports that she does not drink alcohol and does not use drugs. She denies any exposure to radiation or toxins. She denies alcohol and tobacco use. Her husband has schizophrenia and committed suicide. The patient is retired and has not worked since 52. 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  She then worked in the [employer] x 5 years until disability at age 41.  She lives in [place], [place]. She has a Neurosurgeon named  Phoebe.  She is a Sales promotion account executive [employer], and will not accept blood products.  The patient is alone today.  Allergies:  Allergies  Allergen Reactions  . Levofloxacin     Critical   . Other Other (See Comments)    Antihistamines    Current Medications: Current Outpatient Medications  Medication Sig Dispense Refill  . acyclovir (ZOVIRAX) 400 MG tablet Take 1 tablet (400 mg total) by mouth 2 (two) times daily. 60 tablet 11  . albuterol (VENTOLIN HFA) 108 (90 Base) MCG/ACT inhaler 2 inhalations every 4 (four) hours as needed.    Marland Kitchen alendronate (FOSAMAX) 70 MG tablet     . aspirin 81 MG chewable tablet Chew by mouth daily.    Marland Kitchen atorvastatin (LIPITOR) 20 MG tablet Take by mouth.    . busPIRone (BUSPAR) 10 MG tablet Take 10 mg by mouth 2 (two) times daily.    Marland Kitchen dexamethasone (DECADRON) 4 MG tablet 5 TAB BY MOUTH ONCE A WEEK. 5 TAB WEEKLY THE DAY AFTER DARATUMUMAB X 8 WEEKS. TAKE WITH BREAKFAST. 40 tablet 0  . docusate sodium (COLACE) 100 MG capsule Take 100 mg by mouth daily.    . fluticasone-salmeterol (ADVAIR HFA) 230-21 MCG/ACT inhaler Inhale into the lungs.    Marland Kitchen lenalidomide (REVLIMID) 5 MG capsule Take 1 capsule (5 mg total) by mouth daily. Take  1 tab po for 21 days and 7 days off [employer] Auth # [id number]   Date Obtained 01/04/2020 21 capsule 0  . montelukast (SINGULAIR) 10 MG tablet Take 10 mg by mouth at bedtime.    . naproxen (NAPROSYN) 500 MG tablet Take 500 mg by mouth 2 (two) times daily with a meal.    . nortriptyline (PAMELOR) 25 MG capsule Take by mouth.    Marland Kitchen omeprazole (PRILOSEC) 20 MG capsule Take 20 mg by mouth daily.    . ondansetron (ZOFRAN) 8 MG tablet Take 1 tablet (8 mg total) by mouth every 8 (eight) hours as needed (Nausea or vomiting). 30 tablet 1  . sertraline (ZOLOFT) 100 MG tablet Take 200 mg by mouth daily.     . traZODone (DESYREL) 100 MG tablet Take 25 mg by mouth at bedtime.     . Vitamin D, Ergocalciferol, (DRISDOL) 50000 UNITS CAPS capsule Take 50,000 Units by mouth  every 7 (seven) days.     No current facility-administered medications for this visit.    Review of Systems  Constitutional: Negative for chills, diaphoresis, fever, malaise/fatigue and weight loss (stable).       Feels good today  HENT: Negative for congestion and sore throat.   Eyes: Negative for blurred vision and double vision.  Respiratory: Negative for cough and shortness of breath (asthma/heat related).   Cardiovascular: Negative for chest pain, palpitations and leg swelling.  Gastrointestinal: Positive for nausea. Negative for constipation (on stool softener), diarrhea, heartburn (on medication) and vomiting.  Genitourinary: Negative for frequency and urgency.  Musculoskeletal: Positive for back pain. Negative for falls and myalgias.  Skin: Negative for rash.  Neurological: Negative for dizziness, sensory change, weakness and headaches.       Occasional balance problems; she "feels wobbly today". Traumatic brain injury (fall) 3 years ago.   Endo/Heme/Allergies: Does not bruise/bleed easily.  Psychiatric/Behavioral: Negative for depression. The patient is not nervous/anxious.    Performance status (ECOG): 1  Vitals There were no vitals taken for this visit.   Physical Exam Constitutional:      General: She is not in acute distress.    Appearance: Normal appearance. She is well-developed.     Interventions: Face mask in place.  HENT:     Head: Normocephalic and atraumatic.     Comments: Shoulder length gray hair.    Right Ear: Hearing normal.     Left Ear: Hearing normal.     Mouth/Throat:     Mouth: No oral lesions.  Eyes:     Conjunctiva/sclera: Conjunctivae normal.     Pupils: Pupils are equal, round, and reactive to light.     Comments: Glasses. Blue eyes.  Cardiovascular:     Rate and Rhythm: Normal rate and regular rhythm.     Heart sounds: Normal heart sounds. No murmur heard.  No friction rub. No gallop.   Pulmonary:     Effort: Pulmonary effort is normal.      Breath sounds: Normal breath sounds. No wheezing, rhonchi or rales.  Abdominal:     General: Bowel sounds are normal.     Palpations: Abdomen is soft. There is no hepatomegaly, splenomegaly or mass.     Tenderness: There is no abdominal tenderness.  Musculoskeletal:        General: No tenderness. Normal range of motion.     Cervical back: Normal range of motion and neck supple.  Lymphadenopathy:     Head:     Right side of head: No  preauricular, posterior auricular or occipital adenopathy.     Left side of head: No preauricular, posterior auricular or occipital adenopathy.     Cervical: No cervical adenopathy.     Upper Body:     Right upper body: No supraclavicular or axillary adenopathy.     Left upper body: No supraclavicular or axillary adenopathy.     Lower Body: No right inguinal adenopathy. No left inguinal adenopathy.  Skin:    General: Skin is warm and dry.     Findings: No bruising, erythema, lesion or rash.  Neurological:     Mental Status: She is alert and oriented to person, place, and time.  Psychiatric:        Behavior: Behavior normal.        Thought Content: Thought content normal.        Judgment: Judgment normal.    No visits with results within 3 Day(s) from this visit.  Latest known visit with results is:  Appointment on 01/17/2020  Component Date Value Ref Range Status  . Magnesium 01/17/2020 2.0  1.7 - 2.4 mg/dL Final   Performed at [facility], [address] [place], [place] [address]  . Sodium 01/17/2020 136  135 - 145 mmol/L Final  . Potassium 01/17/2020 4.1  3.5 - 5.1 mmol/L Final  . Chloride 01/17/2020 101  98 - 111 mmol/L Final  . CO2 01/17/2020 24  22 - 32 mmol/L Final  . Glucose, Bld 01/17/2020 99  70 - 99 mg/dL Final   Glucose reference range applies only to samples taken after fasting for at least 8 hours.  . BUN 01/17/2020 25* 8 - 23 mg/dL Final  . Creatinine, Ser 01/17/2020 1.17* 0.44 - 1.00 mg/dL Final  . Calcium  01/17/2020 8.4* 8.9 - 10.3 mg/dL Final  . Total Protein 01/17/2020 8.6* 6.5 - 8.1 g/dL Final  . Albumin 01/17/2020 3.5  3.5 - 5.0 g/dL Final  . AST 01/17/2020 14* 15 - 41 U/L Final  . ALT 01/17/2020 19  0 - 44 U/L Final  . Alkaline Phosphatase 01/17/2020 55  38 - 126 U/L Final  . Total Bilirubin 01/17/2020 0.6  0.3 - 1.2 mg/dL Final  . GFR calc non Af Amer 01/17/2020 47* >60 mL/min Final  . GFR calc Af Amer 01/17/2020 54* >60 mL/min Final  . Anion gap 01/17/2020 11  5 - 15 Final   Performed at [facility], [address] [place],  [address]  . WBC 01/17/2020 1.9* 4.0 - 10.5 K/uL Final  . RBC 01/17/2020 3.15* 3.87 - 5.11 MIL/uL Final  . Hemoglobin 01/17/2020 11.2* 12.0 - 15.0 g/dL Final  . HCT 01/17/2020 34.0* 36 - 46 % Final  . MCV 01/17/2020 107.9* 80.0 - 100.0 fL Final  . MCH 01/17/2020 35.6* 26.0 - 34.0 pg Final  . MCHC 01/17/2020 32.9  30.0 - 36.0 g/dL Final  . RDW 01/17/2020 13.6  11.5 - 15.5 % Final  . Platelets 01/17/2020 189  150 - 400 K/uL Final  . nRBC 01/17/2020 0.0  0.0 - 0.2 % Final  . Neutrophils Relative % 01/17/2020 51  % Final  . Neutro Abs 01/17/2020 1.0* 1.7 - 7.7 K/uL Final  . Lymphocytes Relative 01/17/2020 19  % Final  . Lymphs Abs 01/17/2020 0.4* 0.7 - 4.0 K/uL Final  . Monocytes Relative 01/17/2020 11  % Final  . Monocytes Absolute 01/17/2020 0.2  0 - 1 K/uL Final  . Eosinophils Relative 01/17/2020 17  % Final  .  Eosinophils Absolute 01/17/2020 0.3  0 - 0 K/uL Final  . Basophils Relative 01/17/2020 1  % Final  . Basophils Absolute 01/17/2020 0.0  0 - 0 K/uL Final  . Immature Granulocytes 01/17/2020 1  % Final  . Abs Immature Granulocytes 01/17/2020 0.01  0.00 - 0.07 K/uL Final   Performed at [facility], [address] [place], [place] [address]    Assessment:  Laurie [name] is a 74 y.o. female with stage III biclonal IgA multiple myeloma.  SPEP was 5.0 gm/dL on 10/03/2019. Random urine revealed 196.6 mg/dL total  protein with 58.3% M-spike.    Bone marrow biopsy on 11/13/2019 revealed a hypercellular bone marrow with extensive involvement by plasma cell neoplasm. Atypical plasma cells represented 70% of all cells in the aspirate and was associated with prominent interstitial infiltrates and diffuse sheets in the clot.  Plasma cells were kappa light chain restricted.   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     Labs on 10/03/2019 revealed a hematocrit 27.9, hemoglobin 9.4, MCV 108, platelets 287,000, WBC 3,800 (ANC 2400).   Creatinine was 0.94, albumin 3.4, and protein 11.2.  Ferritin was 82 with an iron saturation of 30% and a TIBC of 206.  Hepatitis C antibody and HIV testing were negative.    Work-up on 10/26/2019 revealed a hematocrit was 27.4, hemoglobin 9.1, MCV 109.6, platelets 250,000, WBC 3,400 (ANC 2,100).  Retic was 2.2%.  Creatinine was 1.06 and calcium 8.6. Total protein was 11.4 and albumin 3.2.  M-spike was 5.7 gm/dL biclonal IgA protein with kappa specificity.  Kappa free light chain were 1,376.3, lambda free light chains <1.5, and ratio > 917.53 (0.26-1.65).  Beta 2 microglobulin was 10.3 (0.6 - 2.4). LDH was 102. TSH was 3.673.  Hepatitis serologies were negative on 11/28/2019.  24 hour UPEP on 10/30/2019 revealed 1020 mg/24 hours protein with 65.3% M-spike (665.9 mg/24 hours).  Urine free kappa light chain were 2,620.44, free lambda light chain 6.01 and ratio 436.01 (1.03-31.76).  Bone survey on 10/26/2019 revealed small lytic lesions in the skull. There was no acute fracture.  SPEP has been followed (gm/dL): 5.7 on 10/26/2019, 3.5 on 12/27/2019, and 2.3 on 01/24/2020. Kappa free light chains have been followed  (mg/L): 1376.3 on 10/26/2019, 768 on 01/03/2020, and 578.7 on 01/24/2020.  She is day 1 of cycle #3 daratumumab SQ and Decadron (11/29/2019 - 01/24/2020).  She began monthly Xgeva on 12/05/2019 (last 01/03/2020).  She has B12 deficiency. Vitamin B12 was 200 (low) and folate >20.0 on 10/03/2019.  She was on weekly B12 injections x 3 and is now on monthly B12 injections (last 01/17/2020).  Antiparietal antibody and intrinsic factor antibodies were negative on 12/05/2019.  She is a Restaurant manager, fast food.  She receives Retacrit (last 12/13/2019) and IV iron support.  Ferritin was 60 on 01/10/2020.  She received Venofer on 12/05/2019, 12/13/2019, and 01/17/2020.  She received both doses of the COVID-19 vaccine in 08/2019.  Symptomatically, she feels good.  She denies any complaints.  Exam is stable.  Plan: 1.   Labs today: CBC with diff, CMP, Mg, SPEP, FLCA. 2.   Stage III multiple myeloma Patient was diagnosed with a 5.7 gm/dL biclonal IgA gammopathy with kappa specificity.  M-spike was 3.5 on 12/27/2019. Kappa free light chains were 1,376.3 with a ratio >917.53 (0.26-1.65) on 10/26/2019.                          Kappa free light chains were 768 on 01/03/2020 Bone survey reveals lytic lesions in skull. She receives [address] (cycle length 28 days). Daratumumab SQ 1800 mg on days 1 and 15 for cycles #3 - #6. Revlimid 5 mg po day 1-21 cycle #2. Decadron 20 mg po days 1-2 and 15-16. Prophylaxis: Singulair 10 mg po the day before, the day of and 2 days after daratumumab. Acyclovir 400 mg po BID. Aspirin 81 mg a day. Supportive care: She receives Xgeva monthly (last 01/03/2020).             Labs reviewed. Day 1 of cycle #3 daratumumab  held secondary to leukopenia.                         Continue neutropenic precuations.               Anticipate initiation of cycle #3 next week.  Discuss symptom management.  She has antiemetics at home to use on a prn basis.  Interventions are adequate.       3. Anemia Hematocrit 34.4. Hemoglobin 11.0. MCV 108.5. Etiology secondary to myeloma.  She is unable to receive PRBCs. Continue aggressive supportive care with Venofer and Retacrit as needed. Ferritin goal 100-400. Hemoglobin goal 11. Continue to monitor. 4. B12 deficiency She receives B12 monthly (last 01/17/2020). 5.   No chemotherapy. 6.   Patient to talk to [place] today re: Revlimid delivery. 7.   RTC in 1 week for MD assess, labs (CBC with diff, CMP, Mg) and day1 of cycle #3 daratumumab.  I discussed the assessment and treatment plan with the patient.  The patient was provided an opportunity to ask questions and all were answered.  The patient agreed with the plan and demonstrated an understanding of the instructions.  The patient was advised to call back if the symptoms worsen or if the condition fails to improve as anticipated.   Lequita Asal, MD, PhD    01/23/2020, 8:30 PM  I, Jacqualyn Posey, am acting as Education administrator for [employer]. Mike Gip, MD, PhD.  I, Davanna He C. Mike Gip, MD, have reviewed the above documentation for accuracy and completeness, and I agree with the above.

## 2020-01-24 ENCOUNTER — Inpatient Hospital Stay: Payer: Medicare HMO

## 2020-01-24 ENCOUNTER — Encounter: Payer: Self-pay | Admitting: Hematology and Oncology

## 2020-01-24 ENCOUNTER — Other Ambulatory Visit: Payer: Self-pay

## 2020-01-24 ENCOUNTER — Other Ambulatory Visit: Payer: Self-pay | Admitting: Hematology and Oncology

## 2020-01-24 ENCOUNTER — Inpatient Hospital Stay: Payer: Medicare HMO | Admitting: Hematology and Oncology

## 2020-01-24 VITALS — BP 131/76 | HR 100 | Temp 97.3°F | Resp 18 | Ht 65.0 in | Wt 214.5 lb

## 2020-01-24 DIAGNOSIS — E538 Deficiency of other specified B group vitamins: Secondary | ICD-10-CM

## 2020-01-24 DIAGNOSIS — C9000 Multiple myeloma not having achieved remission: Secondary | ICD-10-CM | POA: Diagnosis not present

## 2020-01-24 DIAGNOSIS — Z5112 Encounter for antineoplastic immunotherapy: Secondary | ICD-10-CM

## 2020-01-24 DIAGNOSIS — M899 Disorder of bone, unspecified: Secondary | ICD-10-CM

## 2020-01-24 DIAGNOSIS — D649 Anemia, unspecified: Secondary | ICD-10-CM

## 2020-01-24 DIAGNOSIS — D701 Agranulocytosis secondary to cancer chemotherapy: Secondary | ICD-10-CM | POA: Diagnosis not present

## 2020-01-24 DIAGNOSIS — T451X5A Adverse effect of antineoplastic and immunosuppressive drugs, initial encounter: Secondary | ICD-10-CM

## 2020-01-24 DIAGNOSIS — M898X9 Other specified disorders of bone, unspecified site: Secondary | ICD-10-CM

## 2020-01-24 DIAGNOSIS — D539 Nutritional anemia, unspecified: Secondary | ICD-10-CM

## 2020-01-24 LAB — CBC WITH DIFFERENTIAL/PLATELET
Abs Immature Granulocytes: 0.01 10*3/uL (ref 0.00–0.07)
Basophils Absolute: 0 10*3/uL (ref 0.0–0.1)
Basophils Relative: 1 %
Eosinophils Absolute: 0.2 10*3/uL (ref 0.0–0.5)
Eosinophils Relative: 6 %
HCT: 34.4 % — ABNORMAL LOW (ref 36.0–46.0)
Hemoglobin: 11 g/dL — ABNORMAL LOW (ref 12.0–15.0)
Immature Granulocytes: 0 %
Lymphocytes Relative: 19 %
Lymphs Abs: 0.5 10*3/uL — ABNORMAL LOW (ref 0.7–4.0)
MCH: 34.7 pg — ABNORMAL HIGH (ref 26.0–34.0)
MCHC: 32 g/dL (ref 30.0–36.0)
MCV: 108.5 fL — ABNORMAL HIGH (ref 80.0–100.0)
Monocytes Absolute: 0.3 10*3/uL (ref 0.1–1.0)
Monocytes Relative: 10 %
Neutro Abs: 1.7 10*3/uL (ref 1.7–7.7)
Neutrophils Relative %: 64 %
Platelets: 308 10*3/uL (ref 150–400)
RBC: 3.17 MIL/uL — ABNORMAL LOW (ref 3.87–5.11)
RDW: 13.3 % (ref 11.5–15.5)
WBC: 2.6 10*3/uL — ABNORMAL LOW (ref 4.0–10.5)
nRBC: 0 % (ref 0.0–0.2)

## 2020-01-24 LAB — COMPREHENSIVE METABOLIC PANEL
ALT: 16 U/L (ref 0–44)
AST: 15 U/L (ref 15–41)
Albumin: 3.3 g/dL — ABNORMAL LOW (ref 3.5–5.0)
Alkaline Phosphatase: 51 U/L (ref 38–126)
Anion gap: 9 (ref 5–15)
BUN: 18 mg/dL (ref 8–23)
CO2: 25 mmol/L (ref 22–32)
Calcium: 8.7 mg/dL — ABNORMAL LOW (ref 8.9–10.3)
Chloride: 105 mmol/L (ref 98–111)
Creatinine, Ser: 1 mg/dL (ref 0.44–1.00)
GFR calc Af Amer: >60 mL/min (ref >60)
GFR calc non Af Amer: 56 mL/min — ABNORMAL LOW (ref >60)
Glucose, Bld: 115 mg/dL — ABNORMAL HIGH (ref 70–99)
Potassium: 3.8 mmol/L (ref 3.5–5.1)
Sodium: 139 mmol/L (ref 135–145)
Total Bilirubin: 0.4 mg/dL (ref 0.3–1.2)
Total Protein: 7.9 g/dL (ref 6.5–8.1)

## 2020-01-24 LAB — MAGNESIUM: Magnesium: 1.9 mg/dL (ref 1.7–2.4)

## 2020-01-24 NOTE — Progress Notes (Signed)
No new changes noted today 

## 2020-01-25 LAB — KAPPA/LAMBDA LIGHT CHAINS
Kappa free light chain: 578.7 mg/L — ABNORMAL HIGH (ref 3.3–19.4)
Lambda free light chains: <1.5 mg/L — ABNORMAL LOW (ref 5.7–26.3)

## 2020-01-25 LAB — PROTEIN ELECTROPHORESIS, SERUM
A/G Ratio: 0.8 (ref 0.7–1.7)
Albumin ELP: 3.4 g/dL (ref 2.9–4.4)
Alpha-1-Globulin: 0.2 g/dL (ref 0.0–0.4)
Alpha-2-Globulin: 0.5 g/dL (ref 0.4–1.0)
Beta Globulin: 0.7 g/dL (ref 0.7–1.3)
Gamma Globulin: 2.8 g/dL — ABNORMAL HIGH (ref 0.4–1.8)
Globulin, Total: 4.3 g/dL — ABNORMAL HIGH (ref 2.2–3.9)
M-Spike, %: 2.3 g/dL — ABNORMAL HIGH
Total Protein ELP: 7.7 g/dL (ref 6.0–8.5)

## 2020-01-28 DIAGNOSIS — T451X5A Adverse effect of antineoplastic and immunosuppressive drugs, initial encounter: Secondary | ICD-10-CM | POA: Insufficient documentation

## 2020-01-28 DIAGNOSIS — D701 Agranulocytosis secondary to cancer chemotherapy: Secondary | ICD-10-CM | POA: Insufficient documentation

## 2020-01-30 NOTE — Progress Notes (Signed)
[facility]  [address] [place], [place] [address] Phone: [phone]  Fax: [phone]   Clinic Day:  01/31/2020  Referring physician: Denton Lank, MD  Chief Complaint: Laurie [name] is a 74 y.o. female with multiple myeloma who is seen for assessment prior to day 1 of cycle #3 daratumumab, Revlimid and Decadron.   HPI: The patient was last seen in the hematology clinic on 01/24/2020. At that time, she was doing well.  Because of phone issues she had not received her Revlimid.  Hematocrit was 34.4, hemoglobin 11.0, MCV 108.5, platelets 308,000, WBC 2,600 (ANC 1,700).  Creatinine was 1.00 (CrCl 56 ml/min). M spike was 2.3 g/dL. Kappa free light chains were 578.7, lambda free light chains <1.5, ratio >385.80. Chemotherapy was held secondary to leukopenia.  During the interim, she has been "good." She has only taken one nausea pill this week. Her back feels much better and she no longer feels "wobbly." Denies fevers. She is still taking stool softeners.  She took her Singulair this morning.   Past Medical History:  Diagnosis Date  . Anxiety   . Asthma   . Depression   . Head injury   . Osteoporosis     Past Surgical History:  Procedure Laterality Date  . ABDOMINAL HYSTERECTOMY    . HEMORROIDECTOMY    . SEPTOPLASTY      Family History  Problem Relation Age of Onset  . Breast cancer Cousin        mat cousin    Social History:  reports that she has never smoked. She has never used smokeless tobacco. She reports that she does not drink alcohol and does not use drugs. She denies any exposure to radiation or toxins. She denies alcohol and tobacco use. Her husband has schizophrenia and committed suicide. The patient is retired and has not worked since 68. 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  She then worked in the [employer] x 5 years until disability at age 39.  She lives in  [place], [place]. She has a Neurosurgeon named Phoebe.  She is a Sales promotion account executive [employer], and will not accept blood products.  The patient is alone today.  Allergies:  Allergies  Allergen Reactions  . Levofloxacin     Critical   . Other Other (See Comments)    Antihistamines    Current Medications: Current Outpatient Medications  Medication Sig Dispense Refill  . acyclovir (ZOVIRAX) 400 MG tablet Take 1 tablet (400 mg total) by mouth 2 (two) times daily. 60 tablet 11  . albuterol (VENTOLIN HFA) 108 (90 Base) MCG/ACT inhaler 2 inhalations every 4 (four) hours as needed.    Marland Kitchen alendronate (FOSAMAX) 70 MG tablet     . aspirin 81 MG chewable tablet Chew by mouth daily.    Marland Kitchen atorvastatin (LIPITOR) 20 MG tablet Take by mouth.    . busPIRone (BUSPAR) 10 MG tablet Take 10 mg by mouth 2 (two) times daily.    . Calcium Carb-Cholecalciferol (CALCIUM 600 + D PO) Take 1,200 mg by mouth in the morning and at bedtime.     Marland Kitchen dexamethasone (DECADRON) 4 MG tablet 5 TAB BY MOUTH ONCE A WEEK. 5 TAB WEEKLY THE DAY AFTER DARATUMUMAB X 8 WEEKS. TAKE WITH BREAKFAST. 40 tablet 0  . docusate sodium (COLACE) 100 MG capsule Take 100 mg by mouth daily.    . fluticasone-salmeterol (ADVAIR HFA) 230-21 MCG/ACT inhaler Inhale  into the lungs.    Marland Kitchen lenalidomide (REVLIMID) 5 MG capsule Take 1 capsule (5 mg total) by mouth daily. Take 1 tab po for 21 days and 7 days off [employer] Auth # [id number]   Date Obtained 01/04/2020 21 capsule 0  . montelukast (SINGULAIR) 10 MG tablet Take 10 mg by mouth at bedtime.    . nortriptyline (PAMELOR) 25 MG capsule Take by mouth.    Marland Kitchen omeprazole (PRILOSEC) 20 MG capsule Take 20 mg by mouth daily.    . sertraline (ZOLOFT) 100 MG tablet Take 200 mg by mouth daily.     . traZODone (DESYREL) 100 MG tablet Take 25 mg by mouth at bedtime.     . Vitamin D, Ergocalciferol, (DRISDOL) 50000 UNITS CAPS capsule Take 50,000 Units by mouth every 7 (seven) days.    . naproxen (NAPROSYN) 500 MG tablet Take 500 mg by mouth 2  (two) times daily with a meal.    . ondansetron (ZOFRAN) 8 MG tablet Take 1 tablet (8 mg total) by mouth every 8 (eight) hours as needed (Nausea or vomiting). (Patient not taking: Reported on 01/31/2020) 30 tablet 1   No current facility-administered medications for this visit.    Review of Systems  Constitutional: Negative for chills, diaphoresis, fever, malaise/fatigue and weight loss (stable).       Feels "good."  HENT: Negative for congestion, ear discharge, ear pain, hearing loss, nosebleeds, sinus pain, sore throat and tinnitus.   Eyes: Negative for blurred vision and double vision.  Respiratory: Negative for cough, hemoptysis, sputum production and shortness of breath (asthma/heat related).   Cardiovascular: Negative for chest pain, palpitations and leg swelling.  Gastrointestinal: Negative for abdominal pain, blood in stool, constipation (on stool softener), diarrhea, heartburn (on medication), melena, nausea and vomiting.  Genitourinary: Negative for dysuria, frequency, hematuria and urgency.  Musculoskeletal: Negative for back pain, joint pain, myalgias and neck pain.  Skin: Negative for rash.  Neurological: Negative for dizziness, tingling, sensory change, weakness and headaches.       Occasional balance problems. Traumatic brain injury (fall) 3 years ago.   Endo/Heme/Allergies: Does not bruise/bleed easily.  Psychiatric/Behavioral: Negative for depression and memory loss. The patient is not nervous/anxious and does not have insomnia.   All other systems reviewed and are negative.  Performance status (ECOG): 1  Vitals Blood pressure (!) 128/58, pulse 99, temperature 98.7 F (37.1 C), temperature source Tympanic, resp. rate 18, height _0  (1.651 m), weight 214 lb 8.1 oz (97.3 kg), SpO2 99 %.   Physical Exam Constitutional:      General: She is not in acute distress.    Appearance: Normal appearance. She is well-developed.     Interventions: Face mask in place.  HENT:      Head: Normocephalic and atraumatic.     Comments: Shoulder length gray hair.    Mouth/Throat:     Mouth: No oral lesions.  Eyes:     Conjunctiva/sclera: Conjunctivae normal.     Pupils: Pupils are equal, round, and reactive to light.     Comments: Glasses. Blue eyes.  Cardiovascular:     Rate and Rhythm: Normal rate and regular rhythm.     Heart sounds: Normal heart sounds. No murmur heard.  No friction rub. No gallop.   Pulmonary:     Effort: Pulmonary effort is normal.     Breath sounds: Normal breath sounds. No wheezing, rhonchi or rales.  Abdominal:     General: Bowel sounds are normal.  Palpations: Abdomen is soft. There is no hepatomegaly, splenomegaly or mass.     Tenderness: There is no abdominal tenderness.  Musculoskeletal:        General: No tenderness. Normal range of motion.     Cervical back: Normal range of motion and neck supple.  Lymphadenopathy:     Head:     Right side of head: No preauricular, posterior auricular or occipital adenopathy.     Left side of head: No preauricular, posterior auricular or occipital adenopathy.     Cervical: No cervical adenopathy.     Upper Body:     Right upper body: No supraclavicular or axillary adenopathy.     Left upper body: No supraclavicular or axillary adenopathy.     Lower Body: No right inguinal adenopathy. No left inguinal adenopathy.  Skin:    General: Skin is warm and dry.     Findings: No bruising, erythema, lesion or rash.  Neurological:     Mental Status: She is alert and oriented to person, place, and time.  Psychiatric:        Behavior: Behavior normal.        Thought Content: Thought content normal.        Judgment: Judgment normal.    Appointment on 01/31/2020  Component Date Value Ref Range Status  . WBC 01/31/2020 4.7  4.0 - 10.5 K/uL Final  . RBC 01/31/2020 3.50* 3.87 - 5.11 MIL/uL Final  . Hemoglobin 01/31/2020 12.1  12.0 - 15.0 g/dL Final  . HCT 01/31/2020 37.1  36 - 46 % Final  . MCV  01/31/2020 106.0* 80.0 - 100.0 fL Final  . MCH 01/31/2020 34.6* 26.0 - 34.0 pg Final  . MCHC 01/31/2020 32.6  30.0 - 36.0 g/dL Final  . RDW 01/31/2020 13.1  11.5 - 15.5 % Final  . Platelets 01/31/2020 313  150 - 400 K/uL Final  . nRBC 01/31/2020 0.0  0.0 - 0.2 % Final  . Neutrophils Relative % 01/31/2020 66  % Final  . Neutro Abs 01/31/2020 3.1  1.7 - 7.7 K/uL Final  . Lymphocytes Relative 01/31/2020 16  % Final  . Lymphs Abs 01/31/2020 0.7  0.7 - 4.0 K/uL Final  . Monocytes Relative 01/31/2020 7  % Final  . Monocytes Absolute 01/31/2020 0.4  0 - 1 K/uL Final  . Eosinophils Relative 01/31/2020 9  % Final  . Eosinophils Absolute 01/31/2020 0.4  0 - 0 K/uL Final  . Basophils Relative 01/31/2020 2  % Final  . Basophils Absolute 01/31/2020 0.1  0 - 0 K/uL Final  . Immature Granulocytes 01/31/2020 0  % Final  . Abs Immature Granulocytes 01/31/2020 0.01  0.00 - 0.07 K/uL Final   Performed at [facility], [address] [place], [place] [address]    Assessment:  Laurie [name] is a 74 y.o. female with stage III biclonal IgA multiple myeloma.  SPEP was 5.0 gm/dL on 10/03/2019. Random urine revealed 196.6 mg/dL total protein with 58.3% M-spike.    Bone marrow biopsy on 11/13/2019 revealed a hypercellular bone marrow with extensive involvement by plasma cell neoplasm. Atypical plasma cells represented 70% of all cells in the aspirate and was associated with prominent interstitial infiltrates and diffuse sheets in the clot.  Plasma cells were kappa light chain restricted.   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  FISH revealed a gain of the long arm of chromosome  1 (CKS1B)(3R2G, 50%, normal < 8.6%) and chromosome 11q/11 (3R, 56%, not observed in validation studies).     Labs on 10/03/2019 revealed a hematocrit 27.9, hemoglobin 9.4, MCV 108, platelets 287,000, WBC  3,800 (ANC 2400).   Creatinine was 0.94, albumin 3.4, and protein 11.2.  Ferritin was 82 with an iron saturation of 30% and a TIBC of 206.  Hepatitis C antibody and HIV testing were negative.    Work-up on 10/26/2019 revealed a hematocrit was 27.4, hemoglobin 9.1, MCV 109.6, platelets 250,000, WBC 3,400 (ANC 2,100).  Retic was 2.2%.  Creatinine was 1.06 and calcium 8.6. Total protein was 11.4 and albumin 3.2.  M-spike was 5.7 gm/dL biclonal IgA protein with kappa specificity.  Kappa free light chain were 1,376.3, lambda free light chains <1.5, and ratio > 917.53 (0.26-1.65).  Beta 2 microglobulin was 10.3 (0.6 - 2.4). LDH was 102. TSH was 3.673.  Hepatitis serologies were negative on 11/28/2019.  24 hour UPEP on 10/30/2019 revealed 1020 mg/24 hours protein with 65.3% M-spike (665.9 mg/24 hours).  Urine free kappa light chain were 2,620.44, free lambda light chain 6.01 and ratio 436.01 (1.03-31.76).  Bone survey on 10/26/2019 revealed small lytic lesions in the skull. There was no acute fracture.  SPEP has been followed (gm/dL): 5.7 on 10/26/2019, 3.5 on 12/27/2019, and 2.3 on 01/24/2020. Kappa free light chains have been followed (mg/L): 1376.3 on 10/26/2019, 768 on 01/03/2020, and 578.7 on 01/24/2020.  She is day 1 of cycle #3 daratumumab SQ and Decadron (11/29/2019 - 01/31/2020).  She began monthly Xgeva on 12/05/2019 (last 01/03/2020).  She has B12 deficiency. Vitamin B12 was 200 (low) and folate >20.0 on 10/03/2019.  She was on weekly B12 injections x 3 and is now on monthly B12 injections (last 01/17/2020).  Antiparietal antibody and intrinsic factor antibodies were negative on 12/05/2019.  She is a Restaurant manager, fast food.  She receives Retacrit (last 12/13/2019) and IV iron support.  Ferritin was 60 on 01/10/2020.  She received Venofer on 12/05/2019, 12/13/2019, and 01/17/2020.  She received both doses of the COVID-19 vaccine in 08/2019.  Symptomatically, she feels "good".  Exam is  stable.  Plan: 1.   Labs today: CBC with diff, CMP, Mg. 2.   Stage III multiple myeloma Patient was diagnosed with a 5.7 gm/dL biclonal IgA gammopathy with kappa specificity.                         M-spike was 3.5 on 12/27/2019. Kappa free light chains were 1,376.3 with a ratio >917.53 (0.26-1.65) on 10/26/2019.                          Kappa free light chains were 578.7 on 01/24/2020. Bone survey reveals lytic lesions in skull. She receives [address] (cycle length 28 days). Daratumumab SQ 1800 mg on days 1 and 15 for cycles #3 - #6 Revlimid 5 mg po day 1-21. Decadron 20 mg po days 1-2, and 15-16. Prophylaxis: Singulair 10 mg po the day before, the day of and 2 days after daratumumab. Acyclovir 400 mg po BID. Aspirin 81 mg a day. Supportive care: She receives Xgeva monthly (last 01/03/2020).   Xgeva today.             Labs reviewed. Day 1 of cycle #3 daratumumab today.                         Begin  Revlimid.              Discuss trend in counts.  WBC 4700 ([place] 3100).               Discuss symptom management.  She has antiemetics and pain medications at home to use on a prn bases.  Interventions are adequate.  3. Anemia Hematocrit 37.1. Hemoglobin 12.1. MCV 106.0. Etiology secondary to myeloma.  She is unable to receive PRBCs. She continues aggressive supportive care with Venofer and Retacrit as needed. Ferritin goal 100-400. Hemoglobin goal 11. Continue to monitor. 4. B12 deficiency She receives B12 monthly (last 01/17/2020).  Continue B12 monthly. 5.   Day 1 of cycle #1 daratumumab today. 6.   Xegva today. 7.    RTC in 2 weeks for MD assessment, labs (CBC with diff, CMP), and day 15 of cycle #3 daratumumab.  I discussed the assessment and treatment plan with the patient.  The patient was provided an opportunity to ask questions and all were answered.  The patient agreed with the plan and demonstrated an understanding of the instructions.  The patient was advised to call back if the symptoms worsen or if the condition fails to improve as anticipated.   Lequita Asal, MD, PhD    01/31/2020, 9:35 AM  I, Mirian Mo Tufford, am acting as Education administrator for [employer]. Mike Gip, MD, PhD.  I, Smitty Ackerley C. Mike Gip, MD, have reviewed the above documentation for accuracy and completeness, and I agree with the above.

## 2020-01-31 ENCOUNTER — Inpatient Hospital Stay: Payer: Medicare HMO

## 2020-01-31 ENCOUNTER — Inpatient Hospital Stay: Payer: Medicare HMO | Admitting: Hematology and Oncology

## 2020-01-31 ENCOUNTER — Encounter: Payer: Self-pay | Admitting: Hematology and Oncology

## 2020-01-31 ENCOUNTER — Other Ambulatory Visit: Payer: Self-pay

## 2020-01-31 VITALS — BP 146/81 | HR 88 | Resp 18

## 2020-01-31 VITALS — BP 128/58 | HR 99 | Temp 98.7°F | Resp 18 | Ht 65.0 in | Wt 214.5 lb

## 2020-01-31 DIAGNOSIS — Z5112 Encounter for antineoplastic immunotherapy: Secondary | ICD-10-CM

## 2020-01-31 DIAGNOSIS — C9000 Multiple myeloma not having achieved remission: Secondary | ICD-10-CM | POA: Diagnosis not present

## 2020-01-31 DIAGNOSIS — D649 Anemia, unspecified: Secondary | ICD-10-CM

## 2020-01-31 DIAGNOSIS — E538 Deficiency of other specified B group vitamins: Secondary | ICD-10-CM

## 2020-01-31 LAB — CBC WITH DIFFERENTIAL/PLATELET
Abs Immature Granulocytes: 0.01 10*3/uL (ref 0.00–0.07)
Basophils Absolute: 0.1 10*3/uL (ref 0.0–0.1)
Basophils Relative: 2 %
Eosinophils Absolute: 0.4 10*3/uL (ref 0.0–0.5)
Eosinophils Relative: 9 %
HCT: 37.1 % (ref 36.0–46.0)
Hemoglobin: 12.1 g/dL (ref 12.0–15.0)
Immature Granulocytes: 0 %
Lymphocytes Relative: 16 %
Lymphs Abs: 0.7 10*3/uL (ref 0.7–4.0)
MCH: 34.6 pg — ABNORMAL HIGH (ref 26.0–34.0)
MCHC: 32.6 g/dL (ref 30.0–36.0)
MCV: 106 fL — ABNORMAL HIGH (ref 80.0–100.0)
Monocytes Absolute: 0.4 10*3/uL (ref 0.1–1.0)
Monocytes Relative: 7 %
Neutro Abs: 3.1 10*3/uL (ref 1.7–7.7)
Neutrophils Relative %: 66 %
Platelets: 313 10*3/uL (ref 150–400)
RBC: 3.5 MIL/uL — ABNORMAL LOW (ref 3.87–5.11)
RDW: 13.1 % (ref 11.5–15.5)
WBC: 4.7 10*3/uL (ref 4.0–10.5)
nRBC: 0 % (ref 0.0–0.2)

## 2020-01-31 LAB — COMPREHENSIVE METABOLIC PANEL
ALT: 16 U/L (ref 0–44)
AST: 20 U/L (ref 15–41)
Albumin: 3.3 g/dL — ABNORMAL LOW (ref 3.5–5.0)
Alkaline Phosphatase: 55 U/L (ref 38–126)
Anion gap: 9 (ref 5–15)
BUN: 22 mg/dL (ref 8–23)
CO2: 25 mmol/L (ref 22–32)
Calcium: 8.8 mg/dL — ABNORMAL LOW (ref 8.9–10.3)
Chloride: 102 mmol/L (ref 98–111)
Creatinine, Ser: 1.06 mg/dL — ABNORMAL HIGH (ref 0.44–1.00)
GFR calc Af Amer: >60 mL/min (ref >60)
GFR calc non Af Amer: 52 mL/min — ABNORMAL LOW (ref >60)
Glucose, Bld: 88 mg/dL (ref 70–99)
Potassium: 3.9 mmol/L (ref 3.5–5.1)
Sodium: 136 mmol/L (ref 135–145)
Total Bilirubin: 0.7 mg/dL (ref 0.3–1.2)
Total Protein: 8 g/dL (ref 6.5–8.1)

## 2020-01-31 LAB — MAGNESIUM: Magnesium: 2 mg/dL (ref 1.7–2.4)

## 2020-01-31 MED ORDER — DENOSUMAB 120 MG/1.7ML ~~LOC~~ SOLN
120.0000 mg | Freq: Once | SUBCUTANEOUS | Status: AC
  Administered 2020-01-31: 120 mg via SUBCUTANEOUS
  Filled 2020-01-31: qty 1.7

## 2020-01-31 MED ORDER — DARATUMUMAB-HYALURONIDASE-FIHJ 1800-30000 MG-UT/15ML ~~LOC~~ SOLN
1800.0000 mg | Freq: Once | SUBCUTANEOUS | Status: AC
  Administered 2020-01-31: 1800 mg via SUBCUTANEOUS
  Filled 2020-01-31: qty 15

## 2020-01-31 MED ORDER — DEXAMETHASONE 4 MG PO TABS
20.0000 mg | ORAL_TABLET | Freq: Once | ORAL | Status: AC
  Administered 2020-01-31: 20 mg via ORAL
  Filled 2020-01-31: qty 5

## 2020-01-31 MED ORDER — ACETAMINOPHEN 325 MG PO TABS
650.0000 mg | ORAL_TABLET | Freq: Once | ORAL | Status: AC
  Administered 2020-01-31: 650 mg via ORAL
  Filled 2020-01-31: qty 2

## 2020-01-31 MED ORDER — ONDANSETRON HCL 4 MG PO TABS
8.0000 mg | ORAL_TABLET | Freq: Once | ORAL | Status: AC
  Administered 2020-01-31: 8 mg via ORAL
  Filled 2020-01-31: qty 2

## 2020-01-31 MED ORDER — DIPHENHYDRAMINE HCL 25 MG PO CAPS
50.0000 mg | ORAL_CAPSULE | Freq: Once | ORAL | Status: AC
  Administered 2020-01-31: 50 mg via ORAL
  Filled 2020-01-31: qty 2

## 2020-01-31 NOTE — Progress Notes (Signed)
No new changes noted today 

## 2020-02-13 NOTE — Progress Notes (Signed)
[facility]  [address], [address] [place], [place] [address] Phone: [phone]  Fax: [phone]   Clinic Day:  02/14/2020  Referring physician: Denton Lank, MD  Chief Complaint: Laurie [name] is a 74 y.o. female with multiple myeloma who is seen for assessment prior to day 15 of cycle #3 daratumumab, Revlimid and Decadron.   HPI: The patient was last seen in the hematology clinic on 01/31/2020. At that time, she felt "good." Exam was stable. Hematocrit was 37.1, hemoglobin 12.1, platelets 313,000, WBC 4,700. Creatinine was 1.06 (CrCl 52 ml/min). Calcium was 8.8. Albumin was 3.3.  She received day 1 of cycle #3 daratumumab, Revlimid and Decadron.  She received Xgeva.  During the interim, she has been "pretty good." Over the weekend, she was very fatigued. She denies nausea, vomiting, mouth sores, mouth tenderness, and chest pain. Her asthma and shortness of breath are a lot better.  She has been taking her Revlimid, Singulair, Acyclovir, and aspirin as prescribed.   Past Medical History:  Diagnosis Date  . Anxiety   . Asthma   . Depression   . Head injury   . Osteoporosis     Past Surgical History:  Procedure Laterality Date  . ABDOMINAL HYSTERECTOMY    . HEMORROIDECTOMY    . SEPTOPLASTY      Family History  Problem Relation Age of Onset  . Breast cancer Cousin        mat cousin    Social History:  reports that she has never smoked. She has never used smokeless tobacco. She reports that she does not drink alcohol and does not use drugs. She denies any exposure to radiation or toxins. She denies alcohol and tobacco use. Her husband has schizophrenia and committed suicide. The patient is retired and has not worked since 55. 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  She then worked in the [employer] x 5 years until disability at age 34.  She lives in [place], [place]. She has a Neurosurgeon named  Phoebe.  She is a Sales promotion account executive [employer], and will not accept blood products.  The patient is alone today.  Allergies:  Allergies  Allergen Reactions  . Levofloxacin     Critical   . Other Other (See Comments)    Antihistamines    Current Medications: Current Outpatient Medications  Medication Sig Dispense Refill  . acyclovir (ZOVIRAX) 400 MG tablet Take 1 tablet (400 mg total) by mouth 2 (two) times daily. 60 tablet 11  . albuterol (VENTOLIN HFA) 108 (90 Base) MCG/ACT inhaler 2 inhalations every 4 (four) hours as needed.    Marland Kitchen alendronate (FOSAMAX) 70 MG tablet     . aspirin 81 MG chewable tablet Chew by mouth daily.    Marland Kitchen atorvastatin (LIPITOR) 20 MG tablet Take by mouth.    . busPIRone (BUSPAR) 10 MG tablet Take 10 mg by mouth 2 (two) times daily.    . Calcium Carb-Cholecalciferol (CALCIUM 600 + D PO) Take 1,200 mg by mouth in the morning and at bedtime.     Marland Kitchen dexamethasone (DECADRON) 4 MG tablet 5 TAB BY MOUTH ONCE A WEEK. 5 TAB WEEKLY THE DAY AFTER DARATUMUMAB X 8 WEEKS. TAKE WITH BREAKFAST. 40 tablet 0  . docusate sodium (COLACE) 100 MG capsule Take 100 mg by mouth daily.    . fluticasone-salmeterol (ADVAIR HFA) 230-21 MCG/ACT inhaler Inhale into the lungs.    Marland Kitchen lenalidomide (REVLIMID) 5  MG capsule Take 1 capsule (5 mg total) by mouth daily. Take 1 tab po for 21 days and 7 days off [employer] Auth # [id number]   Date Obtained 01/04/2020 21 capsule 0  . montelukast (SINGULAIR) 10 MG tablet Take 10 mg by mouth at bedtime.    . naproxen (NAPROSYN) 500 MG tablet Take 500 mg by mouth 2 (two) times daily with a meal.    . nortriptyline (PAMELOR) 25 MG capsule Take by mouth.    Marland Kitchen omeprazole (PRILOSEC) 20 MG capsule Take 20 mg by mouth daily.    . ondansetron (ZOFRAN) 8 MG tablet Take 1 tablet (8 mg total) by mouth every 8 (eight) hours as needed (Nausea or vomiting). 30 tablet 1  . sertraline (ZOLOFT) 100 MG tablet Take 200 mg by mouth daily.     . traZODone (DESYREL) 100 MG tablet Take 25 mg by  mouth at bedtime.     . Vitamin D, Ergocalciferol, (DRISDOL) 50000 UNITS CAPS capsule Take 50,000 Units by mouth every 7 (seven) days.     No current facility-administered medications for this visit.    Review of Systems  Constitutional: Positive for malaise/fatigue (over the weekend) and weight loss (3 lbs). Negative for chills, diaphoresis and fever.       Feels "pretty good."  HENT: Negative for congestion, ear discharge, ear pain, hearing loss, nosebleeds, sinus pain, sore throat and tinnitus.   Eyes: Negative for blurred vision and double vision.  Respiratory: Negative for cough, hemoptysis, sputum production and shortness of breath (asthma/heat related).   Cardiovascular: Negative for chest pain, palpitations and leg swelling.  Gastrointestinal: Negative for abdominal pain, blood in stool, constipation (on stool softener), diarrhea, heartburn (on medication), melena, nausea and vomiting.  Genitourinary: Negative for dysuria, frequency, hematuria and urgency.  Musculoskeletal: Negative for back pain, joint pain, myalgias and neck pain.  Skin: Negative for itching and rash.  Neurological: Negative for dizziness, tingling, sensory change, weakness and headaches.       Occasional balance problems. Traumatic brain injury (fall) 3 years ago.   Endo/Heme/Allergies: Does not bruise/bleed easily.  Psychiatric/Behavioral: Negative for depression and memory loss. The patient is not nervous/anxious and does not have insomnia.   All other systems reviewed and are negative.  Performance status (ECOG): 1  Vitals Blood pressure 135/63, pulse (!) 113, temperature (!) 96.2 F (35.7 C), temperature source Tympanic, resp. rate 18, weight 211 lb 10.3 oz (96 kg), SpO2 100 %.   Physical Exam Vitals and nursing note reviewed.  Constitutional:      General: She is not in acute distress.    Appearance: Normal appearance. She is well-developed.     Interventions: Face mask in place.  HENT:     Head:  Normocephalic and atraumatic.     Comments: Shoulder length gray hair.    Mouth/Throat:     Mouth: Mucous membranes are moist. No oral lesions.     Pharynx: Oropharynx is clear.  Eyes:     Extraocular Movements: Extraocular movements intact.     Conjunctiva/sclera: Conjunctivae normal.     Pupils: Pupils are equal, round, and reactive to light.     Comments: Glasses. Blue eyes.  Cardiovascular:     Rate and Rhythm: Normal rate and regular rhythm.     Heart sounds: Normal heart sounds. No murmur heard.  No friction rub. No gallop.   Pulmonary:     Effort: Pulmonary effort is normal. No respiratory distress.     Breath sounds: Normal breath  sounds. No wheezing or rales.  Chest:     Chest wall: No tenderness.  Abdominal:     General: Bowel sounds are normal. There is no distension.     Palpations: Abdomen is soft. There is no hepatomegaly, splenomegaly or mass.     Tenderness: There is no abdominal tenderness. There is no guarding or rebound.  Musculoskeletal:        General: No swelling or tenderness. Normal range of motion.     Cervical back: Normal range of motion and neck supple.  Lymphadenopathy:     Head:     Right side of head: No preauricular, posterior auricular or occipital adenopathy.     Left side of head: No preauricular, posterior auricular or occipital adenopathy.     Cervical: No cervical adenopathy.     Upper Body:     Right upper body: No supraclavicular or axillary adenopathy.     Left upper body: No supraclavicular or axillary adenopathy.     Lower Body: No right inguinal adenopathy. No left inguinal adenopathy.  Skin:    General: Skin is warm and dry.     Findings: No bruising, erythema, lesion or rash.  Neurological:     Mental Status: She is alert and oriented to person, place, and time.  Psychiatric:        Behavior: Behavior normal.        Thought Content: Thought content normal.        Judgment: Judgment normal.    Appointment on 02/14/2020    Component Date Value Ref Range Status  . Sodium 02/14/2020 137  135 - 145 mmol/L Final  . Potassium 02/14/2020 3.7  3.5 - 5.1 mmol/L Final  . Chloride 02/14/2020 102  98 - 111 mmol/L Final  . CO2 02/14/2020 24  22 - 32 mmol/L Final  . Glucose, Bld 02/14/2020 130* 70 - 99 mg/dL Final   Glucose reference range applies only to samples taken after fasting for at least 8 hours.  . BUN 02/14/2020 19  8 - 23 mg/dL Final  . Creatinine, Ser 02/14/2020 1.07* 0.44 - 1.00 mg/dL Final  . Calcium 02/14/2020 8.6* 8.9 - 10.3 mg/dL Final  . Total Protein 02/14/2020 7.9  6.5 - 8.1 g/dL Final  . Albumin 02/14/2020 3.6  3.5 - 5.0 g/dL Final  . AST 02/14/2020 18  15 - 41 U/L Final  . ALT 02/14/2020 27  0 - 44 U/L Final  . Alkaline Phosphatase 02/14/2020 69  38 - 126 U/L Final  . Total Bilirubin 02/14/2020 PENDING  0.3 - 1.2 mg/dL Incomplete  . GFR, Estimated 02/14/2020 52* >60 mL/min Final  . Anion gap 02/14/2020 11  5 - 15 Final   Performed at [facility], [address] [place],  [address]  . WBC 02/14/2020 6.0  4.0 - 10.5 K/uL Final  . RBC 02/14/2020 3.90  3.87 - 5.11 MIL/uL Final  . Hemoglobin 02/14/2020 13.2  12.0 - 15.0 g/dL Final  . HCT 02/14/2020 40.7  36 - 46 % Final  . MCV 02/14/2020 104.4* 80.0 - 100.0 fL Final  . MCH 02/14/2020 33.8  26.0 - 34.0 pg Final  . MCHC 02/14/2020 32.4  30.0 - 36.0 g/dL Final  . RDW 02/14/2020 12.9  11.5 - 15.5 % Final  . Platelets 02/14/2020 282  150 - 400 K/uL Final  . nRBC 02/14/2020 0.0  0.0 - 0.2 % Final  . Neutrophils Relative % 02/14/2020 54  % Final  . Neutro  Abs 02/14/2020 3.2  1.7 - 7.7 K/uL Final  . Lymphocytes Relative 02/14/2020 14  % Final  . Lymphs Abs 02/14/2020 0.8  0.7 - 4.0 K/uL Final  . Monocytes Relative 02/14/2020 7  % Final  . Monocytes Absolute 02/14/2020 0.4  0.1 - 1.0 K/uL Final  . Eosinophils Relative 02/14/2020 24  % Final  . Eosinophils Absolute 02/14/2020 1.5* 0.0 - 0.5 K/uL Final  . Basophils Relative  02/14/2020 1  % Final  . Basophils Absolute 02/14/2020 0.1  0.0 - 0.1 K/uL Final  . Immature Granulocytes 02/14/2020 0  % Final  . Abs Immature Granulocytes 02/14/2020 0.02  0.00 - 0.07 K/uL Final   Performed at [facility], [address] [place], [place] [address]    Assessment:  KETSIA LINEBAUGH is a 74 y.o. female with stage III biclonal IgA multiple myeloma.  SPEP was 5.0 gm/dL on 10/03/2019. Random urine revealed 196.6 mg/dL total protein with 58.3% M-spike.    Bone marrow biopsy on 11/13/2019 revealed a hypercellular bone marrow with extensive involvement by plasma cell neoplasm. Atypical plasma cells represented 70% of all cells in the aspirate and was associated with prominent interstitial infiltrates and diffuse sheets in the clot.  Plasma cells were kappa light chain restricted.   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     Labs on 10/03/2019 revealed a hematocrit 27.9, hemoglobin 9.4, MCV 108, platelets 287,000, WBC 3,800 (ANC 2400).   Creatinine was 0.94, albumin 3.4, and protein 11.2.  Ferritin was 82 with an iron saturation of 30% and a TIBC of 206.  Hepatitis C antibody and HIV testing were negative.    Work-up on 10/26/2019 revealed a hematocrit was 27.4, hemoglobin 9.1, MCV 109.6, platelets 250,000, WBC 3,400 (ANC 2,100).  Retic was 2.2%.  Creatinine was 1.06 and calcium 8.6. Total protein was 11.4 and albumin 3.2.  M-spike was 5.7 gm/dL biclonal IgA protein with kappa specificity.  Kappa free light chain were 1,376.3, lambda free light chains <1.5, and ratio > 917.53 (0.26-1.65).  Beta 2 microglobulin was 10.3 (0.6 - 2.4). LDH was 102. TSH was 3.673.  Hepatitis serologies were negative on  11/28/2019.  24 hour UPEP on 10/30/2019 revealed 1020 mg/24 hours protein with 65.3% M-spike (665.9 mg/24 hours).  Urine free kappa light chain were 2,620.44, free lambda light chain 6.01 and ratio 436.01 (1.03-31.76).  Bone survey on 10/26/2019 revealed small lytic lesions in the skull. There was no acute fracture.  SPEP has been followed (gm/dL): 5.7 on 10/26/2019, 3.5 on 12/27/2019, and 2.3 on 01/24/2020. Kappa free light chains have been followed (mg/L): 1376.3 on 10/26/2019, 768 on 01/03/2020, and 578.7 on 01/24/2020.  She is day 15 of cycle #3 daratumumab SQ and Decadron (11/29/2019 - 01/31/2020).  She began monthly Xgeva on 12/05/2019 (last 01/31/2020).  She has B12 deficiency. Vitamin B12 was 200 (low) and folate >20.0 on 10/03/2019.  She was on weekly B12 injections x 3 and is now on monthly B12 injections (last 01/17/2020).  Antiparietal antibody and intrinsic factor antibodies were negative on 12/05/2019.  She is a Restaurant manager, fast food.  She receives Retacrit (last 12/13/2019) and IV iron support.  Ferritin was 60 on 01/10/2020.  She received Venofer on 12/05/2019, 12/13/2019, and 01/17/2020.  She  received both doses of the COVID-19 vaccine in 08/2019.  Symptomatically, she feels "pretty good"  Today.  Over the weekend, she was very fatigued. Exam is stable.  Plan: 1.   Labs today: CBC with diff, CMP. 2.   Stage III multiple myeloma Patient was diagnosed with a 5.7 gm/dL biclonal IgA gammopathy with kappa specificity.                         M-spike was 3.5 on 12/27/2019.   M spike was 2.3 on 01/24/2020. Kappa free light chains were 1,376.3 with a ratio >917.53 (0.26-1.65) on 10/26/2019.                          Kappa free light chains were 578.7 on 01/24/2020. Bone survey reveals lytic lesions in skull. She receives [address] (cycle length 28 days). Daratumumab SQ 1800 mg on days 1 and 15 for cycles  #3 - #6 Revlimid 5 mg po day 1-21. Decadron 20 mg po days 1-2, and 15-16. Prophylaxis: Singulair 10 mg po the day before, the day of and 2 days after daratumumab. Acyclovir 400 mg po BID. Aspirin 81 mg a day. Supportive care:   She receives monthly Xgeva (last 01/31/2020).            Labs reviewed.  Day 15 of cycle #3 daratumumab today.                        Continue Revlimid.              Discuss symptom management.  She has antiemetics at home to use on a prn bases.  Interventions are adequate.   .  3. Anemia, resolved Hematocrit 40.7. Hemoglobin 13.2. MCV 104.4. Etiology secondary to myeloma.  She is unable to receive PRBCs. She continues aggressive supportive care with Venofer and Retacrit as needed. Ferritin goal 100-400. Hemoglobin goal 11. She is at risk for recurrent anemia secondary to ongoing chemotherapy.  Continue to monitor closely 4. B12 deficiency She receives B12 monthly (last 01/17/2020).  B12 today and monthly x6. 5.   Day 15 of cycle #3 daratumumab. 6.   RTC in 2 weeks for MD assessment, labs (CBC with diff, CMP, Mg, ferritin, iron studies, SPEP, FLCA), Xgeva, and day 1 of cycle #4 daratumumab.  I discussed the assessment and treatment plan with the patient.  The patient was provided an opportunity to ask questions and all were answered.  The patient agreed with the plan and demonstrated an understanding of the instructions.  The patient was advised to call back if the symptoms worsen or if the condition fails to improve as anticipated.   Laurie Asal, MD, PhD    02/14/2020, 9:37 AM  I, Laurie [name], am acting as Education administrator for [employer]. Laurie Gip, MD, PhD.  I, Laurie Jeremiah C. Laurie Gip, MD,  have reviewed the above documentation for accuracy and completeness, and I agree with the above.

## 2020-02-14 ENCOUNTER — Inpatient Hospital Stay: Payer: Medicare HMO | Attending: Hematology and Oncology

## 2020-02-14 ENCOUNTER — Inpatient Hospital Stay: Payer: Medicare HMO

## 2020-02-14 ENCOUNTER — Inpatient Hospital Stay: Payer: Medicare HMO | Admitting: Hematology and Oncology

## 2020-02-14 ENCOUNTER — Encounter: Payer: Self-pay | Admitting: Hematology and Oncology

## 2020-02-14 ENCOUNTER — Other Ambulatory Visit: Payer: Self-pay

## 2020-02-14 VITALS — BP 113/69 | HR 96

## 2020-02-14 VITALS — BP 135/63 | HR 113 | Temp 96.2°F | Resp 18 | Wt 211.6 lb

## 2020-02-14 DIAGNOSIS — D649 Anemia, unspecified: Secondary | ICD-10-CM

## 2020-02-14 DIAGNOSIS — C9000 Multiple myeloma not having achieved remission: Secondary | ICD-10-CM | POA: Insufficient documentation

## 2020-02-14 DIAGNOSIS — M899 Disorder of bone, unspecified: Secondary | ICD-10-CM | POA: Diagnosis not present

## 2020-02-14 DIAGNOSIS — Z5112 Encounter for antineoplastic immunotherapy: Secondary | ICD-10-CM

## 2020-02-14 DIAGNOSIS — E538 Deficiency of other specified B group vitamins: Secondary | ICD-10-CM | POA: Diagnosis present

## 2020-02-14 DIAGNOSIS — M898X9 Other specified disorders of bone, unspecified site: Secondary | ICD-10-CM

## 2020-02-14 LAB — CBC WITH DIFFERENTIAL/PLATELET
Abs Immature Granulocytes: 0.02 10*3/uL (ref 0.00–0.07)
Basophils Absolute: 0.1 10*3/uL (ref 0.0–0.1)
Basophils Relative: 1 %
Eosinophils Absolute: 1.5 10*3/uL — ABNORMAL HIGH (ref 0.0–0.5)
Eosinophils Relative: 24 %
HCT: 40.7 % (ref 36.0–46.0)
Hemoglobin: 13.2 g/dL (ref 12.0–15.0)
Immature Granulocytes: 0 %
Lymphocytes Relative: 14 %
Lymphs Abs: 0.8 10*3/uL (ref 0.7–4.0)
MCH: 33.8 pg (ref 26.0–34.0)
MCHC: 32.4 g/dL (ref 30.0–36.0)
MCV: 104.4 fL — ABNORMAL HIGH (ref 80.0–100.0)
Monocytes Absolute: 0.4 10*3/uL (ref 0.1–1.0)
Monocytes Relative: 7 %
Neutro Abs: 3.2 10*3/uL (ref 1.7–7.7)
Neutrophils Relative %: 54 %
Platelets: 282 10*3/uL (ref 150–400)
RBC: 3.9 MIL/uL (ref 3.87–5.11)
RDW: 12.9 % (ref 11.5–15.5)
WBC: 6 10*3/uL (ref 4.0–10.5)
nRBC: 0 % (ref 0.0–0.2)

## 2020-02-14 LAB — COMPREHENSIVE METABOLIC PANEL
ALT: 27 U/L (ref 0–44)
AST: 18 U/L (ref 15–41)
Albumin: 3.6 g/dL (ref 3.5–5.0)
Alkaline Phosphatase: 69 U/L (ref 38–126)
Anion gap: 11 (ref 5–15)
BUN: 19 mg/dL (ref 8–23)
CO2: 24 mmol/L (ref 22–32)
Calcium: 8.6 mg/dL — ABNORMAL LOW (ref 8.9–10.3)
Chloride: 102 mmol/L (ref 98–111)
Creatinine, Ser: 1.07 mg/dL — ABNORMAL HIGH (ref 0.44–1.00)
GFR, Estimated: 52 mL/min — ABNORMAL LOW (ref >60)
Glucose, Bld: 130 mg/dL — ABNORMAL HIGH (ref 70–99)
Potassium: 3.7 mmol/L (ref 3.5–5.1)
Sodium: 137 mmol/L (ref 135–145)
Total Bilirubin: 0.4 mg/dL (ref 0.3–1.2)
Total Protein: 7.9 g/dL (ref 6.5–8.1)

## 2020-02-14 MED ORDER — DEXAMETHASONE 4 MG PO TABS
20.0000 mg | ORAL_TABLET | Freq: Once | ORAL | Status: AC
  Administered 2020-02-14: 20 mg via ORAL
  Filled 2020-02-14: qty 5

## 2020-02-14 MED ORDER — ONDANSETRON HCL 4 MG PO TABS
8.0000 mg | ORAL_TABLET | Freq: Once | ORAL | Status: AC
  Administered 2020-02-14: 8 mg via ORAL
  Filled 2020-02-14: qty 2

## 2020-02-14 MED ORDER — CYANOCOBALAMIN 1000 MCG/ML IJ SOLN
1000.0000 ug | Freq: Once | INTRAMUSCULAR | Status: AC
  Administered 2020-02-14: 1000 ug via INTRAMUSCULAR

## 2020-02-14 MED ORDER — DARATUMUMAB-HYALURONIDASE-FIHJ 1800-30000 MG-UT/15ML ~~LOC~~ SOLN
1800.0000 mg | Freq: Once | SUBCUTANEOUS | Status: AC
  Administered 2020-02-14: 1800 mg via SUBCUTANEOUS
  Filled 2020-02-14: qty 15

## 2020-02-14 MED ORDER — CYANOCOBALAMIN 1000 MCG/ML IJ SOLN
INTRAMUSCULAR | Status: AC
  Filled 2020-02-14: qty 1

## 2020-02-14 MED ORDER — ACETAMINOPHEN 325 MG PO TABS
650.0000 mg | ORAL_TABLET | Freq: Once | ORAL | Status: AC
  Administered 2020-02-14: 650 mg via ORAL
  Filled 2020-02-14: qty 2

## 2020-02-14 MED ORDER — DIPHENHYDRAMINE HCL 25 MG PO CAPS
50.0000 mg | ORAL_CAPSULE | Freq: Once | ORAL | Status: AC
  Administered 2020-02-14: 50 mg via ORAL
  Filled 2020-02-14: qty 2

## 2020-02-14 NOTE — Progress Notes (Signed)
Some occasional nausea and constipation. She felt bad on Sunday.

## 2020-02-19 ENCOUNTER — Ambulatory Visit: Payer: Medicare HMO

## 2020-02-23 ENCOUNTER — Other Ambulatory Visit: Payer: Self-pay | Admitting: *Deleted

## 2020-02-23 ENCOUNTER — Other Ambulatory Visit: Payer: Self-pay

## 2020-02-23 DIAGNOSIS — C9000 Multiple myeloma not having achieved remission: Secondary | ICD-10-CM

## 2020-02-23 MED ORDER — LENALIDOMIDE 5 MG PO CAPS: 5.0000 mg | ORAL_CAPSULE | Freq: Every day | ORAL | 0 refills | Status: DC

## 2020-02-27 NOTE — Progress Notes (Signed)
[facility]  [address], [address] [place], [place] [address] Phone: [phone]  Fax: [phone]   Clinic Day:  02/28/2020  Referring physician: Denton Lank, MD  Chief Complaint: Laurie [name] is a 74 y.o. female with multiple myeloma who is seen for assessment prior to day 1 of cycle #4 daratumumab, Revlimid and Decadron.   HPI: The patient was last seen in the hematology clinic on 02/14/2020. At that time, she felt "pretty good". Hematocrit was 40.7, hemoglobin 13.2, platelets 282,000, WBC 6,000. Creatinine was 1.07 (CrCl 52 ml/min). Calcium was 8.6. She received day 15 of cycle #3 daratumumab and her monthly vitamin B12 injection.   During the interim, she has been "ok".  A couple of weeks ago, she felt weak and nauseous for about 4 days but is feeling better now. She took her nausea medication, which helped. Denies shortness of breath. Her bowels are the same.   She has not received her Revlimid yet. She is supposed to start tomorrow. She takes Calcium 600 mg BID. She has been taking her Acyclovir and Singulair daily. She denies any dental problems.   Past Medical History:  Diagnosis Date  . Anxiety   . Asthma   . Depression   . Head injury   . Osteoporosis     Past Surgical History:  Procedure Laterality Date  . ABDOMINAL HYSTERECTOMY    . HEMORROIDECTOMY    . SEPTOPLASTY      Family History  Problem Relation Age of Onset  . Breast cancer Cousin        mat cousin    Social History:  reports that she has never smoked. She has never used smokeless tobacco. She reports that she does not drink alcohol and does not use drugs. She denies any exposure to radiation or toxins. She denies alcohol and tobacco use. Her husband has schizophrenia and committed suicide. The patient is retired and has not worked since 63. 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  She then worked in the [employer] x 5 years until disability at age 62.  She lives in [place], [place]. She has a Neurosurgeon named Phoebe.  She is a Sales promotion account executive [employer], and will not accept blood products.  The patient is alone today.  Allergies:  Allergies  Allergen Reactions  . Levofloxacin     Critical   . Other Other (See Comments)    Antihistamines    Current Medications: Current Outpatient Medications  Medication Sig Dispense Refill  . acyclovir (ZOVIRAX) 400 MG tablet Take 1 tablet (400 mg total) by mouth 2 (two) times daily. 60 tablet 11  . albuterol (VENTOLIN HFA) 108 (90 Base) MCG/ACT inhaler 2 inhalations every 4 (four) hours as needed.    Marland Kitchen alendronate (FOSAMAX) 70 MG tablet     . aspirin 81 MG chewable tablet Chew by mouth daily.    Marland Kitchen atorvastatin (LIPITOR) 20 MG tablet Take by mouth.    . busPIRone (BUSPAR) 10 MG tablet Take 10 mg by mouth 2 (two) times daily.    . Calcium Carb-Cholecalciferol (CALCIUM 600 + D PO) Take 1,200 mg by mouth in the morning and at bedtime.     Marland Kitchen dexamethasone (DECADRON) 4 MG tablet 5 TAB BY MOUTH ONCE A WEEK. 5 TAB WEEKLY THE DAY AFTER DARATUMUMAB X 8 WEEKS. TAKE WITH BREAKFAST. 40 tablet 0  . docusate sodium (COLACE) 100 MG capsule Take 100 mg by  mouth daily.    . fluticasone-salmeterol (ADVAIR HFA) 230-21 MCG/ACT inhaler Inhale into the lungs.    Marland Kitchen lenalidomide (REVLIMID) 5 MG capsule Take 1 capsule (5 mg total) by mouth daily. Take 1 tab po for 21 days and 7 days off [employer] Auth # [id number] Date Obtained 02/23/2020 21 capsule 0  . montelukast (SINGULAIR) 10 MG tablet Take 10 mg by mouth at bedtime.    . naproxen (NAPROSYN) 500 MG tablet Take 500 mg by mouth 2 (two) times daily with a meal.    . nortriptyline (PAMELOR) 25 MG capsule Take by mouth.    Marland Kitchen omeprazole (PRILOSEC) 20 MG capsule Take 20 mg by mouth daily.    . sertraline (ZOLOFT) 100 MG tablet Take 200 mg by mouth daily.     . traZODone (DESYREL) 100 MG tablet Take 25 mg by mouth at bedtime.     . Vitamin D,  Ergocalciferol, (DRISDOL) 50000 UNITS CAPS capsule Take 50,000 Units by mouth every 7 (seven) days.    . ondansetron (ZOFRAN) 8 MG tablet Take 1 tablet (8 mg total) by mouth every 8 (eight) hours as needed (Nausea or vomiting). (Patient not taking: Reported on 02/28/2020) 30 tablet 1   No current facility-administered medications for this visit.    Review of Systems  Constitutional: Negative for chills, diaphoresis, fever, malaise/fatigue and weight loss (stable).       Feels "okay."  HENT: Negative for congestion, ear discharge, ear pain, hearing loss, nosebleeds, sinus pain, sore throat and tinnitus.   Eyes: Negative for blurred vision and double vision.  Respiratory: Negative for cough, hemoptysis, sputum production and shortness of breath.   Cardiovascular: Negative for chest pain, palpitations and leg swelling.  Gastrointestinal: Negative for abdominal pain, blood in stool, constipation (on stool softener), diarrhea, heartburn (on medication), melena, nausea (2 weeks ago) and vomiting.  Genitourinary: Negative for dysuria, frequency, hematuria and urgency.  Musculoskeletal: Negative for back pain, joint pain, myalgias and neck pain.  Skin: Negative for itching and rash.  Neurological: Negative for dizziness, tingling, sensory change, weakness (2 weeks ago) and headaches.       Occasional balance problems. Traumatic brain injury (fall) 3 years ago.   Endo/Heme/Allergies: Does not bruise/bleed easily.  Psychiatric/Behavioral: Negative for depression and memory loss. The patient is not nervous/anxious and does not have insomnia.   All other systems reviewed and are negative.  Performance status (ECOG): 1  Vitals Blood pressure 117/63, pulse 79, temperature (!) 97.2 F (36.2 C), temperature source Tympanic, resp. rate 18, height $RemoveBe'5\' 5"'hxyNjQnoz$  (1.651 m), weight 211 lb 10.3 oz (96 kg), SpO2 98 %.   Physical Exam Vitals and nursing note reviewed.  Constitutional:      General: She is not in  acute distress.    Appearance: Normal appearance. She is well-developed.     Interventions: Face mask in place.  HENT:     Head: Normocephalic and atraumatic.     Comments: Short graying hair.    Mouth/Throat:     Mouth: Mucous membranes are moist. No oral lesions.     Pharynx: Oropharynx is clear.  Eyes:     Extraocular Movements: Extraocular movements intact.     Conjunctiva/sclera: Conjunctivae normal.     Pupils: Pupils are equal, round, and reactive to light.     Comments: Glasses. Blue eyes.  Cardiovascular:     Rate and Rhythm: Normal rate and regular rhythm.     Heart sounds: Normal heart sounds. No murmur heard.  No friction  rub. No gallop.   Pulmonary:     Effort: Pulmonary effort is normal. No respiratory distress.     Breath sounds: Normal breath sounds. No wheezing or rales.  Chest:     Chest wall: No tenderness.  Abdominal:     General: Bowel sounds are normal. There is no distension.     Palpations: Abdomen is soft. There is no hepatomegaly, splenomegaly or mass.     Tenderness: There is no abdominal tenderness. There is no guarding or rebound.  Musculoskeletal:        General: No swelling or tenderness. Normal range of motion.     Cervical back: Normal range of motion and neck supple.  Lymphadenopathy:     Head:     Right side of head: No preauricular, posterior auricular or occipital adenopathy.     Left side of head: No preauricular, posterior auricular or occipital adenopathy.     Cervical: No cervical adenopathy.     Upper Body:     Right upper body: No supraclavicular or axillary adenopathy.     Left upper body: No supraclavicular or axillary adenopathy.     Lower Body: No right inguinal adenopathy. No left inguinal adenopathy.  Skin:    General: Skin is warm and dry.     Findings: No bruising, erythema, lesion or rash.  Neurological:     Mental Status: She is alert and oriented to person, place, and time.  Psychiatric:        Behavior: Behavior  normal.        Thought Content: Thought content normal.        Judgment: Judgment normal.    Appointment on 02/28/2020  Component Date Value Ref Range Status  . Magnesium 02/28/2020 1.9  1.7 - 2.4 mg/dL Final   Performed at [facility], [address] [place], [place] [address]  . Sodium 02/28/2020 139  135 - 145 mmol/L Final  . Potassium 02/28/2020 4.1  3.5 - 5.1 mmol/L Final  . Chloride 02/28/2020 103  98 - 111 mmol/L Final  . CO2 02/28/2020 27  22 - 32 mmol/L Final  . Glucose, Bld 02/28/2020 100* 70 - 99 mg/dL Final   Glucose reference range applies only to samples taken after fasting for at least 8 hours.  . BUN 02/28/2020 19  8 - 23 mg/dL Final  . Creatinine, Ser 02/28/2020 1.02* 0.44 - 1.00 mg/dL Final  . Calcium 02/28/2020 8.8* 8.9 - 10.3 mg/dL Final  . Total Protein 02/28/2020 7.6  6.5 - 8.1 g/dL Final  . Albumin 02/28/2020 3.7  3.5 - 5.0 g/dL Final  . AST 02/28/2020 21  15 - 41 U/L Final  . ALT 02/28/2020 25  0 - 44 U/L Final  . Alkaline Phosphatase 02/28/2020 100  38 - 126 U/L Final  . Total Bilirubin 02/28/2020 0.5  0.3 - 1.2 mg/dL Final  . GFR, Estimated 02/28/2020 58* >60 mL/min Final   Comment: (NOTE) Calculated using the CKD-EPI Creatinine Equation (2021)   . Anion gap 02/28/2020 9  5 - 15 Final   Performed at [facility], [address] [place], [place] [address]  . WBC 02/28/2020 3.8* 4.0 - 10.5 K/uL Final  . RBC 02/28/2020 4.06  3.87 - 5.11 MIL/uL Final  . Hemoglobin 02/28/2020 13.6  12.0 - 15.0 g/dL Final  . HCT 02/28/2020 41.0  36 - 46 % Final  . MCV 02/28/2020 101.0* 80.0 - 100.0 fL Final  . MCH 02/28/2020 33.5  26.0 - 34.0 pg Final  . MCHC 02/28/2020 33.2  30.0 - 36.0 g/dL Final  . RDW 02/28/2020 13.1  11.5 - 15.5 % Final  . Platelets 02/28/2020 352  150 - 400 K/uL Final  . nRBC 02/28/2020 0.0  0.0 - 0.2 % Final  . Neutrophils Relative % 02/28/2020 73  % Final  . Neutro Abs 02/28/2020 2.8  1.7 - 7.7 K/uL Final  .  Lymphocytes Relative 02/28/2020 13  % Final  . Lymphs Abs 02/28/2020 0.5* 0.7 - 4.0 K/uL Final  . Monocytes Relative 02/28/2020 6  % Final  . Monocytes Absolute 02/28/2020 0.2  0.1 - 1.0 K/uL Final  . Eosinophils Relative 02/28/2020 7  % Final  . Eosinophils Absolute 02/28/2020 0.3  0.0 - 0.5 K/uL Final  . Basophils Relative 02/28/2020 1  % Final  . Basophils Absolute 02/28/2020 0.0  0.0 - 0.1 K/uL Final  . Immature Granulocytes 02/28/2020 0  % Final  . Abs Immature Granulocytes 02/28/2020 0.01  0.00 - 0.07 K/uL Final   Performed at [facility], [address] [place], [place] [address]    Assessment:  Laurie [name] is a 74 y.o. female with stage III biclonal IgA multiple myeloma.  SPEP was 5.0 gm/dL on 10/03/2019. Random urine revealed 196.6 mg/dL total protein with 58.3% M-spike.   Bone marrow biopsy on 11/13/2019 revealed a hypercellular bone marrow with extensive involvement by plasma cell neoplasm. Atypical plasma cells represented 70% of all cells in the aspirate and was associated with prominent interstitial infiltrates and diffuse sheets in the clot.  Plasma cells were kappa light chain restricted.   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     Labs on 10/03/2019 revealed a hematocrit 27.9, hemoglobin 9.4, MCV 108, platelets 287,000, WBC 3,800 (ANC 2400).   Creatinine was 0.94, albumin 3.4, and protein 11.2.  Ferritin was 82 with an iron saturation of 30% and a TIBC of 206.  Hepatitis C antibody and HIV testing were negative.    Work-up on 10/26/2019 revealed a hematocrit was 27.4, hemoglobin 9.1, MCV 109.6, platelets 250,000, WBC 3,400 (ANC 2,100).  Retic was 2.2%.  Creatinine was 1.06 and calcium  8.6. Total protein was 11.4 and albumin 3.2.  M-spike was 5.7 gm/dL biclonal IgA protein with kappa specificity.  Kappa free light chain were 1,376.3, lambda free light chains <1.5, and ratio > 917.53 (0.26-1.65).  Beta 2 microglobulin was 10.3 (0.6 - 2.4). LDH was 102. TSH was 3.673.  Hepatitis serologies were negative on 11/28/2019.  24 hour UPEP on 10/30/2019 revealed 1020 mg/24 hours protein with 65.3% M-spike (665.9 mg/24 hours).  Urine free kappa light chain were 2,620.44, free lambda light chain 6.01 and ratio 436.01 (1.03-31.76).  Bone survey on 10/26/2019 revealed small lytic lesions in the skull. There was no acute fracture.  SPEP has been followed (gm/dL): 5.7 on 10/26/2019, 3.5 on 12/27/2019, 2.3 on 01/24/2020, and 1.0 on 02/28/2020. Kappa free light chains have been followed (mg/L): 1376.3 on 10/26/2019, 768 on 01/03/2020, 578.7 on 01/24/2020, and 440.6 (ratio 220.30).  She is day 1 of cycle # 4 daratumumab SQ and Decadron (11/29/2019 - 02/28/2020).  She began monthly Xgeva on 12/05/2019 (last 01/31/2020).  She has B12 deficiency. Vitamin B12 was 200 (  low) and folate >20.0 on 10/03/2019.  She was on weekly B12 injections x 3 and is now on monthly B12 injections (last 02/14/2020).  Antiparietal antibody and intrinsic factor antibodies were negative on 12/05/2019.  She is a Restaurant manager, fast food.  She receives Retacrit (last 12/13/2019) and IV iron support.  Ferritin was 66 on 02/28/2020.  She received Venofer on 12/05/2019, 12/13/2019, and 01/17/2020.  She received both doses of the COVID-19 vaccine in 08/2019.  Symptomatically, she has been "ok".  A couple of weeks ago, she felt weak and nauseous for about 4 days.  She denies shortness of breath.  Exam is stable.   Plan: 1.   Labs today: CBC with diff, CMP, Mg, ferritin, iron studies, SPEP, FLCA. 2.   Stage III multiple myeloma Patient was diagnosed with a 5.7 gm/dL biclonal IgA gammopathy with kappa specificity.                          M-spike was 3.5 on 12/27/2019. Kappa free light chains were 1,376.3 with a ratio >917.53 (0.26-1.65) on 10/26/2019.                          Kappa free light chains were 440.6 on 02/28/2020. Bone survey reveals lytic lesions in skull. She receives [address] (cycle length 28 days). Daratumumab SQ 1800 mg on days 1 and 15 for cycles #3 - #6 Revlimid 5 mg po day 1-21. Decadron 20 mg po days 1-2, and 15-16. Prophylaxis: Singulair 10 mg po the day before, the day of and 2 days after daratumumab. Acyclovir 400 mg po BID. Aspirin 81 mg a day. Supportive care: She receives Xgeva monthly (last 01/31/2020).   Xgeva today.             Labs reviewed.  Day 1 of cycle #4 daratumumab today.                         Revlimid to begin when medication received.    Delay in medication start does not extend Revlimid beyond day 21 of this cycle.             Discuss symptom management.  She has antiemetics at home to use on a prn bases.  Interventions are adequate.    .  3. Anemia Hematocrit 41.0. Hemoglobin 13.6. MCV 101.0. Etiology secondary to myeloma.  She is unable to receive PRBCs. Ferritin 66 with an iron saturation of 11% and a TIBC of 302 today.   She continues aggressive supportive care with Venofer and Retacrit as needed. Ferritin goal 100-400. Hemoglobin goal 11. Continue to monitor. 4. B12 deficiency She receives B12 monthly (last 02/14/2020).  Continue B12 monthly (next due 03/13/2020). 5.   Day 1 of cycle #4 daratumumab. 6.   Xgeva today. 7.   Assist patient with Revlimid Rx. 8.   RTC on 03/13/2020 for B12. 9.   RTC  in 2 weeks for MD assessment, labs (CBC with diff, CMP, Mg), and day 15 of cycle #4 daratumumab.  I discussed the assessment and treatment plan with the patient.  The patient was provided an opportunity to ask questions and all were answered.  The patient agreed with the plan and demonstrated an understanding of the instructions.  The patient was advised to call back if the symptoms worsen or if the condition fails to improve as anticipated.   Lequita Asal, MD, PhD  02/28/2020, 9:26 AM  I, Mirian Mo Tufford, am acting as Education administrator for [employer]. Mike Gip, MD, PhD.  I, Alixandra Alfieri C. Mike Gip, MD, have reviewed the above documentation for accuracy and completeness, and I agree with the above.

## 2020-02-28 ENCOUNTER — Inpatient Hospital Stay: Payer: Medicare HMO | Admitting: Hematology and Oncology

## 2020-02-28 ENCOUNTER — Other Ambulatory Visit: Payer: Self-pay

## 2020-02-28 ENCOUNTER — Encounter: Payer: Self-pay | Admitting: Hematology and Oncology

## 2020-02-28 ENCOUNTER — Inpatient Hospital Stay: Payer: Medicare HMO

## 2020-02-28 ENCOUNTER — Ambulatory Visit: Payer: Medicare HMO

## 2020-02-28 ENCOUNTER — Other Ambulatory Visit: Payer: Self-pay | Admitting: Hematology and Oncology

## 2020-02-28 VITALS — BP 122/73 | HR 78

## 2020-02-28 VITALS — BP 117/63 | HR 79 | Temp 97.2°F | Resp 18 | Ht 65.0 in | Wt 211.6 lb

## 2020-02-28 DIAGNOSIS — D649 Anemia, unspecified: Secondary | ICD-10-CM | POA: Diagnosis not present

## 2020-02-28 DIAGNOSIS — Z5112 Encounter for antineoplastic immunotherapy: Secondary | ICD-10-CM | POA: Diagnosis not present

## 2020-02-28 DIAGNOSIS — E538 Deficiency of other specified B group vitamins: Secondary | ICD-10-CM

## 2020-02-28 DIAGNOSIS — M899 Disorder of bone, unspecified: Secondary | ICD-10-CM

## 2020-02-28 DIAGNOSIS — C9000 Multiple myeloma not having achieved remission: Secondary | ICD-10-CM | POA: Diagnosis not present

## 2020-02-28 LAB — COMPREHENSIVE METABOLIC PANEL
ALT: 25 U/L (ref 0–44)
AST: 21 U/L (ref 15–41)
Albumin: 3.7 g/dL (ref 3.5–5.0)
Alkaline Phosphatase: 100 U/L (ref 38–126)
Anion gap: 9 (ref 5–15)
BUN: 19 mg/dL (ref 8–23)
CO2: 27 mmol/L (ref 22–32)
Calcium: 8.8 mg/dL — ABNORMAL LOW (ref 8.9–10.3)
Chloride: 103 mmol/L (ref 98–111)
Creatinine, Ser: 1.02 mg/dL — ABNORMAL HIGH (ref 0.44–1.00)
GFR, Estimated: 58 mL/min — ABNORMAL LOW (ref >60)
Glucose, Bld: 100 mg/dL — ABNORMAL HIGH (ref 70–99)
Potassium: 4.1 mmol/L (ref 3.5–5.1)
Sodium: 139 mmol/L (ref 135–145)
Total Bilirubin: 0.5 mg/dL (ref 0.3–1.2)
Total Protein: 7.6 g/dL (ref 6.5–8.1)

## 2020-02-28 LAB — CBC WITH DIFFERENTIAL/PLATELET
Abs Immature Granulocytes: 0.01 10*3/uL (ref 0.00–0.07)
Basophils Absolute: 0 10*3/uL (ref 0.0–0.1)
Basophils Relative: 1 %
Eosinophils Absolute: 0.3 10*3/uL (ref 0.0–0.5)
Eosinophils Relative: 7 %
HCT: 41 % (ref 36.0–46.0)
Hemoglobin: 13.6 g/dL (ref 12.0–15.0)
Immature Granulocytes: 0 %
Lymphocytes Relative: 13 %
Lymphs Abs: 0.5 10*3/uL — ABNORMAL LOW (ref 0.7–4.0)
MCH: 33.5 pg (ref 26.0–34.0)
MCHC: 33.2 g/dL (ref 30.0–36.0)
MCV: 101 fL — ABNORMAL HIGH (ref 80.0–100.0)
Monocytes Absolute: 0.2 10*3/uL (ref 0.1–1.0)
Monocytes Relative: 6 %
Neutro Abs: 2.8 10*3/uL (ref 1.7–7.7)
Neutrophils Relative %: 73 %
Platelets: 352 10*3/uL (ref 150–400)
RBC: 4.06 MIL/uL (ref 3.87–5.11)
RDW: 13.1 % (ref 11.5–15.5)
WBC: 3.8 10*3/uL — ABNORMAL LOW (ref 4.0–10.5)
nRBC: 0 % (ref 0.0–0.2)

## 2020-02-28 LAB — FERRITIN: Ferritin: 66 ng/mL (ref 11–307)

## 2020-02-28 LAB — IRON AND TIBC
Iron: 33 ug/dL (ref 28–170)
Saturation Ratios: 11 % (ref 10.4–31.8)
TIBC: 302 ug/dL (ref 250–450)
UIBC: 269 ug/dL

## 2020-02-28 LAB — MAGNESIUM: Magnesium: 1.9 mg/dL (ref 1.7–2.4)

## 2020-02-28 MED ORDER — DEXAMETHASONE 4 MG PO TABS
20.0000 mg | ORAL_TABLET | Freq: Once | ORAL | Status: AC
  Administered 2020-02-28: 20 mg via ORAL
  Filled 2020-02-28: qty 5

## 2020-02-28 MED ORDER — ACETAMINOPHEN 325 MG PO TABS
650.0000 mg | ORAL_TABLET | Freq: Once | ORAL | Status: AC
  Administered 2020-02-28: 650 mg via ORAL
  Filled 2020-02-28: qty 2

## 2020-02-28 MED ORDER — DIPHENHYDRAMINE HCL 25 MG PO CAPS
50.0000 mg | ORAL_CAPSULE | Freq: Once | ORAL | Status: AC
  Administered 2020-02-28: 50 mg via ORAL
  Filled 2020-02-28: qty 2

## 2020-02-28 MED ORDER — ONDANSETRON HCL 4 MG PO TABS
8.0000 mg | ORAL_TABLET | Freq: Once | ORAL | Status: AC
  Administered 2020-02-28: 8 mg via ORAL
  Filled 2020-02-28: qty 2

## 2020-02-28 MED ORDER — DARATUMUMAB-HYALURONIDASE-FIHJ 1800-30000 MG-UT/15ML ~~LOC~~ SOLN
1800.0000 mg | Freq: Once | SUBCUTANEOUS | Status: AC
  Administered 2020-02-28: 1800 mg via SUBCUTANEOUS
  Filled 2020-02-28: qty 15

## 2020-02-28 MED ORDER — DENOSUMAB 120 MG/1.7ML ~~LOC~~ SOLN
120.0000 mg | Freq: Once | SUBCUTANEOUS | Status: AC
  Administered 2020-02-28: 120 mg via SUBCUTANEOUS
  Filled 2020-02-28: qty 1.7

## 2020-02-28 NOTE — Progress Notes (Signed)
The patient reports not feeling well x 4 days, Friday night the patient states she was very weak, but better today

## 2020-02-29 ENCOUNTER — Other Ambulatory Visit: Payer: Self-pay | Admitting: *Deleted

## 2020-02-29 DIAGNOSIS — C9000 Multiple myeloma not having achieved remission: Secondary | ICD-10-CM

## 2020-02-29 LAB — KAPPA/LAMBDA LIGHT CHAINS
Kappa free light chain: 440.6 mg/L — ABNORMAL HIGH (ref 3.3–19.4)
Kappa, lambda light chain ratio: 220.3 — ABNORMAL HIGH (ref 0.26–1.65)
Lambda free light chains: 2 mg/L — ABNORMAL LOW (ref 5.7–26.3)

## 2020-02-29 MED ORDER — LENALIDOMIDE 5 MG PO CAPS: 5.0000 mg | ORAL_CAPSULE | Freq: Every day | ORAL | 0 refills | Status: DC

## 2020-02-29 MED ORDER — LENALIDOMIDE 5 MG PO CAPS: ORAL_CAPSULE | ORAL | 0 refills | Status: DC

## 2020-03-01 LAB — PROTEIN ELECTROPHORESIS, SERUM
A/G Ratio: 1 (ref 0.7–1.7)
Albumin ELP: 3.4 g/dL (ref 2.9–4.4)
Alpha-1-Globulin: 0.2 g/dL (ref 0.0–0.4)
Alpha-2-Globulin: 0.7 g/dL (ref 0.4–1.0)
Beta Globulin: 0.9 g/dL (ref 0.7–1.3)
Gamma Globulin: 1.7 g/dL (ref 0.4–1.8)
Globulin, Total: 3.4 g/dL (ref 2.2–3.9)
M-Spike, %: 1 g/dL — ABNORMAL HIGH
Total Protein ELP: 6.8 g/dL (ref 6.0–8.5)

## 2020-03-04 ENCOUNTER — Telehealth: Payer: Self-pay | Admitting: *Deleted

## 2020-03-04 NOTE — Telephone Encounter (Signed)
Dr. Posey Pronto office called to ask if pt should get booster vaccine for pt and if there would be changes to the chemo based on the vaccine. Dr. Mike Gip said it is ok to give booster shot if she qualifies to get it. Called that info and Dr. Posey Pronto called back and spoke to me. She said that the pt. Is immuno comprised so she qualfies for it and also because she is going through treatment for Multiple myeloma she gets full dose of vaccine. Dr. Mike Gip said no change in her treatment schedule needed. Dr. Posey Pronto ok with above info.

## 2020-03-11 NOTE — Progress Notes (Signed)
[facility]  [address] [place], [place] [address] Phone: [phone]  Fax: [phone]   Clinic Day:  03/13/2020  Referring physician: Denton Lank, MD  Chief Complaint: Laurie [name] is a 74 y.o. female with multiple myeloma who is seen for assessment prior to day 15 of cycle #4 daratumumab, Revlimid and Decadron.   HPI: The patient was last seen in the hematology clinic on 02/28/2020. At that time, she felt "ok".  A couple of weeks prior, she felt weak and nauseous for about 4 days.  She denied shortness of breath.  Exam was stable. Hematocrit was 41.0, hemoglobin 13.6, platelets 352,000, WBC 3,800 (ANC 2,800). Creatinine was 1.02 (CrCl 58 ml/min).  Ferritin was 66 with an iron saturation of 11% and a TIBC of 302. M spike was 1.0 g/dL. Kappa free light chains were 440.6, lambda free light chains 2.0, and ratio 220.30.  She received day 1 of cycle #4 daratumumab, Revlimid and Decadron. She received Xgeva.  During the interim, she has been "good." She took three nausea pills over two days last week. She denies any nausea at this time. She denies fever, bone pain, weakness, and worsening shortness of breath.  She has been taking Calcium 1200 mg daily. Calcium is low today.  She is going to increase to 1800 mg daily. She takes Singulair, acyclovir, Decadron, and aspirin as prescribed.  She started Revlimid on 03/04/2020 (last day for this cycle is 03/19/2020 to allow for 1 week off therapy prior to next cycle).  The patient thinks she may have hemorrhoids. She took an at-home fecal immunochemical test and it came back positive for blood in the stool.   Past Medical History:  Diagnosis Date  . Anxiety   . Asthma   . Depression   . Head injury   . Osteoporosis     Past Surgical History:  Procedure Laterality Date  . ABDOMINAL HYSTERECTOMY    . HEMORROIDECTOMY    . SEPTOPLASTY      Family History  Problem Relation Age of Onset  . Breast  cancer Cousin        mat cousin    Social History:  reports that she has never smoked. She has never used smokeless tobacco. She reports that she does not drink alcohol and does not use drugs. She denies any exposure to radiation or toxins. She denies alcohol and tobacco use. Her husband has schizophrenia and committed suicide. The patient is retired and has not worked since 68. 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  She then worked in the [employer] x 5 years until disability at age 11.  She lives in [place], [place]. She has a Neurosurgeon named Phoebe.  She is a Sales promotion account executive [employer], and will not accept blood products.  The patient is alone today.  Allergies:  Allergies  Allergen Reactions  . Levofloxacin     Critical   . Other Other (See Comments)    Antihistamines    Current Medications: Current Outpatient Medications  Medication Sig Dispense Refill  . acyclovir (ZOVIRAX) 400 MG tablet Take 1 tablet (400 mg total) by mouth 2 (two) times daily. 60 tablet 11  . alendronate (FOSAMAX) 70 MG tablet     . aspirin 81 MG chewable tablet Chew by mouth daily.    Marland Kitchen atorvastatin (LIPITOR) 20 MG tablet Take by mouth.    . busPIRone (BUSPAR) 10 MG tablet Take  10 mg by mouth 2 (two) times daily.    . Calcium Carb-Cholecalciferol (CALCIUM 600 + D PO) Take 1,200 mg by mouth in the morning and at bedtime.     Marland Kitchen dexamethasone (DECADRON) 4 MG tablet 5 TAB BY MOUTH ONCE A WEEK. 5 TAB WEEKLY THE DAY AFTER DARATUMUMAB X 8 WEEKS. TAKE WITH BREAKFAST. 40 tablet 0  . docusate sodium (COLACE) 100 MG capsule Take 100 mg by mouth daily.    . fluticasone-salmeterol (ADVAIR HFA) 230-21 MCG/ACT inhaler Inhale into the lungs.    Marland Kitchen lenalidomide (REVLIMID) 5 MG capsule Take 1 tab po for 21 days and 7 days off [employer] Auth # [id number] Date Obtained 02/23/2020 21 capsule 0  . montelukast (SINGULAIR) 10 MG tablet Take 10 mg by mouth at bedtime.    . naproxen (NAPROSYN) 500  MG tablet Take 500 mg by mouth 2 (two) times daily with a meal.    . nortriptyline (PAMELOR) 25 MG capsule Take by mouth.    Marland Kitchen omeprazole (PRILOSEC) 20 MG capsule Take 20 mg by mouth daily.    . ondansetron (ZOFRAN) 8 MG tablet Take 1 tablet (8 mg total) by mouth every 8 (eight) hours as needed (Nausea or vomiting). 30 tablet 1  . sertraline (ZOLOFT) 100 MG tablet Take 200 mg by mouth daily.     . traZODone (DESYREL) 100 MG tablet Take 25 mg by mouth at bedtime.     . Vitamin D, Ergocalciferol, (DRISDOL) 50000 UNITS CAPS capsule Take 50,000 Units by mouth every 7 (seven) days.    Marland Kitchen albuterol (VENTOLIN HFA) 108 (90 Base) MCG/ACT inhaler 2 inhalations every 4 (four) hours as needed. (Patient not taking: Reported on 03/13/2020)     No current facility-administered medications for this visit.   Facility-Administered Medications Ordered in Other Visits  Medication Dose Route Frequency Provider Last Rate Last Admin  . cyanocobalamin ((VITAMIN B-12)) injection 1,000 mcg  1,000 mcg Intramuscular Once Lequita Asal, MD        Review of Systems  Constitutional: Positive for weight loss (2 lbs). Negative for chills, diaphoresis, fever and malaise/fatigue.       Feels "good."  HENT: Negative for congestion, ear discharge, ear pain, hearing loss, nosebleeds, sinus pain, sore throat and tinnitus.   Eyes: Negative for blurred vision and double vision.  Respiratory: Negative for cough, hemoptysis, sputum production and shortness of breath.   Cardiovascular: Negative for chest pain, palpitations and leg swelling.  Gastrointestinal: Positive for blood in stool. Negative for abdominal pain, constipation (on stool softener), diarrhea, heartburn (on medication), melena, nausea (last week) and vomiting.       Hemorrhoids.  Genitourinary: Negative for dysuria, frequency, hematuria and urgency.  Musculoskeletal: Negative for back pain, joint pain, myalgias and neck pain.  Skin: Negative for itching and  rash.  Neurological: Negative for dizziness, tingling, sensory change, weakness and headaches.       Occasional balance problems. Traumatic brain injury (fall) 3 years ago.   Endo/Heme/Allergies: Does not bruise/bleed easily.  Psychiatric/Behavioral: Negative for depression and memory loss. The patient is not nervous/anxious and does not have insomnia.   All other systems reviewed and are negative.  Performance status (ECOG): 1  Vitals Blood pressure 135/81, pulse 99, temperature 97.7 F (36.5 C), temperature source Tympanic, resp. rate 18, weight 209 lb 7 oz (95 kg), SpO2 99 %.   Physical Exam Vitals and nursing note reviewed.  Constitutional:      General: She is not in acute distress.  Appearance: Normal appearance. She is well-developed.     Interventions: Face mask in place.  HENT:     Head: Normocephalic and atraumatic.     Comments: Short styled graying hair.    Mouth/Throat:     Mouth: Mucous membranes are moist. No oral lesions.     Pharynx: Oropharynx is clear.  Eyes:     Extraocular Movements: Extraocular movements intact.     Conjunctiva/sclera: Conjunctivae normal.     Pupils: Pupils are equal, round, and reactive to light.     Comments: Glasses. Blue eyes.  Cardiovascular:     Rate and Rhythm: Normal rate and regular rhythm.     Heart sounds: Normal heart sounds. No murmur heard.  No friction rub. No gallop.   Pulmonary:     Effort: Pulmonary effort is normal. No respiratory distress.     Breath sounds: Normal breath sounds. No wheezing or rales.  Chest:     Chest wall: No tenderness.  Abdominal:     General: Bowel sounds are normal. There is no distension.     Palpations: Abdomen is soft. There is no hepatomegaly, splenomegaly or mass.     Tenderness: There is no abdominal tenderness. There is no guarding or rebound.  Musculoskeletal:        General: No swelling or tenderness. Normal range of motion.     Cervical back: Normal range of motion and neck  supple.  Lymphadenopathy:     Head:     Right side of head: No preauricular, posterior auricular or occipital adenopathy.     Left side of head: No preauricular, posterior auricular or occipital adenopathy.     Cervical: No cervical adenopathy.     Upper Body:     Right upper body: No supraclavicular or axillary adenopathy.     Left upper body: No supraclavicular or axillary adenopathy.     Lower Body: No right inguinal adenopathy. No left inguinal adenopathy.  Skin:    General: Skin is warm and dry.     Findings: No bruising, erythema, lesion or rash.  Neurological:     Mental Status: She is alert and oriented to person, place, and time.  Psychiatric:        Behavior: Behavior normal.        Thought Content: Thought content normal.        Judgment: Judgment normal.    Appointment on 03/13/2020  Component Date Value Ref Range Status  . Magnesium 03/13/2020 2.2  1.7 - 2.4 mg/dL Final   Performed at [facility], [address] [place], [place] [address]  . Sodium 03/13/2020 137  135 - 145 mmol/L Final  . Potassium 03/13/2020 4.1  3.5 - 5.1 mmol/L Final  . Chloride 03/13/2020 99  98 - 111 mmol/L Final  . CO2 03/13/2020 26  22 - 32 mmol/L Final  . Glucose, Bld 03/13/2020 97  70 - 99 mg/dL Final   Glucose reference range applies only to samples taken after fasting for at least 8 hours.  . BUN 03/13/2020 19  8 - 23 mg/dL Final  . Creatinine, Ser 03/13/2020 0.96  0.44 - 1.00 mg/dL Final  . Calcium 50/50/9185 8.4* 8.9 - 10.3 mg/dL Final  . Total Protein 03/13/2020 7.3  6.5 - 8.1 g/dL Final  . Albumin 99/56/6717 3.7  3.5 - 5.0 g/dL Final  . AST 79/56/4629 26  15 - 41 U/L Final  . ALT 03/13/2020 31  0 - 44 U/L Final  .  Alkaline Phosphatase 03/13/2020 122  38 - 126 U/L Final  . Total Bilirubin 03/13/2020 0.5  0.3 - 1.2 mg/dL Final  . GFR, Estimated 03/13/2020 >60  >60 mL/min Final   Comment: (NOTE) Calculated using the CKD-EPI Creatinine Equation (2021)   . Anion gap  03/13/2020 12  5 - 15 Final   Performed at [facility], [address] [place], [place] [address]  . WBC 03/13/2020 6.3  4.0 - 10.5 K/uL Final  . RBC 03/13/2020 4.12  3.87 - 5.11 MIL/uL Final  . Hemoglobin 03/13/2020 13.4  12.0 - 15.0 g/dL Final  . HCT 03/13/2020 40.7  36 - 46 % Final  . MCV 03/13/2020 98.8  80.0 - 100.0 fL Final  . MCH 03/13/2020 32.5  26.0 - 34.0 pg Final  . MCHC 03/13/2020 32.9  30.0 - 36.0 g/dL Final  . RDW 03/13/2020 13.0  11.5 - 15.5 % Final  . Platelets 03/13/2020 323  150 - 400 K/uL Final  . nRBC 03/13/2020 0.0  0.0 - 0.2 % Final  . Neutrophils Relative % 03/13/2020 66  % Final  . Neutro Abs 03/13/2020 4.1  1.7 - 7.7 K/uL Final  . Lymphocytes Relative 03/13/2020 14  % Final  . Lymphs Abs 03/13/2020 0.9  0.7 - 4.0 K/uL Final  . Monocytes Relative 03/13/2020 6  % Final  . Monocytes Absolute 03/13/2020 0.4  0.1 - 1.0 K/uL Final  . Eosinophils Relative 03/13/2020 13  % Final  . Eosinophils Absolute 03/13/2020 0.8* 0.0 - 0.5 K/uL Final  . Basophils Relative 03/13/2020 1  % Final  . Basophils Absolute 03/13/2020 0.1  0.0 - 0.1 K/uL Final  . Immature Granulocytes 03/13/2020 0  % Final  . Abs Immature Granulocytes 03/13/2020 0.01  0.00 - 0.07 K/uL Final   Performed at [facility], [address], [place],  [address]    Assessment:  DELAYNEE ALRED is a 74 y.o. female with stage III biclonal IgA multiple myeloma.  SPEP was 5.0 gm/dL on 10/03/2019. Random urine revealed 196.6 mg/dL total protein with 58.3% M-spike.   Bone marrow biopsy on 11/13/2019 revealed a hypercellular bone marrow with extensive involvement by plasma cell neoplasm. Atypical plasma cells represented 70% of all cells in the aspirate and was associated with prominent interstitial infiltrates and diffuse sheets in the clot.  Plasma cells were kappa light chain restricted.   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     Labs on 10/03/2019 revealed a hematocrit 27.9, hemoglobin 9.4, MCV 108, platelets 287,000, WBC 3,800 (ANC 2400).   Creatinine was 0.94, albumin 3.4, and protein 11.2.  Ferritin was 82 with an iron saturation of 30% and a TIBC of 206.  Hepatitis C antibody and HIV testing were negative.    Work-up on 10/26/2019 revealed a hematocrit was 27.4, hemoglobin 9.1, MCV 109.6, platelets 250,000, WBC 3,400 (ANC 2,100).  Retic was 2.2%.  Creatinine was 1.06 and calcium 8.6. Total protein was 11.4 and albumin 3.2.  M-spike was 5.7 gm/dL biclonal IgA protein with kappa specificity.  Kappa free light chain were 1,376.3, lambda free light chains <1.5, and ratio > 917.53 (0.26-1.65).  Beta 2 microglobulin was 10.3 (0.6 - 2.4). LDH was 102. TSH was 3.673.  Hepatitis serologies were negative on 11/28/2019.  24 hour UPEP on 10/30/2019 revealed 1020 mg/24 hours protein with 65.3% M-spike (665.9 mg/24 hours).  Urine free kappa light chain were 2,620.44, free lambda light chain 6.01 and ratio 436.01 (1.03-31.76).  Bone survey on 10/26/2019 revealed small lytic lesions in the skull. There was no acute fracture.  SPEP has been followed (gm/dL): 5.7 on 10/26/2019, 3.5 on 12/27/2019, 2.3 on 01/24/2020, and 1.0 on 02/28/2020. Kappa free light chains have been followed (mg/L): 1376.3 on 10/26/2019, 768 on 01/03/2020, 578.7 on 01/24/2020, and 440.6 (ratio 220.30).  She is day 15 of cycle # 4 daratumumab SQ and Decadron (11/29/2019 - 02/28/2020).  She began monthly Xgeva on 12/05/2019 (last 02/28/2020).  She has B12 deficiency. Vitamin B12 was 200 (low) and folate >20.0 on 10/03/2019.  She was on weekly B12 injections x 3 and is now on monthly B12 injections (last 02/14/2020).  Antiparietal antibody  and intrinsic factor antibodies were negative on 12/05/2019.  She is a Restaurant manager, fast food.  She receives Retacrit (last 12/13/2019) and IV iron support.  Ferritin was 66 on 02/28/2020.  She received Venofer on 12/05/2019, 12/13/2019, and 01/17/2020.  She received both doses of the COVID-19 vaccine in 08/2019.  Symptomatically, she feels "good".  She denies any bone pain or interval infections.  Exam is stable.  Calcium is 8.4 (corrected 8.64).  Plan: 1.   Labs today: CBC with diff, CMP, Mg. 2.   Stage III multiple myeloma Patient was diagnosed with a 5.7 gm/dL biclonal IgA gammopathy with kappa specificity.                         M-spike was 1.0 on 02/28/2020. Kappa free light chains were 1,376.3 with a ratio >917.53 (0.26-1.65) on 10/26/2019.                          Kappa free light chains were 440.6 on 02/28/2020. Bone survey reveals lytic lesions in skull. She receives [address] (cycle length 28 days). Daratumumab SQ 1800 mg on days 1 and 15 for cycles #3 - #6 Revlimid 5 mg po day 1-21. Decadron 20 mg po days 1-2, and 15-16. Prophylaxis: Singulair 10 mg po the day before, the day of and 2 days after daratumumab. Acyclovir 400 mg po BID. Aspirin 81 mg a day. Supportive care: She receives Xgeva monthly (last 02/28/2020).   Xgeva today.             Symptomatically, she is doing well.  She denies any concerns.  Labs reviewed.  Day 15 of cycle #4 daratumumab today.                          Revlimid started on 03/04/2020 (late).     Revlimid cycle to complete on 03/19/2020 to allow full week off therapy.    Delay in medication start does not extend Revlimid beyond day 21 of this cycle.             Discuss symptom management.  She has antiemetics  at home to use on a prn bases.  Interventions are adequate.      Refill ondansetron.  3. Anemia, resolved Hematocrit 40.7. Hemoglobin 13.4. MCV 98.8. Etiology was secondary to myeloma.  She is unable to receive PRBCs. Ferritin 66 with an iron saturation of 11% and a TIBC of 302 today.   She receives aggressive supportive care with  Venofer and Retacrit as needed. Ferritin goal 100-400. Hemoglobin goal 11. Continue to monitor. 4. B12 deficiency She receives B12 monthly (last 02/14/2020).  B12 today and monthly x 6. 5.   Hypocalcemia  Calcium 8.4 (corrected 8.64).  Increase calcium from 1200 mg a day to 1800 mg a day. 6.   Day 15 of cycle #4 daratumumab today. 7.   B12 today and monthly x 6. 8.   RTC in 2 weeks for MD assessment, labs (CBC with diff, CMP, Mg, SPEP, FLCA), Xgeva, and day 1 of cycle #5 daratumumab.  I discussed the assessment and treatment plan with the patient.  The patient was provided an opportunity to ask questions and all were answered.  The patient agreed with the plan and demonstrated an understanding of the instructions.  The patient was advised to call back if the symptoms worsen or if the condition fails to improve as anticipated.   Lequita Asal, MD, PhD    03/13/2020, 9:53 AM  I, Mirian Mo Tufford, am acting as Education administrator for [employer]. Mike Gip, MD, PhD.  I, Damione Robideau C. Mike Gip, MD, have reviewed the above documentation for accuracy and completeness, and I agree with the above.

## 2020-03-13 ENCOUNTER — Inpatient Hospital Stay: Payer: Medicare HMO

## 2020-03-13 ENCOUNTER — Inpatient Hospital Stay: Payer: Medicare HMO | Admitting: Hematology and Oncology

## 2020-03-13 ENCOUNTER — Inpatient Hospital Stay: Payer: Medicare HMO | Attending: Hematology and Oncology

## 2020-03-13 ENCOUNTER — Encounter: Payer: Self-pay | Admitting: Hematology and Oncology

## 2020-03-13 ENCOUNTER — Telehealth: Payer: Self-pay

## 2020-03-13 ENCOUNTER — Other Ambulatory Visit: Payer: Self-pay

## 2020-03-13 VITALS — BP 135/81 | HR 99 | Temp 97.7°F | Resp 18 | Wt 209.4 lb

## 2020-03-13 VITALS — BP 114/70 | HR 89

## 2020-03-13 DIAGNOSIS — D649 Anemia, unspecified: Secondary | ICD-10-CM | POA: Diagnosis not present

## 2020-03-13 DIAGNOSIS — Z79899 Other long term (current) drug therapy: Secondary | ICD-10-CM | POA: Insufficient documentation

## 2020-03-13 DIAGNOSIS — Z7982 Long term (current) use of aspirin: Secondary | ICD-10-CM | POA: Insufficient documentation

## 2020-03-13 DIAGNOSIS — F418 Other specified anxiety disorders: Secondary | ICD-10-CM | POA: Diagnosis not present

## 2020-03-13 DIAGNOSIS — Z803 Family history of malignant neoplasm of breast: Secondary | ICD-10-CM | POA: Insufficient documentation

## 2020-03-13 DIAGNOSIS — M810 Age-related osteoporosis without current pathological fracture: Secondary | ICD-10-CM | POA: Diagnosis not present

## 2020-03-13 DIAGNOSIS — C9000 Multiple myeloma not having achieved remission: Secondary | ICD-10-CM

## 2020-03-13 DIAGNOSIS — E538 Deficiency of other specified B group vitamins: Secondary | ICD-10-CM | POA: Insufficient documentation

## 2020-03-13 DIAGNOSIS — J45909 Unspecified asthma, uncomplicated: Secondary | ICD-10-CM | POA: Insufficient documentation

## 2020-03-13 DIAGNOSIS — Z90710 Acquired absence of both cervix and uterus: Secondary | ICD-10-CM | POA: Diagnosis not present

## 2020-03-13 DIAGNOSIS — Z5112 Encounter for antineoplastic immunotherapy: Secondary | ICD-10-CM

## 2020-03-13 LAB — CBC WITH DIFFERENTIAL/PLATELET
Abs Immature Granulocytes: 0.01 10*3/uL (ref 0.00–0.07)
Basophils Absolute: 0.1 10*3/uL (ref 0.0–0.1)
Basophils Relative: 1 %
Eosinophils Absolute: 0.8 10*3/uL — ABNORMAL HIGH (ref 0.0–0.5)
Eosinophils Relative: 13 %
HCT: 40.7 % (ref 36.0–46.0)
Hemoglobin: 13.4 g/dL (ref 12.0–15.0)
Immature Granulocytes: 0 %
Lymphocytes Relative: 14 %
Lymphs Abs: 0.9 10*3/uL (ref 0.7–4.0)
MCH: 32.5 pg (ref 26.0–34.0)
MCHC: 32.9 g/dL (ref 30.0–36.0)
MCV: 98.8 fL (ref 80.0–100.0)
Monocytes Absolute: 0.4 10*3/uL (ref 0.1–1.0)
Monocytes Relative: 6 %
Neutro Abs: 4.1 10*3/uL (ref 1.7–7.7)
Neutrophils Relative %: 66 %
Platelets: 323 10*3/uL (ref 150–400)
RBC: 4.12 MIL/uL (ref 3.87–5.11)
RDW: 13 % (ref 11.5–15.5)
WBC: 6.3 10*3/uL (ref 4.0–10.5)
nRBC: 0 % (ref 0.0–0.2)

## 2020-03-13 LAB — MAGNESIUM: Magnesium: 2.2 mg/dL (ref 1.7–2.4)

## 2020-03-13 LAB — COMPREHENSIVE METABOLIC PANEL
ALT: 31 U/L (ref 0–44)
AST: 26 U/L (ref 15–41)
Albumin: 3.7 g/dL (ref 3.5–5.0)
Alkaline Phosphatase: 122 U/L (ref 38–126)
Anion gap: 12 (ref 5–15)
BUN: 19 mg/dL (ref 8–23)
CO2: 26 mmol/L (ref 22–32)
Calcium: 8.4 mg/dL — ABNORMAL LOW (ref 8.9–10.3)
Chloride: 99 mmol/L (ref 98–111)
Creatinine, Ser: 0.96 mg/dL (ref 0.44–1.00)
GFR, Estimated: >60 mL/min (ref >60)
Glucose, Bld: 97 mg/dL (ref 70–99)
Potassium: 4.1 mmol/L (ref 3.5–5.1)
Sodium: 137 mmol/L (ref 135–145)
Total Bilirubin: 0.5 mg/dL (ref 0.3–1.2)
Total Protein: 7.3 g/dL (ref 6.5–8.1)

## 2020-03-13 MED ORDER — DARATUMUMAB-HYALURONIDASE-FIHJ 1800-30000 MG-UT/15ML ~~LOC~~ SOLN
1800.0000 mg | Freq: Once | SUBCUTANEOUS | Status: AC
  Administered 2020-03-13: 1800 mg via SUBCUTANEOUS
  Filled 2020-03-13: qty 15

## 2020-03-13 MED ORDER — ONDANSETRON HCL 8 MG PO TABS: 8.0000 mg | ORAL_TABLET | Freq: Three times a day (TID) | ORAL | 1 refills | Status: DC | PRN

## 2020-03-13 MED ORDER — CYANOCOBALAMIN 1000 MCG/ML IJ SOLN
1000.0000 ug | Freq: Once | INTRAMUSCULAR | Status: AC
  Administered 2020-03-13: 1000 ug via INTRAMUSCULAR
  Filled 2020-03-13: qty 1

## 2020-03-13 MED ORDER — DEXAMETHASONE 4 MG PO TABS
20.0000 mg | ORAL_TABLET | Freq: Once | ORAL | Status: AC
  Administered 2020-03-13: 20 mg via ORAL
  Filled 2020-03-13: qty 5

## 2020-03-13 MED ORDER — ONDANSETRON HCL 4 MG PO TABS
8.0000 mg | ORAL_TABLET | Freq: Once | ORAL | Status: AC
  Administered 2020-03-13: 8 mg via ORAL
  Filled 2020-03-13: qty 2

## 2020-03-13 MED ORDER — ACETAMINOPHEN 325 MG PO TABS
650.0000 mg | ORAL_TABLET | Freq: Once | ORAL | Status: AC
  Administered 2020-03-13: 650 mg via ORAL
  Filled 2020-03-13: qty 2

## 2020-03-13 MED ORDER — DIPHENHYDRAMINE HCL 25 MG PO CAPS
50.0000 mg | ORAL_CAPSULE | Freq: Once | ORAL | Status: AC
  Administered 2020-03-13: 50 mg via ORAL
  Filled 2020-03-13: qty 2

## 2020-03-13 NOTE — Telephone Encounter (Signed)
Patient aware of Dr Drenda Freeze directions: Revlimid started on 03/04/2020 (late).   Revlimid cycle to complete on 03/19/2020 to allow full week (7 days) off therapy.  Delay in medication start does not extend Revlimid beyond day 21 of this cycle.

## 2020-03-13 NOTE — Patient Instructions (Addendum)
  Increase calcium to 600 mg by mouth 3 times a day.  Last day Revlimid is 03/19/2020.

## 2020-03-13 NOTE — Telephone Encounter (Signed)
Called patient to discuss revlimid cycle dates per Dr Mike Gip;  # ending in [phone]- called twice, no answer, mailbox not set up # ending [phone]- left message to return call # ending [phone]- disconnected  Will try again later.

## 2020-03-13 NOTE — Progress Notes (Signed)
Patient want to get B12 shot today. Patient did a home colon cancer screening that showed blood in her stool and was told to follow up with her doctor about it. She also reports hemorrhoids and some hard/painful stools.

## 2020-03-20 ENCOUNTER — Ambulatory Visit: Payer: Medicare HMO

## 2020-03-25 ENCOUNTER — Other Ambulatory Visit: Payer: Self-pay | Admitting: *Deleted

## 2020-03-25 DIAGNOSIS — C9000 Multiple myeloma not having achieved remission: Secondary | ICD-10-CM

## 2020-03-26 NOTE — Progress Notes (Signed)
[facility]  [address] [place], [place] [address] Phone: [phone]  Fax: [phone]   Clinic Day:  03/27/2020  Referring physician: Denton Lank, MD  Chief Complaint: Laurie [name] is a 74 y.o. female with multiple myeloma who is seen for assessment prior to day 1 of cycle #5 daratumumab, Revlimid and Decadron.   HPI: The patient was last seen in the hematology clinic on 03/13/2020. At that time, she felt "good".  She denied any bone pain or interval infections. Exam was stable. Hematocrit was 40.7, hemoglobin 13.4, platelets 323,000, WBC 6,300. Calcium was 8.4 (corrected to 8.64).  Magnesium was 2.2. She received day 15 of cycle #4 daratumumab, Revlimid and Decadron. She received a vitamin B12 injection. She was to increase calcium from 1200 mg a day to 1800 mg a day.  During the interim, she has been "good." She had nausea for a few days and lost her appetite. Yesterday and today are good days. She has had a little bit of blood in her stool due to constipation and straining. She is taking stool softeners. She increased her calcium to 1800 mg daily.   She has been coughing a little bit at morning and night but states that it may be just due to her asthma.   Past Medical History:  Diagnosis Date  . Anxiety   . Asthma   . Depression   . Head injury   . Osteoporosis     Past Surgical History:  Procedure Laterality Date  . ABDOMINAL HYSTERECTOMY    . HEMORROIDECTOMY    . SEPTOPLASTY      Family History  Problem Relation Age of Onset  . Breast cancer Cousin        mat cousin    Social History:  reports that she has never smoked. She has never used smokeless tobacco. She reports that she does not drink alcohol and does not use drugs. She denies any exposure to radiation or toxins. She denies alcohol and tobacco use. Her husband has schizophrenia and committed suicide. The patient is retired and has not worked since 12. 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  She then worked in the [employer] x 5 years until disability at age 28.  She lives in [place], [place]. She has a Neurosurgeon named Phoebe.  She is a Sales promotion account executive [employer], and will not accept blood products.  The patient is alone today.  Allergies:  Allergies  Allergen Reactions  . Levofloxacin     Critical   . Other Other (See Comments)    Antihistamines    Current Medications: Current Outpatient Medications  Medication Sig Dispense Refill  . acyclovir (ZOVIRAX) 400 MG tablet Take 1 tablet (400 mg total) by mouth 2 (two) times daily. 60 tablet 11  . alendronate (FOSAMAX) 70 MG tablet     . aspirin 81 MG chewable tablet Chew by mouth daily.    Marland Kitchen atorvastatin (LIPITOR) 20 MG tablet Take by mouth.    . busPIRone (BUSPAR) 10 MG tablet Take 10 mg by mouth 2 (two) times daily.    . Calcium Carb-Cholecalciferol (CALCIUM 600 + D PO) Take 1,200 mg by mouth in the morning and at bedtime.     Marland Kitchen dexamethasone (DECADRON) 4 MG tablet 5 TAB BY MOUTH ONCE A WEEK. 5 TAB WEEKLY THE DAY AFTER DARATUMUMAB X 8 WEEKS. TAKE WITH BREAKFAST. 40 tablet 0  . docusate sodium (  COLACE) 100 MG capsule Take 100 mg by mouth daily.    . montelukast (SINGULAIR) 10 MG tablet Take 10 mg by mouth at bedtime.    . naproxen (NAPROSYN) 500 MG tablet Take 500 mg by mouth 2 (two) times daily with a meal.    . nortriptyline (PAMELOR) 25 MG capsule Take by mouth.    Marland Kitchen omeprazole (PRILOSEC) 20 MG capsule Take 20 mg by mouth daily.    . ondansetron (ZOFRAN) 8 MG tablet Take 1 tablet (8 mg total) by mouth every 8 (eight) hours as needed (Nausea or vomiting). 30 tablet 1  . sertraline (ZOLOFT) 100 MG tablet Take 200 mg by mouth daily.     . traZODone (DESYREL) 100 MG tablet Take 25 mg by mouth at bedtime.     . Vitamin D, Ergocalciferol, (DRISDOL) 50000 UNITS CAPS capsule Take 50,000 Units by mouth every 7 (seven) days.    Marland Kitchen albuterol (VENTOLIN HFA) 108 (90 Base)  MCG/ACT inhaler 2 inhalations every 4 (four) hours as needed. (Patient not taking: Reported on 03/13/2020)    . fluticasone-salmeterol (ADVAIR HFA) 230-21 MCG/ACT inhaler Inhale into the lungs. (Patient not taking: Reported on 03/27/2020)    . lenalidomide (REVLIMID) 5 MG capsule Take 1 tab po daily for 21 days and 7 days off [employer] Auth # [id number] Date Obtained 02/23/2020 21 capsule 0   No current facility-administered medications for this visit.    Review of Systems  Constitutional: Negative for chills, diaphoresis, fever, malaise/fatigue and weight loss (up 4 lbs).       Feels "good."  HENT: Negative for congestion, ear discharge, ear pain, hearing loss, nosebleeds, sinus pain, sore throat and tinnitus.   Eyes: Negative for blurred vision and double vision.  Respiratory: Positive for cough (mornings and night). Negative for hemoptysis, sputum production and shortness of breath.        Asthma  Cardiovascular: Negative for chest pain, palpitations and leg swelling.  Gastrointestinal: Positive for blood in stool, constipation (on stool softener) and nausea (for a few days, resolved). Negative for abdominal pain, diarrhea, heartburn (on medication), melena and vomiting.       Lost appetite for a few days  Genitourinary: Negative for dysuria, frequency, hematuria and urgency.  Musculoskeletal: Negative for back pain, joint pain, myalgias and neck pain.  Skin: Negative for itching and rash.  Neurological: Negative for dizziness, tingling, sensory change, weakness and headaches.       Occasional balance problems. Traumatic brain injury (fall) 3 years ago.   Endo/Heme/Allergies: Does not bruise/bleed easily.  Psychiatric/Behavioral: Negative for depression and memory loss. The patient is not nervous/anxious and does not have insomnia.   All other systems reviewed and are negative.  Performance status (ECOG): 1  Vitals Blood pressure (!) 149/86, pulse (!) 102, temperature (!) 97.2 F (36.2  C), temperature source Tympanic, resp. rate 18, weight 213 lb 13.5 oz (97 kg), SpO2 96 %.   Physical Exam Vitals and nursing note reviewed.  Constitutional:      General: She is not in acute distress.    Appearance: Normal appearance. She is well-developed.     Interventions: Face mask in place.  HENT:     Head: Normocephalic and atraumatic.     Comments: Short styled graying hair.    Mouth/Throat:     Mouth: Mucous membranes are moist. No oral lesions.     Pharynx: Oropharynx is clear.  Eyes:     Extraocular Movements: Extraocular movements intact.     Conjunctiva/sclera:  Conjunctivae normal.     Pupils: Pupils are equal, round, and reactive to light.     Comments: Glasses. Blue eyes.  Cardiovascular:     Rate and Rhythm: Normal rate and regular rhythm.     Heart sounds: Normal heart sounds. No murmur heard.  No friction rub. No gallop.   Pulmonary:     Effort: Pulmonary effort is normal. No respiratory distress.     Comments: Extra sounds on right side. Chest:     Chest wall: No tenderness.  Abdominal:     General: Bowel sounds are normal. There is no distension.     Palpations: Abdomen is soft. There is no hepatomegaly, splenomegaly or mass.     Tenderness: There is no abdominal tenderness. There is no guarding or rebound.  Musculoskeletal:        General: No swelling or tenderness. Normal range of motion.     Cervical back: Normal range of motion and neck supple.  Lymphadenopathy:     Head:     Right side of head: No preauricular, posterior auricular or occipital adenopathy.     Left side of head: No preauricular, posterior auricular or occipital adenopathy.     Cervical: No cervical adenopathy.     Upper Body:     Right upper body: No supraclavicular or axillary adenopathy.     Left upper body: No supraclavicular or axillary adenopathy.     Lower Body: No right inguinal adenopathy. No left inguinal adenopathy.  Skin:    General: Skin is warm and dry.     Findings:  No bruising, erythema, lesion or rash.  Neurological:     Mental Status: She is alert and oriented to person, place, and time.  Psychiatric:        Behavior: Behavior normal.        Thought Content: Thought content normal.        Judgment: Judgment normal.    Appointment on 03/27/2020  Component Date Value Ref Range Status  . Magnesium 03/27/2020 1.9  1.7 - 2.4 mg/dL Final   Performed at [facility], [address] [place], [place] [address]  . Sodium 03/27/2020 139  135 - 145 mmol/L Final  . Potassium 03/27/2020 4.1  3.5 - 5.1 mmol/L Final  . Chloride 03/27/2020 107  98 - 111 mmol/L Final  . CO2 03/27/2020 23  22 - 32 mmol/L Final  . Glucose, Bld 03/27/2020 115* 70 - 99 mg/dL Final   Glucose reference range applies only to samples taken after fasting for at least 8 hours.  . BUN 03/27/2020 18  8 - 23 mg/dL Final  . Creatinine, Ser 03/27/2020 1.15* 0.44 - 1.00 mg/dL Final  . Calcium 03/27/2020 9.1  8.9 - 10.3 mg/dL Final  . Total Protein 03/27/2020 6.7  6.5 - 8.1 g/dL Final  . Albumin 03/27/2020 3.6  3.5 - 5.0 g/dL Final  . AST 03/27/2020 17  15 - 41 U/L Final  . ALT 03/27/2020 16  0 - 44 U/L Final  . Alkaline Phosphatase 03/27/2020 79  38 - 126 U/L Final  . Total Bilirubin 03/27/2020 0.4  0.3 - 1.2 mg/dL Final  . GFR, Estimated 03/27/2020 51* >60 mL/min Final   Comment: (NOTE) Calculated using the CKD-EPI Creatinine Equation (2021)   . Anion gap 03/27/2020 9  5 - 15 Final   Performed at [facility], [address], [place], [place] [address]  . WBC 03/27/2020 5.2  4.0 - 10.5 K/uL  Final  . RBC 03/27/2020 4.17  3.87 - 5.11 MIL/uL Final  . Hemoglobin 03/27/2020 13.4  12.0 - 15.0 g/dL Final  . HCT 21/03/5519 40.1  36 - 46 % Final  . MCV 03/27/2020 96.2  80.0 - 100.0 fL Final  . MCH 03/27/2020 32.1  26.0 - 34.0 pg Final  . MCHC 03/27/2020 33.4  30.0 - 36.0 g/dL Final  . RDW 80/22/3361 13.1  11.5 - 15.5 % Final  . Platelets 03/27/2020 257  150 - 400  K/uL Final  . nRBC 03/27/2020 0.0  0.0 - 0.2 % Final  . Neutrophils Relative % 03/27/2020 57  % Final  . Neutro Abs 03/27/2020 3.0  1.7 - 7.7 K/uL Final  . Lymphocytes Relative 03/27/2020 21  % Final  . Lymphs Abs 03/27/2020 1.1  0.7 - 4.0 K/uL Final  . Monocytes Relative 03/27/2020 10  % Final  . Monocytes Absolute 03/27/2020 0.5  0.1 - 1.0 K/uL Final  . Eosinophils Relative 03/27/2020 11  % Final  . Eosinophils Absolute 03/27/2020 0.6* 0.0 - 0.5 K/uL Final  . Basophils Relative 03/27/2020 1  % Final  . Basophils Absolute 03/27/2020 0.1  0.0 - 0.1 K/uL Final  . Immature Granulocytes 03/27/2020 0  % Final  . Abs Immature Granulocytes 03/27/2020 0.02  0.00 - 0.07 K/uL Final   Performed at [facility], [address], [place], [place] [address]    Assessment:  Laurie [name] is a 74 y.o. female with stage III biclonal IgA multiple myeloma.  SPEP was 5.0 gm/dL on 75/30/0511. Random urine revealed 196.6 mg/dL total protein with 02.1% M-spike.   Bone marrow biopsy on 11/13/2019 revealed a hypercellular bone marrow with extensive involvement by plasma cell neoplasm. Atypical plasma cells represented 70% of all cells in the aspirate and was associated with prominent interstitial infiltrates and diffuse sheets in the clot.  Plasma cells were kappa light chain restricted.   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     Labs on 10/03/2019 revealed a hematocrit 27.9, hemoglobin 9.4, MCV 108, platelets 287,000, WBC 3,800 (ANC 2400).   Creatinine was 0.94, albumin 3.4, and protein 11.2.  Ferritin was 82 with an iron saturation of 30% and a TIBC of 206.  Hepatitis C antibody and HIV testing were negative.    Work-up on  10/26/2019 revealed a hematocrit was 27.4, hemoglobin 9.1, MCV 109.6, platelets 250,000, WBC 3,400 (ANC 2,100).  Retic was 2.2%.  Creatinine was 1.06 and calcium 8.6. Total protein was 11.4 and albumin 3.2.  M-spike was 5.7 gm/dL biclonal IgA protein with kappa specificity.  Kappa free light chain were 1,376.3, lambda free light chains <1.5, and ratio > 917.53 (0.26-1.65).  Beta 2 microglobulin was 10.3 (0.6 - 2.4). LDH was 102. TSH was 3.673.  Hepatitis serologies were negative on 11/28/2019.  24 hour UPEP on 10/30/2019 revealed 1020 mg/24 hours protein with 65.3% M-spike (665.9 mg/24 hours).  Urine free kappa light chain were 2,620.44, free lambda light chain 6.01 and ratio 436.01 (1.03-31.76).  Bone survey on 10/26/2019 revealed small lytic lesions in the skull. There was no acute fracture.  SPEP has been followed (gm/dL): 5.7 on 11/73/5670, 3.5 on 12/27/2019, 2.3 on 01/24/2020, 1.0 on 02/28/2020, and 0.7 on 03/27/2020. Kappa  free light chains have been followed (mg/L): 1376.3 on 10/26/2019, 768 on 01/03/2020, 578.7 on 01/24/2020, 440.6 (ratio 220.30), and 306.4 (ratio 127.67 on 03/27/2020).  She is day 1 of cycle # 5 daratumumab SQ and Decadron (11/29/2019 - 03/27/2020).  She began monthly Xgeva on 12/05/2019 (last 02/28/2020).  She has B12 deficiency. Vitamin B12 was 200 (low) and folate >20.0 on 10/03/2019.  She was on weekly B12 injections x 3 and is now on monthly B12 injections (last 03/13/2020).  Antiparietal antibody and intrinsic factor antibodies were negative on 12/05/2019.  She is a Restaurant manager, fast food.  She receives Retacrit (last 12/13/2019) and IV iron support.  Ferritin was 66 on 02/28/2020.  She received Venofer on 12/05/2019, 12/13/2019, and 01/17/2020.  She received both doses of the COVID-19 vaccine in 08/2019.  Symptomatically, she has been "good." She has had a little bit of blood in her stool due to constipation.; she is taking stool softeners.  She has been  coughing.  Plan: 1.   Labs today: CBC with diff, CMP, Mg, SPEP, FLCA. 2.   Stage III multiple myeloma Patient was diagnosed with a 5.7 gm/dL biclonal IgA gammopathy with kappa specificity.                         M-spike was 0.7 today. Kappa free light chains were 1,376.3 with a ratio >917.53 (0.26-1.65) on 10/26/2019.                          Kappa free light chains were 3 of 6.4 today. Bone survey reveals lytic lesions in skull. She receives [address] (cycle length 28 days). Daratumumab SQ 1800 mg on days 1 and 15 for cycles #3 - #6 Revlimid 5 mg po day 1-21. Decadron 20 mg po days 1-2, and 15-16. Prophylaxis: Singulair 10 mg po the day before, the day of and 2 days after daratumumab. Acyclovir 400 mg po BID. Aspirin 81 mg a day. Supportive care: She receives Xgeva monthly (last 02/28/2020).   Xgeva today.             Symptomatically, she appears to be doing well.  She has been coughing.  Labs reviewed.  Day 1 of cycle #5 daratumumab postponed today secondary to respiratory symptoms.                          Chest x-ray (PA and lateral) today.  3. Lytic bone lesions   Calcium 9.1.  Albumin 3.6.  Creatinine 1.15.  Xgeva today.  Continue calcium supplementation. 4.   B12 deficiency She last received B12 on 03/13/2020.  Continue B12 monthly. 5.   Hypocalcemia, corrected  Calcium 9.1.  Continue calcium 1800 mg a day. 6.  Cough  Patient has a more prominent cough possibly related to asthma.  Exam reveals intermittent right-sided crackles.  CXR STAT today. 7.   No daratumumab today. 8.   Xgeva today. 9.   RTC in 1 week for MD assessment, labs (CBC with diff, CMP, Mg), and day 1 of cycle #5 daratumumab.  Addendum:  CXR today  revealed a 1.9 x 1.3 cm nodular opacity in the right base.  Chest CT was recommended.  The patient was contacted and a chest CT ordered.  I discussed the assessment and treatment plan with the patient.  The patient was provided an opportunity to ask questions and all were answered.  The patient agreed  with the plan and demonstrated an understanding of the instructions.  The patient was advised to call back if the symptoms worsen or if the condition fails to improve as anticipated.    Lequita Asal, MD, PhD    03/27/2020, 11:05 AM  I, Mirian Mo Tufford, am acting as Education administrator for [employer]. Mike Gip, MD, PhD.  I, Melissa C. Mike Gip, MD, have reviewed the above documentation for accuracy and completeness, and I agree with the above.

## 2020-03-27 ENCOUNTER — Other Ambulatory Visit: Payer: Self-pay | Admitting: *Deleted

## 2020-03-27 ENCOUNTER — Inpatient Hospital Stay: Payer: Medicare HMO

## 2020-03-27 ENCOUNTER — Other Ambulatory Visit: Payer: Self-pay | Admitting: Hematology and Oncology

## 2020-03-27 ENCOUNTER — Ambulatory Visit
Admission: RE | Admit: 2020-03-27 | Discharge: 2020-03-27 | Disposition: A | Discharge status: Home / Self Care | Payer: Medicare HMO | Source: Ambulatory Visit | Attending: Hematology and Oncology | Admitting: Hematology and Oncology

## 2020-03-27 ENCOUNTER — Telehealth: Payer: Self-pay | Admitting: *Deleted

## 2020-03-27 ENCOUNTER — Encounter: Payer: Self-pay | Admitting: Hematology and Oncology

## 2020-03-27 ENCOUNTER — Inpatient Hospital Stay: Payer: Medicare HMO | Admitting: Hematology and Oncology

## 2020-03-27 ENCOUNTER — Ambulatory Visit
Admission: RE | Admit: 2020-03-27 | Discharge: 2020-03-27 | Disposition: A | Discharge status: Home / Self Care | Payer: Medicare HMO | Attending: Hematology and Oncology | Admitting: Hematology and Oncology

## 2020-03-27 ENCOUNTER — Other Ambulatory Visit: Payer: Self-pay

## 2020-03-27 VITALS — BP 148/64 | HR 112 | Resp 18

## 2020-03-27 VITALS — BP 149/86 | HR 102 | Temp 97.2°F | Resp 18 | Wt 213.8 lb

## 2020-03-27 DIAGNOSIS — Z5112 Encounter for antineoplastic immunotherapy: Secondary | ICD-10-CM

## 2020-03-27 DIAGNOSIS — R059 Cough, unspecified: Secondary | ICD-10-CM | POA: Diagnosis not present

## 2020-03-27 DIAGNOSIS — D472 Monoclonal gammopathy: Secondary | ICD-10-CM

## 2020-03-27 DIAGNOSIS — R911 Solitary pulmonary nodule: Secondary | ICD-10-CM | POA: Diagnosis present

## 2020-03-27 DIAGNOSIS — C9000 Multiple myeloma not having achieved remission: Secondary | ICD-10-CM

## 2020-03-27 DIAGNOSIS — E538 Deficiency of other specified B group vitamins: Secondary | ICD-10-CM | POA: Diagnosis not present

## 2020-03-27 DIAGNOSIS — D649 Anemia, unspecified: Secondary | ICD-10-CM

## 2020-03-27 LAB — COMPREHENSIVE METABOLIC PANEL
ALT: 16 U/L (ref 0–44)
AST: 17 U/L (ref 15–41)
Albumin: 3.6 g/dL (ref 3.5–5.0)
Alkaline Phosphatase: 79 U/L (ref 38–126)
Anion gap: 9 (ref 5–15)
BUN: 18 mg/dL (ref 8–23)
CO2: 23 mmol/L (ref 22–32)
Calcium: 9.1 mg/dL (ref 8.9–10.3)
Chloride: 107 mmol/L (ref 98–111)
Creatinine, Ser: 1.15 mg/dL — ABNORMAL HIGH (ref 0.44–1.00)
GFR, Estimated: 51 mL/min — ABNORMAL LOW (ref >60)
Glucose, Bld: 115 mg/dL — ABNORMAL HIGH (ref 70–99)
Potassium: 4.1 mmol/L (ref 3.5–5.1)
Sodium: 139 mmol/L (ref 135–145)
Total Bilirubin: 0.4 mg/dL (ref 0.3–1.2)
Total Protein: 6.7 g/dL (ref 6.5–8.1)

## 2020-03-27 LAB — CBC WITH DIFFERENTIAL/PLATELET
Abs Immature Granulocytes: 0.02 10*3/uL (ref 0.00–0.07)
Basophils Absolute: 0.1 10*3/uL (ref 0.0–0.1)
Basophils Relative: 1 %
Eosinophils Absolute: 0.6 10*3/uL — ABNORMAL HIGH (ref 0.0–0.5)
Eosinophils Relative: 11 %
HCT: 40.1 % (ref 36.0–46.0)
Hemoglobin: 13.4 g/dL (ref 12.0–15.0)
Immature Granulocytes: 0 %
Lymphocytes Relative: 21 %
Lymphs Abs: 1.1 10*3/uL (ref 0.7–4.0)
MCH: 32.1 pg (ref 26.0–34.0)
MCHC: 33.4 g/dL (ref 30.0–36.0)
MCV: 96.2 fL (ref 80.0–100.0)
Monocytes Absolute: 0.5 10*3/uL (ref 0.1–1.0)
Monocytes Relative: 10 %
Neutro Abs: 3 10*3/uL (ref 1.7–7.7)
Neutrophils Relative %: 57 %
Platelets: 257 10*3/uL (ref 150–400)
RBC: 4.17 MIL/uL (ref 3.87–5.11)
RDW: 13.1 % (ref 11.5–15.5)
WBC: 5.2 10*3/uL (ref 4.0–10.5)
nRBC: 0 % (ref 0.0–0.2)

## 2020-03-27 LAB — MAGNESIUM: Magnesium: 1.9 mg/dL (ref 1.7–2.4)

## 2020-03-27 MED ORDER — LENALIDOMIDE 5 MG PO CAPS: ORAL_CAPSULE | ORAL | 0 refills | Status: DC

## 2020-03-27 MED ORDER — DENOSUMAB 120 MG/1.7ML ~~LOC~~ SOLN
120.0000 mg | Freq: Once | SUBCUTANEOUS | Status: AC
  Administered 2020-03-27: 120 mg via SUBCUTANEOUS

## 2020-03-27 NOTE — Progress Notes (Signed)
Pt tolerated xgeva injection today.  Pt left chemo suite stable and ambulatory.

## 2020-03-27 NOTE — Telephone Encounter (Signed)
Dr. Mike Gip is aware and she has spoken to the patient regarding these test results

## 2020-03-27 NOTE — Patient Instructions (Signed)
  Cycle #5 postponed today.  Do not start Revlimid until MD approves.

## 2020-03-27 NOTE — Telephone Encounter (Signed)
Called report  EXAM: CHEST - 2 VIEW  COMPARISON:  November 25, 2019  FINDINGS: There is a nodular appearing opacity either in or overlying the left base measuring 1.9 x 1.3 cm. Lungs elsewhere are clear. Heart size and pulmonary vascularity are normal. No adenopathy. Apparent hiatal hernia. No blastic or lytic bone lesions appreciable.  IMPRESSION: Nodular opacity right base measuring 1.9 x 1.3 cm. Advise chest CT to further assess this area. Lungs elsewhere clear. Heart size normal. Hiatal hernia evident. No adenopathy appreciable.  These results will be called to the ordering clinician or representative by the Radiologist Assistant, and communication documented in the PACS or [employer].   Electronically Signed   By: Lowella Grip III M.D.   On: 03/27/2020 10:38

## 2020-03-27 NOTE — Telephone Encounter (Signed)
Contacted biologics. Patient has not yet received Revlimid prescription. reviewed chart with Dr. Mike Gip and her nursing team. Prescription was not yet sent to biologics. Rems survey completed and new script sent to biologics. I personally reached out to biologics. Spoke with [employer]. She ensure me that patient's shipment would be sent to patient's home by Friday this week.

## 2020-03-27 NOTE — Progress Notes (Signed)
Patient states she has a little bit of cough related to asthma. She has had some nausea. No other concerns. Patient states she has not received revlimid.

## 2020-03-29 LAB — PROTEIN ELECTROPHORESIS, SERUM
A/G Ratio: 1.1 (ref 0.7–1.7)
Albumin ELP: 3.3 g/dL (ref 2.9–4.4)
Alpha-1-Globulin: 0.2 g/dL (ref 0.0–0.4)
Alpha-2-Globulin: 0.7 g/dL (ref 0.4–1.0)
Beta Globulin: 0.8 g/dL (ref 0.7–1.3)
Gamma Globulin: 1.3 g/dL (ref 0.4–1.8)
Globulin, Total: 3 g/dL (ref 2.2–3.9)
M-Spike, %: 0.7 g/dL — ABNORMAL HIGH
Total Protein ELP: 6.3 g/dL (ref 6.0–8.5)

## 2020-03-29 LAB — KAPPA/LAMBDA LIGHT CHAINS
Kappa free light chain: 306.4 mg/L — ABNORMAL HIGH (ref 3.3–19.4)
Kappa, lambda light chain ratio: 127.67 — ABNORMAL HIGH (ref 0.26–1.65)
Lambda free light chains: 2.4 mg/L — ABNORMAL LOW (ref 5.7–26.3)

## 2020-04-02 NOTE — Progress Notes (Signed)
[facility]  [address] [place], [place] [address] Phone: [phone]  Fax: [phone]   Clinic Day:  04/03/2020  Referring physician: Denton Lank, MD  Chief Complaint: Laurie [name] is a 74 y.o. female with multiple myeloma who is seen for assessment prior to day 1 of cycle #5 daratumumab, Revlimid and Decadron.  HPI: The patient was last seen in the hematology clinic on 03/27/2020. At that time, she felt "good".  She had a cough.  Hematocrit was 40.1, hemoglobin 13.4, platelets 257,000, WBC 5,200. Creatinine was 1.15 (CrCl 51 ml/min). M spike was 0.7 gm/dL. Kappa free light chain were 306.4, lambda free light chains 2.4, and ratio 127.67. Magnesium was 1.9.   CXR on 03/27/2020 revealed a nodular opacity in the right base measuring 1.9 x 1.3 cm. Lungs were clear elsewhere. Hiatal hernia was evident.   Decision was made to hold chemotherapy. She received monthly Xgeva.  Chest CT without contrast on 03/27/2020 revealed numerous healed bilateral rib fractures. There were no acute pulmonary findings or worrisome pulmonary lesions. The abnormality on the chest x-ray was due to a healed rib fracture with callus formation. There was no mediastinal or hilar mass or adenopathy. There was a moderate-sized hiatal hernia. Three was aortic atherosclerosis.   During the interim, she has been good. She still has a cough but has not had to take Robitussin for it lately. She still has some constipation with small amounts of blood in the stool. Her appetite has improved.  The patient fell into some brush two days ago. She fell on her bottom and did not hit her head.  The patient received Revlimid and is going to start it today.   Past Medical History:  Diagnosis Date  . Anxiety   . Asthma   . Depression   . Head injury   . Osteoporosis     Past Surgical History:  Procedure Laterality Date  . ABDOMINAL HYSTERECTOMY    . HEMORROIDECTOMY    .  SEPTOPLASTY      Family History  Problem Relation Age of Onset  . Breast cancer Cousin        mat cousin    Social History:  reports that she has never smoked. She has never used smokeless tobacco. She reports that she does not drink alcohol and does not use drugs. She denies any exposure to radiation or toxins. She denies alcohol and tobacco use. Her husband has schizophrenia and committed suicide. The patient is retired and has not worked since 26. 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  She then worked in the [employer] x 5 years until disability at age 75.  She lives in [place], [place]. She has a Neurosurgeon named Phoebe.  She is a Sales promotion account executive [employer], and will not accept blood products.  The patient is alone today.  Allergies:  Allergies  Allergen Reactions  . Levofloxacin     Critical   . Other Other (See Comments)    Antihistamines    Current Medications: Current Outpatient Medications  Medication Sig Dispense Refill  . acyclovir (ZOVIRAX) 400 MG tablet Take 1 tablet (400 mg total) by mouth 2 (two) times daily. 60 tablet 11  . alendronate (FOSAMAX) 70 MG tablet     . aspirin 81 MG chewable tablet Chew by mouth daily.    Marland Kitchen atorvastatin (LIPITOR) 20 MG tablet Take by mouth.    . busPIRone (BUSPAR)  10 MG tablet Take 10 mg by mouth 2 (two) times daily.    . Calcium Carb-Cholecalciferol (CALCIUM 600 + D PO) Take 1,200 mg by mouth in the morning and at bedtime.     Marland Kitchen dexamethasone (DECADRON) 4 MG tablet 5 TAB BY MOUTH ONCE A WEEK. 5 TAB WEEKLY THE DAY AFTER DARATUMUMAB X 8 WEEKS. TAKE WITH BREAKFAST. 40 tablet 0  . docusate sodium (COLACE) 100 MG capsule Take 100 mg by mouth daily.    Marland Kitchen lenalidomide (REVLIMID) 5 MG capsule Take 1 tab po daily for 21 days and 7 days off [employer] Auth # [id number] Date Obtained 02/23/2020 21 capsule 0  . montelukast (SINGULAIR) 10 MG tablet Take 10 mg by mouth at bedtime.    . naproxen (NAPROSYN) 500 MG  tablet Take 500 mg by mouth 2 (two) times daily with a meal.    . nortriptyline (PAMELOR) 25 MG capsule Take by mouth.    Marland Kitchen omeprazole (PRILOSEC) 20 MG capsule Take 20 mg by mouth daily.    . ondansetron (ZOFRAN) 8 MG tablet Take 1 tablet (8 mg total) by mouth every 8 (eight) hours as needed (Nausea or vomiting). 30 tablet 1  . sertraline (ZOLOFT) 100 MG tablet Take 200 mg by mouth daily.     . traZODone (DESYREL) 100 MG tablet Take 25 mg by mouth at bedtime.     . Vitamin D, Ergocalciferol, (DRISDOL) 50000 UNITS CAPS capsule Take 50,000 Units by mouth every 7 (seven) days.    Marland Kitchen albuterol (VENTOLIN HFA) 108 (90 Base) MCG/ACT inhaler 2 inhalations every 4 (four) hours as needed. (Patient not taking: Reported on 03/13/2020)    . fluticasone-salmeterol (ADVAIR HFA) 230-21 MCG/ACT inhaler Inhale into the lungs. (Patient not taking: Reported on 03/27/2020)     No current facility-administered medications for this visit.    Review of Systems  Constitutional: Negative for chills, diaphoresis, fever, malaise/fatigue and weight loss (stable).  HENT: Negative for congestion, ear discharge, ear pain, hearing loss, nosebleeds, sinus pain, sore throat and tinnitus.   Eyes: Negative for blurred vision and double vision.  Respiratory: Positive for cough (mornings and night). Negative for hemoptysis, sputum production and shortness of breath.        Asthma  Cardiovascular: Negative for chest pain, palpitations and leg swelling.  Gastrointestinal: Positive for blood in stool and constipation (on stool softener). Negative for abdominal pain, diarrhea, heartburn (on medication), melena, nausea and vomiting.       Appetite is better  Genitourinary: Negative for dysuria, frequency, hematuria and urgency.  Musculoskeletal: Positive for falls (2 days ago). Negative for back pain, joint pain, myalgias and neck pain.  Skin: Negative for itching and rash.  Neurological: Negative for dizziness, tingling, sensory  change, weakness and headaches.       Occasional balance problems. Traumatic brain injury (fall) 3 years ago.   Endo/Heme/Allergies: Does not bruise/bleed easily.  Psychiatric/Behavioral: Negative for depression and memory loss. The patient is not nervous/anxious and does not have insomnia.   All other systems reviewed and are negative.  Performance status (ECOG): 1  Vitals Blood pressure 134/79, pulse (!) 104, temperature (!) 97.2 F (36.2 C), temperature source Tympanic, resp. rate 18, weight 213 lb 13.5 oz (97 kg), SpO2 98 %.   Physical Exam Vitals and nursing note reviewed.  Constitutional:      General: She is not in acute distress.    Appearance: Normal appearance. She is well-developed.     Interventions: Face mask in place.  HENT:     Head: Normocephalic and atraumatic.     Comments: Short gray hair.    Mouth/Throat:     Mouth: Mucous membranes are moist. No oral lesions.     Pharynx: Oropharynx is clear.  Eyes:     Extraocular Movements: Extraocular movements intact.     Conjunctiva/sclera: Conjunctivae normal.     Pupils: Pupils are equal, round, and reactive to light.     Comments: Glasses. Blue eyes.  Cardiovascular:     Rate and Rhythm: Normal rate and regular rhythm.     Heart sounds: Normal heart sounds. No murmur heard. No friction rub. No gallop.   Pulmonary:     Effort: Pulmonary effort is normal. No respiratory distress.  Chest:     Chest wall: No tenderness.  Breasts:     Right: No axillary adenopathy or supraclavicular adenopathy.     Left: No axillary adenopathy or supraclavicular adenopathy.    Abdominal:     General: Bowel sounds are normal. There is no distension.     Palpations: Abdomen is soft. There is no hepatomegaly, splenomegaly or mass.     Tenderness: There is no abdominal tenderness. There is no guarding or rebound.  Musculoskeletal:        General: No swelling or tenderness. Normal range of motion.     Cervical back: Normal range of  motion and neck supple.  Lymphadenopathy:     Head:     Right side of head: No preauricular, posterior auricular or occipital adenopathy.     Left side of head: No preauricular, posterior auricular or occipital adenopathy.     Cervical: No cervical adenopathy.     Upper Body:     Right upper body: No supraclavicular or axillary adenopathy.     Left upper body: No supraclavicular or axillary adenopathy.     Lower Body: No right inguinal adenopathy. No left inguinal adenopathy.  Skin:    General: Skin is warm and dry.     Findings: No bruising, erythema, lesion or rash.  Neurological:     Mental Status: She is alert and oriented to person, place, and time.  Psychiatric:        Behavior: Behavior normal.        Thought Content: Thought content normal.        Judgment: Judgment normal.    Appointment on 04/03/2020  Component Date Value Ref Range Status  . Magnesium 04/03/2020 1.9  1.7 - 2.4 mg/dL Final   Performed at [facility], [address] [place], [place] [address]  . Sodium 04/03/2020 138  135 - 145 mmol/L Final  . Potassium 04/03/2020 4.3  3.5 - 5.1 mmol/L Final  . Chloride 04/03/2020 104  98 - 111 mmol/L Final  . CO2 04/03/2020 24  22 - 32 mmol/L Final  . Glucose, Bld 04/03/2020 94  70 - 99 mg/dL Final   Glucose reference range applies only to samples taken after fasting for at least 8 hours.  . BUN 04/03/2020 23  8 - 23 mg/dL Final  . Creatinine, Ser 04/03/2020 1.03* 0.44 - 1.00 mg/dL Final  . Calcium 04/03/2020 8.6* 8.9 - 10.3 mg/dL Final  . Total Protein 04/03/2020 7.0  6.5 - 8.1 g/dL Final  . Albumin 04/03/2020 3.8  3.5 - 5.0 g/dL Final  . AST 04/03/2020 16  15 - 41 U/L Final  . ALT 04/03/2020 16  0 - 44 U/L Final  . Alkaline Phosphatase 04/03/2020 71  38 -  126 U/L Final  . Total Bilirubin 04/03/2020 0.4  0.3 - 1.2 mg/dL Final  . GFR, Estimated 04/03/2020 58* >60 mL/min Final   Comment: (NOTE) Calculated using the CKD-EPI Creatinine Equation (2021)    . Anion gap 04/03/2020 10  5 - 15 Final   Performed at [facility], [address], [place], [place] [address]  . WBC 04/03/2020 6.3  4.0 - 10.5 K/uL Final  . RBC 04/03/2020 4.26  3.87 - 5.11 MIL/uL Final  . Hemoglobin 04/03/2020 13.6  12.0 - 15.0 g/dL Final  . HCT 04/03/2020 41.1  36 - 46 % Final  . MCV 04/03/2020 96.5  80.0 - 100.0 fL Final  . MCH 04/03/2020 31.9  26.0 - 34.0 pg Final  . MCHC 04/03/2020 33.1  30.0 - 36.0 g/dL Final  . RDW 04/03/2020 13.7  11.5 - 15.5 % Final  . Platelets 04/03/2020 357  150 - 400 K/uL Final  . nRBC 04/03/2020 0.0  0.0 - 0.2 % Final  . Neutrophils Relative % 04/03/2020 68  % Final  . Neutro Abs 04/03/2020 4.4  1.7 - 7.7 K/uL Final  . Lymphocytes Relative 04/03/2020 17  % Final  . Lymphs Abs 04/03/2020 1.0  0.7 - 4.0 K/uL Final  . Monocytes Relative 04/03/2020 6  % Final  . Monocytes Absolute 04/03/2020 0.4  0.1 - 1.0 K/uL Final  . Eosinophils Relative 04/03/2020 7  % Final  . Eosinophils Absolute 04/03/2020 0.4  0.0 - 0.5 K/uL Final  . Basophils Relative 04/03/2020 1  % Final  . Basophils Absolute 04/03/2020 0.1  0.0 - 0.1 K/uL Final  . Immature Granulocytes 04/03/2020 1  % Final  . Abs Immature Granulocytes 04/03/2020 0.03  0.00 - 0.07 K/uL Final   Performed at [facility], [address], [place], [place] [address]    Assessment:  GALI SPINNEY is a 74 y.o. female with stage III biclonal IgA multiple myeloma.  SPEP was 5.0 gm/dL on 10/03/2019. Random urine revealed 196.6 mg/dL total protein with 58.3% M-spike.   Bone marrow biopsy on 11/13/2019 revealed a hypercellular bone marrow with extensive involvement by plasma cell neoplasm. Atypical plasma cells represented 70% of all cells in the aspirate and was associated with prominent interstitial infiltrates and diffuse sheets in the clot.  Plasma cells were kappa light chain restricted.   Cytogenetics revealed 54~56, XX, +1, der(1;14)(q10;q10), +3, +5, +7, add (8)(p21),  +9, +9, +11, add (11)(p14), add(12)(q24.2), +15, +15, +19, +21[cp8]/46,XX[12].  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     Labs on 10/03/2019 revealed a hematocrit 27.9, hemoglobin 9.4, MCV 108, platelets 287,000, WBC 3,800 (ANC 2400).   Creatinine was 0.94, albumin 3.4, and protein 11.2.  Ferritin was 82 with an iron saturation of 30% and a TIBC of 206.  Hepatitis C antibody and HIV testing were negative.    Work-up on 10/26/2019 revealed a hematocrit was 27.4, hemoglobin 9.1, MCV 109.6, platelets 250,000, WBC 3,400 (ANC 2,100).  Retic was 2.2%.  Creatinine was 1.06 and calcium 8.6. Total protein was 11.4 and albumin 3.2.  M-spike was 5.7 gm/dL biclonal IgA protein with kappa specificity.  Kappa free light chain were 1,376.3, lambda free light chains <1.5, and ratio > 917.53 (0.26-1.65).  Beta 2 microglobulin was 10.3 (0.6 - 2.4). LDH was 102. TSH was 3.673.  Hepatitis serologies were negative on 11/28/2019.  24 hour UPEP on 10/30/2019 revealed 1020 mg/24 hours protein with 65.3% M-spike (665.9 mg/24 hours).  Urine free kappa light chain were 2,620.44, free lambda light chain 6.01 and ratio 436.01 (1.03-31.76).  Bone survey on 10/26/2019 revealed small lytic lesions in the skull. There was no acute fracture.  SPEP has been followed (gm/dL): 5.7 on 10/26/2019, 3.5 on 12/27/2019, 2.3 on 01/24/2020, 1.0 on 02/28/2020, and 0.7 on 03/27/2020. Kappa free light chains have been followed (mg/L): 1376.3 on 10/26/2019, 768 on 01/03/2020, 578.7 on 01/24/2020, 440.6 (ratio 220.30), and 306.4 (ratio 127.67) on 03/27/2020.  She is day 1 of cycle # 5 daratumumab SQ and Decadron (11/29/2019 - 04/03/2020).  She began monthly Xgeva on 12/05/2019 (last 03/27/2020).  She has B12 deficiency. Vitamin B12 was 200 (low) and folate >20.0 on 10/03/2019.  She was on weekly B12 injections and is now on  monthly B12 injections (last 03/13/2020).  Antiparietal antibody and intrinsic factor antibodies were negative on 12/05/2019.  Chest CT without contrast on 03/27/2020 revealed numerous healed bilateral rib fractures. CXR abnormality was due to a healed rib fracture with callus formation.  She is a Restaurant manager, fast food.  She receives Retacrit (last 12/13/2019) and IV iron support.  Ferritin was 66 on 02/28/2020.  She received Venofer on 12/05/2019, 12/13/2019, and 01/17/2020.  She received both doses of the COVID-19 vaccine in 08/2019.  Symptomatically, she has felt good. She has a cough. She shas some constipation with small amounts of blood in the stool. Appetite has improved.  Plan: 1.   Labs today: CBC with diff, CMP, Mg. 2.   Stage III multiple myeloma Patient was diagnosed with a 5.7 gm/dL biclonal IgA gammopathy with kappa specificity.                         M-spike was 0.7 on 03/27/2020. Kappa free light chains were 1,376.3 with a ratio >917.53 (0.26-1.65) on 10/26/2019.                          Kappa free light chains were 306.4 on 03/27/2020. Bone survey reveals lytic lesions in skull. She receives [address] (cycle length 28 days). Daratumumab SQ 1800 mg on days 1 and 15 for cycles #3 - #6 Revlimid 5 mg po day 1-21. Decadron 20 mg po days 1-2, and 15-16. Prophylaxis: Singulair 10 mg po the day before, the day of and 2 days after daratumumab. Acyclovir 400 mg po BID. Aspirin 81 mg a day. Supportive care: She receives Xgeva monthly (last 03/27/2020).             Symptomatically, she is doing well.  Labs reviewed.  Day 1 of cycle #5 daratumumab today.                          Begin Revlimid day 1 of 21 today.   Discuss symptom management.   She has antiemetics at home to use on a prn bases.  Interventions are adequate.     3.   Lytic bone lesions  Continue calcium and vitamin D.    Patient receives Xgeva monthly (last 03/27/2020). 4.   B12 deficiency She receives B12 monthly (last 03/13/2020).  Folate was >20 on 10/03/2019. 5.   Hypocalcemia  Calcium 8.6 (corrected 876).  Continue calcium 1800 mg a day. 6.   Nodular opacity in right lung base  Review chest CT from 03/27/2020.   The  abnormality on chest x-ray was dual to a healed rib fracture with callus formation.   There was no pulmonary findings, mediastinal or hilar adenopathy. 7.   Day 1 of cycle #5 daratumumab today. 8.   RTC in 2 weeks for MD assessment, labs (CBC with diff, CMP, Mg), B12, and day 15 of cycle #5 daratumumab.  I discussed the assessment and treatment plan with the patient.  The patient was provided an opportunity to ask questions and all were answered.  The patient agreed with the plan and demonstrated an understanding of the instructions.  The patient was advised to call back if the symptoms worsen or if the condition fails to improve as anticipated.   Lequita Asal, MD, PhD    04/03/2020, 9:35 AM  I, Mirian Mo Tufford, am acting as Education administrator for [employer]. Mike Gip, MD, PhD.  I, Brette Cast C. Mike Gip, MD, have reviewed the above documentation for accuracy and completeness, and I agree with the above.

## 2020-04-03 ENCOUNTER — Other Ambulatory Visit: Payer: Self-pay

## 2020-04-03 ENCOUNTER — Encounter: Payer: Self-pay | Admitting: Hematology and Oncology

## 2020-04-03 ENCOUNTER — Inpatient Hospital Stay: Payer: Medicare HMO | Attending: Hematology and Oncology

## 2020-04-03 ENCOUNTER — Inpatient Hospital Stay (HOSPITAL_BASED_OUTPATIENT_CLINIC_OR_DEPARTMENT_OTHER): Payer: Medicare HMO | Admitting: Hematology and Oncology

## 2020-04-03 ENCOUNTER — Inpatient Hospital Stay: Payer: Medicare HMO

## 2020-04-03 VITALS — BP 134/79 | HR 104 | Temp 97.2°F | Resp 18 | Wt 213.8 lb

## 2020-04-03 VITALS — BP 134/81 | HR 90

## 2020-04-03 DIAGNOSIS — E538 Deficiency of other specified B group vitamins: Secondary | ICD-10-CM | POA: Diagnosis present

## 2020-04-03 DIAGNOSIS — Z90710 Acquired absence of both cervix and uterus: Secondary | ICD-10-CM | POA: Insufficient documentation

## 2020-04-03 DIAGNOSIS — R059 Cough, unspecified: Secondary | ICD-10-CM

## 2020-04-03 DIAGNOSIS — Z79899 Other long term (current) drug therapy: Secondary | ICD-10-CM | POA: Diagnosis not present

## 2020-04-03 DIAGNOSIS — Z5112 Encounter for antineoplastic immunotherapy: Secondary | ICD-10-CM

## 2020-04-03 DIAGNOSIS — M899 Disorder of bone, unspecified: Secondary | ICD-10-CM | POA: Diagnosis not present

## 2020-04-03 DIAGNOSIS — C9000 Multiple myeloma not having achieved remission: Secondary | ICD-10-CM

## 2020-04-03 DIAGNOSIS — Z803 Family history of malignant neoplasm of breast: Secondary | ICD-10-CM | POA: Diagnosis not present

## 2020-04-03 LAB — CBC WITH DIFFERENTIAL/PLATELET
Abs Immature Granulocytes: 0.03 10*3/uL (ref 0.00–0.07)
Basophils Absolute: 0.1 10*3/uL (ref 0.0–0.1)
Basophils Relative: 1 %
Eosinophils Absolute: 0.4 10*3/uL (ref 0.0–0.5)
Eosinophils Relative: 7 %
HCT: 41.1 % (ref 36.0–46.0)
Hemoglobin: 13.6 g/dL (ref 12.0–15.0)
Immature Granulocytes: 1 %
Lymphocytes Relative: 17 %
Lymphs Abs: 1 10*3/uL (ref 0.7–4.0)
MCH: 31.9 pg (ref 26.0–34.0)
MCHC: 33.1 g/dL (ref 30.0–36.0)
MCV: 96.5 fL (ref 80.0–100.0)
Monocytes Absolute: 0.4 10*3/uL (ref 0.1–1.0)
Monocytes Relative: 6 %
Neutro Abs: 4.4 10*3/uL (ref 1.7–7.7)
Neutrophils Relative %: 68 %
Platelets: 357 10*3/uL (ref 150–400)
RBC: 4.26 MIL/uL (ref 3.87–5.11)
RDW: 13.7 % (ref 11.5–15.5)
WBC: 6.3 10*3/uL (ref 4.0–10.5)
nRBC: 0 % (ref 0.0–0.2)

## 2020-04-03 LAB — COMPREHENSIVE METABOLIC PANEL
ALT: 16 U/L (ref 0–44)
AST: 16 U/L (ref 15–41)
Albumin: 3.8 g/dL (ref 3.5–5.0)
Alkaline Phosphatase: 71 U/L (ref 38–126)
Anion gap: 10 (ref 5–15)
BUN: 23 mg/dL (ref 8–23)
CO2: 24 mmol/L (ref 22–32)
Calcium: 8.6 mg/dL — ABNORMAL LOW (ref 8.9–10.3)
Chloride: 104 mmol/L (ref 98–111)
Creatinine, Ser: 1.03 mg/dL — ABNORMAL HIGH (ref 0.44–1.00)
GFR, Estimated: 58 mL/min — ABNORMAL LOW (ref >60)
Glucose, Bld: 94 mg/dL (ref 70–99)
Potassium: 4.3 mmol/L (ref 3.5–5.1)
Sodium: 138 mmol/L (ref 135–145)
Total Bilirubin: 0.4 mg/dL (ref 0.3–1.2)
Total Protein: 7 g/dL (ref 6.5–8.1)

## 2020-04-03 LAB — MAGNESIUM: Magnesium: 1.9 mg/dL (ref 1.7–2.4)

## 2020-04-03 MED ORDER — DARATUMUMAB-HYALURONIDASE-FIHJ 1800-30000 MG-UT/15ML ~~LOC~~ SOLN
1800.0000 mg | Freq: Once | SUBCUTANEOUS | Status: AC
  Administered 2020-04-03: 1800 mg via SUBCUTANEOUS
  Filled 2020-04-03: qty 15

## 2020-04-03 MED ORDER — DIPHENHYDRAMINE HCL 25 MG PO CAPS
50.0000 mg | ORAL_CAPSULE | Freq: Once | ORAL | Status: AC
  Administered 2020-04-03: 50 mg via ORAL
  Filled 2020-04-03: qty 2

## 2020-04-03 MED ORDER — ACETAMINOPHEN 325 MG PO TABS
650.0000 mg | ORAL_TABLET | Freq: Once | ORAL | Status: AC
  Administered 2020-04-03: 650 mg via ORAL
  Filled 2020-04-03: qty 2

## 2020-04-03 MED ORDER — ONDANSETRON HCL 4 MG PO TABS
8.0000 mg | ORAL_TABLET | Freq: Once | ORAL | Status: AC
  Administered 2020-04-03: 8 mg via ORAL
  Filled 2020-04-03: qty 2

## 2020-04-03 MED ORDER — DEXAMETHASONE 4 MG PO TABS
20.0000 mg | ORAL_TABLET | Freq: Once | ORAL | Status: AC
  Administered 2020-04-03: 20 mg via ORAL
  Filled 2020-04-03: qty 5

## 2020-04-03 NOTE — Progress Notes (Signed)
Patient received prescribed treatment in clinic. Tolerated well. Patient stable at discharge. 

## 2020-04-10 ENCOUNTER — Ambulatory Visit: Payer: Medicare HMO

## 2020-04-10 ENCOUNTER — Other Ambulatory Visit: Payer: Medicare HMO

## 2020-04-10 ENCOUNTER — Ambulatory Visit: Payer: Medicare HMO | Admitting: Hematology and Oncology

## 2020-04-12 ENCOUNTER — Other Ambulatory Visit: Payer: Self-pay
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
## 2020-04-17 ENCOUNTER — Inpatient Hospital Stay: Payer: Medicare HMO | Admitting: Hematology and Oncology

## 2020-04-17 ENCOUNTER — Encounter: Payer: Self-pay | Admitting: Hematology and Oncology

## 2020-04-17 ENCOUNTER — Inpatient Hospital Stay: Payer: Medicare HMO

## 2020-04-17 ENCOUNTER — Other Ambulatory Visit: Payer: Self-pay

## 2020-04-17 VITALS — BP 164/71 | HR 111 | Temp 96.0°F | Resp 16 | Wt 214.3 lb

## 2020-04-17 VITALS — BP 118/75 | HR 85

## 2020-04-17 DIAGNOSIS — Z5112 Encounter for antineoplastic immunotherapy: Secondary | ICD-10-CM | POA: Diagnosis not present

## 2020-04-17 DIAGNOSIS — E538 Deficiency of other specified B group vitamins: Secondary | ICD-10-CM

## 2020-04-17 DIAGNOSIS — C9000 Multiple myeloma not having achieved remission: Secondary | ICD-10-CM

## 2020-04-17 DIAGNOSIS — D649 Anemia, unspecified: Secondary | ICD-10-CM

## 2020-04-17 DIAGNOSIS — M899 Disorder of bone, unspecified: Secondary | ICD-10-CM

## 2020-04-17 LAB — CBC WITH DIFFERENTIAL/PLATELET
Abs Immature Granulocytes: 0.02 10*3/uL (ref 0.00–0.07)
Basophils Absolute: 0 10*3/uL (ref 0.0–0.1)
Basophils Relative: 1 %
Eosinophils Absolute: 0.7 10*3/uL — ABNORMAL HIGH (ref 0.0–0.5)
Eosinophils Relative: 11 %
HCT: 42.2 % (ref 36.0–46.0)
Hemoglobin: 13.8 g/dL (ref 12.0–15.0)
Immature Granulocytes: 0 %
Lymphocytes Relative: 13 %
Lymphs Abs: 0.8 10*3/uL (ref 0.7–4.0)
MCH: 31.7 pg (ref 26.0–34.0)
MCHC: 32.7 g/dL (ref 30.0–36.0)
MCV: 96.8 fL (ref 80.0–100.0)
Monocytes Absolute: 0.5 10*3/uL (ref 0.1–1.0)
Monocytes Relative: 8 %
Neutro Abs: 4.1 10*3/uL (ref 1.7–7.7)
Neutrophils Relative %: 67 %
Platelets: 228 10*3/uL (ref 150–400)
RBC: 4.36 MIL/uL (ref 3.87–5.11)
RDW: 13.8 % (ref 11.5–15.5)
WBC: 6.1 10*3/uL (ref 4.0–10.5)
nRBC: 0 % (ref 0.0–0.2)

## 2020-04-17 LAB — COMPREHENSIVE METABOLIC PANEL
ALT: 39 U/L (ref 0–44)
AST: 26 U/L (ref 15–41)
Albumin: 3.8 g/dL (ref 3.5–5.0)
Alkaline Phosphatase: 95 U/L (ref 38–126)
Anion gap: 9 (ref 5–15)
BUN: 18 mg/dL (ref 8–23)
CO2: 27 mmol/L (ref 22–32)
Calcium: 8.8 mg/dL — ABNORMAL LOW (ref 8.9–10.3)
Chloride: 102 mmol/L (ref 98–111)
Creatinine, Ser: 1.12 mg/dL — ABNORMAL HIGH (ref 0.44–1.00)
GFR, Estimated: 52 mL/min — ABNORMAL LOW (ref >60)
Glucose, Bld: 103 mg/dL — ABNORMAL HIGH (ref 70–99)
Potassium: 4.3 mmol/L (ref 3.5–5.1)
Sodium: 138 mmol/L (ref 135–145)
Total Bilirubin: 0.6 mg/dL (ref 0.3–1.2)
Total Protein: 6.8 g/dL (ref 6.5–8.1)

## 2020-04-17 LAB — MAGNESIUM: Magnesium: 2 mg/dL (ref 1.7–2.4)

## 2020-04-17 MED ORDER — ACETAMINOPHEN 325 MG PO TABS
650.0000 mg | ORAL_TABLET | Freq: Once | ORAL | Status: AC
  Administered 2020-04-17: 10:00:00 650 mg via ORAL
  Filled 2020-04-17: qty 2

## 2020-04-17 MED ORDER — DEXAMETHASONE 4 MG PO TABS
20.0000 mg | ORAL_TABLET | Freq: Once | ORAL | Status: AC
  Administered 2020-04-17: 10:00:00 20 mg via ORAL
  Filled 2020-04-17: qty 5

## 2020-04-17 MED ORDER — DIPHENHYDRAMINE HCL 25 MG PO CAPS
50.0000 mg | ORAL_CAPSULE | Freq: Once | ORAL | Status: AC
  Administered 2020-04-17: 10:00:00 50 mg via ORAL
  Filled 2020-04-17: qty 2

## 2020-04-17 MED ORDER — DARATUMUMAB-HYALURONIDASE-FIHJ 1800-30000 MG-UT/15ML ~~LOC~~ SOLN
1800.0000 mg | Freq: Once | SUBCUTANEOUS | Status: AC
  Administered 2020-04-17: 11:00:00 1800 mg via SUBCUTANEOUS
  Filled 2020-04-17: qty 15

## 2020-04-17 MED ORDER — ONDANSETRON HCL 4 MG PO TABS
8.0000 mg | ORAL_TABLET | Freq: Once | ORAL | Status: AC
  Administered 2020-04-17: 10:00:00 8 mg via ORAL
  Filled 2020-04-17: qty 2

## 2020-04-17 MED ORDER — CYANOCOBALAMIN 1000 MCG/ML IJ SOLN
1000.0000 ug | Freq: Once | INTRAMUSCULAR | Status: AC
  Administered 2020-04-17: 11:00:00 1000 ug via INTRAMUSCULAR
  Filled 2020-04-17: qty 1

## 2020-04-17 NOTE — Progress Notes (Signed)
Patient received prescribed treatment in clinic. Tolerated well. Patient stable at discharge. 

## 2020-04-17 NOTE — Progress Notes (Signed)
Patient here for oncology follow-up appointment, expresses concerns of frequent headaches and lower back pain

## 2020-04-19 ENCOUNTER — Other Ambulatory Visit: Payer: Self-pay

## 2020-04-19 ENCOUNTER — Encounter: Payer: Self-pay | Admitting: Hematology and Oncology

## 2020-04-19 DIAGNOSIS — C9000 Multiple myeloma not having achieved remission: Secondary | ICD-10-CM

## 2020-04-22 ENCOUNTER — Ambulatory Visit: Payer: Medicare HMO

## 2020-04-22 ENCOUNTER — Other Ambulatory Visit: Payer: Medicare HMO

## 2020-04-22 MED ORDER — LENALIDOMIDE 5 MG PO CAPS: ORAL_CAPSULE | ORAL | 0 refills | Status: DC

## 2020-04-24 ENCOUNTER — Other Ambulatory Visit: Payer: Self-pay

## 2020-04-24 ENCOUNTER — Inpatient Hospital Stay: Payer: Medicare HMO

## 2020-04-24 VITALS — BP 141/79 | HR 100 | Temp 97.1°F

## 2020-04-24 DIAGNOSIS — E538 Deficiency of other specified B group vitamins: Secondary | ICD-10-CM

## 2020-04-24 DIAGNOSIS — D649 Anemia, unspecified: Secondary | ICD-10-CM

## 2020-04-24 DIAGNOSIS — Z5112 Encounter for antineoplastic immunotherapy: Secondary | ICD-10-CM | POA: Diagnosis not present

## 2020-04-24 DIAGNOSIS — M899 Disorder of bone, unspecified: Secondary | ICD-10-CM

## 2020-04-24 DIAGNOSIS — C9000 Multiple myeloma not having achieved remission: Secondary | ICD-10-CM

## 2020-04-24 LAB — BASIC METABOLIC PANEL
Anion gap: 11 (ref 5–15)
BUN: 17 mg/dL (ref 8–23)
CO2: 26 mmol/L (ref 22–32)
Calcium: 8.9 mg/dL (ref 8.9–10.3)
Chloride: 102 mmol/L (ref 98–111)
Creatinine, Ser: 1.03 mg/dL — ABNORMAL HIGH (ref 0.44–1.00)
GFR, Estimated: 58 mL/min — ABNORMAL LOW (ref >60)
Glucose, Bld: 120 mg/dL — ABNORMAL HIGH (ref 70–99)
Potassium: 4 mmol/L (ref 3.5–5.1)
Sodium: 139 mmol/L (ref 135–145)

## 2020-04-24 MED ORDER — DENOSUMAB 120 MG/1.7ML ~~LOC~~ SOLN
120.0000 mg | Freq: Once | SUBCUTANEOUS | Status: AC
  Administered 2020-04-24: 15:00:00 120 mg via SUBCUTANEOUS
  Filled 2020-04-24: qty 1.7

## 2020-04-29 ENCOUNTER — Other Ambulatory Visit: Payer: Self-pay

## 2020-04-29 DIAGNOSIS — C9000 Multiple myeloma not having achieved remission: Secondary | ICD-10-CM

## 2020-05-01 ENCOUNTER — Inpatient Hospital Stay: Payer: Medicare HMO

## 2020-05-01 ENCOUNTER — Inpatient Hospital Stay: Payer: Medicare HMO | Admitting: Hematology and Oncology

## 2020-05-01 ENCOUNTER — Encounter: Payer: Self-pay | Admitting: Hematology and Oncology

## 2020-05-01 ENCOUNTER — Inpatient Hospital Stay (HOSPITAL_BASED_OUTPATIENT_CLINIC_OR_DEPARTMENT_OTHER): Payer: Medicare HMO | Admitting: Hematology and Oncology

## 2020-05-01 ENCOUNTER — Other Ambulatory Visit: Payer: Self-pay

## 2020-05-01 VITALS — BP 120/70 | HR 91

## 2020-05-01 VITALS — BP 120/76 | HR 98 | Temp 96.3°F | Resp 16 | Wt 213.8 lb

## 2020-05-01 DIAGNOSIS — Z5112 Encounter for antineoplastic immunotherapy: Secondary | ICD-10-CM

## 2020-05-01 DIAGNOSIS — M899 Disorder of bone, unspecified: Secondary | ICD-10-CM

## 2020-05-01 DIAGNOSIS — C9000 Multiple myeloma not having achieved remission: Secondary | ICD-10-CM

## 2020-05-01 DIAGNOSIS — E538 Deficiency of other specified B group vitamins: Secondary | ICD-10-CM

## 2020-05-01 LAB — COMPREHENSIVE METABOLIC PANEL
ALT: 19 U/L (ref 0–44)
AST: 15 U/L (ref 15–41)
Albumin: 4.1 g/dL (ref 3.5–5.0)
Alkaline Phosphatase: 76 U/L (ref 38–126)
Anion gap: 10 (ref 5–15)
BUN: 20 mg/dL (ref 8–23)
CO2: 25 mmol/L (ref 22–32)
Calcium: 9.2 mg/dL (ref 8.9–10.3)
Chloride: 103 mmol/L (ref 98–111)
Creatinine, Ser: 1.09 mg/dL — ABNORMAL HIGH (ref 0.44–1.00)
GFR, Estimated: 54 mL/min — ABNORMAL LOW (ref >60)
Glucose, Bld: 101 mg/dL — ABNORMAL HIGH (ref 70–99)
Potassium: 4.2 mmol/L (ref 3.5–5.1)
Sodium: 138 mmol/L (ref 135–145)
Total Bilirubin: 0.7 mg/dL (ref 0.3–1.2)
Total Protein: 7 g/dL (ref 6.5–8.1)

## 2020-05-01 LAB — CBC WITH DIFFERENTIAL/PLATELET
Abs Immature Granulocytes: 0.03 10*3/uL (ref 0.00–0.07)
Basophils Absolute: 0.1 10*3/uL (ref 0.0–0.1)
Basophils Relative: 1 %
Eosinophils Absolute: 0.5 10*3/uL (ref 0.0–0.5)
Eosinophils Relative: 8 %
HCT: 43.1 % (ref 36.0–46.0)
Hemoglobin: 14.4 g/dL (ref 12.0–15.0)
Immature Granulocytes: 1 %
Lymphocytes Relative: 18 %
Lymphs Abs: 1.1 10*3/uL (ref 0.7–4.0)
MCH: 31.4 pg (ref 26.0–34.0)
MCHC: 33.4 g/dL (ref 30.0–36.0)
MCV: 94.1 fL (ref 80.0–100.0)
Monocytes Absolute: 0.5 10*3/uL (ref 0.1–1.0)
Monocytes Relative: 8 %
Neutro Abs: 3.8 10*3/uL (ref 1.7–7.7)
Neutrophils Relative %: 64 %
Platelets: 352 10*3/uL (ref 150–400)
RBC: 4.58 MIL/uL (ref 3.87–5.11)
RDW: 13.8 % (ref 11.5–15.5)
WBC: 5.9 10*3/uL (ref 4.0–10.5)
nRBC: 0 % (ref 0.0–0.2)

## 2020-05-01 LAB — MAGNESIUM: Magnesium: 2.1 mg/dL (ref 1.7–2.4)

## 2020-05-01 MED ORDER — DIPHENHYDRAMINE HCL 25 MG PO CAPS
50.0000 mg | ORAL_CAPSULE | Freq: Once | ORAL | Status: AC
  Administered 2020-05-01: 15:00:00 50 mg via ORAL
  Filled 2020-05-01: qty 2

## 2020-05-01 MED ORDER — ONDANSETRON HCL 4 MG PO TABS
8.0000 mg | ORAL_TABLET | Freq: Once | ORAL | Status: AC
  Administered 2020-05-01: 15:00:00 8 mg via ORAL
  Filled 2020-05-01: qty 2

## 2020-05-01 MED ORDER — DEXAMETHASONE 4 MG PO TABS
20.0000 mg | ORAL_TABLET | Freq: Once | ORAL | Status: AC
  Administered 2020-05-01: 15:00:00 20 mg via ORAL
  Filled 2020-05-01: qty 5

## 2020-05-01 MED ORDER — DARATUMUMAB-HYALURONIDASE-FIHJ 1800-30000 MG-UT/15ML ~~LOC~~ SOLN
1800.0000 mg | Freq: Once | SUBCUTANEOUS | Status: AC
  Administered 2020-05-01: 16:00:00 1800 mg via SUBCUTANEOUS
  Filled 2020-05-01: qty 15

## 2020-05-01 MED ORDER — ACETAMINOPHEN 325 MG PO TABS
650.0000 mg | ORAL_TABLET | Freq: Once | ORAL | Status: AC
Start: 2020-05-01 — End: 2020-05-01
  Administered 2020-05-01: 15:00:00 650 mg via ORAL
  Filled 2020-05-01: qty 2

## 2020-05-01 NOTE — Progress Notes (Signed)
Pt received subq dara in clinic today. Tolerated well. VSS at d/c.

## 2020-05-01 NOTE — Progress Notes (Signed)
[facility]  [address], [address] [place], [place] [address] Phone: [phone]  Fax: [phone]   Clinic Day:  05/01/2020  Referring physician: Denton Lank, MD  Chief Complaint: Laurie [name] is a 74 y.o. female with multiple myeloma who is seen for assessment prior to day 1 of cycle #6 daratumumab, Revlimid and Decadron.  HPI: The patient was last seen in the hematology clinic on 04/17/2020. At that time, she felt "better".  She described intermittent nausea relieved with ondansetron.  She also noted on and off back pain possibly related to constipation.  Hematocrit was 42.2, hemoglobin 13.8, platelets 228,000, WBC 6,100 (ANC 4100). Creatinine was 1.12 (CrCl 52 ml/min). Calcium was 8.8 and albumen 3.8. She received day 15 of cycle #5 daratumumab, Revlimid and Decadron. She received a vitamin B12 injection.  BMP on 04/24/2020 revealed a creatinine of 1.03 (CrCl 58 ml/min). She received Xgeva.  During the interim, she has felt "good." Her back pain, heartburn, and constipation have resolved. She has been taking calcium. Her cough has improved. She states that her concentration has been "off" since her medications were changed a few months ago. The patient tripped over a stump and fell recently. She did not hit her head. She has not had any blood in her stool recently; it only happens when she is constipated.  She is starting Revlimid today. She has five 5 mg pills left. She is supposed to receive her Revlimid tomorrow.  Her 84 year old niece is moving in with her soon.   Past Medical History:  Diagnosis Date  . Anxiety   . Asthma   . Depression   . Head injury   . Osteoporosis     Past Surgical History:  Procedure Laterality Date  . ABDOMINAL HYSTERECTOMY    . HEMORROIDECTOMY    . SEPTOPLASTY      Family History  Problem Relation Age of Onset  . Breast cancer Cousin        mat cousin    Social History:  reports that she has never smoked.  She has never used smokeless tobacco. She reports that she does not drink alcohol and does not use drugs. She denies any exposure to radiation or toxins. She denies alcohol and tobacco use. Her husband has schizophrenia and committed suicide. The patient is retired and has not worked since 26. 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  She then worked in the [employer] x 5 years until disability at age 62.  She lives in [place], [place]. She has a Neurosurgeon named Phoebe.    Her 59 year old niece will be moving in with her soon.  She is a Sales promotion account executive [employer], and will not accept blood products.  The patient is alone today.  Allergies:  Allergies  Allergen Reactions  . Levofloxacin     Critical   . Other Other (See Comments)    Antihistamines    Current Medications: Current Outpatient Medications  Medication Sig Dispense Refill  . acyclovir (ZOVIRAX) 400 MG tablet Take 1 tablet (400 mg total) by mouth 2 (two) times daily. 60 tablet 11  . alendronate (FOSAMAX) 70 MG tablet     . aspirin 81 MG chewable tablet Chew by mouth daily.    Marland Kitchen atorvastatin (LIPITOR) 20 MG tablet Take by mouth.    . busPIRone (BUSPAR) 10 MG tablet Take 10 mg by mouth 2 (two) times daily.    Marland Kitchen  Calcium Carb-Cholecalciferol (CALCIUM 600 + D PO) Take 1,200 mg by mouth in the morning and at bedtime.     Marland Kitchen dexamethasone (DECADRON) 4 MG tablet 5 TAB BY MOUTH ONCE A WEEK. 5 TAB WEEKLY THE DAY AFTER DARATUMUMAB X 8 WEEKS. TAKE WITH BREAKFAST. 40 tablet 0  . docusate sodium (COLACE) 100 MG capsule Take 100 mg by mouth daily.    Marland Kitchen lenalidomide (REVLIMID) 5 MG capsule Take 1 tab po daily for 21 days and 7 days off [employer] Auth # [id number] Date Obtained 04/19/2020 21 capsule 0  . montelukast (SINGULAIR) 10 MG tablet Take 10 mg by mouth at bedtime.    . naproxen (NAPROSYN) 500 MG tablet Take 500 mg by mouth 2 (two) times daily with a meal.    . nortriptyline (PAMELOR) 25 MG capsule Take by  mouth.    Marland Kitchen omeprazole (PRILOSEC) 20 MG capsule Take 20 mg by mouth daily.    . ondansetron (ZOFRAN) 8 MG tablet Take 1 tablet (8 mg total) by mouth every 8 (eight) hours as needed (Nausea or vomiting). 30 tablet 1  . sertraline (ZOLOFT) 100 MG tablet Take 200 mg by mouth daily.     . traZODone (DESYREL) 100 MG tablet Take 25 mg by mouth at bedtime.     . Vitamin D, Ergocalciferol, (DRISDOL) 50000 UNITS CAPS capsule Take 50,000 Units by mouth every 7 (seven) days.    Marland Kitchen albuterol (VENTOLIN HFA) 108 (90 Base) MCG/ACT inhaler 2 inhalations every 4 (four) hours as needed. (Patient not taking: No sig reported)    . fluticasone-salmeterol (ADVAIR HFA) 230-21 MCG/ACT inhaler Inhale into the lungs. (Patient not taking: No sig reported)     No current facility-administered medications for this visit.    Review of Systems  Constitutional: Positive for weight loss (1 lb). Negative for chills, diaphoresis, fever and malaise/fatigue.       Feels "good."  HENT: Negative for congestion, ear discharge, ear pain, hearing loss, nosebleeds, sinus pain, sore throat and tinnitus.   Eyes: Negative for blurred vision and double vision.  Respiratory: Positive for cough (mornings and night, improved). Negative for hemoptysis, sputum production and shortness of breath.        Asthma.  Cardiovascular: Negative for chest pain, palpitations and leg swelling.  Gastrointestinal: Negative for abdominal pain, blood in stool, constipation, diarrhea, heartburn (on medication), melena, nausea and vomiting.  Genitourinary: Negative for dysuria, frequency, hematuria and urgency.  Musculoskeletal: Positive for falls. Negative for back pain, joint pain, myalgias and neck pain.  Skin: Negative for itching and rash.  Neurological: Negative for dizziness, tingling, sensory change, weakness and headaches.       Occasional balance problems. Traumatic brain injury (fall) 3 years ago. Concentration is "off."  Endo/Heme/Allergies: Does  not bruise/bleed easily.  Psychiatric/Behavioral: Negative for depression and memory loss. The patient is not nervous/anxious and does not have insomnia.   All other systems reviewed and are negative.  Performance status (ECOG): 1  Vitals Blood pressure 120/76, pulse 98, temperature (!) 96.3 F (35.7 C), temperature source Tympanic, resp. rate 16, weight 213 lb 13.5 oz (97 kg), SpO2 98 %.   Physical Exam Vitals and nursing note reviewed.  Constitutional:      General: She is not in acute distress.    Appearance: Normal appearance. She is well-developed.     Interventions: Face mask in place.  HENT:     Head: Normocephalic and atraumatic.     Comments: Short gray hair.    Mouth/Throat:  Mouth: Mucous membranes are moist. No oral lesions.     Pharynx: Oropharynx is clear.  Eyes:     Extraocular Movements: Extraocular movements intact.     Conjunctiva/sclera: Conjunctivae normal.     Pupils: Pupils are equal, round, and reactive to light.     Comments: Glasses. Blue eyes.  Cardiovascular:     Rate and Rhythm: Normal rate and regular rhythm.     Heart sounds: Normal heart sounds. No murmur heard. No friction rub. No gallop.   Pulmonary:     Effort: Pulmonary effort is normal. No respiratory distress.  Chest:     Chest wall: No tenderness.  Breasts:     Right: No axillary adenopathy or supraclavicular adenopathy.     Left: No axillary adenopathy or supraclavicular adenopathy.    Abdominal:     General: Bowel sounds are normal. There is no distension.     Palpations: Abdomen is soft. There is no hepatomegaly, splenomegaly or mass.     Tenderness: There is no abdominal tenderness. There is no guarding or rebound.  Musculoskeletal:        General: No swelling or tenderness. Normal range of motion.     Cervical back: Normal range of motion and neck supple.  Lymphadenopathy:     Head:     Right side of head: No preauricular, posterior auricular or occipital adenopathy.      Left side of head: No preauricular, posterior auricular or occipital adenopathy.     Cervical: No cervical adenopathy.     Upper Body:     Right upper body: No supraclavicular or axillary adenopathy.     Left upper body: No supraclavicular or axillary adenopathy.     Lower Body: No right inguinal adenopathy. No left inguinal adenopathy.  Skin:    General: Skin is warm and dry.     Findings: No bruising, erythema, lesion or rash.  Neurological:     Mental Status: She is alert and oriented to person, place, and time.  Psychiatric:        Behavior: Behavior normal.        Thought Content: Thought content normal.        Judgment: Judgment normal.    Appointment on 05/01/2020  Component Date Value Ref Range Status  . Magnesium 05/01/2020 2.1  1.7 - 2.4 mg/dL Final   Performed at [facility], [address], [place], [place] [address]  . Sodium 05/01/2020 138  135 - 145 mmol/L Final  . Potassium 05/01/2020 4.2  3.5 - 5.1 mmol/L Final  . Chloride 05/01/2020 103  98 - 111 mmol/L Final  . CO2 05/01/2020 25  22 - 32 mmol/L Final  . Glucose, Bld 05/01/2020 101* 70 - 99 mg/dL Final   Glucose reference range applies only to samples taken after fasting for at least 8 hours.  . BUN 05/01/2020 20  8 - 23 mg/dL Final  . Creatinine, Ser 05/01/2020 1.09* 0.44 - 1.00 mg/dL Final  . Calcium 05/01/2020 9.2  8.9 - 10.3 mg/dL Final  . Total Protein 05/01/2020 7.0  6.5 - 8.1 g/dL Final  . Albumin 05/01/2020 4.1  3.5 - 5.0 g/dL Final  . AST 05/01/2020 15  15 - 41 U/L Final  . ALT 05/01/2020 19  0 - 44 U/L Final  . Alkaline Phosphatase 05/01/2020 76  38 - 126 U/L Final  . Total Bilirubin 05/01/2020 0.7  0.3 - 1.2 mg/dL Final  . GFR, Estimated 05/01/2020 54* >60 mL/min Final  Comment: (NOTE) Calculated using the CKD-EPI Creatinine Equation (2021)   . Anion gap 05/01/2020 10  5 - 15 Final   Performed at [facility], [address], [place], [place] [address]  . WBC  05/01/2020 5.9  4.0 - 10.5 K/uL Final  . RBC 05/01/2020 4.58  3.87 - 5.11 MIL/uL Final  . Hemoglobin 05/01/2020 14.4  12.0 - 15.0 g/dL Final  . HCT 05/01/2020 43.1  36.0 - 46.0 % Final  . MCV 05/01/2020 94.1  80.0 - 100.0 fL Final  . MCH 05/01/2020 31.4  26.0 - 34.0 pg Final  . MCHC 05/01/2020 33.4  30.0 - 36.0 g/dL Final  . RDW 05/01/2020 13.8  11.5 - 15.5 % Final  . Platelets 05/01/2020 352  150 - 400 K/uL Final  . nRBC 05/01/2020 0.0  0.0 - 0.2 % Final  . Neutrophils Relative % 05/01/2020 64  % Final  . Neutro Abs 05/01/2020 3.8  1.7 - 7.7 K/uL Final  . Lymphocytes Relative 05/01/2020 18  % Final  . Lymphs Abs 05/01/2020 1.1  0.7 - 4.0 K/uL Final  . Monocytes Relative 05/01/2020 8  % Final  . Monocytes Absolute 05/01/2020 0.5  0.1 - 1.0 K/uL Final  . Eosinophils Relative 05/01/2020 8  % Final  . Eosinophils Absolute 05/01/2020 0.5  0.0 - 0.5 K/uL Final  . Basophils Relative 05/01/2020 1  % Final  . Basophils Absolute 05/01/2020 0.1  0.0 - 0.1 K/uL Final  . Immature Granulocytes 05/01/2020 1  % Final  . Abs Immature Granulocytes 05/01/2020 0.03  0.00 - 0.07 K/uL Final   Performed at [facility], [address], [place], [place] [address]    Assessment:  LADONNE SHARPLES is a 74 y.o. female with stage III biclonal IgA multiple myeloma.  SPEP was 5.0 gm/dL on 10/03/2019. Random urine revealed 196.6 mg/dL total protein with 58.3% M-spike.   Bone marrow biopsy on 11/13/2019 revealed a hypercellular bone marrow with extensive involvement by plasma cell neoplasm. Atypical plasma cells represented 70% of all cells in the aspirate and was associated with prominent interstitial infiltrates and diffuse sheets in the clot.  Plasma cells were kappa light chain restricted.   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  FISH revealed a gain of the long arm of chromosome 1  (CKS1B)(3R2G, 50%, normal < 8.6%) and chromosome 11q/11 (3R, 56%, not observed in validation studies).     Labs on 10/03/2019 revealed a hematocrit 27.9, hemoglobin 9.4, MCV 108, platelets 287,000, WBC 3,800 (ANC 2400).   Creatinine was 0.94, albumin 3.4, and protein 11.2.  Ferritin was 82 with an iron saturation of 30% and a TIBC of 206.  Hepatitis C antibody and HIV testing were negative.    Work-up on 10/26/2019 revealed a hematocrit was 27.4, hemoglobin 9.1, MCV 109.6, platelets 250,000, WBC 3,400 (ANC 2,100).  Retic was 2.2%.  Creatinine was 1.06 and calcium 8.6. Total protein was 11.4 and albumin 3.2.  M-spike was 5.7 gm/dL biclonal IgA protein with kappa specificity.  Kappa free light chain were 1,376.3, lambda free light chains <1.5, and ratio > 917.53 (0.26-1.65).  Beta 2 microglobulin was 10.3 (0.6 - 2.4). LDH was 102. TSH was 3.673.  Hepatitis serologies were negative on 11/28/2019.  24 hour UPEP on 10/30/2019 revealed 1020 mg/24 hours protein with 65.3% M-spike (665.9 mg/24 hours).  Urine free kappa light chain were 2,620.44, free lambda  light chain 6.01 and ratio 436.01 (1.03-31.76).  Bone survey on 10/26/2019 revealed small lytic lesions in the skull. There was no acute fracture.  SPEP has been followed (gm/dL): 5.7 on 10/26/2019, 3.5 on 12/27/2019, 2.3 on 01/24/2020, 1.0 on 02/28/2020, 0.7 on 03/27/2020, and 0.5 on 05/01/2020. Kappa free light chains have been followed (mg/L): 1376.3 on 10/26/2019, 768 on 01/03/2020, 578.7 on 01/24/2020, 440.6 (ratio 220.30), 306.4 (ratio 127.67) on 03/27/2020, and 154 (ratio 70.32) on 05/01/2020.  She is day 1 of cycle # 6 daratumumab SQ and Decadron (11/29/2019 - 05/01/2020).  She began monthly Xgeva on 12/05/2019 (last 04/24/2020).  She has B12 deficiency. Vitamin B12 was 200 (low) and folate >20.0 on 10/03/2019.  She was on weekly B12 injections and is now on monthly B12 injections (last 04/17/2020).  Antiparietal antibody and intrinsic  factor antibodies were negative on 12/05/2019.  Chest CT without contrast on 03/27/2020 revealed numerous healed bilateral rib fractures. CXR abnormality was due to a healed rib fracture with callus formation.  She is a Restaurant manager, fast food.  She receives Retacrit (last 12/13/2019) and IV iron support.  Ferritin was 66 on 02/28/2020.  She received Venofer on 12/05/2019, 12/13/2019, and 01/17/2020.  She received both doses of the COVID-19 vaccine in 08/2019.  Symptomatically, she feels "good." Her back pain, heartburn, and constipation have resolved. She has been taking calcium. Her cough has improved. She has not had any blood in her stool recently when (occurs when constipated).  Exam is stable.  Plan: 1.   Labs today: CBC with diff, CMP, Mg, SPEP, FLCA. 2.   Stage III multiple myeloma Patient was diagnosed with a 5.7 gm/dL biclonal IgA gammopathy with kappa specificity.                         M-spike is 0.5 today.  Kappa free light chains were 1,376.3 with a ratio >917.53 (0.26-1.65) on 10/26/2019.                          Kappa free light chains are 154.7 (ratio 70.32) today. Bone survey reveals lytic lesions in skull. She receives [address] (cycle length 28 days). Daratumumab SQ 1800 mg on days 1 and 15 for cycles #3 - #6 Revlimid 5 mg po day 1-21. Decadron 20 mg po days 1-2, and 15-16. Prophylaxis: Singulair 10 mg po the day before, the day of and 2 days after daratumumab. Acyclovir 400 mg po BID. Aspirin 81 mg a day. Supportive care: She receives Xgeva monthly (last 04/24/2020).             Symptomatically, she continues to feel good.  She denies any pain.  Labs reviewed.  Begin day 1 of cycle #6 daratumumab.                          Begin  Revlimid 5 mg today.   Review plan for daratumumab monthly beginning with cycle #7.    Discuss symptom management.  She has antiemeticsat home to use on a prn bases.  Interventions are adequate.    3.   Lytic bone lesions  Patient denies any bone pain.    Continue calcium and vitamin D.    Continue Xgeva monthly (last 04/24/2020). 4.   B12 deficiency Continue B12 monthly (last 04/17/2020).  Folate was >20 on 10/03/2019. 5.   Hypocalcemia, resolved  Calcium 9.2.  Continue calcium 1800 mg a  day. 6.   Day 1 of cycle #6 daratumumab. 7.   RTC in 2 weeks for MD assessment, labs (CBC with diff, CMP, Mg), and day 15 of cycle #6 daratumumab.  I discussed the assessment and treatment plan with the patient.  The patient was provided an opportunity to ask questions and all were answered.  The patient agreed with the plan and demonstrated an understanding of the instructions.  The patient was advised to call back if the symptoms worsen or if the condition fails to improve as anticipated.   Lequita Asal, MD, PhD    05/01/2020, 2:46 PM  I, Mirian Mo Tufford, am acting as Education administrator for [employer]. Mike Gip, MD, PhD.  I, Dreux Mcgroarty C. Mike Gip, MD, have reviewed the above documentation for accuracy and completeness, and I agree with the above.

## 2020-05-02 ENCOUNTER — Other Ambulatory Visit: Payer: Medicare HMO

## 2020-05-02 ENCOUNTER — Ambulatory Visit: Payer: Medicare HMO

## 2020-05-02 ENCOUNTER — Ambulatory Visit: Payer: Medicare HMO | Admitting: Hematology and Oncology

## 2020-05-02 LAB — PROTEIN ELECTROPHORESIS, SERUM
A/G Ratio: 1.4 (ref 0.7–1.7)
Albumin ELP: 3.7 g/dL (ref 2.9–4.4)
Alpha-1-Globulin: 0.2 g/dL (ref 0.0–0.4)
Alpha-2-Globulin: 0.7 g/dL (ref 0.4–1.0)
Beta Globulin: 0.9 g/dL (ref 0.7–1.3)
Gamma Globulin: 0.9 g/dL (ref 0.4–1.8)
Globulin, Total: 2.6 g/dL (ref 2.2–3.9)
M-Spike, %: 0.5 g/dL — ABNORMAL HIGH
Total Protein ELP: 6.3 g/dL (ref 6.0–8.5)

## 2020-05-02 LAB — KAPPA/LAMBDA LIGHT CHAINS
Kappa free light chain: 154.7 mg/L — ABNORMAL HIGH (ref 3.3–19.4)
Kappa, lambda light chain ratio: 70.32 — ABNORMAL HIGH (ref 0.26–1.65)
Lambda free light chains: 2.2 mg/L — ABNORMAL LOW (ref 5.7–26.3)

## 2020-05-06 ENCOUNTER — Ambulatory Visit: Payer: Medicare HMO

## 2020-05-06 ENCOUNTER — Ambulatory Visit: Payer: Medicare HMO | Admitting: Hematology and Oncology

## 2020-05-06 ENCOUNTER — Other Ambulatory Visit: Payer: Medicare HMO

## 2020-05-13 ENCOUNTER — Other Ambulatory Visit: Payer: Self-pay

## 2020-05-13 DIAGNOSIS — C9000 Multiple myeloma not having achieved remission: Secondary | ICD-10-CM

## 2020-05-14 NOTE — Progress Notes (Signed)
[facility]  [address], [address] [place], [place] [address] Phone: [phone]  Fax: [phone]   Clinic Day:  05/15/2020  Referring physician: Denton Lank, MD  Chief Complaint: Laurie [name] is a 75 y.o. female with multiple myeloma who is seen for assessment prior to day 15 of cycle #6 daratumumab, Revlimid and Decadron.  HPI: The patient was last seen in the hematology clinic on 05/01/2020. At that time, she felt "good".   Back pain had resolved.  Hematocrit was 43.1, hemoglobin 14.4, platelets 352,000, WBC 5,900. Creatinine was 1.09 (CrCl 54 ml/min). M-spike was 0.5 gm/dL. Kappa free light chains were 154.7, lambda free light chains 2.2, ratio 70.32. She received day 1 of cycle #6 daratumumab, Revlimid and Decadron.  During the interim, she has been "ok." She has sweats sometimes. She had lower back pain for a couple of days but is it not hurting today. She denies any recent falls. She denies fevers, chills, lumps, bumps, bruising, bleeding, chest pain, shortness of breath, cough, nausea, vomiting, diarrhea, and urinary symptoms.  She is taking Revlimid 5 mg. She takes calcium 1,800 mg/day. She takes Singulair everyday.   Past Medical History:  Diagnosis Date  . Anxiety   . Asthma   . Depression   . Head injury   . Osteoporosis     Past Surgical History:  Procedure Laterality Date  . ABDOMINAL HYSTERECTOMY    . HEMORROIDECTOMY    . SEPTOPLASTY      Family History  Problem Relation Age of Onset  . Breast cancer Cousin        mat cousin    Social History:  reports that she has never smoked. She has never used smokeless tobacco. She reports that she does not drink alcohol and does not use drugs. She denies any exposure to radiation or toxins. She denies alcohol and tobacco use. Her husband has schizophrenia and committed suicide. The patient is retired and has not worked since 61. 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  She then worked in the [employer] x 5 years until disability at age 51.  She lives in [place], [place]. She has a Neurosurgeon named Phoebe.  She is a Sales promotion account executive [employer], and will not accept blood products.  The patient is alone today.  Allergies:  Allergies  Allergen Reactions  . Levofloxacin     Critical   . Other Other (See Comments)    Antihistamines    Current Medications: Current Outpatient Medications  Medication Sig Dispense Refill  . acyclovir (ZOVIRAX) 400 MG tablet Take 1 tablet (400 mg total) by mouth 2 (two) times daily. 60 tablet 11  . alendronate (FOSAMAX) 70 MG tablet     . aspirin 81 MG chewable tablet Chew by mouth daily.    Marland Kitchen atorvastatin (LIPITOR) 20 MG tablet Take by mouth.    . busPIRone (BUSPAR) 10 MG tablet Take 10 mg by mouth 2 (two) times daily.    . Calcium Carb-Cholecalciferol (CALCIUM 600 + D PO) Take 1,200 mg by mouth in the morning and at bedtime.     Marland Kitchen dexamethasone (DECADRON) 4 MG tablet 5 TAB BY MOUTH ONCE A WEEK. 5 TAB WEEKLY THE DAY AFTER DARATUMUMAB X 8 WEEKS. TAKE WITH BREAKFAST. 40 tablet 0  . docusate sodium (COLACE) 100 MG capsule Take 100 mg by mouth daily.    Marland Kitchen lenalidomide (REVLIMID) 5 MG capsule Take 1 tab po  daily for 21 days and 7 days off [employer] Auth # [id number] Date Obtained 04/19/2020 21 capsule 0  . montelukast (SINGULAIR) 10 MG tablet Take 10 mg by mouth at bedtime.    . naproxen (NAPROSYN) 500 MG tablet Take 500 mg by mouth 2 (two) times daily with a meal.    . nortriptyline (PAMELOR) 25 MG capsule Take by mouth.    Marland Kitchen omeprazole (PRILOSEC) 20 MG capsule Take 20 mg by mouth daily.    . ondansetron (ZOFRAN) 8 MG tablet Take 1 tablet (8 mg total) by mouth every 8 (eight) hours as needed (Nausea or vomiting). 30 tablet 1  . sertraline (ZOLOFT) 100 MG tablet Take 200 mg by mouth daily.     . traZODone (DESYREL) 100 MG tablet Take 25 mg by mouth at bedtime.     . Vitamin D, Ergocalciferol, (DRISDOL) 50000 UNITS  CAPS capsule Take 50,000 Units by mouth every 7 (seven) days.    Marland Kitchen albuterol (VENTOLIN HFA) 108 (90 Base) MCG/ACT inhaler 2 inhalations every 4 (four) hours as needed. (Patient not taking: No sig reported)    . fluticasone-salmeterol (ADVAIR HFA) 230-21 MCG/ACT inhaler Inhale into the lungs. (Patient not taking: No sig reported)     No current facility-administered medications for this visit.    Review of Systems  Constitutional: Positive for diaphoresis (occasional sweats). Negative for chills, fever, malaise/fatigue and weight loss (up 3 lbs).       Feels "ok."  HENT: Negative for congestion, ear discharge, ear pain, hearing loss, nosebleeds, sinus pain, sore throat and tinnitus.   Eyes: Negative for blurred vision and double vision.  Respiratory: Positive for cough (mornings and night, improved). Negative for hemoptysis, sputum production and shortness of breath.        Asthma  Cardiovascular: Negative for chest pain, palpitations and leg swelling.  Gastrointestinal: Negative for abdominal pain, blood in stool, constipation, diarrhea, heartburn (on medication), melena, nausea and vomiting.  Genitourinary: Negative for dysuria, frequency, hematuria and urgency.  Musculoskeletal: Positive for back pain (lower). Negative for joint pain, myalgias and neck pain.  Skin: Negative for itching and rash.  Neurological: Negative for dizziness, tingling, sensory change, weakness and headaches.       Occasional balance problems. Traumatic brain injury (fall) 3 years ago.  Endo/Heme/Allergies: Does not bruise/bleed easily.  Psychiatric/Behavioral: Negative for depression and memory loss. The patient is not nervous/anxious and does not have insomnia.   All other systems reviewed and are negative.  Performance status (ECOG): 1  Vitals Blood pressure (!) 157/80, pulse (!) 106, temperature 98 F (36.7 C), temperature source Tympanic, resp. rate 18, weight 216 lb 0.8 oz (98 kg), SpO2 100 %.   Physical  Exam Vitals and nursing note reviewed.  Constitutional:      General: She is not in acute distress.    Appearance: Normal appearance. She is well-developed.     Interventions: Face mask in place.  HENT:     Head: Normocephalic and atraumatic.     Comments: Short gray hair.    Mouth/Throat:     Mouth: Mucous membranes are moist. No oral lesions.     Pharynx: Oropharynx is clear.  Eyes:     Extraocular Movements: Extraocular movements intact.     Conjunctiva/sclera: Conjunctivae normal.     Pupils: Pupils are equal, round, and reactive to light.     Comments: Glasses. Blue eyes.  Cardiovascular:     Rate and Rhythm: Normal rate and regular rhythm.  Heart sounds: Normal heart sounds. No murmur heard. No friction rub. No gallop.   Pulmonary:     Effort: Pulmonary effort is normal. No respiratory distress.  Chest:     Chest wall: No tenderness.  Breasts:     Right: No axillary adenopathy or supraclavicular adenopathy.     Left: No axillary adenopathy or supraclavicular adenopathy.    Abdominal:     General: Bowel sounds are normal. There is no distension.     Palpations: Abdomen is soft. There is no hepatomegaly, splenomegaly or mass.     Tenderness: There is no abdominal tenderness. There is no guarding or rebound.  Musculoskeletal:        General: No swelling or tenderness. Normal range of motion.     Cervical back: Normal range of motion and neck supple.  Lymphadenopathy:     Head:     Right side of head: No preauricular, posterior auricular or occipital adenopathy.     Left side of head: No preauricular, posterior auricular or occipital adenopathy.     Cervical: No cervical adenopathy.     Upper Body:     Right upper body: No supraclavicular or axillary adenopathy.     Left upper body: No supraclavicular or axillary adenopathy.     Lower Body: No right inguinal adenopathy. No left inguinal adenopathy.  Skin:    General: Skin is warm and dry.     Findings: No  bruising, erythema, lesion or rash.  Neurological:     Mental Status: She is alert and oriented to person, place, and time.  Psychiatric:        Behavior: Behavior normal.        Thought Content: Thought content normal.        Judgment: Judgment normal.    Appointment on 05/15/2020  Component Date Value Ref Range Status  . Magnesium 05/15/2020 2.1  1.7 - 2.4 mg/dL Final   Performed at [facility], [address] [place], [place] [address]  . Sodium 05/15/2020 137  135 - 145 mmol/L Final  . Potassium 05/15/2020 4.3  3.5 - 5.1 mmol/L Final  . Chloride 05/15/2020 100  98 - 111 mmol/L Final  . CO2 05/15/2020 33* 22 - 32 mmol/L Final  . Glucose, Bld 05/15/2020 99  70 - 99 mg/dL Final   Glucose reference range applies only to samples taken after fasting for at least 8 hours.  . BUN 05/15/2020 20  8 - 23 mg/dL Final  . Creatinine, Ser 05/15/2020 1.14* 0.44 - 1.00 mg/dL Final  . Calcium 05/15/2020 9.1  8.9 - 10.3 mg/dL Final  . Total Protein 05/15/2020 6.8  6.5 - 8.1 g/dL Final  . Albumin 05/15/2020 4.1  3.5 - 5.0 g/dL Final  . AST 05/15/2020 19  15 - 41 U/L Final  . ALT 05/15/2020 25  0 - 44 U/L Final  . Alkaline Phosphatase 05/15/2020 92  38 - 126 U/L Final  . Total Bilirubin 05/15/2020 0.7  0.3 - 1.2 mg/dL Final  . GFR, Estimated 05/15/2020 51* >60 mL/min Final   Comment: (NOTE) Calculated using the CKD-EPI Creatinine Equation (2021)   . Anion gap 05/15/2020 4* 5 - 15 Final   Performed at [facility], [address] [place], [place] [address]  . WBC 05/15/2020 7.4  4.0 - 10.5 K/uL Final  . RBC 05/15/2020 4.37  3.87 - 5.11 MIL/uL Final  . Hemoglobin 05/15/2020 13.7  12.0 - 15.0 g/dL Final  . HCT  05/15/2020 41.0  36.0 - 46.0 % Final  . MCV 05/15/2020 93.8  80.0 - 100.0 fL Final  . MCH 05/15/2020 31.4  26.0 - 34.0 pg Final  . MCHC 05/15/2020 33.4  30.0 - 36.0 g/dL Final  . RDW 05/15/2020 13.4  11.5 - 15.5 % Final  . Platelets 05/15/2020 275  150 - 400  K/uL Final  . nRBC 05/15/2020 0.0  0.0 - 0.2 % Final  . Neutrophils Relative % 05/15/2020 68  % Final  . Neutro Abs 05/15/2020 5.0  1.7 - 7.7 K/uL Final  . Lymphocytes Relative 05/15/2020 14  % Final  . Lymphs Abs 05/15/2020 1.0  0.7 - 4.0 K/uL Final  . Monocytes Relative 05/15/2020 7  % Final  . Monocytes Absolute 05/15/2020 0.5  0.1 - 1.0 K/uL Final  . Eosinophils Relative 05/15/2020 10  % Final  . Eosinophils Absolute 05/15/2020 0.7* 0.0 - 0.5 K/uL Final  . Basophils Relative 05/15/2020 1  % Final  . Basophils Absolute 05/15/2020 0.1  0.0 - 0.1 K/uL Final  . Immature Granulocytes 05/15/2020 0  % Final  . Abs Immature Granulocytes 05/15/2020 0.03  0.00 - 0.07 K/uL Final   Performed at [facility], [address], [place], [place] [address]    Assessment:  Laurie [name] is a 75 y.o. female with stage III biclonal IgA multiple myeloma.  SPEP was 5.0 gm/dL on 10/03/2019. Random urine revealed 196.6 mg/dL total protein with 58.3% M-spike.   Bone marrow biopsy on 11/13/2019 revealed a hypercellular bone marrow with extensive involvement by plasma cell neoplasm. Atypical plasma cells represented 70% of all cells in the aspirate and was associated with prominent interstitial infiltrates and diffuse sheets in the clot.  Plasma cells were kappa light chain restricted.   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     Labs on 10/03/2019 revealed a hematocrit 27.9, hemoglobin 9.4, MCV 108, platelets 287,000, WBC 3,800 (ANC 2400).   Creatinine was 0.94, albumin 3.4, and protein 11.2.  Ferritin was 82 with an iron saturation of 30% and a TIBC of 206.  Hepatitis C antibody and HIV testing were negative.    Work-up on  10/26/2019 revealed a hematocrit was 27.4, hemoglobin 9.1, MCV 109.6, platelets 250,000, WBC 3,400 (ANC 2,100).  Retic was 2.2%.  Creatinine was 1.06 and calcium 8.6. Total protein was 11.4 and albumin 3.2.  M-spike was 5.7 gm/dL biclonal IgA protein with kappa specificity.  Kappa free light chain were 1,376.3, lambda free light chains <1.5, and ratio > 917.53 (0.26-1.65).  Beta 2 microglobulin was 10.3 (0.6 - 2.4). LDH was 102. TSH was 3.673.  Hepatitis serologies were negative on 11/28/2019.  24 hour UPEP on 10/30/2019 revealed 1020 mg/24 hours protein with 65.3% M-spike (665.9 mg/24 hours).  Urine free kappa light chain were 2,620.44, free lambda light chain 6.01 and ratio 436.01 (1.03-31.76).  Bone survey on 10/26/2019 revealed small lytic lesions in the skull. There was no acute fracture.  SPEP has been followed (gm/dL): 5.7 on 10/26/2019, 3.5 on 12/27/2019, 2.3 on 01/24/2020, 1.0 on 02/28/2020, 0.7 on 03/27/2020, and 0.5 on 05/01/2020. Kappa free light chains have been followed (mg/L): 1376.3 on 10/26/2019, 768 on 01/03/2020, 578.7 on 01/24/2020, 440.6 (ratio 220.30), 306.4 (ratio 127.67) on  03/27/2020, and 154.7 (ratio 70.32) on 05/01/2020.  She is day 15 of cycle # 6 daratumumab SQ and Decadron (11/29/2019 - 05/01/2020).  She began monthly Xgeva on 12/05/2019 (last 04/24/2020).  She has B12 deficiency. Vitamin B12 was 200 (low) and folate >20.0 on 10/03/2019.  She was on weekly B12 injections and is now on monthly B12 injections (last 04/17/2020).  Antiparietal antibody and intrinsic factor antibodies were negative on 12/05/2019.  Chest CT without contrast on 03/27/2020 revealed numerous healed bilateral rib fractures. CXR abnormality was due to a healed rib fracture with callus formation.  She is a Restaurant manager, fast food.  She receives Retacrit (last 12/13/2019) and IV iron support.  Ferritin was 66 on 02/28/2020.  She received Venofer on 12/05/2019, 12/13/2019, and 01/17/2020.  She  received both doses of the COVID-19 vaccine in 08/2019.  Symptomatically, she feels "ok." She has sweats sometimes. She has intermittent lower back pain.  Exam is stable.  Plan: 1.   Labs today: CBC with diff, CMP, Mg. 2.   Stage III multiple myeloma Patient was diagnosed with a 5.7 gm/dL biclonal IgA gammopathy with kappa specificity.                         M-spike was 0.5 on 05/01/2020. Kappa free light chains were 1,376.3 with a ratio >917.53 (0.26-1.65) on 10/26/2019.                          Kappa free light chains were 154.7 on 05/01/2020. Bone survey reveals lytic lesions in skull. She receives [address] (cycle length 28 days). Daratumumab SQ 1800 mg on days 1 and 15 for cycles #3 - #6 Revlimid 5 mg po day 1-21. Decadron 20 mg po days 1-2, and 15-16. Prophylaxis: Singulair 10 mg po the day before, the day of and 2 days after daratumumab. Acyclovir 400 mg po BID. Aspirin 81 mg a day. Supportive care: She receives Xgeva monthly (last 04/24/2020).             Symptomatically, she continues to do well.  Labs reviewed.  Day 15 of cycle #6 daratumumab today.                          Continue Revlimid.    Discuss plan to increase Revlimid to 10 mg a day with next cycle.  Discuss symptom management.  She has antiemeticsat home to use on a prn bases.  Interventions are adequate.        3.   Lytic bone lesions  She has intermittent back pain unrelated to myeloma.  Continue calcium and vitamin D.    Continue monthly Xgeva (last 04/24/2020). 4.   B12 deficiency  B12 today and monthly x 6(last 04/17/2020).  Folate was >20 on 10/03/2019.  Monitor folate annually 5.   Hypocalcemia  Calcium 8.3 (corrected 8.51).  Continue calcium 1800 mg  a day. 6.   Day 15 of cycle #6 daratumumab today. 7.   B12 today. 8.   RN:  Revlimid dose will increase to 10 mg day 1-21 next cycle. 9.   RTC in 1 week for labs (BMP) and Xgeva. 10.   RTC in 2 weeks for MD assessment, labs (CBC with diff, CMP, Mg, SPEP, FLCA), and day 1 of cycle #7 daratumumab.  I discussed the assessment and treatment plan with the patient.  The patient was provided an opportunity to ask  questions and all were answered.  The patient agreed with the plan and demonstrated an understanding of the instructions.  The patient was advised to call back if the symptoms worsen or if the condition fails to improve as anticipated.  I provided 25 minutes of face-to-face time during this this encounter and > 50% was spent counseling as documented under my assessment and plan.   Lequita Asal, MD, PhD    05/15/2020, 1:40 PM  I, Mirian Mo Tufford, am acting as Education administrator for [employer]. Mike Gip, MD, PhD.  I, Melissa C. Mike Gip, MD, have reviewed the above documentation for accuracy and completeness, and I agree with the above.

## 2020-05-15 ENCOUNTER — Ambulatory Visit: Payer: Medicare HMO

## 2020-05-15 ENCOUNTER — Ambulatory Visit: Payer: Medicare HMO | Admitting: Hematology and Oncology

## 2020-05-15 ENCOUNTER — Inpatient Hospital Stay: Payer: Medicare HMO

## 2020-05-15 ENCOUNTER — Other Ambulatory Visit: Payer: Self-pay

## 2020-05-15 ENCOUNTER — Other Ambulatory Visit: Payer: Medicare HMO

## 2020-05-15 ENCOUNTER — Inpatient Hospital Stay: Payer: Medicare HMO | Attending: Hematology and Oncology

## 2020-05-15 ENCOUNTER — Inpatient Hospital Stay (HOSPITAL_BASED_OUTPATIENT_CLINIC_OR_DEPARTMENT_OTHER): Payer: Medicare HMO | Admitting: Hematology and Oncology

## 2020-05-15 ENCOUNTER — Encounter: Payer: Self-pay | Admitting: Hematology and Oncology

## 2020-05-15 VITALS — BP 157/80 | HR 106 | Temp 98.0°F | Resp 18 | Wt 216.1 lb

## 2020-05-15 VITALS — BP 138/80 | HR 91

## 2020-05-15 DIAGNOSIS — C9000 Multiple myeloma not having achieved remission: Secondary | ICD-10-CM

## 2020-05-15 DIAGNOSIS — Z5112 Encounter for antineoplastic immunotherapy: Secondary | ICD-10-CM

## 2020-05-15 DIAGNOSIS — E538 Deficiency of other specified B group vitamins: Secondary | ICD-10-CM | POA: Insufficient documentation

## 2020-05-15 DIAGNOSIS — D649 Anemia, unspecified: Secondary | ICD-10-CM

## 2020-05-15 DIAGNOSIS — M899 Disorder of bone, unspecified: Secondary | ICD-10-CM

## 2020-05-15 LAB — CBC WITH DIFFERENTIAL/PLATELET
Abs Immature Granulocytes: 0.03 10*3/uL (ref 0.00–0.07)
Basophils Absolute: 0.1 10*3/uL (ref 0.0–0.1)
Basophils Relative: 1 %
Eosinophils Absolute: 0.7 10*3/uL — ABNORMAL HIGH (ref 0.0–0.5)
Eosinophils Relative: 10 %
HCT: 41 % (ref 36.0–46.0)
Hemoglobin: 13.7 g/dL (ref 12.0–15.0)
Immature Granulocytes: 0 %
Lymphocytes Relative: 14 %
Lymphs Abs: 1 10*3/uL (ref 0.7–4.0)
MCH: 31.4 pg (ref 26.0–34.0)
MCHC: 33.4 g/dL (ref 30.0–36.0)
MCV: 93.8 fL (ref 80.0–100.0)
Monocytes Absolute: 0.5 10*3/uL (ref 0.1–1.0)
Monocytes Relative: 7 %
Neutro Abs: 5 10*3/uL (ref 1.7–7.7)
Neutrophils Relative %: 68 %
Platelets: 275 10*3/uL (ref 150–400)
RBC: 4.37 MIL/uL (ref 3.87–5.11)
RDW: 13.4 % (ref 11.5–15.5)
WBC: 7.4 10*3/uL (ref 4.0–10.5)
nRBC: 0 % (ref 0.0–0.2)

## 2020-05-15 LAB — COMPREHENSIVE METABOLIC PANEL
ALT: 25 U/L (ref 0–44)
AST: 19 U/L (ref 15–41)
Albumin: 4.1 g/dL (ref 3.5–5.0)
Alkaline Phosphatase: 92 U/L (ref 38–126)
Anion gap: 4 — ABNORMAL LOW (ref 5–15)
BUN: 20 mg/dL (ref 8–23)
CO2: 33 mmol/L — ABNORMAL HIGH (ref 22–32)
Calcium: 9.1 mg/dL (ref 8.9–10.3)
Chloride: 100 mmol/L (ref 98–111)
Creatinine, Ser: 1.14 mg/dL — ABNORMAL HIGH (ref 0.44–1.00)
GFR, Estimated: 51 mL/min — ABNORMAL LOW (ref >60)
Glucose, Bld: 99 mg/dL (ref 70–99)
Potassium: 4.3 mmol/L (ref 3.5–5.1)
Sodium: 137 mmol/L (ref 135–145)
Total Bilirubin: 0.7 mg/dL (ref 0.3–1.2)
Total Protein: 6.8 g/dL (ref 6.5–8.1)

## 2020-05-15 LAB — MAGNESIUM: Magnesium: 2.1 mg/dL (ref 1.7–2.4)

## 2020-05-15 MED ORDER — CYANOCOBALAMIN 1000 MCG/ML IJ SOLN
1000.0000 ug | Freq: Once | INTRAMUSCULAR | Status: AC
  Administered 2020-05-15: 1000 ug via INTRAMUSCULAR
  Filled 2020-05-15: qty 1

## 2020-05-15 MED ORDER — ONDANSETRON HCL 4 MG PO TABS
8.0000 mg | ORAL_TABLET | Freq: Once | ORAL | Status: AC
  Administered 2020-05-15: 8 mg via ORAL
  Filled 2020-05-15: qty 2

## 2020-05-15 MED ORDER — ACETAMINOPHEN 325 MG PO TABS
650.0000 mg | ORAL_TABLET | Freq: Once | ORAL | Status: AC
  Administered 2020-05-15: 650 mg via ORAL
  Filled 2020-05-15: qty 2

## 2020-05-15 MED ORDER — DIPHENHYDRAMINE HCL 25 MG PO CAPS
50.0000 mg | ORAL_CAPSULE | Freq: Once | ORAL | Status: AC
  Administered 2020-05-15: 50 mg via ORAL
  Filled 2020-05-15: qty 2

## 2020-05-15 MED ORDER — DEXAMETHASONE 4 MG PO TABS
20.0000 mg | ORAL_TABLET | Freq: Once | ORAL | Status: AC
  Administered 2020-05-15: 20 mg via ORAL
  Filled 2020-05-15: qty 5

## 2020-05-15 MED ORDER — LENALIDOMIDE 10 MG PO CAPS: 10.0000 mg | ORAL_CAPSULE | Freq: Every day | ORAL | 0 refills | Status: DC

## 2020-05-15 MED ORDER — DARATUMUMAB-HYALURONIDASE-FIHJ 1800-30000 MG-UT/15ML ~~LOC~~ SOLN
1800.0000 mg | Freq: Once | SUBCUTANEOUS | Status: AC
  Administered 2020-05-15: 1800 mg via SUBCUTANEOUS
  Filled 2020-05-15: qty 15

## 2020-05-15 NOTE — Progress Notes (Signed)
Patient report back pain "across panty line, right above hips" that comes and goes. Caused her to not sleep last night.

## 2020-05-15 NOTE — Progress Notes (Signed)
Patient received prescribed treatment in clinic. Tolerated well. Patient stable at discharge. 

## 2020-05-17 ENCOUNTER — Telehealth: Payer: Self-pay | Admitting: *Deleted

## 2020-05-17 MED ORDER — LENALIDOMIDE 10 MG PO CAPS: 10.0000 mg | ORAL_CAPSULE | Freq: Every day | ORAL | 0 refills | Status: DC

## 2020-05-17 NOTE — Telephone Encounter (Signed)
Needs length of cycle on prescription. Prescription resubmitted

## 2020-05-20 ENCOUNTER — Inpatient Hospital Stay: Payer: Medicare HMO

## 2020-05-22 ENCOUNTER — Inpatient Hospital Stay: Payer: Medicare HMO

## 2020-05-22 ENCOUNTER — Other Ambulatory Visit: Payer: Self-pay | Admitting: Hematology and Oncology

## 2020-05-22 ENCOUNTER — Telehealth: Payer: Self-pay

## 2020-05-22 ENCOUNTER — Other Ambulatory Visit: Payer: Self-pay

## 2020-05-22 DIAGNOSIS — C9000 Multiple myeloma not having achieved remission: Secondary | ICD-10-CM

## 2020-05-22 DIAGNOSIS — Z5112 Encounter for antineoplastic immunotherapy: Secondary | ICD-10-CM | POA: Diagnosis not present

## 2020-05-22 LAB — BASIC METABOLIC PANEL
Anion gap: 9 (ref 5–15)
BUN: 18 mg/dL (ref 8–23)
CO2: 24 mmol/L (ref 22–32)
Calcium: 8.4 mg/dL — ABNORMAL LOW (ref 8.9–10.3)
Chloride: 103 mmol/L (ref 98–111)
Creatinine, Ser: 1.1 mg/dL — ABNORMAL HIGH (ref 0.44–1.00)
GFR, Estimated: 53 mL/min — ABNORMAL LOW (ref >60)
Glucose, Bld: 125 mg/dL — ABNORMAL HIGH (ref 70–99)
Potassium: 4 mmol/L (ref 3.5–5.1)
Sodium: 136 mmol/L (ref 135–145)

## 2020-05-22 NOTE — Telephone Encounter (Signed)
Received fax that patient has been approved for darzalex faspro solution 1800/30000/MG-UT/15ML until 11/18/20.

## 2020-05-22 NOTE — Progress Notes (Signed)
No Xgeva given today per Dr. Mike Gip; calcium low at 8.4. Patient to RTC next week for repeat labs and possible Xgeva injection.

## 2020-05-27 NOTE — Progress Notes (Signed)
[facility]  [address], [address] [place], [place] [address] Phone: [phone]  Fax: [phone]   Clinic Day:  05/29/2020  Referring physician: Denton Lank, MD  Chief Complaint: Laurie [name] is a 75 y.o. female with multiple myeloma who is seen for assessment prior to day 1 of cycle #7 daratumumab, Revlimid and Decadron.  HPI: The patient was last seen in the hematology clinic on 05/16/2019. At that time, she felt "ok".   She noted transient low back pain.  Hematocrit was 41.0, hemoglobin 13.7, platelets 275,000, WBC 7,400 with an ANC of 5000. Creatinine was 1.14 (CrCl 51 ml/min). She received day 15 of cycle #6 daratumumab, Revlimid and Decadron. She received a vitamin B12 injection.  BMP on 05/22/2020 revealed a creatinine of 1.10 (CrCl 53 ml/min). Calcium was 8.4.  Delton See was held.  During the interim, she has been "ok." She has felt bad over the past 3 evenings. She has been very tired and has had mild nausea. She has needed to take her nausea medication once. She is sleeping well. She denies vomiting and diarrhea. She denies any other new symptoms or complaints.  The patient takes calcium 1,800 mg daily. She has Revlimid 10 mg at home and will be starting it today. She takes Singulair everyday.   Past Medical History:  Diagnosis Date  . Anxiety   . Asthma   . Depression   . Head injury   . Osteoporosis     Past Surgical History:  Procedure Laterality Date  . ABDOMINAL HYSTERECTOMY    . HEMORROIDECTOMY    . SEPTOPLASTY      Family History  Problem Relation Age of Onset  . Breast cancer Cousin        mat cousin    Social History:  reports that she has never smoked. She has never used smokeless tobacco. She reports that she does not drink alcohol and does not use drugs. She denies any exposure to radiation or toxins. She denies alcohol and tobacco use. Her husband has schizophrenia and committed suicide. The patient is retired and has  not worked since 48. 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  She then worked in the [employer] x 5 years until disability at age 58.  She lives in [place], [place]. She has a Neurosurgeon named Laurie [name].  She is a Sales promotion account executive Witness, and will not accept blood products.  The patient is alone today.  Allergies:  Allergies  Allergen Reactions  . Levofloxacin     Critical   . Other Other (See Comments)    Antihistamines    Current Medications: Current Outpatient Medications  Medication Sig Dispense Refill  . acyclovir (ZOVIRAX) 400 MG tablet Take 1 tablet (400 mg total) by mouth 2 (two) times daily. 60 tablet 11  . alendronate (FOSAMAX) 70 MG tablet     . aspirin 81 MG chewable tablet Chew by mouth daily.    Marland Kitchen atorvastatin (LIPITOR) 20 MG tablet Take by mouth.    . busPIRone (BUSPAR) 10 MG tablet Take 10 mg by mouth 2 (two) times daily.    . Calcium Carb-Cholecalciferol (CALCIUM 600 + D PO) Take 1,200 mg by mouth in the morning and at bedtime.     Marland Kitchen dexamethasone (DECADRON) 4 MG tablet 5 TAB BY MOUTH ONCE A WEEK. 5 TAB WEEKLY THE DAY AFTER DARATUMUMAB X 8 WEEKS. TAKE WITH BREAKFAST. 40 tablet 0  . docusate  sodium (COLACE) 100 MG capsule Take 100 mg by mouth daily.    Marland Kitchen lenalidomide (REVLIMID) 10 MG capsule Take 1 capsule (10 mg total) by mouth daily. Three weeks on, 1 week off. Fanny Dance #[id number]    Date Obtained 05/15/20 21 capsule 0  . montelukast (SINGULAIR) 10 MG tablet Take 10 mg by mouth at bedtime.    . naproxen (NAPROSYN) 500 MG tablet Take 500 mg by mouth 2 (two) times daily with a meal.    . nortriptyline (PAMELOR) 25 MG capsule Take by mouth.    Marland Kitchen omeprazole (PRILOSEC) 20 MG capsule Take 20 mg by mouth daily.    . ondansetron (ZOFRAN) 8 MG tablet Take 1 tablet (8 mg total) by mouth every 8 (eight) hours as needed (Nausea or vomiting). 30 tablet 1  . sertraline (ZOLOFT) 100 MG tablet Take 200 mg by mouth daily.     . traZODone  (DESYREL) 100 MG tablet Take 25 mg by mouth at bedtime.     . Vitamin D, Ergocalciferol, (DRISDOL) 50000 UNITS CAPS capsule Take 50,000 Units by mouth every 7 (seven) days.    Marland Kitchen albuterol (VENTOLIN HFA) 108 (90 Base) MCG/ACT inhaler 2 inhalations every 4 (four) hours as needed. (Patient not taking: No sig reported)    . fluticasone-salmeterol (ADVAIR HFA) 230-21 MCG/ACT inhaler Inhale into the lungs. (Patient not taking: No sig reported)     No current facility-administered medications for this visit.   Review of Systems  Constitutional: Positive for diaphoresis (occasional sweats), malaise/fatigue (past 3 nights) and weight loss (3 lbs). Negative for chills and fever.       Feels "okay."  HENT: Negative for congestion, ear discharge, ear pain, hearing loss, nosebleeds, sinus pain, sore throat and tinnitus.   Eyes: Negative for blurred vision and double vision.  Respiratory: Positive for cough (mornings and night, improved). Negative for hemoptysis, sputum production and shortness of breath.        Asthma  Cardiovascular: Negative for chest pain, palpitations and leg swelling.  Gastrointestinal: Positive for nausea (past 3 nights). Negative for abdominal pain, blood in stool, constipation, diarrhea, heartburn (on medication), melena and vomiting.  Genitourinary: Negative for dysuria, frequency, hematuria and urgency.  Musculoskeletal: Positive for back pain (lower). Negative for joint pain, myalgias and neck pain.  Skin: Negative for itching and rash.  Neurological: Negative for dizziness, tingling, sensory change, weakness and headaches.       Occasional balance problems. Traumatic brain injury (fall) 3 years ago.  Endo/Heme/Allergies: Does not bruise/bleed easily.  Psychiatric/Behavioral: Negative for depression and memory loss. The patient is not nervous/anxious and does not have insomnia.   All other systems reviewed and are negative.  Performance status (ECOG): 1  Vitals Blood  pressure (!) 155/80, pulse 99, temperature (!) 97.3 F (36.3 C), temperature source Tympanic, resp. rate 18, weight 213 lb 13.5 oz (97 kg), SpO2 97 %.   Physical Exam Vitals and nursing note reviewed.  Constitutional:      General: She is not in acute distress.    Appearance: Normal appearance. She is well-developed.     Interventions: Face mask in place.  HENT:     Head: Normocephalic and atraumatic.     Comments: Short gray hair.    Mouth/Throat:     Mouth: Mucous membranes are moist. No oral lesions.     Pharynx: Oropharynx is clear.  Eyes:     Extraocular Movements: Extraocular movements intact.     Conjunctiva/sclera: Conjunctivae normal.  Pupils: Pupils are equal, round, and reactive to light.     Comments: Glasses. Blue eyes.  Cardiovascular:     Rate and Rhythm: Normal rate and regular rhythm.     Heart sounds: Normal heart sounds. No murmur heard. No friction rub. No gallop.   Pulmonary:     Effort: Pulmonary effort is normal. No respiratory distress.  Chest:     Chest wall: No tenderness.  Breasts:     Right: No axillary adenopathy or supraclavicular adenopathy.     Left: No axillary adenopathy or supraclavicular adenopathy.    Abdominal:     General: Bowel sounds are normal. There is no distension.     Palpations: Abdomen is soft. There is no hepatomegaly, splenomegaly or mass.     Tenderness: There is no abdominal tenderness. There is no guarding or rebound.  Musculoskeletal:        General: No swelling or tenderness. Normal range of motion.     Cervical back: Normal range of motion and neck supple.  Lymphadenopathy:     Head:     Right side of head: No preauricular, posterior auricular or occipital adenopathy.     Left side of head: No preauricular, posterior auricular or occipital adenopathy.     Cervical: No cervical adenopathy.     Upper Body:     Right upper body: No supraclavicular or axillary adenopathy.     Left upper body: No supraclavicular or  axillary adenopathy.     Lower Body: No right inguinal adenopathy. No left inguinal adenopathy.  Skin:    General: Skin is warm and dry.     Findings: No bruising, erythema, lesion or rash.  Neurological:     Mental Status: She is alert and oriented to person, place, and time.  Psychiatric:        Behavior: Behavior normal.        Thought Content: Thought content normal.        Judgment: Judgment normal.    Appointment on 05/29/2020  Component Date Value Ref Range Status  . Sodium 05/29/2020 136  135 - 145 mmol/L Final  . Potassium 05/29/2020 4.2  3.5 - 5.1 mmol/L Final  . Chloride 05/29/2020 103  98 - 111 mmol/L Final  . CO2 05/29/2020 23  22 - 32 mmol/L Final  . Glucose, Bld 05/29/2020 102* 70 - 99 mg/dL Final   Glucose reference range applies only to samples taken after fasting for at least 8 hours.  . BUN 05/29/2020 15  8 - 23 mg/dL Final  . Creatinine, Ser 05/29/2020 1.04* 0.44 - 1.00 mg/dL Final  . Calcium 05/29/2020 8.3* 8.9 - 10.3 mg/dL Final  . Total Protein 05/29/2020 6.5  6.5 - 8.1 g/dL Final  . Albumin 05/29/2020 3.7  3.5 - 5.0 g/dL Final  . AST 05/29/2020 16  15 - 41 U/L Final  . ALT 05/29/2020 16  0 - 44 U/L Final  . Alkaline Phosphatase 05/29/2020 81  38 - 126 U/L Final  . Total Bilirubin 05/29/2020 0.5  0.3 - 1.2 mg/dL Final  . GFR, Estimated 05/29/2020 57* >60 mL/min Final   Comment: (NOTE) Calculated using the CKD-EPI Creatinine Equation (2021)   . Anion gap 05/29/2020 10  5 - 15 Final   Performed at [facility], [address], [place], [place] [address]  . Magnesium 05/29/2020 2.1  1.7 - 2.4 mg/dL Final   Performed at [facility], [address], [place],  [address]  .  WBC 05/29/2020 5.4  4.0 - 10.5 K/uL Final  . RBC 05/29/2020 4.40  3.87 - 5.11 MIL/uL Final  . Hemoglobin 05/29/2020 13.6  12.0 - 15.0 g/dL Final  . HCT 05/29/2020 40.8  36.0 - 46.0 % Final  . MCV 05/29/2020 92.7  80.0 - 100.0 fL Final  . MCH 05/29/2020  30.9  26.0 - 34.0 pg Final  . MCHC 05/29/2020 33.3  30.0 - 36.0 g/dL Final  . RDW 05/29/2020 13.6  11.5 - 15.5 % Final  . Platelets 05/29/2020 299  150 - 400 K/uL Final  . nRBC 05/29/2020 0.0  0.0 - 0.2 % Final  . Neutrophils Relative % 05/29/2020 62  % Final  . Neutro Abs 05/29/2020 3.3  1.7 - 7.7 K/uL Final  . Lymphocytes Relative 05/29/2020 18  % Final  . Lymphs Abs 05/29/2020 0.9  0.7 - 4.0 K/uL Final  . Monocytes Relative 05/29/2020 9  % Final  . Monocytes Absolute 05/29/2020 0.5  0.1 - 1.0 K/uL Final  . Eosinophils Relative 05/29/2020 9  % Final  . Eosinophils Absolute 05/29/2020 0.5  0.0 - 0.5 K/uL Final  . Basophils Relative 05/29/2020 2  % Final  . Basophils Absolute 05/29/2020 0.1  0.0 - 0.1 K/uL Final  . Immature Granulocytes 05/29/2020 0  % Final  . Abs Immature Granulocytes 05/29/2020 0.02  0.00 - 0.07 K/uL Final   Performed at [facility], [address], [place],  [address]    Assessment:  ALLIE OUSLEY is a 75 y.o. female with stage III biclonal IgA multiple myeloma.  SPEP was 5.0 gm/dL on 10/03/2019. Random urine revealed 196.6 mg/dL total protein with 58.3% M-spike.   Bone marrow biopsy on 11/13/2019 revealed a hypercellular bone marrow with extensive involvement by plasma cell neoplasm. Atypical plasma cells represented 70% of all cells in the aspirate and was associated with prominent interstitial infiltrates and diffuse sheets in the clot.  Plasma cells were kappa light chain restricted.   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     Labs on 10/03/2019 revealed a hematocrit 27.9, hemoglobin 9.4, MCV 108, platelets 287,000, WBC 3,800 (ANC 2400).   Creatinine was 0.94, albumin  3.4, and protein 11.2.  Ferritin was 82 with an iron saturation of 30% and a TIBC of 206.  Hepatitis C antibody and HIV testing were negative.    Work-up on 10/26/2019 revealed a hematocrit was 27.4, hemoglobin 9.1, MCV 109.6, platelets 250,000, WBC 3,400 (ANC 2,100).  Retic was 2.2%.  Creatinine was 1.06 and calcium 8.6. Total protein was 11.4 and albumin 3.2.  M-spike was 5.7 gm/dL biclonal IgA protein with kappa specificity.  Kappa free light chain were 1,376.3, lambda free light chains <1.5, and ratio > 917.53 (0.26-1.65).  Beta 2 microglobulin was 10.3 (0.6 - 2.4). LDH was 102. TSH was 3.673.  Hepatitis serologies were negative on 11/28/2019.  24 hour UPEP on 10/30/2019 revealed 1020 mg/24 hours protein with 65.3% M-spike (665.9 mg/24 hours).  Urine free kappa light chain were 2,620.44, free lambda light chain 6.01 and ratio 436.01 (1.03-31.76).  Bone survey on 10/26/2019 revealed small lytic lesions in the skull. There was no acute fracture.  SPEP has been followed (gm/dL): 5.7 on 10/26/2019, 3.5 on 12/27/2019, 2.3 on  01/24/2020, 1.0 on 02/28/2020, 0.7 on 03/27/2020, 0.5 on 05/01/2020, and 0.2 on 05/29/2020. Kappa free light chains have been followed (mg/L): 1376.3 on 10/26/2019, 768 on 01/03/2020, 578.7 on 01/24/2020, 440.6 (ratio 220.30), 306.4 (ratio 127.67) on 03/27/2020, 154.7 (ratio 70.32) on 05/01/2020, and 91.8 (ratio 36.72) on 05/29/2020.  She is day 1 of cycle # 7 daratumumab SQ and Decadron (11/29/2019 - 05/29/2020).  She began monthly Xgeva on 12/05/2019 (last 04/24/2020).  She has B12 deficiency. Vitamin B12 was 200 (low) and folate >20.0 on 10/03/2019.  She was on weekly B12 injections and is now on monthly B12 injections (last 05/15/2020).  Antiparietal antibody and intrinsic factor antibodies were negative on 12/05/2019.  Chest CT without contrast on 03/27/2020 revealed numerous healed bilateral rib fractures. CXR abnormality was due to a healed rib fracture with callus  formation.  She is a Scientist, product/process development.  She receives Retacrit (last 12/13/2019) and IV iron support.  Ferritin was 66 on 02/28/2020.  She received Venofer on 12/05/2019, 12/13/2019, and 01/17/2020.  She received both doses of the COVID-19 vaccine in 08/2019.  Symptomatically,  she feels "ok."  She has been very tired and has had mild nausea. She is sleeping well. She denies vomiting and diarrhea. She denies any other new symptoms.  Exam is stable.  Plan: 1.   Labs today: CBC with diff, CMP, Mg, SPEP, FLCA. 2.   Stage III multiple myeloma Patient was diagnosed with a 5.7 gm/dL biclonal IgA gammopathy with kappa specificity.                         M-spike was 0.2 on 05/29/2020. Kappa free light chains were 1,376.3 with a ratio >917.53 (0.26-1.65) on 10/26/2019.                          Kappa free light chains were 91.8 on 05/29/2020. Bone survey reveals lytic lesions in skull. She receives [address] (cycle length 28 days). Daratumumab SQ 1800 mg on days 1 cycles #7 and beyond Revlimid 10 mg po day 1-21 (dose increased with cycle #7). Decadron 20 mg po days 1-2 (consider Decadron 20 mg weekly if tolerated). Prophylaxis: Singulair 10 mg po the day before, the day of and 2 days after daratumumab.    She takes Singulair daily secondary to allergies. Acyclovir 400 mg po BID. Aspirin 81 mg a day. Supportive care: She receives Xgeva monthly (last 03/27/2020).             Symptomatically, she continues to do extremely well.  Labs reviewed.  Day 1 of cycle #7 daratumumab today.                          Begin Revlimid 10 mg day 1 through 21 with each cycle.   Discuss symptom management.  She has antiemetics at home to use on a prn bases.  Interventions are  adequate. 3.   Lytic bone lesions  Anticipate continuation of monthly Xgeva with normal calcium (last received 04/24/2020).  Continue calcium 1800 mg/day and vitamin D.   4.   B12 deficiency Continue monthly B12 (last 05/15/2020).   Next B12 due 06/12/2020.  Folate was > 20 on 10/03/2019.  Check folate annually. 5.   Hypocalcemia  Calcium 8.3 (corrected 8.54).  Increase calcium to 2400 mg a day. 6.   Day 1 of cycle #7 daratumumab. 7.   RTC in 1 week  for labs (BMP) and +/- Xgeva. 8.   RTC in 4 weeks for MD assessment, labs (CBC with diff, CMP, Mg, SPEP, FLCA), and day 1 of cycle #8 daratumumab.  I discussed the assessment and treatment plan with the patient.  The patient was provided an opportunity to ask questions and all were answered.  The patient agreed with the plan and demonstrated an understanding of the instructions.  The patient was advised to call back if the symptoms worsen or if the condition fails to improve as anticipated.   Lequita Asal, MD, PhD 05/29/2020, 9:10 AM  I, Mirian Mo Tufford, am acting as Education administrator for [employer]. Mike Gip, MD, PhD.  I, Montee Tallman C. Mike Gip, MD, have reviewed the above documentation for accuracy and completeness, and I agree with the above.

## 2020-05-28 ENCOUNTER — Other Ambulatory Visit: Payer: Self-pay

## 2020-05-28 DIAGNOSIS — C9000 Multiple myeloma not having achieved remission: Secondary | ICD-10-CM

## 2020-05-29 ENCOUNTER — Inpatient Hospital Stay: Payer: Medicare HMO

## 2020-05-29 ENCOUNTER — Encounter: Payer: Self-pay | Admitting: Hematology and Oncology

## 2020-05-29 ENCOUNTER — Other Ambulatory Visit: Payer: Self-pay

## 2020-05-29 ENCOUNTER — Inpatient Hospital Stay (HOSPITAL_BASED_OUTPATIENT_CLINIC_OR_DEPARTMENT_OTHER): Payer: Medicare HMO | Admitting: Hematology and Oncology

## 2020-05-29 VITALS — BP 155/80 | HR 99 | Temp 97.3°F | Resp 18 | Wt 213.8 lb

## 2020-05-29 VITALS — BP 145/80 | HR 85

## 2020-05-29 DIAGNOSIS — C9000 Multiple myeloma not having achieved remission: Secondary | ICD-10-CM | POA: Diagnosis not present

## 2020-05-29 DIAGNOSIS — Z5112 Encounter for antineoplastic immunotherapy: Secondary | ICD-10-CM | POA: Diagnosis not present

## 2020-05-29 DIAGNOSIS — E538 Deficiency of other specified B group vitamins: Secondary | ICD-10-CM

## 2020-05-29 DIAGNOSIS — M899 Disorder of bone, unspecified: Secondary | ICD-10-CM | POA: Diagnosis not present

## 2020-05-29 LAB — COMPREHENSIVE METABOLIC PANEL
ALT: 16 U/L (ref 0–44)
AST: 16 U/L (ref 15–41)
Albumin: 3.7 g/dL (ref 3.5–5.0)
Alkaline Phosphatase: 81 U/L (ref 38–126)
Anion gap: 10 (ref 5–15)
BUN: 15 mg/dL (ref 8–23)
CO2: 23 mmol/L (ref 22–32)
Calcium: 8.3 mg/dL — ABNORMAL LOW (ref 8.9–10.3)
Chloride: 103 mmol/L (ref 98–111)
Creatinine, Ser: 1.04 mg/dL — ABNORMAL HIGH (ref 0.44–1.00)
GFR, Estimated: 57 mL/min — ABNORMAL LOW (ref >60)
Glucose, Bld: 102 mg/dL — ABNORMAL HIGH (ref 70–99)
Potassium: 4.2 mmol/L (ref 3.5–5.1)
Sodium: 136 mmol/L (ref 135–145)
Total Bilirubin: 0.5 mg/dL (ref 0.3–1.2)
Total Protein: 6.5 g/dL (ref 6.5–8.1)

## 2020-05-29 LAB — CBC WITH DIFFERENTIAL/PLATELET
Abs Immature Granulocytes: 0.02 10*3/uL (ref 0.00–0.07)
Basophils Absolute: 0.1 10*3/uL (ref 0.0–0.1)
Basophils Relative: 2 %
Eosinophils Absolute: 0.5 10*3/uL (ref 0.0–0.5)
Eosinophils Relative: 9 %
HCT: 40.8 % (ref 36.0–46.0)
Hemoglobin: 13.6 g/dL (ref 12.0–15.0)
Immature Granulocytes: 0 %
Lymphocytes Relative: 18 %
Lymphs Abs: 0.9 10*3/uL (ref 0.7–4.0)
MCH: 30.9 pg (ref 26.0–34.0)
MCHC: 33.3 g/dL (ref 30.0–36.0)
MCV: 92.7 fL (ref 80.0–100.0)
Monocytes Absolute: 0.5 10*3/uL (ref 0.1–1.0)
Monocytes Relative: 9 %
Neutro Abs: 3.3 10*3/uL (ref 1.7–7.7)
Neutrophils Relative %: 62 %
Platelets: 299 10*3/uL (ref 150–400)
RBC: 4.4 MIL/uL (ref 3.87–5.11)
RDW: 13.6 % (ref 11.5–15.5)
WBC: 5.4 10*3/uL (ref 4.0–10.5)
nRBC: 0 % (ref 0.0–0.2)

## 2020-05-29 LAB — MAGNESIUM: Magnesium: 2.1 mg/dL (ref 1.7–2.4)

## 2020-05-29 MED ORDER — DIPHENHYDRAMINE HCL 25 MG PO CAPS
50.0000 mg | ORAL_CAPSULE | Freq: Once | ORAL | Status: AC
  Administered 2020-05-29: 50 mg via ORAL
  Filled 2020-05-29: qty 2

## 2020-05-29 MED ORDER — DARATUMUMAB-HYALURONIDASE-FIHJ 1800-30000 MG-UT/15ML ~~LOC~~ SOLN
1800.0000 mg | Freq: Once | SUBCUTANEOUS | Status: AC
  Administered 2020-05-29: 1800 mg via SUBCUTANEOUS
  Filled 2020-05-29: qty 15

## 2020-05-29 MED ORDER — ACETAMINOPHEN 325 MG PO TABS
650.0000 mg | ORAL_TABLET | Freq: Once | ORAL | Status: AC
  Administered 2020-05-29: 650 mg via ORAL
  Filled 2020-05-29: qty 2

## 2020-05-29 MED ORDER — ONDANSETRON HCL 4 MG PO TABS
8.0000 mg | ORAL_TABLET | Freq: Once | ORAL | Status: AC
  Administered 2020-05-29: 8 mg via ORAL
  Filled 2020-05-29: qty 2

## 2020-05-29 MED ORDER — DEXAMETHASONE 4 MG PO TABS
20.0000 mg | ORAL_TABLET | Freq: Once | ORAL | Status: AC
  Administered 2020-05-29: 20 mg via ORAL

## 2020-05-29 NOTE — Progress Notes (Signed)
Patient received prescribed treatment in clinic. Tolerated well. Patient stable at discharge. 

## 2020-05-29 NOTE — Progress Notes (Signed)
Patient needed nausea meds only once. She has felt bad the last three evenings.

## 2020-05-30 LAB — PROTEIN ELECTROPHORESIS, SERUM
A/G Ratio: 1.5 (ref 0.7–1.7)
Albumin ELP: 3.5 g/dL (ref 2.9–4.4)
Alpha-1-Globulin: 0.2 g/dL (ref 0.0–0.4)
Alpha-2-Globulin: 0.7 g/dL (ref 0.4–1.0)
Beta Globulin: 0.8 g/dL (ref 0.7–1.3)
Gamma Globulin: 0.6 g/dL (ref 0.4–1.8)
Globulin, Total: 2.3 g/dL (ref 2.2–3.9)
M-Spike, %: 0.2 g/dL — ABNORMAL HIGH
Total Protein ELP: 5.8 g/dL — ABNORMAL LOW (ref 6.0–8.5)

## 2020-05-30 LAB — KAPPA/LAMBDA LIGHT CHAINS
Kappa free light chain: 91.8 mg/L — ABNORMAL HIGH (ref 3.3–19.4)
Kappa, lambda light chain ratio: 36.72 — ABNORMAL HIGH (ref 0.26–1.65)
Lambda free light chains: 2.5 mg/L — ABNORMAL LOW (ref 5.7–26.3)

## 2020-05-31 LAB — MULTIPLE MYELOMA PANEL, SERUM
Albumin SerPl Elph-Mcnc: 3.4 g/dL (ref 2.9–4.4)
Albumin/Glob SerPl: 1.5 (ref 0.7–1.7)
Alpha 1: 0.2 g/dL (ref 0.0–0.4)
Alpha2 Glob SerPl Elph-Mcnc: 0.7 g/dL (ref 0.4–1.0)
B-Globulin SerPl Elph-Mcnc: 0.9 g/dL (ref 0.7–1.3)
Gamma Glob SerPl Elph-Mcnc: 0.6 g/dL (ref 0.4–1.8)
Globulin, Total: 2.4 g/dL (ref 2.2–3.9)
IgA: 361 mg/dL (ref 64–422)
IgG (Immunoglobin G), Serum: 243 mg/dL — ABNORMAL LOW (ref 586–1602)
IgM (Immunoglobulin M), Srm: 10 mg/dL — ABNORMAL LOW (ref 26–217)
M Protein SerPl Elph-Mcnc: 0.2 g/dL — ABNORMAL HIGH
Total Protein ELP: 5.8 g/dL — ABNORMAL LOW (ref 6.0–8.5)

## 2020-06-05 ENCOUNTER — Other Ambulatory Visit: Payer: Self-pay

## 2020-06-05 ENCOUNTER — Inpatient Hospital Stay: Payer: Medicare HMO | Attending: Hematology and Oncology

## 2020-06-05 ENCOUNTER — Inpatient Hospital Stay: Payer: Medicare HMO

## 2020-06-05 DIAGNOSIS — Z79899 Other long term (current) drug therapy: Secondary | ICD-10-CM | POA: Diagnosis not present

## 2020-06-05 DIAGNOSIS — M899 Disorder of bone, unspecified: Secondary | ICD-10-CM

## 2020-06-05 DIAGNOSIS — C9000 Multiple myeloma not having achieved remission: Secondary | ICD-10-CM | POA: Diagnosis not present

## 2020-06-05 LAB — BASIC METABOLIC PANEL
Anion gap: 10 (ref 5–15)
BUN: 19 mg/dL (ref 8–23)
CO2: 26 mmol/L (ref 22–32)
Calcium: 8.5 mg/dL — ABNORMAL LOW (ref 8.9–10.3)
Chloride: 99 mmol/L (ref 98–111)
Creatinine, Ser: 1.18 mg/dL — ABNORMAL HIGH (ref 0.44–1.00)
GFR, Estimated: 49 mL/min — ABNORMAL LOW (ref >60)
Glucose, Bld: 106 mg/dL — ABNORMAL HIGH (ref 70–99)
Potassium: 4.1 mmol/L (ref 3.5–5.1)
Sodium: 135 mmol/L (ref 135–145)

## 2020-06-10 ENCOUNTER — Other Ambulatory Visit: Payer: Self-pay

## 2020-06-10 ENCOUNTER — Telehealth: Payer: Self-pay | Admitting: Hematology and Oncology

## 2020-06-10 NOTE — Telephone Encounter (Signed)
06/10/2020 Spoke w/ pt and informed her that dr. c would like her to come back in for repeat BMP to check Calcium levels, and get her xgeva if they are good. appt made for 06/13/20 @ 10 am. Pt confirmed appts  SRW

## 2020-06-13 ENCOUNTER — Inpatient Hospital Stay: Payer: Medicare HMO | Attending: Hematology and Oncology

## 2020-06-13 ENCOUNTER — Other Ambulatory Visit: Payer: Self-pay

## 2020-06-13 ENCOUNTER — Inpatient Hospital Stay: Payer: Medicare HMO

## 2020-06-13 VITALS — BP 136/85 | HR 93 | Resp 20

## 2020-06-13 DIAGNOSIS — E538 Deficiency of other specified B group vitamins: Secondary | ICD-10-CM | POA: Insufficient documentation

## 2020-06-13 DIAGNOSIS — J45909 Unspecified asthma, uncomplicated: Secondary | ICD-10-CM | POA: Insufficient documentation

## 2020-06-13 DIAGNOSIS — Z7982 Long term (current) use of aspirin: Secondary | ICD-10-CM | POA: Insufficient documentation

## 2020-06-13 DIAGNOSIS — C9000 Multiple myeloma not having achieved remission: Secondary | ICD-10-CM | POA: Diagnosis present

## 2020-06-13 DIAGNOSIS — Z7951 Long term (current) use of inhaled steroids: Secondary | ICD-10-CM | POA: Insufficient documentation

## 2020-06-13 DIAGNOSIS — Z79899 Other long term (current) drug therapy: Secondary | ICD-10-CM | POA: Diagnosis not present

## 2020-06-13 DIAGNOSIS — Z87311 Personal history of (healed) other pathological fracture: Secondary | ICD-10-CM | POA: Insufficient documentation

## 2020-06-13 DIAGNOSIS — Z803 Family history of malignant neoplasm of breast: Secondary | ICD-10-CM | POA: Insufficient documentation

## 2020-06-13 DIAGNOSIS — Z7952 Long term (current) use of systemic steroids: Secondary | ICD-10-CM | POA: Diagnosis not present

## 2020-06-13 DIAGNOSIS — Z5112 Encounter for antineoplastic immunotherapy: Secondary | ICD-10-CM | POA: Diagnosis not present

## 2020-06-13 DIAGNOSIS — D649 Anemia, unspecified: Secondary | ICD-10-CM

## 2020-06-13 LAB — BASIC METABOLIC PANEL
Anion gap: 11 (ref 5–15)
BUN: 15 mg/dL (ref 8–23)
CO2: 26 mmol/L (ref 22–32)
Calcium: 8.6 mg/dL — ABNORMAL LOW (ref 8.9–10.3)
Chloride: 100 mmol/L (ref 98–111)
Creatinine, Ser: 1.14 mg/dL — ABNORMAL HIGH (ref 0.44–1.00)
GFR, Estimated: 51 mL/min — ABNORMAL LOW (ref >60)
Glucose, Bld: 107 mg/dL — ABNORMAL HIGH (ref 70–99)
Potassium: 4.3 mmol/L (ref 3.5–5.1)
Sodium: 137 mmol/L (ref 135–145)

## 2020-06-13 MED ORDER — DENOSUMAB 120 MG/1.7ML ~~LOC~~ SOLN: 120.0000 mg | Freq: Once | SUBCUTANEOUS | Status: DC

## 2020-06-13 NOTE — Progress Notes (Signed)
Unable to get xgeva today due to low calcium, pt instructed to increase her calcium to 5 tablets per day or 3000mg . She verbalizes u/o, vss

## 2020-06-17 ENCOUNTER — Inpatient Hospital Stay: Payer: Medicare HMO

## 2020-06-19 ENCOUNTER — Other Ambulatory Visit: Payer: Self-pay

## 2020-06-19 MED ORDER — LENALIDOMIDE 10 MG PO CAPS: 10.0000 mg | ORAL_CAPSULE | Freq: Every day | ORAL | 0 refills | Status: DC

## 2020-06-26 ENCOUNTER — Encounter: Payer: Self-pay | Admitting: Hematology and Oncology

## 2020-06-26 NOTE — Progress Notes (Signed)
[facility]  [address], [address] [place], [place] [address] Phone: [phone]  Fax: [phone]   Clinic Day:  06/27/2020  Referring physician: Denton Lank, MD  Chief Complaint: Laurie [name] is a 75 y.o. female with multiple myeloma who is seen for assessment prior to day 1 of cycle #8 daratumumab, Revlimid and Decadron.  HPI: The patient was last seen in the hematology clinic on 05/29/2020. At that time, she felt "ok."  She had been very tired and had mild nausea. She was sleeping well. She denied vomiting and diarrhea. She denied any other new symptoms.  Exam was stable. Hematocrit was 40.8, hemoglobin 13.6, platelets 299,000, WBC 5,400 (ANC 3300). Creatiine was 1.04 (CrCl 57 ml/min). Calcium was 8.3 with an albumin of 3.7. M-spike was 0.2 gm/dL. Kappa free light chains were 91.8, lambda free light chains 2.5, and ratio 36.72. She was to increase calcium to 2,400 mg daily. She received cycle #7 daratumumab.  BMP followed: 06/05/2020: Creatinine 1.18 (CrCl 49 ml/min). Calcium 8.5. 06/13/2020: Creatinine 1.14 (CrCl 51 ml/min). Calcium 8.6.  Delton See was held on 06/13/2020 secondary to a low calcium. Patient was told to increase calcium to 3000 mg daily.  During the interim, she has been good. She had a few days of nausea and fatigue. She took Zofran a few times, which helped. Her appetite is poor. She is still having sweats and on and off cough. Her back pain is "alright." She denies infections or bone pain.  She received Revlimid 10 mg yesterday. She took Revlimid yesterday.  She takes calcium 3,000 mg daily. She takes acyclovir and aspirin as prescribed. She takes Singulair everyday.   Past Medical History:  Diagnosis Date  . Anxiety   . Asthma   . Depression   . Head injury   . Osteoporosis     Past Surgical History:  Procedure Laterality Date  . ABDOMINAL HYSTERECTOMY    . HEMORROIDECTOMY    . SEPTOPLASTY      Family History  Problem  Relation Age of Onset  . Breast cancer Cousin        mat cousin    Social History:  reports that she has never smoked. She has never used smokeless tobacco. She reports that she does not drink alcohol and does not use drugs. She denies any exposure to radiation or toxins. She denies alcohol and tobacco use. Her husband has schizophrenia and committed suicide. The patient is retired and has not worked since 73. 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  She then worked in the [employer] x 5 years until disability at age 75.  She lives in [place], [place]. She has a Neurosurgeon named Laurie [name].  She is a Sales promotion account executive [employer], and will not accept blood products.  The patient is alone today.  Allergies:  Allergies  Allergen Reactions  . Levofloxacin     Critical   . Other Other (See Comments)    Antihistamines    Current Medications: Current Outpatient Medications  Medication Sig Dispense Refill  . acyclovir (ZOVIRAX) 400 MG tablet Take 1 tablet (400 mg total) by mouth 2 (two) times daily. 60 tablet 11  . alendronate (FOSAMAX) 70 MG tablet 70 mg once a week.    Marland Kitchen aspirin 81 MG chewable tablet Chew by mouth daily.    Marland Kitchen atorvastatin (LIPITOR) 20 MG tablet Take 20 mg by mouth daily.    . busPIRone (BUSPAR) 10  MG tablet Take 10 mg by mouth 2 (two) times daily.    . Calcium Carb-Cholecalciferol (CALCIUM 600 + D PO) Take 600 mg by mouth 5 (five) times daily. Increase calcium from $RemoveBefo'600mg'yrXgnlQTDQs$  4 times a day to $Remo'600mg'sILlI$  5 times a day starting today    . docusate sodium (COLACE) 100 MG capsule Take 100 mg by mouth daily.    Marland Kitchen lenalidomide (REVLIMID) 10 MG capsule Take 1 capsule (10 mg total) by mouth daily. Three weeks on, 1 week off. [employer] Auth # [id number] Date obtained: Jun 19, 2020 21 capsule 0  . montelukast (SINGULAIR) 10 MG tablet Take 10 mg by mouth daily after breakfast.    . naproxen (NAPROSYN) 500 MG tablet Take 500 mg by mouth 2 (two) times daily with a meal.     . nortriptyline (PAMELOR) 25 MG capsule Take by mouth daily. At night    . omeprazole (PRILOSEC) 20 MG capsule Take 20 mg by mouth daily.    . ondansetron (ZOFRAN) 8 MG tablet Take 1 tablet (8 mg total) by mouth every 8 (eight) hours as needed (Nausea or vomiting). 30 tablet 1  . sertraline (ZOLOFT) 100 MG tablet Take 100 mg by mouth 2 (two) times daily.    . traZODone (DESYREL) 100 MG tablet Take 50 mg by mouth at bedtime.    . Vitamin D, Ergocalciferol, (DRISDOL) 50000 UNITS CAPS capsule Take 50,000 Units by mouth every 7 (seven) days. Once a month    . albuterol (VENTOLIN HFA) 108 (90 Base) MCG/ACT inhaler 2 inhalations every 4 (four) hours as needed. (Patient not taking: No sig reported)    . dexamethasone (DECADRON) 4 MG tablet 5 TAB BY MOUTH ONCE A WEEK. 5 TAB WEEKLY THE DAY AFTER DARATUMUMAB X 8 WEEKS. TAKE WITH BREAKFAST. (Patient not taking: Reported on 06/26/2020) 40 tablet 0  . fluticasone-salmeterol (ADVAIR HFA) 230-21 MCG/ACT inhaler Inhale into the lungs. (Patient not taking: No sig reported)     No current facility-administered medications for this visit.    Review of Systems  Constitutional: Positive for diaphoresis (occasional sweats) and malaise/fatigue (for a few days). Negative for chills, fever and weight loss (2 lbs).       Feels "okay."  HENT: Negative for congestion, ear discharge, ear pain, hearing loss, nosebleeds, sinus pain, sore throat and tinnitus.   Eyes: Negative for blurred vision and double vision.  Respiratory: Positive for cough (on and off). Negative for hemoptysis, sputum production and shortness of breath.        Asthma  Cardiovascular: Negative for chest pain, palpitations and leg swelling.  Gastrointestinal: Positive for nausea (managed with Zofran). Negative for abdominal pain, blood in stool, constipation, diarrhea, heartburn (on medication), melena and vomiting.       Poor appetite  Genitourinary: Negative for dysuria, frequency, hematuria and  urgency.  Musculoskeletal: Positive for back pain (lower). Negative for joint pain, myalgias and neck pain.  Skin: Negative for itching and rash.  Neurological: Negative for dizziness, tingling, sensory change, weakness and headaches.       Occasional balance problems. Traumatic brain injury (fall) 3 years ago.  Endo/Heme/Allergies: Does not bruise/bleed easily.  Psychiatric/Behavioral: Negative for depression and memory loss. The patient is not nervous/anxious and does not have insomnia.   All other systems reviewed and are negative.  Performance status (ECOG): 1  Vitals Blood pressure (!) 156/84, pulse (!) 103, temperature (!) 96.5 F (35.8 C), resp. rate 16, weight 215 lb 2.7 oz (97.6 kg).   Physical Exam  Vitals and nursing note reviewed.  Constitutional:      General: She is not in acute distress.    Appearance: Normal appearance. She is well-developed.     Interventions: Face mask in place.  HENT:     Head: Normocephalic and atraumatic.     Comments: Short gray hair.    Mouth/Throat:     Mouth: Mucous membranes are moist. No oral lesions.     Pharynx: Oropharynx is clear.  Eyes:     Extraocular Movements: Extraocular movements intact.     Conjunctiva/sclera: Conjunctivae normal.     Pupils: Pupils are equal, round, and reactive to light.     Comments: Glasses. Blue eyes.  Cardiovascular:     Rate and Rhythm: Normal rate and regular rhythm.     Heart sounds: Normal heart sounds. No murmur heard. No friction rub. No gallop.   Pulmonary:     Effort: Pulmonary effort is normal. No respiratory distress.  Chest:     Chest wall: No tenderness.  Breasts:     Right: No axillary adenopathy or supraclavicular adenopathy.     Left: No axillary adenopathy or supraclavicular adenopathy.    Abdominal:     General: Bowel sounds are normal. There is no distension.     Palpations: Abdomen is soft. There is no hepatomegaly, splenomegaly or mass.     Tenderness: There is no abdominal  tenderness. There is no guarding or rebound.  Musculoskeletal:        General: No swelling or tenderness. Normal range of motion.     Cervical back: Normal range of motion and neck supple.  Lymphadenopathy:     Head:     Right side of head: No preauricular, posterior auricular or occipital adenopathy.     Left side of head: No preauricular, posterior auricular or occipital adenopathy.     Cervical: No cervical adenopathy.     Upper Body:     Right upper body: No supraclavicular or axillary adenopathy.     Left upper body: No supraclavicular or axillary adenopathy.     Lower Body: No right inguinal adenopathy. No left inguinal adenopathy.  Skin:    General: Skin is warm and dry.     Findings: No bruising, erythema, lesion or rash.  Neurological:     Mental Status: She is alert and oriented to person, place, and time.  Psychiatric:        Behavior: Behavior normal.        Thought Content: Thought content normal.        Judgment: Judgment normal.    Appointment on 06/27/2020  Component Date Value Ref Range Status  . Sodium 06/27/2020 138  135 - 145 mmol/L Final  . Potassium 06/27/2020 3.9  3.5 - 5.1 mmol/L Final  . Chloride 06/27/2020 101  98 - 111 mmol/L Final  . CO2 06/27/2020 27  22 - 32 mmol/L Final  . Glucose, Bld 06/27/2020 118* 70 - 99 mg/dL Final   Glucose reference range applies only to samples taken after fasting for at least 8 hours.  . BUN 06/27/2020 20  8 - 23 mg/dL Final  . Creatinine, Ser 06/27/2020 1.20* 0.44 - 1.00 mg/dL Final  . Calcium 06/27/2020 9.1  8.9 - 10.3 mg/dL Final  . Total Protein 06/27/2020 6.3* 6.5 - 8.1 g/dL Final  . Albumin 06/27/2020 4.0  3.5 - 5.0 g/dL Final  . AST 06/27/2020 14* 15 - 41 U/L Final  . ALT 06/27/2020 12  0 - 44  U/L Final  . Alkaline Phosphatase 06/27/2020 71  38 - 126 U/L Final  . Total Bilirubin 06/27/2020 0.5  0.3 - 1.2 mg/dL Final  . GFR, Estimated 06/27/2020 48* >60 mL/min Final   Comment: (NOTE) Calculated using the  CKD-EPI Creatinine Equation (2021)   . Anion gap 06/27/2020 10  5 - 15 Final   Performed at [facility], [address] [place], [place] [address]  . Magnesium 06/27/2020 2.1  1.7 - 2.4 mg/dL Final   Performed at [facility], [address] [place], [place] [address]  . WBC 06/27/2020 5.4  4.0 - 10.5 K/uL Final  . RBC 06/27/2020 4.59  3.87 - 5.11 MIL/uL Final  . Hemoglobin 06/27/2020 14.0  12.0 - 15.0 g/dL Final  . HCT 06/27/2020 42.4  36.0 - 46.0 % Final  . MCV 06/27/2020 92.4  80.0 - 100.0 fL Final  . MCH 06/27/2020 30.5  26.0 - 34.0 pg Final  . MCHC 06/27/2020 33.0  30.0 - 36.0 g/dL Final  . RDW 06/27/2020 13.3  11.5 - 15.5 % Final  . Platelets 06/27/2020 322  150 - 400 K/uL Final  . nRBC 06/27/2020 0.0  0.0 - 0.2 % Final  . Neutrophils Relative % 06/27/2020 59  % Final  . Neutro Abs 06/27/2020 3.2  1.7 - 7.7 K/uL Final  . Lymphocytes Relative 06/27/2020 20  % Final  . Lymphs Abs 06/27/2020 1.1  0.7 - 4.0 K/uL Final  . Monocytes Relative 06/27/2020 9  % Final  . Monocytes Absolute 06/27/2020 0.5  0.1 - 1.0 K/uL Final  . Eosinophils Relative 06/27/2020 9  % Final  . Eosinophils Absolute 06/27/2020 0.5  0.0 - 0.5 K/uL Final  . Basophils Relative 06/27/2020 2  % Final  . Basophils Absolute 06/27/2020 0.1  0.0 - 0.1 K/uL Final  . Immature Granulocytes 06/27/2020 1  % Final  . Abs Immature Granulocytes 06/27/2020 0.03  0.00 - 0.07 K/uL Final   Performed at [facility], [address], [place], [place] [address]    Assessment:  Laurie [name] is a 75 y.o. female with stage III biclonal IgA multiple myeloma.  SPEP was 5.0 gm/dL on 10/03/2019. Random urine revealed 196.6 mg/dL total protein with 58.3% M-spike.   Bone marrow biopsy on 11/13/2019 revealed a hypercellular bone marrow with extensive involvement by plasma cell neoplasm. Atypical plasma cells represented 70% of all cells in the aspirate and was associated with prominent interstitial  infiltrates and diffuse sheets in the clot.  Plasma cells were kappa light chain restricted.   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     Labs on 10/03/2019 revealed a hematocrit 27.9, hemoglobin 9.4, MCV 108, platelets 287,000, WBC 3,800 (ANC 2400).   Creatinine was 0.94, albumin 3.4, and protein 11.2.  Ferritin was 82 with an iron saturation of 30% and a TIBC of 206.  Hepatitis C antibody and HIV testing were negative.    Work-up on 10/26/2019 revealed a hematocrit was 27.4, hemoglobin 9.1, MCV 109.6, platelets 250,000, WBC 3,400 (ANC 2,100).  Retic was 2.2%.  Creatinine was 1.06 and calcium 8.6. Total protein was 11.4 and albumin 3.2.  M-spike was 5.7 gm/dL biclonal IgA protein with kappa specificity.  Kappa free  light chain were 1,376.3, lambda free light chains <1.5, and ratio > 917.53 (0.26-1.65).  Beta 2 microglobulin was 10.3 (0.6 - 2.4). LDH was 102. TSH was 3.673.  Hepatitis serologies were negative on 11/28/2019.  24 hour UPEP on 10/30/2019 revealed 1020 mg/24 hours protein with 65.3% M-spike (665.9 mg/24 hours).  Urine free kappa light chain were 2,620.44, free lambda light chain 6.01 and ratio 436.01 (1.03-31.76).  Bone survey on 10/26/2019 revealed small lytic lesions in the skull. There was no acute fracture.  SPEP has been followed (gm/dL): 5.7 on 10/26/2019, 3.5 on 12/27/2019, 2.3 on 01/24/2020, 1.0 on 02/28/2020, 0.7 on 03/27/2020, 0.5 on 05/01/2020, and 0.2 on 05/29/2020. Kappa free light chains have been followed (mg/L): 1376.3 on 10/26/2019, 768 on 01/03/2020, 578.7 on 01/24/2020, 440.6 (ratio 220.30), 306.4 (ratio 127.67) on 03/27/2020, 154.7 (ratio 70.32) on 05/01/2020, and 91.8 (ratio 36.72) on  05/29/2020.  She is day 1 of cycle # 8 daratumumab SQ and Decadron (11/29/2019 - 06/27/2020).  She began monthly Xgeva on 12/05/2019 (last 04/24/2020).  She has B12 deficiency. Vitamin B12 was 200 (low) and folate >20.0 on 10/03/2019.  She was on weekly B12 injections and is now on monthly B12 injections (last 05/15/2020).  Antiparietal antibody and intrinsic factor antibodies were negative on 12/05/2019.  Chest CT without contrast on 03/27/2020 revealed numerous healed bilateral rib fractures. CXR abnormality was due to a healed rib fracture with callus formation.  She is a Restaurant manager, fast food.  She receives Retacrit (last 12/13/2019) and IV iron support.  Ferritin was 66 on 02/28/2020.  She received Venofer on 12/05/2019, 12/13/2019, and 01/17/2020.  She received both doses of the COVID-19 vaccine in 08/2019.  Symptomatically, she feels good. She had a few days of nausea and fatigue. She has sweats and on and off cough. She denies infections and bone pain.  Exam is stable.  Plan: 1.   Labs today: CBC with diff, CMP, Mg, SPEP, FLCA 2.   Stage III multiple myeloma Patient was diagnosed with a 5.7 gm/dL biclonal IgA gammopathy with kappa specificity.                         M-spike was 0.2 on 06/27/2020. Kappa free light chains were 1,376.3 with a ratio >917.53 (0.26-1.65) on 10/26/2019.                          Kappa free light chains were 66.0 on 06/27/2020. Bone survey reveals lytic lesions in skull. She receives [address] (cycle length 28 days). Daratumumab SQ 1800 mg on days 1 cycles #7 and beyond Revlimid 10 mg po day 1-21 (dose increased with cycle #7). Decadron 20 mg po days 1-2 (consider Decadron 20 mg weekly if tolerated). Prophylaxis: Singulair 10 mg po the day before, the day of and 2 days after daratumumab.    She takes  Singulair daily secondary to allergies. Acyclovir 400 mg po BID. Aspirin 81 mg a day. Supportive care: She receives Xgeva monthly (last 04/24/2020).             Symptomatically, she is doing well.  Labs reviewed.  Day 1 of cycle #8 daratumumab today.                          Begin Revlimid 10 mg a day 1 through 21 with each cycle.    Consider increasing Revlimid if tolerated.  Discuss symptom management.  She has antiemetics at home to use on a prn bases.  Interventions are adequate.  3.   Lytic bone lesions  Calcium 9.1.  Continue calcium 3000 mg/day and vitamin D.   Xgeva today. 4.   B12 deficiency Continue monthly B12 (last 05/15/2020).  B12 today.  Folate was > 20 on 10/03/2019.  Check folate annually. 5.   Hypocalcemia, resolved  Calcium 9.1.  She is currently on calcium 3000 mg a day.  Continue to monitor closely 6.   Xgeva today. 7.   Cycle #8 daratumumab today. 8.   B12 today and monthly x 6. 9.   RTC in 2 weeks for labs (CBC with diff, BMP). 10.   RTC in 4 weeks for MD assessment, labs (CBC with diff, CMP, Mg, SPEP, FLCA), B12, and day 1 of cycle #9 daratumumab.  I discussed the assessment and treatment plan with the patient.  The patient was provided an opportunity to ask questions and all were answered.  The patient agreed with the plan and demonstrated an understanding of the instructions.  The patient was advised to call back if the symptoms worsen or if the condition fails to improve as anticipated.   Lequita Asal, MD, PhD 06/27/2020, 10:10 AM  I, Mirian Mo Tufford, am acting as Education administrator for [employer]. Mike Gip, MD, PhD.  I, Melissa C. Mike Gip, MD, have reviewed the above documentation for accuracy and completeness, and I agree with the above.

## 2020-06-27 ENCOUNTER — Inpatient Hospital Stay (HOSPITAL_BASED_OUTPATIENT_CLINIC_OR_DEPARTMENT_OTHER): Payer: Medicare HMO | Admitting: Hematology and Oncology

## 2020-06-27 ENCOUNTER — Inpatient Hospital Stay: Payer: Medicare HMO

## 2020-06-27 ENCOUNTER — Encounter: Payer: Self-pay | Admitting: Hematology and Oncology

## 2020-06-27 ENCOUNTER — Other Ambulatory Visit: Payer: Self-pay

## 2020-06-27 VITALS — BP 136/74 | HR 85 | Temp 96.5°F | Resp 18

## 2020-06-27 VITALS — BP 156/84 | HR 103 | Temp 96.5°F | Resp 16 | Wt 215.2 lb

## 2020-06-27 DIAGNOSIS — M898X9 Other specified disorders of bone, unspecified site: Secondary | ICD-10-CM

## 2020-06-27 DIAGNOSIS — E538 Deficiency of other specified B group vitamins: Secondary | ICD-10-CM

## 2020-06-27 DIAGNOSIS — D649 Anemia, unspecified: Secondary | ICD-10-CM

## 2020-06-27 DIAGNOSIS — C9000 Multiple myeloma not having achieved remission: Secondary | ICD-10-CM

## 2020-06-27 DIAGNOSIS — M899 Disorder of bone, unspecified: Secondary | ICD-10-CM | POA: Diagnosis not present

## 2020-06-27 DIAGNOSIS — Z5112 Encounter for antineoplastic immunotherapy: Secondary | ICD-10-CM | POA: Diagnosis not present

## 2020-06-27 LAB — CBC WITH DIFFERENTIAL/PLATELET
Abs Immature Granulocytes: 0.03 10*3/uL (ref 0.00–0.07)
Basophils Absolute: 0.1 10*3/uL (ref 0.0–0.1)
Basophils Relative: 2 %
Eosinophils Absolute: 0.5 10*3/uL (ref 0.0–0.5)
Eosinophils Relative: 9 %
HCT: 42.4 % (ref 36.0–46.0)
Hemoglobin: 14 g/dL (ref 12.0–15.0)
Immature Granulocytes: 1 %
Lymphocytes Relative: 20 %
Lymphs Abs: 1.1 10*3/uL (ref 0.7–4.0)
MCH: 30.5 pg (ref 26.0–34.0)
MCHC: 33 g/dL (ref 30.0–36.0)
MCV: 92.4 fL (ref 80.0–100.0)
Monocytes Absolute: 0.5 10*3/uL (ref 0.1–1.0)
Monocytes Relative: 9 %
Neutro Abs: 3.2 10*3/uL (ref 1.7–7.7)
Neutrophils Relative %: 59 %
Platelets: 322 10*3/uL (ref 150–400)
RBC: 4.59 MIL/uL (ref 3.87–5.11)
RDW: 13.3 % (ref 11.5–15.5)
WBC: 5.4 10*3/uL (ref 4.0–10.5)
nRBC: 0 % (ref 0.0–0.2)

## 2020-06-27 LAB — COMPREHENSIVE METABOLIC PANEL
ALT: 12 U/L (ref 0–44)
AST: 14 U/L — ABNORMAL LOW (ref 15–41)
Albumin: 4 g/dL (ref 3.5–5.0)
Alkaline Phosphatase: 71 U/L (ref 38–126)
Anion gap: 10 (ref 5–15)
BUN: 20 mg/dL (ref 8–23)
CO2: 27 mmol/L (ref 22–32)
Calcium: 9.1 mg/dL (ref 8.9–10.3)
Chloride: 101 mmol/L (ref 98–111)
Creatinine, Ser: 1.2 mg/dL — ABNORMAL HIGH (ref 0.44–1.00)
GFR, Estimated: 48 mL/min — ABNORMAL LOW (ref >60)
Glucose, Bld: 118 mg/dL — ABNORMAL HIGH (ref 70–99)
Potassium: 3.9 mmol/L (ref 3.5–5.1)
Sodium: 138 mmol/L (ref 135–145)
Total Bilirubin: 0.5 mg/dL (ref 0.3–1.2)
Total Protein: 6.3 g/dL — ABNORMAL LOW (ref 6.5–8.1)

## 2020-06-27 LAB — MAGNESIUM: Magnesium: 2.1 mg/dL (ref 1.7–2.4)

## 2020-06-27 MED ORDER — CYANOCOBALAMIN 1000 MCG/ML IJ SOLN
1000.0000 ug | Freq: Once | INTRAMUSCULAR | Status: AC
  Administered 2020-06-27: 1000 ug via INTRAMUSCULAR
  Filled 2020-06-27: qty 1

## 2020-06-27 MED ORDER — DARATUMUMAB-HYALURONIDASE-FIHJ 1800-30000 MG-UT/15ML ~~LOC~~ SOLN
1800.0000 mg | Freq: Once | SUBCUTANEOUS | Status: AC
  Administered 2020-06-27: 1800 mg via SUBCUTANEOUS
  Filled 2020-06-27: qty 15

## 2020-06-27 MED ORDER — HEPARIN SOD (PORK) LOCK FLUSH 100 UNIT/ML IV SOLN
500.0000 [IU] | Freq: Once | INTRAVENOUS | Status: DC | PRN
  Filled 2020-06-27: qty 5

## 2020-06-27 MED ORDER — ONDANSETRON HCL 4 MG PO TABS
8.0000 mg | ORAL_TABLET | Freq: Once | ORAL | Status: AC
  Administered 2020-06-27: 8 mg via ORAL
  Filled 2020-06-27: qty 2

## 2020-06-27 MED ORDER — ACETAMINOPHEN 325 MG PO TABS
650.0000 mg | ORAL_TABLET | Freq: Once | ORAL | Status: AC
  Administered 2020-06-27: 650 mg via ORAL
  Filled 2020-06-27: qty 2

## 2020-06-27 MED ORDER — DEXAMETHASONE 4 MG PO TABS
20.0000 mg | ORAL_TABLET | Freq: Once | ORAL | Status: AC
  Administered 2020-06-27: 20 mg via ORAL
  Filled 2020-06-27: qty 5

## 2020-06-27 MED ORDER — DIPHENHYDRAMINE HCL 25 MG PO CAPS
50.0000 mg | ORAL_CAPSULE | Freq: Once | ORAL | Status: AC
  Administered 2020-06-27: 50 mg via ORAL
  Filled 2020-06-27: qty 2

## 2020-06-27 MED ORDER — DENOSUMAB 120 MG/1.7ML ~~LOC~~ SOLN
120.0000 mg | Freq: Once | SUBCUTANEOUS | Status: AC
  Administered 2020-06-27: 120 mg via SUBCUTANEOUS

## 2020-06-27 NOTE — Progress Notes (Signed)
Patient reports occasional nausea and does take Zofran with some relief.  Would like to discuss possibility of eating peanut butter.

## 2020-06-28 LAB — KAPPA/LAMBDA LIGHT CHAINS
Kappa free light chain: 66 mg/L — ABNORMAL HIGH (ref 3.3–19.4)
Kappa, lambda light chain ratio: 23.57 — ABNORMAL HIGH (ref 0.26–1.65)
Lambda free light chains: 2.8 mg/L — ABNORMAL LOW (ref 5.7–26.3)

## 2020-07-01 LAB — MULTIPLE MYELOMA PANEL, SERUM
Albumin SerPl Elph-Mcnc: 3.6 g/dL (ref 2.9–4.4)
Albumin/Glob SerPl: 1.7 (ref 0.7–1.7)
Alpha 1: 0.2 g/dL (ref 0.0–0.4)
Alpha2 Glob SerPl Elph-Mcnc: 0.7 g/dL (ref 0.4–1.0)
B-Globulin SerPl Elph-Mcnc: 0.9 g/dL (ref 0.7–1.3)
Gamma Glob SerPl Elph-Mcnc: 0.4 g/dL (ref 0.4–1.8)
Globulin, Total: 2.2 g/dL (ref 2.2–3.9)
IgA: <5 mg/dL — ABNORMAL LOW (ref 64–422)
IgG (Immunoglobin G), Serum: 249 mg/dL — ABNORMAL LOW (ref 586–1602)
IgM (Immunoglobulin M), Srm: 9 mg/dL — ABNORMAL LOW (ref 26–217)
M Protein SerPl Elph-Mcnc: 0.2 g/dL — ABNORMAL HIGH
Total Protein ELP: 5.8 g/dL — ABNORMAL LOW (ref 6.0–8.5)

## 2020-07-02 ENCOUNTER — Ambulatory Visit
Admission: EM | Admit: 2020-07-02 | Discharge: 2020-07-02 | Disposition: A | Discharge status: Home / Self Care | Payer: Medicare HMO | Attending: Family Medicine | Admitting: Family Medicine

## 2020-07-02 ENCOUNTER — Telehealth: Payer: Self-pay | Admitting: Hematology and Oncology

## 2020-07-02 ENCOUNTER — Encounter: Payer: Self-pay | Admitting: Emergency Medicine

## 2020-07-02 ENCOUNTER — Other Ambulatory Visit: Payer: Self-pay

## 2020-07-02 ENCOUNTER — Ambulatory Visit (INDEPENDENT_AMBULATORY_CARE_PROVIDER_SITE_OTHER)
Admit: 2020-07-02 | Discharge: 2020-07-02 | Disposition: A | Discharge status: Home / Self Care | Payer: Medicare HMO | Attending: Family Medicine | Admitting: Family Medicine

## 2020-07-02 DIAGNOSIS — R42 Dizziness and giddiness: Secondary | ICD-10-CM

## 2020-07-02 DIAGNOSIS — S0990XA Unspecified injury of head, initial encounter: Secondary | ICD-10-CM | POA: Diagnosis not present

## 2020-07-02 DIAGNOSIS — W010XXA Fall on same level from slipping, tripping and stumbling without subsequent striking against object, initial encounter: Secondary | ICD-10-CM | POA: Diagnosis not present

## 2020-07-02 NOTE — Discharge Instructions (Signed)
CT scan with no evidence of fracture/bleeding.  Tylenol as needed.  Take care  Dr. Lacinda Axon

## 2020-07-02 NOTE — ED Provider Notes (Signed)
[facility]    CSN: [id number] Arrival date & time: 07/02/20  1109      History   Chief Complaint Chief Complaint  Patient presents with  . Fall  . Head Injury   HPI  75 year old female presents with the above complaints.  Patient states that she suffered a fall after she tripped over an object on the floor.  She states that she fell back and hit the back of her head.  This was on a carpeted floor.  Patient states that she has had some dizziness associated with chemotherapy.  Denies nausea, vomiting.  No syncope.  No significant headache at this time.  Patient states that she was advised to come in for evaluation and get a "x-ray".  She has known multiple myeloma with lytic lesions of the skull.  Past Medical History:  Diagnosis Date  . Anxiety   . Asthma   . Depression   . Head injury   . Osteoporosis     Patient Active Problem List   Diagnosis Date Noted  . Hypocalcemia 03/13/2020  . Chemotherapy-induced neutropenia ([place]) 01/28/2020  . Encounter for antineoplastic immunotherapy 12/03/2019  . Multiple myeloma not having achieved remission ([place]) 11/23/2019  . Anemia 11/23/2019  . Lytic bone lesions on xray 11/13/2019  . Goals of care, counseling/discussion 11/13/2019  . Monoclonal gammopathy 10/26/2019  . B12 deficiency 10/26/2019  . Macrocytic anemia 10/26/2019    Past Surgical History:  Procedure Laterality Date  . ABDOMINAL HYSTERECTOMY    . HEMORROIDECTOMY    . SEPTOPLASTY      OB History   No obstetric history on file.      Home Medications    Prior to Admission medications   Medication Sig Start Date End Date Taking? Authorizing Provider  acyclovir (ZOVIRAX) 400 MG tablet Take 1 tablet (400 mg total) by mouth 2 (two) times daily. 11/23/19  Yes Corcoran, Drue Second, MD  albuterol (VENTOLIN HFA) 108 (90 Base) MCG/ACT inhaler 2 inhalations every 4 (four) hours as needed.   Yes [provider]  alendronate (FOSAMAX) 70 MG tablet 70  mg once a week. 11/21/15  Yes [provider]  aspirin 81 MG chewable tablet Chew by mouth daily.   Yes [provider]  atorvastatin (LIPITOR) 20 MG tablet Take 20 mg by mouth daily.   Yes [provider]  busPIRone (BUSPAR) 10 MG tablet Take 10 mg by mouth 2 (two) times daily.   Yes [provider]  Calcium Carb-Cholecalciferol (CALCIUM 600 + D PO) Take 600 mg by mouth 5 (five) times daily. Increase calcium from $RemoveBefo'600mg'EPzXuCkOOMj$  4 times a day to $Remo'600mg'GfmUU$  5 times a day starting today 06/13/20  Yes [provider]  docusate sodium (COLACE) 100 MG capsule Take 100 mg by mouth daily.   Yes [provider]  fluticasone-salmeterol (ADVAIR HFA) 230-21 MCG/ACT inhaler Inhale into the lungs. 01/28/17  Yes [provider]  lenalidomide (REVLIMID) 10 MG capsule Take 1 capsule (10 mg total) by mouth daily. Three weeks on, 1 week off. [employer] Auth # [id number] Date obtained: Jun 19, 2020 06/19/20  Yes Corcoran, Drue Second, MD  montelukast (SINGULAIR) 10 MG tablet Take 10 mg by mouth daily after breakfast.   Yes [provider]  naproxen (NAPROSYN) 500 MG tablet Take 500 mg by mouth 2 (two) times daily with a meal.   Yes [provider]  nortriptyline (PAMELOR) 25 MG capsule Take by mouth daily. At night 05/26/16  Yes [provider]  omeprazole (PRILOSEC) 20 MG capsule Take 20 mg by mouth daily.   Yes [provider]  sertraline (ZOLOFT) 100 MG tablet Take 100 mg by mouth 2 (two) times daily.   Yes [provider]  traZODone (DESYREL) 100 MG tablet Take 50 mg by mouth at bedtime.   Yes [provider]  Vitamin D, Ergocalciferol, (DRISDOL) 50000 UNITS CAPS capsule Take 50,000 Units by mouth every 7 (seven) days. Once a month   Yes [provider]  dexamethasone (DECADRON) 4 MG tablet 5 TAB BY MOUTH ONCE A WEEK. 5 TAB WEEKLY THE DAY AFTER DARATUMUMAB X 8 WEEKS. TAKE WITH BREAKFAST. Patient not taking: Reported  on 06/26/2020 01/16/20   Lequita Asal, MD  ondansetron (ZOFRAN) 8 MG tablet Take 1 tablet (8 mg total) by mouth every 8 (eight) hours as needed (Nausea or vomiting). 03/13/20   Lequita Asal, MD    Family History Family History  Problem Relation Age of Onset  . Breast cancer Cousin        mat cousin    Social History Social History   Tobacco Use  . Smoking status: Never Smoker  . Smokeless tobacco: Never Used  Substance Use Topics  . Alcohol use: No  . Drug use: No     Allergies   Levofloxacin and Other   Review of Systems Review of Systems  HENT:       Fall, head injury   Neurological: Positive for dizziness. Negative for syncope and headaches.   Physical Exam Triage Vital Signs ED Triage Vitals  Enc Vitals Group     BP 07/02/20 1144 (!) 161/89     Pulse Rate 07/02/20 1144 (!) 106     Resp 07/02/20 1144 18     Temp 07/02/20 1144 98.2 F (36.8 C)     Temp Source 07/02/20 1144 Oral     SpO2 07/02/20 1144 99 %     Weight 07/02/20 1141 215 lb 2.7 oz (97.6 kg)     Height 07/02/20 1141 $RemoveBefor'5\' 5"'RtKobLRiDfJu$  (1.651 m)     Head Circumference --      Peak Flow --      Pain Score 07/02/20 1141 0     Pain Loc --      Pain Edu? --      Excl. in [place]? --    Updated Vital Signs BP (!) 161/89 (BP Location: Right Arm)   Pulse (!) 106   Temp 98.2 F (36.8 C) (Oral)   Resp 18   Ht $R'5\' 5"'Zu$  (1.651 m)   Wt 97.6 kg   SpO2 99%   BMI 35.81 kg/m   Visual Acuity Right Eye Distance:   Left Eye Distance:   Bilateral Distance:    Right Eye Near:   Left Eye Near:    Bilateral Near:     Physical Exam Constitutional:      General: She is not in acute distress.    Appearance: Normal appearance. She is not ill-appearing.  HENT:     Head: Normocephalic and atraumatic.     Comments: No palpable hematomas.  No step-offs. Eyes:     General:        Right eye: No discharge.        Left eye: No discharge.     Conjunctiva/sclera: Conjunctivae normal.  Cardiovascular:     Rate and  Rhythm: Regular rhythm. Tachycardia present.  Pulmonary:     Effort: Pulmonary effort is normal.     Breath sounds:  Normal breath sounds. No wheezing or rales.  Neurological:     General: No focal deficit present.     Mental Status: She is alert and oriented to person, place, and time.    UC Treatments / Results  Labs (all labs ordered are listed, but only abnormal results are displayed) Labs Reviewed - No data to display  EKG   Radiology CT Head Wo Contrast  Result Date: 07/02/2020 CLINICAL DATA:  Recent fall with headaches, initial encounter EXAM: CT HEAD WITHOUT CONTRAST TECHNIQUE: Contiguous axial images were obtained from the base of the skull through the vertex without intravenous contrast. COMPARISON:  02/26/2014 FINDINGS: Brain: No evidence of acute infarction, hemorrhage, hydrocephalus, extra-axial collection or mass lesion/mass effect. Vascular: No hyperdense vessel or unexpected calcification. Skull: No acute fracture is noted. Multiple lucent areas are identified consistent with the patient's given clinical history of multiple myeloma. Sinuses/Orbits: No acute finding. Other: None. IMPRESSION: No acute intracranial abnormality noted. Increased lucent lesions within the skull consistent with the given clinical history of myeloma. Electronically Signed   By: Inez Catalina M.D.   On: 07/02/2020 13:15    Procedures Procedures (including critical care time)  Medications Ordered in UC Medications - No data to display  Initial Impression / Assessment and Plan / UC Course  I have reviewed the triage vital signs and the nursing notes.  Pertinent labs & imaging results that were available during my care of the patient were reviewed by me and considered in my medical decision making (see chart for details).    75 year old female presents with head injury after suffering a fall.  Given advanced age and multiple myeloma with lytic lesions of the skull, CT was obtained.  CT negative  for acute intracranial abnormality.  Advised Tylenol and supportive care.  Final Clinical Impressions(s) / UC Diagnoses   Final diagnoses:  Minor head injury, initial encounter     Discharge Instructions     CT scan with no evidence of fracture/bleeding.  Tylenol as needed.  Take care  Dr. Lacinda Axon    ED Prescriptions    None     PDMP not reviewed this encounter.   Coral Spikes, [place] 07/02/20 1427

## 2020-07-02 NOTE — ED Notes (Signed)
CT prior auth approved #[id number] valid 07/02/20- 12/29/20

## 2020-07-02 NOTE — Telephone Encounter (Signed)
patient fell this morning - she hit her head --- was on carpet making sure dont need to get xray.Marland KitchenMarland Kitchen

## 2020-07-02 NOTE — ED Triage Notes (Addendum)
Pt states she fell this morning at her home. She states she tripped over an object in the floor. She hit her head on carpeted floor. She hit her head on the back of her head. Pt denies any headache, nausea, dizziness or vomiting. She has no pain to the touch in the area she hit her head. Pt states Dr. Trenton Founds wanted her to come have "x rays".

## 2020-07-10 ENCOUNTER — Other Ambulatory Visit: Payer: Self-pay

## 2020-07-10 DIAGNOSIS — C9000 Multiple myeloma not having achieved remission: Secondary | ICD-10-CM

## 2020-07-11 ENCOUNTER — Inpatient Hospital Stay: Payer: Medicare HMO | Attending: Hematology and Oncology

## 2020-07-11 ENCOUNTER — Other Ambulatory Visit: Payer: Self-pay

## 2020-07-11 DIAGNOSIS — R630 Anorexia: Secondary | ICD-10-CM | POA: Diagnosis not present

## 2020-07-11 DIAGNOSIS — E538 Deficiency of other specified B group vitamins: Secondary | ICD-10-CM | POA: Insufficient documentation

## 2020-07-11 DIAGNOSIS — R519 Headache, unspecified: Secondary | ICD-10-CM | POA: Insufficient documentation

## 2020-07-11 DIAGNOSIS — Z7951 Long term (current) use of inhaled steroids: Secondary | ICD-10-CM | POA: Diagnosis not present

## 2020-07-11 DIAGNOSIS — R61 Generalized hyperhidrosis: Secondary | ICD-10-CM | POA: Diagnosis not present

## 2020-07-11 DIAGNOSIS — Z90710 Acquired absence of both cervix and uterus: Secondary | ICD-10-CM | POA: Diagnosis not present

## 2020-07-11 DIAGNOSIS — Z7982 Long term (current) use of aspirin: Secondary | ICD-10-CM | POA: Insufficient documentation

## 2020-07-11 DIAGNOSIS — Z79899 Other long term (current) drug therapy: Secondary | ICD-10-CM | POA: Diagnosis not present

## 2020-07-11 DIAGNOSIS — R5381 Other malaise: Secondary | ICD-10-CM | POA: Insufficient documentation

## 2020-07-11 DIAGNOSIS — M545 Low back pain, unspecified: Secondary | ICD-10-CM | POA: Diagnosis not present

## 2020-07-11 DIAGNOSIS — Z7952 Long term (current) use of systemic steroids: Secondary | ICD-10-CM | POA: Diagnosis not present

## 2020-07-11 DIAGNOSIS — J45909 Unspecified asthma, uncomplicated: Secondary | ICD-10-CM | POA: Diagnosis not present

## 2020-07-11 DIAGNOSIS — Z5112 Encounter for antineoplastic immunotherapy: Secondary | ICD-10-CM | POA: Diagnosis present

## 2020-07-11 DIAGNOSIS — Z87820 Personal history of traumatic brain injury: Secondary | ICD-10-CM | POA: Diagnosis not present

## 2020-07-11 DIAGNOSIS — R5383 Other fatigue: Secondary | ICD-10-CM | POA: Insufficient documentation

## 2020-07-11 DIAGNOSIS — Z803 Family history of malignant neoplasm of breast: Secondary | ICD-10-CM | POA: Diagnosis not present

## 2020-07-11 DIAGNOSIS — C9000 Multiple myeloma not having achieved remission: Secondary | ICD-10-CM | POA: Insufficient documentation

## 2020-07-11 DIAGNOSIS — R2681 Unsteadiness on feet: Secondary | ICD-10-CM | POA: Diagnosis not present

## 2020-07-11 DIAGNOSIS — R200 Anesthesia of skin: Secondary | ICD-10-CM | POA: Diagnosis not present

## 2020-07-11 LAB — CBC WITH DIFFERENTIAL/PLATELET
Abs Immature Granulocytes: 0.02 10*3/uL (ref 0.00–0.07)
Basophils Absolute: 0.1 10*3/uL (ref 0.0–0.1)
Basophils Relative: 1 %
Eosinophils Absolute: 0.8 10*3/uL — ABNORMAL HIGH (ref 0.0–0.5)
Eosinophils Relative: 14 %
HCT: 42.5 % (ref 36.0–46.0)
Hemoglobin: 14.2 g/dL (ref 12.0–15.0)
Immature Granulocytes: 0 %
Lymphocytes Relative: 12 %
Lymphs Abs: 0.6 10*3/uL — ABNORMAL LOW (ref 0.7–4.0)
MCH: 30.5 pg (ref 26.0–34.0)
MCHC: 33.4 g/dL (ref 30.0–36.0)
MCV: 91.4 fL (ref 80.0–100.0)
Monocytes Absolute: 0.4 10*3/uL (ref 0.1–1.0)
Monocytes Relative: 7 %
Neutro Abs: 3.5 10*3/uL (ref 1.7–7.7)
Neutrophils Relative %: 66 %
Platelets: 189 10*3/uL (ref 150–400)
RBC: 4.65 MIL/uL (ref 3.87–5.11)
RDW: 13.6 % (ref 11.5–15.5)
WBC: 5.4 10*3/uL (ref 4.0–10.5)
nRBC: 0 % (ref 0.0–0.2)

## 2020-07-11 LAB — BASIC METABOLIC PANEL
Anion gap: 11 (ref 5–15)
BUN: 18 mg/dL (ref 8–23)
CO2: 26 mmol/L (ref 22–32)
Calcium: 9.2 mg/dL (ref 8.9–10.3)
Chloride: 102 mmol/L (ref 98–111)
Creatinine, Ser: 1.02 mg/dL — ABNORMAL HIGH (ref 0.44–1.00)
GFR, Estimated: 58 mL/min — ABNORMAL LOW (ref >60)
Glucose, Bld: 100 mg/dL — ABNORMAL HIGH (ref 70–99)
Potassium: 3.9 mmol/L (ref 3.5–5.1)
Sodium: 139 mmol/L (ref 135–145)

## 2020-07-15 ENCOUNTER — Inpatient Hospital Stay: Payer: Medicare HMO

## 2020-07-15 LAB — PROTEIN ELECTROPHORESIS, SERUM
A/G Ratio: 1.7 (ref 0.7–1.7)
Albumin ELP: 3.7 g/dL (ref 2.9–4.4)
Alpha-1-Globulin: 0.2 g/dL (ref 0.0–0.4)
Alpha-2-Globulin: 0.7 g/dL (ref 0.4–1.0)
Beta Globulin: 0.8 g/dL (ref 0.7–1.3)
Gamma Globulin: 0.4 g/dL (ref 0.4–1.8)
Globulin, Total: 2.2 g/dL (ref 2.2–3.9)
M-Spike, %: 0.2 g/dL — ABNORMAL HIGH
Total Protein ELP: 5.9 g/dL — ABNORMAL LOW (ref 6.0–8.5)

## 2020-07-17 ENCOUNTER — Other Ambulatory Visit: Payer: Self-pay | Admitting: Hematology and Oncology

## 2020-07-17 DIAGNOSIS — C9000 Multiple myeloma not having achieved remission: Secondary | ICD-10-CM

## 2020-07-19 ENCOUNTER — Encounter: Payer: Self-pay | Admitting: *Deleted

## 2020-07-19 ENCOUNTER — Other Ambulatory Visit: Payer: Self-pay | Admitting: *Deleted

## 2020-07-19 MED ORDER — LENALIDOMIDE 10 MG PO CAPS: 10.0000 mg | ORAL_CAPSULE | Freq: Every day | ORAL | 0 refills | Status: DC

## 2020-07-19 NOTE — Patient Instructions (Signed)
RX for Revlmid 10 mg #21 sent electronically to [facility]. [employer] AUTH# [id number]; Date Obtained 07/19/20.

## 2020-07-24 ENCOUNTER — Other Ambulatory Visit: Payer: Self-pay | Admitting: *Deleted

## 2020-07-24 DIAGNOSIS — C9000 Multiple myeloma not having achieved remission: Secondary | ICD-10-CM

## 2020-07-24 NOTE — Progress Notes (Signed)
[facility]  [address], [address] [place], [place] [address] Phone: [phone]  Fax: [phone]   Clinic Day:  07/25/2020  Referring physician: Denton Lank, MD  Chief Complaint: Laurie [name] is a 75 y.o. female with multiple myeloma who is seen for assessment prior to day 1 of cycle #9 daratumumab, Revlimid and Decadron.  HPI: The patient was last seen in the hematology clinic on 06/27/2020. At that time, she felt good. She had a few days of nausea and fatigue. She had sweats and on and off cough. She denied infections and bone pain.  Exam was stable. Hematocrit was 42.4, hemoglobin 14.0, platelets 322,000, WBC 5,400 with an ANC of 3200. Creatinine was 1.20 (CrCl 48 ml/min).  Calcium was 9.1.  M-spike was 0.2 gm/dL.  Marland Kitchen Kappa free light chains were 66.0, lambda free light chains 2.8, ratio 23.57.  She continued calcium 3000 mg and vitamin D. She received day 1 of cycle #8 daratumumab, Revlimid and Decadron. She received a vitamin B12 injection.  The patient went to [facility] on 07/02/2020 after falling and hitting her head. Head CT without contrast showed no acute intracranial abnormality. There were increased lucent lesions within the skull c/w the given clinical history of myeloma. She was prescribed Tylenol as needed.  Labs on 07/11/2020 revealed a hematocrit of 42.5, hemoglobin 14.2, platelets 189,000, WBC 5,400 ([place] 3500). Creatinine was 1.02 (CrCl 58 ml/min). M spike was 0.2 gm/dL. An additional M spike was observed at a concentration of 0.1 g/dl   During the interim, she has been "ok."  She feels like her brain is "cloudy"; she cannot concentrate. She has felt wobbly and unsteady on her feet and has been using a cane at all times.  She has a headache. These symptoms are new for her.  She uses her husband's walker around the house. She has some numbness in her feet but denies focal weakness. The patient started feeling a little bit better last  night.  Her asthma has been acting up due to the warmer weather. She is using her rescue inhaler. Her night sweats and cough are stable. She has not been nauseous. Her appetite is poor.  The patient takes Singulair everyday. She started Revlimid 10 mg yesterday.   Past Medical History:  Diagnosis Date  . Anxiety   . Asthma   . Depression   . Head injury   . Osteoporosis     Past Surgical History:  Procedure Laterality Date  . ABDOMINAL HYSTERECTOMY    . HEMORROIDECTOMY    . SEPTOPLASTY      Family History  Problem Relation Age of Onset  . Breast cancer Cousin        mat cousin    Social History:  reports that she has never smoked. She has never used smokeless tobacco. She reports that she does not drink alcohol and does not use drugs. She denies any exposure to radiation or toxins. She denies alcohol and tobacco use. Her husband has schizophrenia and committed suicide. The patient is retired and has not worked since 56. 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  She then worked in the [employer] x 5 years until disability at age 86.  She lives in [place], [place]. She has a Neurosurgeon named Phoebe.  She is a Sales promotion account executive [employer], and will not accept blood products.  The patient is alone today.  Allergies:  Allergies  Allergen Reactions  . Levofloxacin     Critical   . Other Other (See Comments)    Antihistamines    Current Medications: Current Outpatient Medications  Medication Sig Dispense Refill  . acyclovir (ZOVIRAX) 400 MG tablet Take 1 tablet (400 mg total) by mouth 2 (two) times daily. 60 tablet 11  . albuterol (VENTOLIN HFA) 108 (90 Base) MCG/ACT inhaler 2 inhalations every 4 (four) hours as needed.    Marland Kitchen alendronate (FOSAMAX) 70 MG tablet 70 mg once a week.    Marland Kitchen aspirin 81 MG chewable tablet Chew by mouth daily.    Marland Kitchen atorvastatin (LIPITOR) 20 MG tablet Take 20 mg by mouth daily.    . busPIRone (BUSPAR) 10 MG tablet  Take 10 mg by mouth 2 (two) times daily.    . Calcium Carb-Cholecalciferol (CALCIUM 600 + D PO) Take 600 mg by mouth 5 (five) times daily. Increase calcium from 637m 4 times a day to 6041m5 times a day starting today    . dexamethasone (DECADRON) 4 MG tablet 5 TAB BY MOUTH ONCE A WEEK. 5 TAB WEEKLY THE DAY AFTER DARATUMUMAB X 8 WEEKS. TAKE WITH BREAKFAST. 40 tablet 0  . docusate sodium (COLACE) 100 MG capsule Take 100 mg by mouth daily.    . fluticasone-salmeterol (ADVAIR HFA) 230-21 MCG/ACT inhaler Inhale into the lungs.    . Marland Kitchenenalidomide (REVLIMID) 10 MG capsule Take 1 capsule (10 mg total) by mouth daily. Three weeks on, 1 week off. 21 capsule 0  . montelukast (SINGULAIR) 10 MG tablet Take 10 mg by mouth daily after breakfast.    . naproxen (NAPROSYN) 500 MG tablet Take 500 mg by mouth 2 (two) times daily with a meal.    . nortriptyline (PAMELOR) 25 MG capsule Take by mouth daily. At night    . omeprazole (PRILOSEC) 20 MG capsule Take 20 mg by mouth daily.    . ondansetron (ZOFRAN) 8 MG tablet Take 1 tablet (8 mg total) by mouth every 8 (eight) hours as needed (Nausea or vomiting). 30 tablet 1  . sertraline (ZOLOFT) 100 MG tablet Take 100 mg by mouth 2 (two) times daily.    . traZODone (DESYREL) 100 MG tablet Take 50 mg by mouth at bedtime.    . Vitamin D, Ergocalciferol, (DRISDOL) 50000 UNITS CAPS capsule Take 50,000 Units by mouth every 7 (seven) days. Once a month     No current facility-administered medications for this visit.   Review of Systems  Constitutional: Positive for diaphoresis (occasional sweats, stable), malaise/fatigue and weight loss (5 lbs). Negative for chills and fever.       Feels "okay."  HENT: Negative for congestion, ear discharge, ear pain, hearing loss, nosebleeds, sinus pain, sore throat and tinnitus.   Eyes: Negative for blurred vision and double vision.  Respiratory: Positive for cough (on and off) and shortness of breath (asthma). Negative for hemoptysis and  sputum production.        Asthma  Cardiovascular: Negative for chest pain, palpitations and leg swelling.  Gastrointestinal: Negative for abdominal pain, blood in stool, constipation, diarrhea, heartburn (on medication), melena, nausea and vomiting.       Poor appetite  Genitourinary: Negative for dysuria, frequency, hematuria and urgency.  Musculoskeletal: Positive for back pain (lower). Negative for joint pain, myalgias and neck pain.  Skin: Negative for itching and rash.  Neurological: Positive for sensory change (foot numbness) and headaches. Negative for dizziness, tingling and weakness.       Occasional balance problems.  Traumatic brain injury (fall) 3 years ago. Unsteady on her feet, worse this week. Trouble concentrating.  Endo/Heme/Allergies: Does not bruise/bleed easily.  Psychiatric/Behavioral: Negative for depression and memory loss. The patient is not nervous/anxious and does not have insomnia.   All other systems reviewed and are negative.  Performance status (ECOG): 2  Vitals Blood pressure (!) 152/87, pulse 92, temperature (!) 95.6 F (35.3 C), temperature source Tympanic, resp. rate 18, weight 210 lb 8.6 oz (95.5 kg), SpO2 100 %.   Physical Exam Vitals and nursing note reviewed.  Constitutional:      General: She is not in acute distress.    Appearance: Normal appearance. She is well-developed.     Interventions: Face mask in place.     Comments: Cane by her side. Requires assistance onto exam table.  HENT:     Head: Normocephalic and atraumatic.     Comments: Short gray hair.    Mouth/Throat:     Mouth: Mucous membranes are moist. No oral lesions.     Pharynx: Oropharynx is clear.  Eyes:     Extraocular Movements: Extraocular movements intact.     Conjunctiva/sclera: Conjunctivae normal.     Pupils: Pupils are equal, round, and reactive to light.     Comments: Glasses. Blue eyes.  Cardiovascular:     Rate and Rhythm: Normal rate and regular rhythm.     Heart  sounds: Normal heart sounds. No murmur heard. No friction rub. No gallop.   Pulmonary:     Effort: Pulmonary effort is normal. No respiratory distress.     Breath sounds: Wheezing (scattered) present.  Chest:     Chest wall: No tenderness.  Breasts:     Right: No axillary adenopathy or supraclavicular adenopathy.     Left: No axillary adenopathy or supraclavicular adenopathy.    Abdominal:     General: Bowel sounds are normal. There is no distension.     Palpations: Abdomen is soft. There is no hepatomegaly, splenomegaly or mass.     Tenderness: There is no abdominal tenderness. There is no guarding or rebound.  Musculoskeletal:        General: No swelling or tenderness. Normal range of motion.     Cervical back: Normal range of motion and neck supple.  Lymphadenopathy:     Head:     Right side of head: No preauricular, posterior auricular or occipital adenopathy.     Left side of head: No preauricular, posterior auricular or occipital adenopathy.     Cervical: No cervical adenopathy.     Upper Body:     Right upper body: No supraclavicular or axillary adenopathy.     Left upper body: No supraclavicular or axillary adenopathy.     Lower Body: No right inguinal adenopathy. No left inguinal adenopathy.  Skin:    General: Skin is warm and dry.     Findings: No bruising, erythema, lesion or rash.  Neurological:     Mental Status: She is alert and oriented to person, place, and time.     Comments: Cranial nerves II-XII are intact. Upper and lower extremity strength 5/5. Sensation is normal and symmetric. RAM normal. Finger-to-nose test normal. Normal patellar reflexes.  Psychiatric:        Behavior: Behavior normal.        Thought Content: Thought content normal.        Judgment: Judgment normal.    Appointment on 07/25/2020  Component Date Value Ref Range Status  . Magnesium 07/25/2020 2.0  1.7 - 2.4 mg/dL Final   Performed at [facility], [address], [place], [place] [address]  . Sodium 07/25/2020 137  135 - 145 mmol/L Final  . Potassium 07/25/2020 3.8  3.5 - 5.1 mmol/L Final  . Chloride 07/25/2020 104  98 - 111 mmol/L Final  . CO2 07/25/2020 25  22 - 32 mmol/L Final  . Glucose, Bld 07/25/2020 89  70 - 99 mg/dL Final   Glucose reference range applies only to samples taken after fasting for at least 8 hours.  . BUN 07/25/2020 20  8 - 23 mg/dL Final  . Creatinine, Ser 07/25/2020 1.00  0.44 - 1.00 mg/dL Final  . Calcium 07/25/2020 8.8* 8.9 - 10.3 mg/dL Final  . Total Protein 07/25/2020 6.5  6.5 - 8.1 g/dL Final  . Albumin 07/25/2020 4.2  3.5 - 5.0 g/dL Final  . AST 07/25/2020 17  15 - 41 U/L Final  . ALT 07/25/2020 18  0 - 44 U/L Final  . Alkaline Phosphatase 07/25/2020 90  38 - 126 U/L Final  . Total Bilirubin 07/25/2020 0.8  0.3 - 1.2 mg/dL Final  . GFR, Estimated 07/25/2020 59* >60 mL/min Final   Comment: (NOTE) Calculated using the CKD-EPI Creatinine Equation (2021)   . Anion gap 07/25/2020 8  5 - 15 Final   Performed at [facility], [address] [place], [place] [address]  . WBC 07/25/2020 5.7  4.0 - 10.5 K/uL Final  . RBC 07/25/2020 4.76  3.87 - 5.11 MIL/uL Final  . Hemoglobin 07/25/2020 14.4  12.0 - 15.0 g/dL Final  . HCT 07/25/2020 42.8  36.0 - 46.0 % Final  . MCV 07/25/2020 89.9  80.0 - 100.0 fL Final  . MCH 07/25/2020 30.3  26.0 - 34.0 pg Final  . MCHC 07/25/2020 33.6  30.0 - 36.0 g/dL Final  . RDW 07/25/2020 13.7  11.5 - 15.5 % Final  . Platelets 07/25/2020 291  150 - 400 K/uL Final  . nRBC 07/25/2020 0.0  0.0 - 0.2 % Final  . Neutrophils Relative % 07/25/2020 61  % Final  . Neutro Abs 07/25/2020 3.5  1.7 - 7.7 K/uL Final  . Lymphocytes Relative 07/25/2020 18  % Final  . Lymphs Abs 07/25/2020 1.0  0.7 - 4.0 K/uL Final  . Monocytes Relative 07/25/2020 9  % Final  . Monocytes Absolute 07/25/2020 0.5  0.1 - 1.0 K/uL Final  . Eosinophils Relative 07/25/2020 9  % Final  . Eosinophils Absolute 07/25/2020 0.5   0.0 - 0.5 K/uL Final  . Basophils Relative 07/25/2020 2  % Final  . Basophils Absolute 07/25/2020 0.1  0.0 - 0.1 K/uL Final  . Immature Granulocytes 07/25/2020 1  % Final  . Abs Immature Granulocytes 07/25/2020 0.03  0.00 - 0.07 K/uL Final   Performed at [facility], [address], [place], [place] [address]    Assessment:  NARISSA BEAUFORT is a 75 y.o. female with stage III biclonal IgA multiple myeloma.  SPEP was 5.0 gm/dL on 10/03/2019. Random urine revealed 196.6 mg/dL total protein with 58.3% M-spike.   Bone marrow biopsy on 11/13/2019 revealed a hypercellular bone marrow with extensive involvement by plasma cell neoplasm. Atypical plasma cells represented 70% of all cells in the aspirate and was associated with prominent interstitial infiltrates and diffuse sheets in the clot.  Plasma cells were kappa light chain restricted.   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     Labs on 10/03/2019 revealed a hematocrit 27.9, hemoglobin 9.4, MCV 108, platelets 287,000, WBC 3,800 (ANC 2400).   Creatinine was 0.94, albumin 3.4, and protein 11.2.  Ferritin was 82 with an iron saturation of 30% and a TIBC of 206.  Hepatitis C antibody and HIV testing were negative.    Work-up on 10/26/2019 revealed a hematocrit was 27.4, hemoglobin 9.1, MCV 109.6, platelets 250,000, WBC 3,400 (ANC 2,100).  Retic was 2.2%.  Creatinine was 1.06 and calcium 8.6. Total protein was 11.4 and albumin 3.2.  M-spike was 5.7 gm/dL biclonal IgA protein with kappa specificity.  Kappa free light chain were 1,376.3, lambda free light chains <1.5, and ratio > 917.53 (0.26-1.65).  Beta 2 microglobulin was 10.3 (0.6 - 2.4). LDH was 102. TSH was 3.673.   Hepatitis serologies were negative on 11/28/2019.  24 hour UPEP on 10/30/2019 revealed 1020 mg/24 hours protein with 65.3% M-spike (665.9 mg/24 hours).  Urine free kappa light chain were 2,620.44, free lambda light chain 6.01 and ratio 436.01 (1.03-31.76).  Bone survey on 10/26/2019 revealed small lytic lesions in the skull. There was no acute fracture.  SPEP has been followed (gm/dL): 5.7 on 10/26/2019, 3.5 on 12/27/2019, 2.3 on 01/24/2020, 1.0 on 02/28/2020, 0.7 on 03/27/2020, 0.5 on 05/01/2020, 0.2 on 05/29/2020, and 0.2 on 06/27/2020. Kappa free light chains have been followed (mg/L): 1376.3 on 10/26/2019, 768 on 01/03/2020, 578.7 on 01/24/2020, 440.6 (ratio 220.30), 306.4 (ratio 127.67) on 03/27/2020, 154.7 (ratio 70.32) on 05/01/2020, 91.8 (ratio 36.72) on 05/29/2020, 66.0 (ratio 23.57) on 06/27/2020.  She is day 1 of cycle # 8 daratumumab SQ and Decadron (11/29/2019 - 06/27/2020).  She began monthly Xgeva on 12/05/2019 (last 06/27/2020).  She has B12 deficiency. Vitamin B12 was 200 (low) and folate >20.0 on 10/03/2019.  She was on weekly B12 injections and is now on monthly B12 injections (last 05/15/2020).  Antiparietal antibody and intrinsic factor antibodies were negative on 12/05/2019.  Chest CT without contrast on 03/27/2020 revealed numerous healed bilateral rib fractures. CXR abnormality was due to a healed rib fracture with callus formation.  She is a Restaurant manager, fast food.  She receives Retacrit (last 12/13/2019) and IV iron support.  Ferritin was 66 on 02/28/2020.  She received Venofer on 12/05/2019, 12/13/2019, and 01/17/2020.  She received both doses of the COVID-19 vaccine in 08/2019.  Symptomatically, she feels like her brain is "cloudy"; she cannot concentrate. She has felt wobbly and unsteady on her feet and has been using a cane at all times.  She has a headache. Symptoms are new.  She has some numbness in her feet but denies focal weakness. Neurologic exam is  non-focal.  Plan: 1.   Labs today: CBC with diff, CMP, Mg, SPEP, FLCA 2.   Stage III multiple myeloma Patient was diagnosed with a 5.7 gm/dL biclonal IgA gammopathy with kappa specificity.                         M-spike was 0.2 on 06/27/2020. Kappa free light chains were 1,376.3 with a ratio >917.53 (0.26-1.65) on 10/26/2019.                          Kappa free light chains were 66.0 on 06/27/2020.  Bone survey reveals lytic lesions in skull.  Recent head CT revealed lytic skull lesions. She receives [address] (cycle length 28 days). Daratumumab SQ 1800 mg on days 1 cycles #7 and beyond Revlimid 10 mg po day 1-21 (dose increased with cycle #7). Decadron 20 mg po days 1-2 (consider Decadron 20 mg weekly if tolerated). Prophylaxis: Singulair 10 mg po the day before, the day of and 2 days after daratumumab.    She takes Singulair daily secondary to allergies. Acyclovir 400 mg po BID. Aspirin 81 mg a day. Supportive care: She receives Xgeva monthly (last 06/27/2020).             Symptomatically, she appears to be doing well except for headache, difficulty concentrating, and unsteadiness on her feet.   Symptoms are new since her fall this month.  Labs reviewed.  Day 1 of cycle #9 daratumumab today.                          She is day 2 of Revlimid (10 mg a day 1 through 21 with each cycle).    May consider dose increase of Revlimid with future cycles.  Discuss symptom management.  She has antiemetics at home to use on a prn bases.  Interventions are adequate.   e.  3.   Lytic bone lesions  Calcium 8.8.  She continues calcium 3000 mg a day with vitamin D.   Xgeva today. 4.   B12 deficiency  Continue monthly vitamin D (last 07/25/2020).  B12  today.  Folate was > 20 on 10/03/2019.  Check folate annually. 5.   Hypocalcemia, resolved  Calcium 8.8.  Continue calcium 3000 mg a day.  Monitor closely while on Xgeva 6.   Neurologic symptoms  Patient has new symptoms of headache, difficulty concentrating and unsteadiness on her feet.  Etiology is unclear but occurred after fall this month with head trauma.  Head CT without contrast today to r/o subdural hematoma.  Assist with safe ambulation (rolling walker) and physical therapy consult. 7.   RN:  Rx:  rolling walker. 8.   RN:  PT consult. 9.   Cycle #9 daratumumab today. 10.   B12 today. 11.   Xgeva today. 12.   Head CT without contrast after treatment today. 13.   RTC in 4 weeks for MD assessment, labs (CBC with diff, CMP, Mg, SPEP, FLCA), B12, and day 1 of cycle #10 daratumumab.  I discussed the assessment and treatment plan with the patient.  The patient was provided an opportunity to ask questions and all were answered.  The patient agreed with the plan and demonstrated an understanding of the instructions.  The patient was advised to call back if the symptoms worsen or if the condition fails to improve as anticipated.  I provided 21 minutes of face-to-face time during this this encounter and > 50% was spent counseling as documented under my assessment and plan.  An additional 10 minutes were spent reviewing her chart (Epic and Care Everywhere) including notes, labs, and imaging studies.    Lequita Asal, MD, PhD 07/25/2020, 10:10 AM  I, Mirian Mo Tufford, am acting as Education administrator for [employer]. Mike Gip, MD, PhD.  I, Meric Joye C. Mike Gip, MD, have reviewed the above documentation for accuracy and completeness, and I agree with the above.

## 2020-07-25 ENCOUNTER — Inpatient Hospital Stay: Payer: Medicare HMO

## 2020-07-25 ENCOUNTER — Inpatient Hospital Stay (HOSPITAL_BASED_OUTPATIENT_CLINIC_OR_DEPARTMENT_OTHER): Payer: Medicare HMO | Admitting: Hematology and Oncology

## 2020-07-25 ENCOUNTER — Encounter: Payer: Self-pay | Admitting: Hematology and Oncology

## 2020-07-25 ENCOUNTER — Telehealth: Payer: Self-pay

## 2020-07-25 ENCOUNTER — Telehealth: Payer: Self-pay | Admitting: *Deleted

## 2020-07-25 ENCOUNTER — Other Ambulatory Visit: Payer: Self-pay

## 2020-07-25 VITALS — BP 152/87 | HR 92 | Temp 95.6°F | Resp 18 | Wt 210.5 lb

## 2020-07-25 DIAGNOSIS — D649 Anemia, unspecified: Secondary | ICD-10-CM

## 2020-07-25 DIAGNOSIS — S0990XD Unspecified injury of head, subsequent encounter: Secondary | ICD-10-CM

## 2020-07-25 DIAGNOSIS — M899 Disorder of bone, unspecified: Secondary | ICD-10-CM

## 2020-07-25 DIAGNOSIS — C9000 Multiple myeloma not having achieved remission: Secondary | ICD-10-CM

## 2020-07-25 DIAGNOSIS — E538 Deficiency of other specified B group vitamins: Secondary | ICD-10-CM

## 2020-07-25 DIAGNOSIS — Z5112 Encounter for antineoplastic immunotherapy: Secondary | ICD-10-CM | POA: Diagnosis not present

## 2020-07-25 LAB — COMPREHENSIVE METABOLIC PANEL
ALT: 18 U/L (ref 0–44)
AST: 17 U/L (ref 15–41)
Albumin: 4.2 g/dL (ref 3.5–5.0)
Alkaline Phosphatase: 90 U/L (ref 38–126)
Anion gap: 8 (ref 5–15)
BUN: 20 mg/dL (ref 8–23)
CO2: 25 mmol/L (ref 22–32)
Calcium: 8.8 mg/dL — ABNORMAL LOW (ref 8.9–10.3)
Chloride: 104 mmol/L (ref 98–111)
Creatinine, Ser: 1 mg/dL (ref 0.44–1.00)
GFR, Estimated: 59 mL/min — ABNORMAL LOW (ref >60)
Glucose, Bld: 89 mg/dL (ref 70–99)
Potassium: 3.8 mmol/L (ref 3.5–5.1)
Sodium: 137 mmol/L (ref 135–145)
Total Bilirubin: 0.8 mg/dL (ref 0.3–1.2)
Total Protein: 6.5 g/dL (ref 6.5–8.1)

## 2020-07-25 LAB — CBC WITH DIFFERENTIAL/PLATELET
Abs Immature Granulocytes: 0.03 10*3/uL (ref 0.00–0.07)
Basophils Absolute: 0.1 10*3/uL (ref 0.0–0.1)
Basophils Relative: 2 %
Eosinophils Absolute: 0.5 10*3/uL (ref 0.0–0.5)
Eosinophils Relative: 9 %
HCT: 42.8 % (ref 36.0–46.0)
Hemoglobin: 14.4 g/dL (ref 12.0–15.0)
Immature Granulocytes: 1 %
Lymphocytes Relative: 18 %
Lymphs Abs: 1 10*3/uL (ref 0.7–4.0)
MCH: 30.3 pg (ref 26.0–34.0)
MCHC: 33.6 g/dL (ref 30.0–36.0)
MCV: 89.9 fL (ref 80.0–100.0)
Monocytes Absolute: 0.5 10*3/uL (ref 0.1–1.0)
Monocytes Relative: 9 %
Neutro Abs: 3.5 10*3/uL (ref 1.7–7.7)
Neutrophils Relative %: 61 %
Platelets: 291 10*3/uL (ref 150–400)
RBC: 4.76 MIL/uL (ref 3.87–5.11)
RDW: 13.7 % (ref 11.5–15.5)
WBC: 5.7 10*3/uL (ref 4.0–10.5)
nRBC: 0 % (ref 0.0–0.2)

## 2020-07-25 LAB — MAGNESIUM: Magnesium: 2 mg/dL (ref 1.7–2.4)

## 2020-07-25 MED ORDER — ACETAMINOPHEN 325 MG PO TABS
650.0000 mg | ORAL_TABLET | Freq: Once | ORAL | Status: AC
  Administered 2020-07-25: 650 mg via ORAL
  Filled 2020-07-25: qty 2

## 2020-07-25 MED ORDER — DARATUMUMAB-HYALURONIDASE-FIHJ 1800-30000 MG-UT/15ML ~~LOC~~ SOLN
1800.0000 mg | Freq: Once | SUBCUTANEOUS | Status: AC
  Administered 2020-07-25: 1800 mg via SUBCUTANEOUS
  Filled 2020-07-25: qty 15

## 2020-07-25 MED ORDER — DENOSUMAB 120 MG/1.7ML ~~LOC~~ SOLN
120.0000 mg | Freq: Once | SUBCUTANEOUS | Status: AC
  Administered 2020-07-25: 120 mg via SUBCUTANEOUS
  Filled 2020-07-25: qty 1.7

## 2020-07-25 MED ORDER — DEXAMETHASONE 4 MG PO TABS
20.0000 mg | ORAL_TABLET | Freq: Once | ORAL | Status: AC
  Administered 2020-07-25: 20 mg via ORAL
  Filled 2020-07-25: qty 5

## 2020-07-25 MED ORDER — ONDANSETRON HCL 4 MG PO TABS
8.0000 mg | ORAL_TABLET | Freq: Once | ORAL | Status: AC
  Administered 2020-07-25: 8 mg via ORAL
  Filled 2020-07-25: qty 2

## 2020-07-25 MED ORDER — DIPHENHYDRAMINE HCL 25 MG PO CAPS
50.0000 mg | ORAL_CAPSULE | Freq: Once | ORAL | Status: AC
  Administered 2020-07-25: 50 mg via ORAL
  Filled 2020-07-25: qty 2

## 2020-07-25 MED ORDER — CYANOCOBALAMIN 1000 MCG/ML IJ SOLN
1000.0000 ug | Freq: Once | INTRAMUSCULAR | Status: AC
  Administered 2020-07-25: 1000 ug via INTRAMUSCULAR
  Filled 2020-07-25: qty 1

## 2020-07-25 NOTE — Telephone Encounter (Signed)
I called pt to let her know that I sent all the info needed to get walker for pt. I sent it to [facility] and they will contact you about the cost and if they deliver it. She says they called this evening and her insurance paid all of it but 11 dollars and they will deliver it tom. She also said that she forgot to ask about the system that you can push a button if you fall. I said that life alert and she said yes. I told her there is a company that works within [employer] that has something like that and that I will send her the info through my chart tom. When I get back to work. She is ok with that.

## 2020-07-25 NOTE — Progress Notes (Signed)
Patient here for oncology follow-up appointment, expresses concerns of constipation, decreased concentration and head pain

## 2020-07-26 LAB — KAPPA/LAMBDA LIGHT CHAINS
Kappa free light chain: 43.2 mg/L — ABNORMAL HIGH (ref 3.3–19.4)
Kappa, lambda light chain ratio: 18 — ABNORMAL HIGH (ref 0.26–1.65)
Lambda free light chains: 2.4 mg/L — ABNORMAL LOW (ref 5.7–26.3)

## 2020-07-28 ENCOUNTER — Encounter: Payer: Self-pay | Admitting: *Deleted

## 2020-07-29 ENCOUNTER — Telehealth: Payer: Self-pay | Admitting: Hematology and Oncology

## 2020-07-29 LAB — PROTEIN ELECTROPHORESIS, SERUM
A/G Ratio: 1.6 (ref 0.7–1.7)
Albumin ELP: 3.7 g/dL (ref 2.9–4.4)
Alpha-1-Globulin: 0.3 g/dL (ref 0.0–0.4)
Alpha-2-Globulin: 0.7 g/dL (ref 0.4–1.0)
Beta Globulin: 0.9 g/dL (ref 0.7–1.3)
Gamma Globulin: 0.4 g/dL (ref 0.4–1.8)
Globulin, Total: 2.3 g/dL (ref 2.2–3.9)
M-Spike, %: 0.2 g/dL — ABNORMAL HIGH
Total Protein ELP: 6 g/dL (ref 6.0–8.5)

## 2020-07-29 NOTE — Telephone Encounter (Signed)
07/29/2020 Spoke w/ pt and informed her that head CT has been authorized and scheduled for 3/29 @ 10:30. Pt confirmed appt  SRW

## 2020-07-30 ENCOUNTER — Ambulatory Visit
Admission: RE | Admit: 2020-07-30 | Discharge: 2020-07-30 | Disposition: A | Discharge status: Home / Self Care | Payer: Medicare HMO | Source: Ambulatory Visit | Attending: Hematology and Oncology | Admitting: Hematology and Oncology

## 2020-07-30 ENCOUNTER — Other Ambulatory Visit: Payer: Self-pay

## 2020-07-30 DIAGNOSIS — S0990XD Unspecified injury of head, subsequent encounter: Secondary | ICD-10-CM | POA: Diagnosis not present

## 2020-07-30 HISTORY — DX: Multiple myeloma not having achieved remission: C90.00

## 2020-08-12 ENCOUNTER — Inpatient Hospital Stay: Payer: Medicare HMO

## 2020-08-15 ENCOUNTER — Other Ambulatory Visit: Payer: Self-pay | Admitting: *Deleted

## 2020-08-16 NOTE — Telephone Encounter (Signed)
Pt is on her week off of revlimid right now. Will forward request to Dr. Mike Gip to get approval for refill. Pt is aware she will respond on Tuesday when she comes back. Dr. Mike Gip please advise on refill request. Ok to refill?

## 2020-08-20 ENCOUNTER — Other Ambulatory Visit: Payer: Self-pay | Admitting: Hematology and Oncology

## 2020-08-20 ENCOUNTER — Other Ambulatory Visit: Payer: Self-pay

## 2020-08-20 DIAGNOSIS — C9000 Multiple myeloma not having achieved remission: Secondary | ICD-10-CM

## 2020-08-20 MED ORDER — LENALIDOMIDE 10 MG PO CAPS: 10.0000 mg | ORAL_CAPSULE | Freq: Every day | ORAL | 0 refills | Status: DC

## 2020-08-20 NOTE — Progress Notes (Signed)
[facility]  [address], [address] [place], [place] [address] Phone: [phone]  Fax: [phone]   Clinic Day:  08/21/2020  Referring physician: Denton Lank, MD  Chief Complaint: Laurie [name] is a 75 y.o. female with multiple myeloma who is seen for assessment prior to day 1 of cycle #10 daratumumab, Revlimid and Decadron.  HPI: The patient was last seen in the hematology clinic on 07/25/2020. At that time, she felt like her brain was "cloudy"; she could not concentrate. She felt wobbly and unsteady on her feet and had been using a cane at all times.  She had a headache. She had some numbness in her feet but denied focal weakness. Neurologic exam was non-focal. Because of a fall, head CT was performed to r/o subdural hematoma. Head CT without contrast on 07/30/2020 revealed no acute intracranial pathology. There were multiple lytic lesions throughout the calvarium c/w patient's history of multiple myeloma.  Labs at last visit included a hematocrit of 42.8, hemoglobin 14.4, platelets 291,000, WBC 5,700. Calcium was 8.8. CrCl was 59 ml/min. M spike was 0.2 gm/dL. Kappa free light chains were 43.2, lambda free light chains 2.4, and ratio 18.00. She received a vitamin B12 injection and Xgeva. She received day 1 of cycle #9 daratumumab, Revlimid and Decadron.  During the interim, she has been "good." She still struggles with brain cloudiness and trouble concentrating, but she feels more alert today. She still feels unsteady on her feet, but she has a rolling walker now, which helps.  Her sweats, foot numbness, and shortness of breath have improved. Her appetite is poor because her stomach feels unsettled. If she does eat, she has to take ondansetron. Her back pain has resolved.  She is taking aspirin and acyclovir as prescribed. She took Singulair this morning. She takes calcium 3,000 mg daily. She is started Revlimid 10 mg today. She has not received it yet, but she  has 3 pills left over from last cycle.  She would like to put off PT at this time because her symptoms have improved.   Past Medical History:  Diagnosis Date  . Anxiety   . Asthma   . Depression   . Head injury   . Multiple myeloma ([place])   . Osteoporosis     Past Surgical History:  Procedure Laterality Date  . ABDOMINAL HYSTERECTOMY    . HEMORROIDECTOMY    . SEPTOPLASTY      Family History  Problem Relation Age of Onset  . Breast cancer Cousin        mat cousin    Social History:  reports that she has never smoked. She has never used smokeless tobacco. She reports that she does not drink alcohol and does not use drugs. She denies any exposure to radiation or toxins. She denies alcohol and tobacco use. Her husband has schizophrenia and committed suicide. The patient is retired and has not worked since 84. 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  She then worked in the [employer] x 5 years until disability at age 64.  She lives in [place], [place]. She has a Neurosurgeon named Phoebe.  She is a Sales promotion account executive [employer], and will not accept blood products.  The patient is alone today.  Allergies:  Allergies  Allergen Reactions  . Levofloxacin     Critical   . Other Other (See Comments)    Antihistamines    Current Medications: Current  Outpatient Medications  Medication Sig Dispense Refill  . acyclovir (ZOVIRAX) 400 MG tablet TAKE 1 TABLET BY MOUTH TWICE A DAY 180 tablet 3  . albuterol (VENTOLIN HFA) 108 (90 Base) MCG/ACT inhaler 2 inhalations every 4 (four) hours as needed.    Marland Kitchen alendronate (FOSAMAX) 70 MG tablet 70 mg once a week.    Marland Kitchen aspirin 81 MG chewable tablet Chew by mouth daily.    Marland Kitchen atorvastatin (LIPITOR) 20 MG tablet Take 20 mg by mouth daily.    . busPIRone (BUSPAR) 10 MG tablet Take 10 mg by mouth 2 (two) times daily.    . Calcium Carb-Cholecalciferol (CALCIUM 600 + D PO) Take 600 mg by mouth 5 (five) times daily.  Increase calcium from 611m 4 times a day to 6035m5 times a day starting today    . dexamethasone (DECADRON) 4 MG tablet 5 TAB BY MOUTH ONCE A WEEK. 5 TAB WEEKLY THE DAY AFTER DARATUMUMAB X 8 WEEKS. TAKE WITH BREAKFAST. 40 tablet 0  . docusate sodium (COLACE) 100 MG capsule Take 100 mg by mouth daily.    . fluticasone-salmeterol (ADVAIR HFA) 230-21 MCG/ACT inhaler Inhale into the lungs.    . montelukast (SINGULAIR) 10 MG tablet Take 10 mg by mouth daily after breakfast.    . naproxen (NAPROSYN) 500 MG tablet Take 500 mg by mouth 2 (two) times daily with a meal.    . nortriptyline (PAMELOR) 25 MG capsule Take by mouth daily. At night    . omeprazole (PRILOSEC) 20 MG capsule Take 20 mg by mouth daily.    . ondansetron (ZOFRAN) 8 MG tablet Take 1 tablet (8 mg total) by mouth every 8 (eight) hours as needed (Nausea or vomiting). 30 tablet 1  . sertraline (ZOLOFT) 100 MG tablet Take 100 mg by mouth 2 (two) times daily.    . traZODone (DESYREL) 100 MG tablet Take 50 mg by mouth at bedtime.    . Vitamin D, Ergocalciferol, (DRISDOL) 50000 UNITS CAPS capsule Take 50,000 Units by mouth every 7 (seven) days. Once a month    . lenalidomide (REVLIMID) 10 MG capsule Take 1 capsule (10 mg total) by mouth daily. Three weeks on, 1 week off. 21 capsule 0   No current facility-administered medications for this visit.    Review of Systems  Constitutional: Positive for diaphoresis (occasional sweats, improved). Negative for chills, fever, malaise/fatigue and weight loss (stable).  HENT: Negative for congestion, ear discharge, ear pain, hearing loss, nosebleeds, sinus pain, sore throat and tinnitus.   Eyes: Negative for blurred vision and double vision.  Respiratory: Positive for shortness of breath (asthma, improved). Negative for cough, hemoptysis and sputum production.   Cardiovascular: Negative for chest pain, palpitations and leg swelling.  Gastrointestinal: Negative for abdominal pain, blood in stool,  constipation, diarrhea, heartburn (on medication), melena, nausea and vomiting.       Poor appetite.  Genitourinary: Negative for dysuria, frequency, hematuria and urgency.  Musculoskeletal: Negative for back pain, joint pain, myalgias and neck pain.  Skin: Negative for itching and rash.  Neurological: Positive for sensory change (foot numbness, improved). Negative for dizziness, tingling, weakness and headaches.       Occasional balance problems. Traumatic brain injury (fall) 3 years ago. Unsteady on her feet. Trouble concentrating.  Endo/Heme/Allergies: Does not bruise/bleed easily.  Psychiatric/Behavioral: Negative for depression and memory loss. The patient is not nervous/anxious and does not have insomnia.   All other systems reviewed and are negative.  Performance status (ECOG): 1  Vitals  Blood pressure (!) 150/85, pulse 88, temperature (!) 96.3 F (35.7 C), resp. rate 18, weight 210 lb 8 oz (95.5 kg), SpO2 96 %.   Physical Exam Vitals and nursing note reviewed.  Constitutional:      General: She is not in acute distress.    Appearance: Normal appearance. She is well-developed.     Interventions: Face mask in place.     Comments: Rolling walker by her side. Requires assistance onto exam table.  HENT:     Head: Normocephalic and atraumatic.     Comments: Short gray hair.    Mouth/Throat:     Mouth: Mucous membranes are moist. No oral lesions.     Pharynx: Oropharynx is clear.  Eyes:     Extraocular Movements: Extraocular movements intact.     Conjunctiva/sclera: Conjunctivae normal.     Pupils: Pupils are equal, round, and reactive to light.     Comments: Glasses. Blue eyes.  Cardiovascular:     Rate and Rhythm: Normal rate and regular rhythm.     Heart sounds: Normal heart sounds. No murmur heard. No friction rub. No gallop.   Pulmonary:     Effort: Pulmonary effort is normal. No respiratory distress.     Breath sounds: Normal breath sounds. No wheezing or rales.   Chest:     Chest wall: No tenderness.  Breasts:     Right: No axillary adenopathy or supraclavicular adenopathy.     Left: No axillary adenopathy or supraclavicular adenopathy.    Abdominal:     General: Bowel sounds are normal. There is no distension.     Palpations: Abdomen is soft. There is no hepatomegaly, splenomegaly or mass.     Tenderness: There is no abdominal tenderness. There is no guarding or rebound.  Musculoskeletal:        General: No swelling or tenderness. Normal range of motion.     Cervical back: Normal range of motion and neck supple.  Lymphadenopathy:     Head:     Right side of head: No preauricular, posterior auricular or occipital adenopathy.     Left side of head: No preauricular, posterior auricular or occipital adenopathy.     Cervical: No cervical adenopathy.     Upper Body:     Right upper body: No supraclavicular or axillary adenopathy.     Left upper body: No supraclavicular or axillary adenopathy.     Lower Body: No right inguinal adenopathy. No left inguinal adenopathy.  Skin:    General: Skin is warm and dry.     Findings: No bruising, erythema, lesion or rash.  Neurological:     Mental Status: She is alert and oriented to person, place, and time.  Psychiatric:        Behavior: Behavior normal.        Thought Content: Thought content normal.        Judgment: Judgment normal.    Appointment on 08/21/2020  Component Date Value Ref Range Status  . Sodium 08/21/2020 137  135 - 145 mmol/L Final  . Potassium 08/21/2020 4.0  3.5 - 5.1 mmol/L Final  . Chloride 08/21/2020 104  98 - 111 mmol/L Final  . CO2 08/21/2020 24  22 - 32 mmol/L Final  . Glucose, Bld 08/21/2020 105* 70 - 99 mg/dL Final   Glucose reference range applies only to samples taken after fasting for at least 8 hours.  . BUN 08/21/2020 18  8 - 23 mg/dL Final  . Creatinine, Ser 08/21/2020 1.09*  0.44 - 1.00 mg/dL Final  . Calcium 08/21/2020 9.0  8.9 - 10.3 mg/dL Final  . Total  Protein 08/21/2020 6.2* 6.5 - 8.1 g/dL Final  . Albumin 08/21/2020 4.1  3.5 - 5.0 g/dL Final  . AST 08/21/2020 15  15 - 41 U/L Final  . ALT 08/21/2020 17  0 - 44 U/L Final  . Alkaline Phosphatase 08/21/2020 87  38 - 126 U/L Final  . Total Bilirubin 08/21/2020 0.8  0.3 - 1.2 mg/dL Final  . GFR, Estimated 08/21/2020 54* >60 mL/min Final   Comment: (NOTE) Calculated using the CKD-EPI Creatinine Equation (2021)   . Anion gap 08/21/2020 9  5 - 15 Final   Performed at [facility], [address], [place], [place] [address]  . Magnesium 08/21/2020 2.0  1.7 - 2.4 mg/dL Final   Performed at [facility], [address] [place], [place] [address]  . WBC 08/21/2020 5.6  4.0 - 10.5 K/uL Final  . RBC 08/21/2020 4.52  3.87 - 5.11 MIL/uL Final  . Hemoglobin 08/21/2020 13.8  12.0 - 15.0 g/dL Final  . HCT 08/21/2020 40.7  36.0 - 46.0 % Final  . MCV 08/21/2020 90.0  80.0 - 100.0 fL Final  . MCH 08/21/2020 30.5  26.0 - 34.0 pg Final  . MCHC 08/21/2020 33.9  30.0 - 36.0 g/dL Final  . RDW 08/21/2020 14.1  11.5 - 15.5 % Final  . Platelets 08/21/2020 347  150 - 400 K/uL Final  . nRBC 08/21/2020 0.0  0.0 - 0.2 % Final  . Neutrophils Relative % 08/21/2020 68  % Final  . Neutro Abs 08/21/2020 3.8  1.7 - 7.7 K/uL Final  . Lymphocytes Relative 08/21/2020 16  % Final  . Lymphs Abs 08/21/2020 0.9  0.7 - 4.0 K/uL Final  . Monocytes Relative 08/21/2020 9  % Final  . Monocytes Absolute 08/21/2020 0.5  0.1 - 1.0 K/uL Final  . Eosinophils Relative 08/21/2020 5  % Final  . Eosinophils Absolute 08/21/2020 0.3  0.0 - 0.5 K/uL Final  . Basophils Relative 08/21/2020 1  % Final  . Basophils Absolute 08/21/2020 0.1  0.0 - 0.1 K/uL Final  . Immature Granulocytes 08/21/2020 1  % Final  . Abs Immature Granulocytes 08/21/2020 0.03  0.00 - 0.07 K/uL Final   Performed at [facility], [address] [place], [place] [address]    Assessment:  Laurie [name] is a 75 y.o. female with  stage III biclonal IgA multiple myeloma.  SPEP was 5.0 gm/dL on 10/03/2019. Random urine revealed 196.6 mg/dL total protein with 58.3% M-spike.   Bone marrow biopsy on 11/13/2019 revealed a hypercellular bone marrow with extensive involvement by plasma cell neoplasm. Atypical plasma cells represented 70% of all cells in the aspirate and was associated with prominent interstitial infiltrates and diffuse sheets in the clot.  Plasma cells were kappa light chain restricted.   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     Labs on 10/03/2019 revealed a hematocrit 27.9, hemoglobin 9.4, MCV 108, platelets 287,000, WBC 3,800 (ANC 2400).   Creatinine was 0.94, albumin 3.4, and protein 11.2.  Ferritin was 82 with an iron saturation of 30% and a TIBC of 206.  Hepatitis C antibody  and HIV testing were negative.    Work-up on 10/26/2019 revealed a hematocrit was 27.4, hemoglobin 9.1, MCV 109.6, platelets 250,000, WBC 3,400 (ANC 2,100).  Retic was 2.2%.  Creatinine was 1.06 and calcium 8.6. Total protein was 11.4 and albumin 3.2.  M-spike was 5.7 gm/dL biclonal IgA protein with kappa specificity.  Kappa free light chain were 1,376.3, lambda free light chains <1.5, and ratio > 917.53 (0.26-1.65).  Beta 2 microglobulin was 10.3 (0.6 - 2.4). LDH was 102. TSH was 3.673.  Hepatitis serologies were negative on 11/28/2019.  24 hour UPEP on 10/30/2019 revealed 1020 mg/24 hours protein with 65.3% M-spike (665.9 mg/24 hours).  Urine free kappa light chain were 2,620.44, free lambda light chain 6.01 and ratio 436.01 (1.03-31.76).  Bone survey on 10/26/2019 revealed small lytic lesions in the skull. There was no acute fracture.  SPEP has been followed  (gm/dL): 5.7 on 10/26/2019, 3.5 on 12/27/2019, 2.3 on 01/24/2020, 1.0 on 02/28/2020, 0.7 on 03/27/2020, 0.5 on 05/01/2020, 0.2 on 05/29/2020, 0.2 on 06/27/2020, and 0.1 on 08/21/2020. Kappa free light chains have been followed (mg/L): 1376.3 on 10/26/2019, 768 on 01/03/2020, 578.7 on 01/24/2020, 440.6 (ratio 220.30), 306.4 (ratio 127.67) on 03/27/2020, 154.7 (ratio 70.32) on 05/01/2020, 91.8 (ratio 36.72) on 05/29/2020, 66.0 (ratio 23.57) on 06/27/2020, 43.2 (ratio 18.0) on 07/25/2020, and 29.7 (ratio 6.6) on 08/21/2020.  She is s/p cycle # 9 daratumumab SQ and Decadron (11/29/2019 - 07/25/2020).  She began monthly Xgeva on 12/05/2019 (last 07/25/2020).  She has B12 deficiency. Vitamin B12 was 200 (low) and folate >20.0 on 10/03/2019.  She was on weekly B12 injections and is now on monthly B12 injections (last 07/25/2020).  Antiparietal antibody and intrinsic factor antibodies were negative on 12/05/2019.  Chest CT without contrast on 03/27/2020 revealed numerous healed bilateral rib fractures. CXR abnormality was due to a healed rib fracture with callus formation.  She is a Restaurant manager, fast food.  She receives Retacrit (last 12/13/2019) and IV iron support.  Ferritin was 66 on 02/28/2020.  She received Venofer on 12/05/2019, 12/13/2019, and 01/17/2020.  She received both doses of the COVID-19 vaccine in 08/2019.  Symptomatically, she still struggles with brain cloudiness and trouble concentrating, however she feels more alert today. She still feels unsteady on her feet and is using a rolling walker.  Her sweats, foot numbness, and shortness of breath have improved. Her appetite is poor because her stomach feels unsettled.  Back pain has resolved.  Plan: 1.   Labs today: CBC with diff, CMP, Mg, SPEP, FLCA 2.   Stage III multiple myeloma Patient was diagnosed with a 5.7 gm/dL biclonal IgA gammopathy with kappa specificity.                         M-spike is 0.1 today. Kappa  free light chains were 1,376.3 with a ratio >917.53 (0.26-1.65) on 10/26/2019.                          Kappa free light chains are 29.7 (ratio 6.6) today. Bone survey reveals lytic lesions in skull.  Recent head CT revealed lytic skull lesions. She receives [address] (cycle length 28 days). Daratumumab SQ 1800 mg on days 1 cycles #7 and beyond Revlimid 10 mg po day 1-21 (dose increased with cycle #7). Decadron 20 mg po days 1-2 (consider Decadron 20 mg weekly if tolerated). Prophylaxis: Singulair 10 mg po the day before, the day of and 2 days  after daratumumab.    She takes Singulair daily secondary to allergies. Acyclovir 400 mg po BID. Aspirin 81 mg a day. Supportive care: She receives Xgeva monthly (last 07/25/2020).             Symptomatically, she has improved since last visit.   Discuss plan to continue current treatment.  Labs reviewed.  Day 1 of cycle #10 daratumumab today.                         Begin Revlimid 10 mg a day today (3 weeks on/1 week off).   May consider dose increase of Revlimid with future cycles depending on counts.  Discuss symptom management.  She has antiemetics at home to use on a prn bases.  Interventions are adequate.    3.   Lytic bone lesions  Calcium 9.0.  She continues calcium 3000 mg a day with vitamin D.   Xgeva today. 4.   B12 deficiency  Continue monthly vitamin D (last 07/25/2020).  B12 today.  Folate was > 20 on 10/03/2019.  Check folate annually. 5.   Hypocalcemia, resolved  Calcium 9.0.  Continue calcium 3000 mg a day.  Continue to monitor closely on Xgeva. 6.   Neurologic symptoms  Patient noted symptoms of headache, difficulty concentrating and unsteadiness on her feet at last visit.  She has a history of  occasional balance problems s/p traumatic brain injury (fall) 3 years ago.  Symptoms have improved since last visit.  No current evidence of Revlimid toxicity.  Head CT without contrast on 07/30/2020 r/o a subdural hematoma (performed secondary to a fall).  Patient doing well with a rolling walker; she declines physical therapy consult today. 7.   B12 today. 8.   Xgeva today. 9.   Day 1 of cycle #10 daratumumab 10.   RTC in 4 weeks for MD assessment, labs (CBC with diff, CMP, Mg, SPEP, FLCA), B12, Xgeva, and day 1 of cycle #11 daratumumab.  I discussed the assessment and treatment plan with the patient.  The patient was provided an opportunity to ask questions and all were answered.  The patient agreed with the plan and demonstrated an understanding of the instructions.  The patient was advised to call back if the symptoms worsen or if the condition fails to improve as anticipated.   Lequita Asal, MD, PhD 08/21/2020, 9:53 AM  I, Mirian Mo Tufford, am acting as Education administrator for [employer]. Mike Gip, MD, PhD.  I, Jiah Bari C. Mike Gip, MD, have reviewed the above documentation for accuracy and completeness, and I agree with the above.

## 2020-08-21 ENCOUNTER — Inpatient Hospital Stay: Payer: Medicare HMO

## 2020-08-21 ENCOUNTER — Inpatient Hospital Stay: Payer: Medicare HMO | Attending: Hematology and Oncology

## 2020-08-21 ENCOUNTER — Other Ambulatory Visit: Payer: Self-pay

## 2020-08-21 ENCOUNTER — Encounter: Payer: Self-pay | Admitting: Hematology and Oncology

## 2020-08-21 ENCOUNTER — Inpatient Hospital Stay (HOSPITAL_BASED_OUTPATIENT_CLINIC_OR_DEPARTMENT_OTHER): Payer: Medicare HMO | Admitting: Hematology and Oncology

## 2020-08-21 VITALS — BP 150/85 | HR 88 | Temp 96.3°F | Resp 18 | Wt 210.5 lb

## 2020-08-21 DIAGNOSIS — C9000 Multiple myeloma not having achieved remission: Secondary | ICD-10-CM | POA: Diagnosis not present

## 2020-08-21 DIAGNOSIS — Z7982 Long term (current) use of aspirin: Secondary | ICD-10-CM | POA: Insufficient documentation

## 2020-08-21 DIAGNOSIS — Z90710 Acquired absence of both cervix and uterus: Secondary | ICD-10-CM | POA: Diagnosis not present

## 2020-08-21 DIAGNOSIS — Z79899 Other long term (current) drug therapy: Secondary | ICD-10-CM | POA: Diagnosis not present

## 2020-08-21 DIAGNOSIS — M899 Disorder of bone, unspecified: Secondary | ICD-10-CM | POA: Diagnosis not present

## 2020-08-21 DIAGNOSIS — E538 Deficiency of other specified B group vitamins: Secondary | ICD-10-CM | POA: Diagnosis not present

## 2020-08-21 DIAGNOSIS — Z5112 Encounter for antineoplastic immunotherapy: Secondary | ICD-10-CM | POA: Diagnosis not present

## 2020-08-21 DIAGNOSIS — D649 Anemia, unspecified: Secondary | ICD-10-CM

## 2020-08-21 DIAGNOSIS — S0990XD Unspecified injury of head, subsequent encounter: Secondary | ICD-10-CM

## 2020-08-21 LAB — CBC WITH DIFFERENTIAL/PLATELET
Abs Immature Granulocytes: 0.03 10*3/uL (ref 0.00–0.07)
Basophils Absolute: 0.1 10*3/uL (ref 0.0–0.1)
Basophils Relative: 1 %
Eosinophils Absolute: 0.3 10*3/uL (ref 0.0–0.5)
Eosinophils Relative: 5 %
HCT: 40.7 % (ref 36.0–46.0)
Hemoglobin: 13.8 g/dL (ref 12.0–15.0)
Immature Granulocytes: 1 %
Lymphocytes Relative: 16 %
Lymphs Abs: 0.9 10*3/uL (ref 0.7–4.0)
MCH: 30.5 pg (ref 26.0–34.0)
MCHC: 33.9 g/dL (ref 30.0–36.0)
MCV: 90 fL (ref 80.0–100.0)
Monocytes Absolute: 0.5 10*3/uL (ref 0.1–1.0)
Monocytes Relative: 9 %
Neutro Abs: 3.8 10*3/uL (ref 1.7–7.7)
Neutrophils Relative %: 68 %
Platelets: 347 10*3/uL (ref 150–400)
RBC: 4.52 MIL/uL (ref 3.87–5.11)
RDW: 14.1 % (ref 11.5–15.5)
WBC: 5.6 10*3/uL (ref 4.0–10.5)
nRBC: 0 % (ref 0.0–0.2)

## 2020-08-21 LAB — COMPREHENSIVE METABOLIC PANEL
ALT: 17 U/L (ref 0–44)
AST: 15 U/L (ref 15–41)
Albumin: 4.1 g/dL (ref 3.5–5.0)
Alkaline Phosphatase: 87 U/L (ref 38–126)
Anion gap: 9 (ref 5–15)
BUN: 18 mg/dL (ref 8–23)
CO2: 24 mmol/L (ref 22–32)
Calcium: 9 mg/dL (ref 8.9–10.3)
Chloride: 104 mmol/L (ref 98–111)
Creatinine, Ser: 1.09 mg/dL — ABNORMAL HIGH (ref 0.44–1.00)
GFR, Estimated: 54 mL/min — ABNORMAL LOW (ref >60)
Glucose, Bld: 105 mg/dL — ABNORMAL HIGH (ref 70–99)
Potassium: 4 mmol/L (ref 3.5–5.1)
Sodium: 137 mmol/L (ref 135–145)
Total Bilirubin: 0.8 mg/dL (ref 0.3–1.2)
Total Protein: 6.2 g/dL — ABNORMAL LOW (ref 6.5–8.1)

## 2020-08-21 LAB — MAGNESIUM: Magnesium: 2 mg/dL (ref 1.7–2.4)

## 2020-08-21 MED ORDER — CYANOCOBALAMIN 1000 MCG/ML IJ SOLN
1000.0000 ug | Freq: Once | INTRAMUSCULAR | Status: AC
  Administered 2020-08-21: 1000 ug via INTRAMUSCULAR
  Filled 2020-08-21: qty 1

## 2020-08-21 MED ORDER — LENALIDOMIDE 10 MG PO CAPS: 10.0000 mg | ORAL_CAPSULE | Freq: Every day | ORAL | 0 refills | Status: DC

## 2020-08-21 MED ORDER — DIPHENHYDRAMINE HCL 25 MG PO CAPS
50.0000 mg | ORAL_CAPSULE | Freq: Once | ORAL | Status: AC
Start: 2020-08-21 — End: 2020-08-21
  Administered 2020-08-21: 50 mg via ORAL
  Filled 2020-08-21: qty 2

## 2020-08-21 MED ORDER — ONDANSETRON HCL 4 MG PO TABS
8.0000 mg | ORAL_TABLET | Freq: Once | ORAL | Status: AC
  Administered 2020-08-21: 8 mg via ORAL
  Filled 2020-08-21: qty 2

## 2020-08-21 MED ORDER — DENOSUMAB 120 MG/1.7ML ~~LOC~~ SOLN
120.0000 mg | Freq: Once | SUBCUTANEOUS | Status: AC
  Administered 2020-08-21: 120 mg via SUBCUTANEOUS
  Filled 2020-08-21: qty 1.7

## 2020-08-21 MED ORDER — DEXAMETHASONE 4 MG PO TABS
20.0000 mg | ORAL_TABLET | Freq: Once | ORAL | Status: AC
  Administered 2020-08-21: 20 mg via ORAL

## 2020-08-21 MED ORDER — DARATUMUMAB-HYALURONIDASE-FIHJ 1800-30000 MG-UT/15ML ~~LOC~~ SOLN
1800.0000 mg | Freq: Once | SUBCUTANEOUS | Status: AC
  Administered 2020-08-21: 1800 mg via SUBCUTANEOUS
  Filled 2020-08-21: qty 15

## 2020-08-21 MED ORDER — ACETAMINOPHEN 325 MG PO TABS
650.0000 mg | ORAL_TABLET | Freq: Once | ORAL | Status: AC
Start: 2020-08-21 — End: 2020-08-21
  Administered 2020-08-21: 650 mg via ORAL
  Filled 2020-08-21: qty 2

## 2020-08-21 NOTE — Progress Notes (Signed)
Patient feels her appetite has decreased, no wt loss since last visit.  [facility] (I have changed the pharmacy to [facility] and refill request is "pending").  Patient would like to discuss getting B12 level checked.

## 2020-08-22 ENCOUNTER — Ambulatory Visit: Payer: Medicare HMO

## 2020-08-22 ENCOUNTER — Ambulatory Visit: Payer: Medicare HMO | Admitting: Hematology and Oncology

## 2020-08-22 ENCOUNTER — Other Ambulatory Visit: Payer: Medicare HMO

## 2020-08-22 ENCOUNTER — Telehealth: Payer: Self-pay | Admitting: Pharmacy Technician

## 2020-08-22 LAB — KAPPA/LAMBDA LIGHT CHAINS
Kappa free light chain: 29.7 mg/L — ABNORMAL HIGH (ref 3.3–19.4)
Kappa, lambda light chain ratio: 6.6 — ABNORMAL HIGH (ref 0.26–1.65)
Lambda free light chains: 4.5 mg/L — ABNORMAL LOW (ref 5.7–26.3)

## 2020-08-22 NOTE — Telephone Encounter (Signed)
Oral Oncology Patient Advocate Encounter  Received notification from [facility] that prior authorization for Revlimid is required.  PA submitted on CoverMyMeds Key [id number] Status is pending  Oral Oncology Clinic will continue to follow.  [place] Patient [place] Phone [phone] Fax [phone] 08/22/2020 12:49 PM

## 2020-08-22 NOTE — Telephone Encounter (Signed)
Oral Oncology Patient Advocate Encounter  Prior Authorization for Revlimid has been approved.    PA# [id number] Effective dates: 05/04/20 through 05/03/21  Oral Oncology Clinic will continue to follow.   [place] Patient [place] Phone [phone] Fax [phone] 08/22/2020 3:56 PM

## 2020-08-23 LAB — PROTEIN ELECTROPHORESIS, SERUM
A/G Ratio: 1.5 (ref 0.7–1.7)
Albumin ELP: 3.5 g/dL (ref 2.9–4.4)
Alpha-1-Globulin: 0.2 g/dL (ref 0.0–0.4)
Alpha-2-Globulin: 0.7 g/dL (ref 0.4–1.0)
Beta Globulin: 0.9 g/dL (ref 0.7–1.3)
Gamma Globulin: 0.4 g/dL (ref 0.4–1.8)
Globulin, Total: 2.3 g/dL (ref 2.2–3.9)
M-Spike, %: 0.1 g/dL — ABNORMAL HIGH
Total Protein ELP: 5.8 g/dL — ABNORMAL LOW (ref 6.0–8.5)

## 2020-08-26 ENCOUNTER — Ambulatory Visit: Payer: Medicare HMO

## 2020-09-09 ENCOUNTER — Inpatient Hospital Stay: Payer: Medicare HMO

## 2020-09-12 ENCOUNTER — Other Ambulatory Visit: Payer: Self-pay | Admitting: *Deleted

## 2020-09-12 ENCOUNTER — Other Ambulatory Visit: Payer: Self-pay

## 2020-09-12 ENCOUNTER — Telehealth: Payer: Self-pay | Admitting: *Deleted

## 2020-09-12 MED ORDER — LENALIDOMIDE 10 MG PO CAPS: 10.0000 mg | ORAL_CAPSULE | Freq: Every day | ORAL | 0 refills | Status: DC

## 2020-09-12 NOTE — Telephone Encounter (Signed)
Received incoming call from [facility] at [facility]. Pt needs stat rf on Revlimid. RN Reviewed chart. revlkimid script was renewed but the script was printed instead of escribed. RN resubmitted RX to [facility] electronically.

## 2020-09-13 ENCOUNTER — Other Ambulatory Visit: Payer: Self-pay

## 2020-09-13 ENCOUNTER — Other Ambulatory Visit: Payer: Self-pay | Admitting: *Deleted

## 2020-09-13 DIAGNOSIS — C9000 Multiple myeloma not having achieved remission: Secondary | ICD-10-CM

## 2020-09-13 MED ORDER — LENALIDOMIDE 10 MG PO CAPS: 10.0000 mg | ORAL_CAPSULE | Freq: Every day | ORAL | 0 refills | Status: DC

## 2020-09-16 ENCOUNTER — Telehealth: Payer: Self-pay | Admitting: *Deleted

## 2020-09-16 NOTE — Telephone Encounter (Signed)
It looks Dr. Mike Gip sent a refill [facility] last month on 08/20/20. [facility] and they are now trying to process it with the old auth from last month.   Ms. Linam fills at [facility]. Her presciption for May was sent there. I will call [facility] and cancel the prescription there. Bethena Roys is contacting [facility] to make sure there is no issue processing the prescription for this month. We should be good to go after that.

## 2020-09-16 NOTE — Telephone Encounter (Signed)
[facility] called stating that they got prescription for Revlimid with last months auth #. I see in her chart that prescription was sent ot [facility] on 5/13 with an auth # from 5/12. Please check into this

## 2020-09-16 NOTE — Telephone Encounter (Signed)
This is a [place] patient that is transitioning care to Dr. Janese Banks.

## 2020-09-18 ENCOUNTER — Inpatient Hospital Stay: Payer: Medicare HMO

## 2020-09-18 ENCOUNTER — Encounter: Payer: Self-pay | Admitting: Oncology

## 2020-09-18 ENCOUNTER — Inpatient Hospital Stay (HOSPITAL_BASED_OUTPATIENT_CLINIC_OR_DEPARTMENT_OTHER): Payer: Medicare HMO | Admitting: Oncology

## 2020-09-18 ENCOUNTER — Inpatient Hospital Stay: Payer: Medicare HMO | Attending: Oncology

## 2020-09-18 ENCOUNTER — Other Ambulatory Visit: Payer: Self-pay

## 2020-09-18 VITALS — BP 128/72 | HR 88 | Temp 96.5°F | Resp 20 | Wt 209.7 lb

## 2020-09-18 DIAGNOSIS — E538 Deficiency of other specified B group vitamins: Secondary | ICD-10-CM | POA: Diagnosis present

## 2020-09-18 DIAGNOSIS — Z803 Family history of malignant neoplasm of breast: Secondary | ICD-10-CM | POA: Insufficient documentation

## 2020-09-18 DIAGNOSIS — Z90710 Acquired absence of both cervix and uterus: Secondary | ICD-10-CM | POA: Diagnosis not present

## 2020-09-18 DIAGNOSIS — C9000 Multiple myeloma not having achieved remission: Secondary | ICD-10-CM | POA: Diagnosis present

## 2020-09-18 DIAGNOSIS — Z79899 Other long term (current) drug therapy: Secondary | ICD-10-CM | POA: Insufficient documentation

## 2020-09-18 DIAGNOSIS — Z7951 Long term (current) use of inhaled steroids: Secondary | ICD-10-CM | POA: Diagnosis not present

## 2020-09-18 DIAGNOSIS — D649 Anemia, unspecified: Secondary | ICD-10-CM

## 2020-09-18 DIAGNOSIS — Z7952 Long term (current) use of systemic steroids: Secondary | ICD-10-CM | POA: Insufficient documentation

## 2020-09-18 DIAGNOSIS — Z5111 Encounter for antineoplastic chemotherapy: Secondary | ICD-10-CM

## 2020-09-18 DIAGNOSIS — Z5181 Encounter for therapeutic drug level monitoring: Secondary | ICD-10-CM

## 2020-09-18 DIAGNOSIS — Z5112 Encounter for antineoplastic immunotherapy: Secondary | ICD-10-CM | POA: Diagnosis present

## 2020-09-18 DIAGNOSIS — Z7982 Long term (current) use of aspirin: Secondary | ICD-10-CM | POA: Diagnosis not present

## 2020-09-18 LAB — COMPREHENSIVE METABOLIC PANEL
ALT: 14 U/L (ref 0–44)
AST: 13 U/L — ABNORMAL LOW (ref 15–41)
Albumin: 3.9 g/dL (ref 3.5–5.0)
Alkaline Phosphatase: 95 U/L (ref 38–126)
Anion gap: 9 (ref 5–15)
BUN: 15 mg/dL (ref 8–23)
CO2: 28 mmol/L (ref 22–32)
Calcium: 9 mg/dL (ref 8.9–10.3)
Chloride: 105 mmol/L (ref 98–111)
Creatinine, Ser: 1.08 mg/dL — ABNORMAL HIGH (ref 0.44–1.00)
GFR, Estimated: 54 mL/min — ABNORMAL LOW (ref >60)
Glucose, Bld: 97 mg/dL (ref 70–99)
Potassium: 3.4 mmol/L — ABNORMAL LOW (ref 3.5–5.1)
Sodium: 142 mmol/L (ref 135–145)
Total Bilirubin: 0.7 mg/dL (ref 0.3–1.2)
Total Protein: 6.5 g/dL (ref 6.5–8.1)

## 2020-09-18 LAB — CBC WITH DIFFERENTIAL/PLATELET
Abs Immature Granulocytes: 0.04 10*3/uL (ref 0.00–0.07)
Basophils Absolute: 0.1 10*3/uL (ref 0.0–0.1)
Basophils Relative: 2 %
Eosinophils Absolute: 0.4 10*3/uL (ref 0.0–0.5)
Eosinophils Relative: 7 %
HCT: 41.1 % (ref 36.0–46.0)
Hemoglobin: 13.8 g/dL (ref 12.0–15.0)
Immature Granulocytes: 1 %
Lymphocytes Relative: 15 %
Lymphs Abs: 1 10*3/uL (ref 0.7–4.0)
MCH: 30.7 pg (ref 26.0–34.0)
MCHC: 33.6 g/dL (ref 30.0–36.0)
MCV: 91.3 fL (ref 80.0–100.0)
Monocytes Absolute: 0.8 10*3/uL (ref 0.1–1.0)
Monocytes Relative: 11 %
Neutro Abs: 4.4 10*3/uL (ref 1.7–7.7)
Neutrophils Relative %: 64 %
Platelets: 339 10*3/uL (ref 150–400)
RBC: 4.5 MIL/uL (ref 3.87–5.11)
RDW: 14.7 % (ref 11.5–15.5)
WBC: 6.7 10*3/uL (ref 4.0–10.5)
nRBC: 0 % (ref 0.0–0.2)

## 2020-09-18 LAB — MAGNESIUM: Magnesium: 1.9 mg/dL (ref 1.7–2.4)

## 2020-09-18 MED ORDER — DEXAMETHASONE 4 MG PO TABS
20.0000 mg | ORAL_TABLET | Freq: Once | ORAL | Status: AC
  Administered 2020-09-18: 20 mg via ORAL
  Filled 2020-09-18: qty 5

## 2020-09-18 MED ORDER — DENOSUMAB 120 MG/1.7ML ~~LOC~~ SOLN
120.0000 mg | Freq: Once | SUBCUTANEOUS | Status: AC
  Administered 2020-09-18: 120 mg via SUBCUTANEOUS
  Filled 2020-09-18: qty 1.7

## 2020-09-18 MED ORDER — DIPHENHYDRAMINE HCL 25 MG PO CAPS
50.0000 mg | ORAL_CAPSULE | Freq: Once | ORAL | Status: AC
Start: 2020-09-18 — End: 2020-09-18
  Administered 2020-09-18: 50 mg via ORAL
  Filled 2020-09-18: qty 2

## 2020-09-18 MED ORDER — DARATUMUMAB-HYALURONIDASE-FIHJ 1800-30000 MG-UT/15ML ~~LOC~~ SOLN
1800.0000 mg | Freq: Once | SUBCUTANEOUS | Status: AC
  Administered 2020-09-18: 1800 mg via SUBCUTANEOUS
  Filled 2020-09-18 (×2): qty 15

## 2020-09-18 MED ORDER — ONDANSETRON HCL 4 MG PO TABS
8.0000 mg | ORAL_TABLET | Freq: Once | ORAL | Status: AC
  Administered 2020-09-18: 8 mg via ORAL
  Filled 2020-09-18: qty 2

## 2020-09-18 MED ORDER — CYANOCOBALAMIN 1000 MCG/ML IJ SOLN
1000.0000 ug | Freq: Once | INTRAMUSCULAR | Status: AC
  Administered 2020-09-18: 1000 ug via INTRAMUSCULAR
  Filled 2020-09-18: qty 1

## 2020-09-18 MED ORDER — ACETAMINOPHEN 325 MG PO TABS
650.0000 mg | ORAL_TABLET | Freq: Once | ORAL | Status: AC
  Administered 2020-09-18: 650 mg via ORAL
  Filled 2020-09-18: qty 2

## 2020-09-18 NOTE — Telephone Encounter (Signed)
No, it should be all good now!

## 2020-09-18 NOTE — Progress Notes (Signed)
Pt will like to know if she can have her B12 inj.

## 2020-09-18 NOTE — Progress Notes (Signed)
Hematology/Oncology Consult note [facility]  Telephone:([phone] Fax:([phone]  Patient Care Team: Hillery Aldo, MD as PCP - General (Family Medicine) Rosey Bath, MD as Referring Physician (Hematology and Oncology)   Name of the patient: Laurie [name]  [id number]  22-Sep-1945   Date of visit: 09/18/20  Diagnosis-stage III biclonal IgA multiple myeloma  Chief complaint/ Reason for visit-on treatment assessment prior to dose 10 of Darzalex and to receive Xgeva  Heme/Onc history: Patient is a 75 year old female who was diagnosed with stage III biclonal IgA multiple myeloma in June 2021.SPEP was 5.0 gm/dL on 59/32/7288. Random urine revealed 196.6 mg/dL total protein with 89.9% M-spike. Bone marrow biopsy on 11/13/2019 revealed a hypercellular bone marrow with extensive involvement by plasma cell neoplasm. Atypical plasma cells represented 70% of all cells in the aspirate and was associated with prominent interstitial infiltrates and diffuse sheets in the clot. Plasma cells were kappa light chain restricted. Cytogeneticsrevealed 54~56, XX, +1, der(1;14)(q10;q10), +3, +5, +7, add (8)(p21), +9, +9, +11, add (11)(p14), add(12)(q24.2), +15, +15, +19, +21[cp8]/46,XX[12]. FISHrevealed a gain of the long arm of chromosome 1 (CKS1B)(3R2G, 50%, normal <8.6%) and chromosome 11q/11 (3R, 56%, not observed in validation studies). Bone surveyon 10/26/2019 revealed small lytic lesions in the skull. There was no acute fracture.  Patient has been on daratumumab Revlimid dexamethasone regimen since July 2021.  She briefly received Retacrit in the past but has not required it for over 1 year now.  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  Has some baseline fatigue.  Reports that her gums are sore but symptoms are getting better.  She has baseline neuropathy and foot numbness which is stable.  ECOG PS- 1 Pain  scale- 0   Review of systems- Review of Systems  Constitutional: Positive for malaise/fatigue. Negative for chills, fever and weight loss.  HENT: Negative for congestion, ear discharge and nosebleeds.   Eyes: Negative for blurred vision.  Respiratory: Negative for cough, hemoptysis, sputum production, shortness of breath and wheezing.   Cardiovascular: Negative for chest pain, palpitations, orthopnea and claudication.  Gastrointestinal: Negative for abdominal pain, blood in stool, constipation, diarrhea, heartburn, melena, nausea and vomiting.  Genitourinary: Negative for dysuria, flank pain, frequency, hematuria and urgency.  Musculoskeletal: Negative for back pain, joint pain and myalgias.  Skin: Negative for rash.  Neurological: Negative for dizziness, tingling, focal weakness, seizures, weakness and headaches.  Endo/Heme/Allergies: Does not bruise/bleed easily.  Psychiatric/Behavioral: Negative for depression and suicidal ideas. The patient does not have insomnia.       Allergies  Allergen Reactions  . Levofloxacin     Critical   . Other Other (See Comments)    Antihistamines     Past Medical History:  Diagnosis Date  . Anxiety   . Asthma   . Depression   . Head injury   . Multiple myeloma (HCC)   . Osteoporosis      Past Surgical History:  Procedure Laterality Date  . ABDOMINAL HYSTERECTOMY    . HEMORROIDECTOMY    . SEPTOPLASTY      Social History   Socioeconomic History  . Marital status: Widowed    Spouse name: Not on file  . Number of children: Not on file  . Years of education: Not on file  . Highest education level: Not on file  Occupational History  . Not on file  Tobacco Use  . Smoking status: Never Smoker  . Smokeless tobacco: Never Used  Vaping Use  . Vaping Use: Not on file  Substance and Sexual Activity  . Alcohol use: No  . Drug use: No  . Sexual activity: Not on file  Other Topics Concern  . Not on file  Social History Narrative   . Not on file   Social Determinants of Health   Financial Resource Strain: Not on file  Food Insecurity: Not on file  Transportation Needs: Not on file  Physical Activity: Not on file  Stress: Not on file  Social Connections: Not on file  Intimate Partner Violence: Not on file    Family History  Problem Relation Age of Onset  . Breast cancer Cousin        mat cousin     Current Outpatient Medications:  .  acyclovir (ZOVIRAX) 400 MG tablet, TAKE 1 TABLET BY MOUTH TWICE A DAY, Disp: 180 tablet, Rfl: 3 .  albuterol (VENTOLIN HFA) 108 (90 Base) MCG/ACT inhaler, 2 inhalations every 4 (four) hours as needed., Disp: , Rfl:  .  alendronate (FOSAMAX) 70 MG tablet, 70 mg once a week., Disp: , Rfl:  .  aspirin 81 MG chewable tablet, Chew by mouth daily., Disp: , Rfl:  .  atorvastatin (LIPITOR) 20 MG tablet, Take 20 mg by mouth daily., Disp: , Rfl:  .  busPIRone (BUSPAR) 10 MG tablet, Take 10 mg by mouth 2 (two) times daily., Disp: , Rfl:  .  Calcium Carb-Cholecalciferol (CALCIUM 600 + D PO), Take 600 mg by mouth 5 (five) times daily. Increase calcium from $RemoveBefo'600mg'wsxNnBPCkhD$  4 times a day to $Remo'600mg'Jdcdz$  5 times a day starting today, Disp: , Rfl:  .  dexamethasone (DECADRON) 4 MG tablet, 5 TAB BY MOUTH ONCE A WEEK. 5 TAB WEEKLY THE DAY AFTER DARATUMUMAB X 8 WEEKS. TAKE WITH BREAKFAST., Disp: 40 tablet, Rfl: 0 .  docusate sodium (COLACE) 100 MG capsule, Take 100 mg by mouth daily., Disp: , Rfl:  .  lenalidomide (REVLIMID) 10 MG capsule, Take 1 capsule (10 mg total) by mouth daily. Three weeks on, 1 week off., Disp: 21 capsule, Rfl: 0 .  montelukast (SINGULAIR) 10 MG tablet, Take 10 mg by mouth daily after breakfast., Disp: , Rfl:  .  naproxen (NAPROSYN) 500 MG tablet, Take 500 mg by mouth 2 (two) times daily with a meal., Disp: , Rfl:  .  nortriptyline (PAMELOR) 25 MG capsule, Take by mouth daily. At night, Disp: , Rfl:  .  omeprazole (PRILOSEC) 20 MG capsule, Take 20 mg by mouth daily., Disp: , Rfl:  .   ondansetron (ZOFRAN) 8 MG tablet, Take 1 tablet (8 mg total) by mouth every 8 (eight) hours as needed (Nausea or vomiting)., Disp: 30 tablet, Rfl: 1 .  sertraline (ZOLOFT) 100 MG tablet, Take 100 mg by mouth 2 (two) times daily., Disp: , Rfl:  .  traZODone (DESYREL) 100 MG tablet, Take 50 mg by mouth at bedtime., Disp: , Rfl:  .  Vitamin D, Ergocalciferol, (DRISDOL) 50000 UNITS CAPS capsule, Take 50,000 Units by mouth every 7 (seven) days. Once a month, Disp: , Rfl:  .  fluticasone-salmeterol (ADVAIR HFA) 230-21 MCG/ACT inhaler, Inhale into the lungs. (Patient not taking: Reported on 09/18/2020), Disp: , Rfl:   Physical exam:  Vitals:   09/18/20 0912  BP: 128/72  Pulse: 88  Resp: 20  Temp: (!) 96.5 F (35.8 C)  SpO2: 97%  Weight: 209 lb 10.5 oz (95.1 kg)   Physical Exam Constitutional:      Comments: She ambulates with  a walker and appears in no acute distress  HENT:     Mouth/Throat:     Comments: No mouth sores.  Some evidence of gum inflammation around the lower canine tooth is observed Cardiovascular:     Rate and Rhythm: Normal rate and regular rhythm.     Heart sounds: Normal heart sounds.  Pulmonary:     Effort: Pulmonary effort is normal.     Breath sounds: Normal breath sounds.  Abdominal:     General: Bowel sounds are normal.     Palpations: Abdomen is soft.  Musculoskeletal:     Cervical back: Normal range of motion.  Skin:    General: Skin is warm and dry.  Neurological:     Mental Status: She is alert and oriented to person, place, and time.      CMP Latest Ref Rng & Units 09/18/2020  Glucose 70 - 99 mg/dL 97  BUN 8 - 23 mg/dL 15  Creatinine 0.44 - 1.00 mg/dL 1.08(H)  Sodium 135 - 145 mmol/L 142  Potassium 3.5 - 5.1 mmol/L 3.4(L)  Chloride 98 - 111 mmol/L 105  CO2 22 - 32 mmol/L 28  Calcium 8.9 - 10.3 mg/dL 9.0  Total Protein 6.5 - 8.1 g/dL 6.5  Total Bilirubin 0.3 - 1.2 mg/dL 0.7  Alkaline Phos 38 - 126 U/L 95  AST 15 - 41 U/L 13(L)  ALT 0 - 44 U/L 14    CBC Latest Ref Rng & Units 09/18/2020  WBC 4.0 - 10.5 K/uL 6.7  Hemoglobin 12.0 - 15.0 g/dL 13.8  Hematocrit 36.0 - 46.0 % 41.1  Platelets 150 - 400 K/uL 339      Assessment and plan- Patient is a 75 y.o. female with stage III biclonal IgA kappa multiple myeloma currently on daratumumab Revlimid dexamethasone regimen here for on treatment assessment prior to cycle 11 of Darzalex  Patient is currently on monthly Darzalex and counts are okay to proceed with dose level today.  She will get this until progression or toxicity.  Labs so far indicate continued response to treatment with improvement in her serum free light chain ratio.  Have advised her to take Decadron 20 mg on a weekly basis and not just on day 2.  Patient is also on Revlimid 10 mg 3 weeks on and 1 week off which she will continue. Patient also knows to take low-dose aspirin and acyclovir prophylaxis  Patient will receive Xgeva today and again in 4 weeks  I will see her back in 4 weeks for dose 12 of Darzalex and based on her counts decide about increasing the dose of Revlimid at that time.  Also check CBC with differential CMP myeloma panel and serum free light chains on that day.  She would be due for a bone survey at this time which we will schedule  B12 deficiency: She gets monthly B12 and will get it today  Hypocalcemia: Currently resolved and patient is on oral calcium    Visit Diagnosis 1. Multiple myeloma not having achieved remission ([place])   2. High risk medication use   3. Encounter for antineoplastic chemotherapy   4. Encounter for monitoring denosumab therapy      Dr. Randa Evens, MD, MPH [facility] at [facility] [phone] 09/18/2020 1:06 PM

## 2020-09-18 NOTE — Patient Instructions (Signed)
C[facility]ER CENTER [place] [facility] [place]  Discharge Instructions: Thank you for choosing [facility] to provide your oncology and hematology care.  If you have a lab appointment with the Cancer Center, please go directly to the Cancer Center and check in at the registration area.  Wear comfortable clothing and clothing appropriate for easy access to any Portacath or PICC line.   We strive to give you quality time with your provider. You may need to reschedule your appointment if you arrive late (15 or more minutes).  Arriving late affects you and other patients whose appointments are after yours.  Also, if you miss three or more appointments without notifying the office, you may be dismissed from the clinic at the provider's discretion.      For prescription refill requests, have your pharmacy contact our office and allow 72 hours for refills to be completed.        To help prevent nausea and vomiting after your treatment, we encourage you to take your nausea medication as directed.  BELOW ARE SYMPTOMS THAT SHOULD BE REPORTED IMMEDIATELY: *FEVER GREATER THAN 100.4 F (38 C) OR HIGHER *CHILLS OR SWEATING *NAUSEA AND VOMITING THAT IS NOT CONTROLLED WITH YOUR NAUSEA MEDICATION *UNUSUAL SHORTNESS OF BREATH *UNUSUAL BRUISING OR BLEEDING *URINARY PROBLEMS (pain or burning when urinating, or frequent urination) *BOWEL PROBLEMS (unusual diarrhea, constipation, pain near the anus) TENDERNESS IN MOUTH AND THROAT WITH OR WITHOUT PRESENCE OF ULCERS (sore throat, sores in mouth, or a toothache) UNUSUAL RASH, SWELLING OR PAIN  UNUSUAL VAGINAL DISCHARGE OR ITCHING   Items with * indicate a potential emergency and should be followed up as soon as possible or go to the Emergency Department if any problems should occur.  Please show the CHEMOTHERAPY ALERT CARD or IMMUNOTHERAPY ALERT CARD at check-in to the Emergency Department and triage nurse.  Should you have questions after your visit or  need to cancel or reschedule your appointment, please contact [facility] [place] [facility]  [phone] and follow the prompts.  Office hours are 8:00 a.m. to 4:30 p.m. Monday - Friday. Please note that voicemails left after 4:00 p.m. may not be returned until the following business day.  We are closed weekends and major holidays. You have access to a nurse at all times for urgent questions. Please call the main number to the clinic [phone] and follow the prompts.  For any non-urgent questions, you may also contact your provider using MyChart. We now offer e-Visits for anyone 18 and older to request care online for non-urgent symptoms. For details visit [email or web address].   Also download the MyChart app! Go to the app store, search "MyChart", open the app, select [facility], and log in with your MyChart username and password.  Due to Covid, a mask is required upon entering the hospital/clinic. If you do not have a mask, one will be given to you upon arrival. For doctor visits, patients may have 1 support person aged 18 or older with them. For treatment visits, patients cannot have anyone with them due to current Covid guidelines and our immunocompromised population.  

## 2020-09-19 LAB — KAPPA/LAMBDA LIGHT CHAINS
Kappa free light chain: 25.4 mg/L — ABNORMAL HIGH (ref 3.3–19.4)
Kappa, lambda light chain ratio: 9.07 — ABNORMAL HIGH (ref 0.26–1.65)
Lambda free light chains: 2.8 mg/L — ABNORMAL LOW (ref 5.7–26.3)

## 2020-09-20 LAB — PROTEIN ELECTROPHORESIS, SERUM
A/G Ratio: 1.7 (ref 0.7–1.7)
Albumin ELP: 3.6 g/dL (ref 2.9–4.4)
Alpha-1-Globulin: 0.3 g/dL (ref 0.0–0.4)
Alpha-2-Globulin: 0.7 g/dL (ref 0.4–1.0)
Beta Globulin: 0.9 g/dL (ref 0.7–1.3)
Gamma Globulin: 0.3 g/dL — ABNORMAL LOW (ref 0.4–1.8)
Globulin, Total: 2.1 g/dL — ABNORMAL LOW (ref 2.2–3.9)
M-Spike, %: 0.1 g/dL — ABNORMAL HIGH
Total Protein ELP: 5.7 g/dL — ABNORMAL LOW (ref 6.0–8.5)

## 2020-09-23 ENCOUNTER — Ambulatory Visit: Payer: Medicare HMO

## 2020-09-25 ENCOUNTER — Ambulatory Visit
Admission: RE | Admit: 2020-09-25 | Discharge: 2020-09-25 | Disposition: A | Discharge status: Home / Self Care | Payer: Medicare HMO | Source: Ambulatory Visit | Attending: Oncology | Admitting: Oncology

## 2020-09-25 ENCOUNTER — Other Ambulatory Visit: Payer: Self-pay

## 2020-09-25 DIAGNOSIS — C9000 Multiple myeloma not having achieved remission: Secondary | ICD-10-CM | POA: Diagnosis present

## 2020-10-09 ENCOUNTER — Other Ambulatory Visit: Payer: Self-pay | Admitting: *Deleted

## 2020-10-16 ENCOUNTER — Inpatient Hospital Stay: Payer: Medicare HMO | Attending: Oncology

## 2020-10-16 ENCOUNTER — Other Ambulatory Visit: Payer: Self-pay

## 2020-10-16 ENCOUNTER — Inpatient Hospital Stay: Payer: Medicare HMO

## 2020-10-16 ENCOUNTER — Inpatient Hospital Stay (HOSPITAL_BASED_OUTPATIENT_CLINIC_OR_DEPARTMENT_OTHER): Payer: Medicare HMO | Admitting: Oncology

## 2020-10-16 ENCOUNTER — Encounter: Payer: Self-pay | Admitting: Oncology

## 2020-10-16 VITALS — BP 142/79 | HR 97 | Temp 96.5°F | Resp 16 | Wt 204.3 lb

## 2020-10-16 DIAGNOSIS — Z5181 Encounter for therapeutic drug level monitoring: Secondary | ICD-10-CM

## 2020-10-16 DIAGNOSIS — E538 Deficiency of other specified B group vitamins: Secondary | ICD-10-CM

## 2020-10-16 DIAGNOSIS — Z5112 Encounter for antineoplastic immunotherapy: Secondary | ICD-10-CM | POA: Diagnosis present

## 2020-10-16 DIAGNOSIS — C9000 Multiple myeloma not having achieved remission: Secondary | ICD-10-CM | POA: Diagnosis present

## 2020-10-16 DIAGNOSIS — Z79899 Other long term (current) drug therapy: Secondary | ICD-10-CM | POA: Diagnosis not present

## 2020-10-16 DIAGNOSIS — Z7982 Long term (current) use of aspirin: Secondary | ICD-10-CM | POA: Diagnosis not present

## 2020-10-16 DIAGNOSIS — Z5111 Encounter for antineoplastic chemotherapy: Secondary | ICD-10-CM

## 2020-10-16 DIAGNOSIS — D649 Anemia, unspecified: Secondary | ICD-10-CM

## 2020-10-16 LAB — CBC WITH DIFFERENTIAL/PLATELET
Abs Immature Granulocytes: 0.03 10*3/uL (ref 0.00–0.07)
Basophils Absolute: 0.1 10*3/uL (ref 0.0–0.1)
Basophils Relative: 1 %
Eosinophils Absolute: 0.7 10*3/uL — ABNORMAL HIGH (ref 0.0–0.5)
Eosinophils Relative: 10 %
HCT: 40.4 % (ref 36.0–46.0)
Hemoglobin: 13.7 g/dL (ref 12.0–15.0)
Immature Granulocytes: 0 %
Lymphocytes Relative: 13 %
Lymphs Abs: 0.9 10*3/uL (ref 0.7–4.0)
MCH: 31.5 pg (ref 26.0–34.0)
MCHC: 33.9 g/dL (ref 30.0–36.0)
MCV: 92.9 fL (ref 80.0–100.0)
Monocytes Absolute: 0.7 10*3/uL (ref 0.1–1.0)
Monocytes Relative: 10 %
Neutro Abs: 4.7 10*3/uL (ref 1.7–7.7)
Neutrophils Relative %: 66 %
Platelets: 310 10*3/uL (ref 150–400)
RBC: 4.35 MIL/uL (ref 3.87–5.11)
RDW: 14.9 % (ref 11.5–15.5)
WBC: 7 10*3/uL (ref 4.0–10.5)
nRBC: 0 % (ref 0.0–0.2)

## 2020-10-16 LAB — COMPREHENSIVE METABOLIC PANEL
ALT: 16 U/L (ref 0–44)
AST: 14 U/L — ABNORMAL LOW (ref 15–41)
Albumin: 4.1 g/dL (ref 3.5–5.0)
Alkaline Phosphatase: 85 U/L (ref 38–126)
Anion gap: 7 (ref 5–15)
BUN: 21 mg/dL (ref 8–23)
CO2: 26 mmol/L (ref 22–32)
Calcium: 9.5 mg/dL (ref 8.9–10.3)
Chloride: 105 mmol/L (ref 98–111)
Creatinine, Ser: 1.04 mg/dL — ABNORMAL HIGH (ref 0.44–1.00)
GFR, Estimated: 57 mL/min — ABNORMAL LOW (ref >60)
Glucose, Bld: 95 mg/dL (ref 70–99)
Potassium: 3.9 mmol/L (ref 3.5–5.1)
Sodium: 138 mmol/L (ref 135–145)
Total Bilirubin: 0.4 mg/dL (ref 0.3–1.2)
Total Protein: 6.2 g/dL — ABNORMAL LOW (ref 6.5–8.1)

## 2020-10-16 MED ORDER — ACETAMINOPHEN 325 MG PO TABS
650.0000 mg | ORAL_TABLET | Freq: Once | ORAL | Status: AC
Start: 2020-10-16 — End: 2020-10-16
  Administered 2020-10-16: 650 mg via ORAL
  Filled 2020-10-16: qty 2

## 2020-10-16 MED ORDER — DENOSUMAB 120 MG/1.7ML ~~LOC~~ SOLN
120.0000 mg | Freq: Once | SUBCUTANEOUS | Status: AC
  Administered 2020-10-16: 120 mg via SUBCUTANEOUS
  Filled 2020-10-16: qty 1.7

## 2020-10-16 MED ORDER — ONDANSETRON HCL 4 MG PO TABS
8.0000 mg | ORAL_TABLET | Freq: Once | ORAL | Status: AC
Start: 2020-10-16 — End: 2020-10-16
  Administered 2020-10-16: 8 mg via ORAL
  Filled 2020-10-16: qty 2

## 2020-10-16 MED ORDER — DEXAMETHASONE 4 MG PO TABS
20.0000 mg | ORAL_TABLET | Freq: Once | ORAL | Status: AC
  Administered 2020-10-16: 20 mg via ORAL
  Filled 2020-10-16: qty 5

## 2020-10-16 MED ORDER — DIPHENHYDRAMINE HCL 25 MG PO CAPS
50.0000 mg | ORAL_CAPSULE | Freq: Once | ORAL | Status: AC
  Administered 2020-10-16: 50 mg via ORAL
  Filled 2020-10-16: qty 2

## 2020-10-16 MED ORDER — DARATUMUMAB-HYALURONIDASE-FIHJ 1800-30000 MG-UT/15ML ~~LOC~~ SOLN
1800.0000 mg | Freq: Once | SUBCUTANEOUS | Status: AC
  Administered 2020-10-16: 1800 mg via SUBCUTANEOUS
  Filled 2020-10-16: qty 15

## 2020-10-16 MED ORDER — CYANOCOBALAMIN 1000 MCG/ML IJ SOLN
1000.0000 ug | Freq: Once | INTRAMUSCULAR | Status: AC
  Administered 2020-10-16: 1000 ug via INTRAMUSCULAR
  Filled 2020-10-16: qty 1

## 2020-10-16 NOTE — Progress Notes (Signed)
Hematology/Oncology Consult note [facility]  Telephone:([phone] Fax:([phone]  Patient Care Team: Denton Lank, MD as PCP - General (Family Medicine) Lequita Asal, MD as Referring Physician (Hematology and Oncology)   Name of the patient: Laurie [name]  [id number]  1945/05/17   Date of visit: 10/16/20  Diagnosis- stage III biclonal IgA multiple myeloma  Chief complaint/ Reason for visit-on treatment assessment prior to dose 11 of Darzalex and to receive Xgeva B12  Heme/Onc history: Patient is a 75 year old female who was diagnosed with stage III biclonal IgA multiple myeloma in June 2021. SPEP was 5.0 gm/dL on 10/03/2019. Random urine revealed 196.6 mg/dL total protein with 58.3% M-spike.  Bone marrow biopsy on 11/13/2019 revealed a hypercellular bone marrow with extensive involvement by plasma cell neoplasm. Atypical plasma cells represented 70% of all cells in the aspirate and was associated with prominent interstitial infiltrates and diffuse sheets in the clot.  Plasma cells were kappa light chain restricted.   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   Bone survey on 10/26/2019 revealed small lytic lesions in the skull. There was no acute fracture.   Patient has been on daratumumab Revlimid dexamethasone regimen since July 2021.  She briefly received Retacrit in the past but has not required it for over 1 year now.   Patient is a Sales promotion account executive [employer]  Interval history-patient reports doing well presently.  She has baseline neuropathy and numbness in her foot which is overall stable.  Has mild baseline fatigue.  Denies any skin rash or diarrhea.  Tolerating Revlimid well  without any significant side effects  ECOG PS- 1 Pain scale- 0 Opioid associated constipation- no  Review of systems- Review of Systems  Constitutional:  Positive for malaise/fatigue. Negative for chills, fever and weight loss.  HENT:  Negative for congestion, ear discharge and nosebleeds.   Eyes:  Negative for blurred vision.  Respiratory:  Negative for cough, hemoptysis, sputum production, shortness of breath and wheezing.   Cardiovascular:  Negative for chest pain, palpitations, orthopnea and claudication.  Gastrointestinal:  Negative for abdominal pain, blood in stool, constipation, diarrhea, heartburn, melena, nausea and vomiting.  Genitourinary:  Negative for dysuria, flank pain, frequency, hematuria and urgency.  Musculoskeletal:  Negative for back pain, joint pain and myalgias.  Skin:  Negative for rash.  Neurological:  Positive for sensory change (Peripheral neuropathy). Negative for dizziness, tingling, focal weakness, seizures, weakness and headaches.  Endo/Heme/Allergies:  Does not bruise/bleed easily.  Psychiatric/Behavioral:  Negative for depression and suicidal ideas. The patient does not have insomnia.      Allergies  Allergen Reactions   Levofloxacin     Critical    Other Other (See Comments)    Antihistamines     Past Medical History:  Diagnosis Date   Anxiety    Asthma    Depression    Head injury    Multiple myeloma ([place])    Osteoporosis      Past Surgical History:  Procedure Laterality Date   ABDOMINAL HYSTERECTOMY     HEMORROIDECTOMY     SEPTOPLASTY      Social History   Socioeconomic History   Marital status: Widowed    Spouse name: Not on file   Number of children:  Not on file   Years of education: Not on file   Highest education level: Not on file  Occupational History   Not on file  Tobacco Use   Smoking status: Never   Smokeless tobacco: Never  Vaping Use   Vaping Use: Not on file  Substance and Sexual Activity   Alcohol use:  No   Drug use: No   Sexual activity: Not on file  Other Topics Concern   Not on file  Social History Narrative   Not on file   Social Determinants of Health   Financial Resource Strain: Not on file  Food Insecurity: Not on file  Transportation Needs: Not on file  Physical Activity: Not on file  Stress: Not on file  Social Connections: Not on file  Intimate Partner Violence: Not on file    Family History  Problem Relation Age of Onset   Breast cancer Cousin        mat cousin     Current Outpatient Medications:    acyclovir (ZOVIRAX) 400 MG tablet, TAKE 1 TABLET BY MOUTH TWICE A DAY, Disp: 180 tablet, Rfl: 3   albuterol (VENTOLIN HFA) 108 (90 Base) MCG/ACT inhaler, 2 inhalations every 4 (four) hours as needed., Disp: , Rfl:    alendronate (FOSAMAX) 70 MG tablet, 70 mg once a week., Disp: , Rfl:    aspirin 81 MG chewable tablet, Chew by mouth daily., Disp: , Rfl:    atorvastatin (LIPITOR) 20 MG tablet, Take 20 mg by mouth daily., Disp: , Rfl:    busPIRone (BUSPAR) 10 MG tablet, Take 10 mg by mouth 2 (two) times daily., Disp: , Rfl:    Calcium Carb-Cholecalciferol (CALCIUM 600 + D PO), Take 600 mg by mouth 5 (five) times daily. Increase calcium from 668m 4 times a day to 6079m5 times a day starting today, Disp: , Rfl:    dexamethasone (DECADRON) 4 MG tablet, 5 TAB BY MOUTH ONCE A WEEK. 5 TAB WEEKLY THE DAY AFTER DARATUMUMAB X 8 WEEKS. TAKE WITH BREAKFAST., Disp: 40 tablet, Rfl: 0   docusate sodium (COLACE) 100 MG capsule, Take 100 mg by mouth daily., Disp: , Rfl:    lenalidomide (REVLIMID) 10 MG capsule, Take 1 capsule (10 mg total) by mouth daily. Three weeks on, 1 week off., Disp: 21 capsule, Rfl: 0   montelukast (SINGULAIR) 10 MG tablet, Take 10 mg by mouth daily after breakfast., Disp: , Rfl:    naproxen (NAPROSYN) 500 MG tablet, Take 500 mg by mouth 2 (two) times daily with a meal., Disp: , Rfl:    nortriptyline (PAMELOR) 25 MG capsule, Take by mouth daily. At night, Disp: ,  Rfl:    omeprazole (PRILOSEC) 20 MG capsule, Take 20 mg by mouth daily., Disp: , Rfl:    ondansetron (ZOFRAN) 8 MG tablet, Take 1 tablet (8 mg total) by mouth every 8 (eight) hours as needed (Nausea or vomiting)., Disp: 30 tablet, Rfl: 1   penicillin v potassium (VEETID) 500 MG tablet, Take 500 mg by mouth 3 (three) times daily., Disp: , Rfl:    sertraline (ZOLOFT) 100 MG tablet, Take 100 mg by mouth 2 (two) times daily., Disp: , Rfl:    traZODone (DESYREL) 100 MG tablet, Take 50 mg by mouth at bedtime., Disp: , Rfl:    Vitamin D, Ergocalciferol, (DRISDOL) 50000 UNITS CAPS capsule, Take 50,000 Units by mouth every 7 (seven) days. Once a month, Disp: , Rfl:    fluticasone-salmeterol (ADVAIR HFA) 230-21 MCG/ACT inhaler, Inhale  into the lungs. (Patient not taking: No sig reported), Disp: , Rfl:   Physical exam:  Vitals:   10/16/20 0832  Weight: 204 lb 4.1 oz (92.7 kg)   Physical Exam Constitutional:      General: She is not in acute distress. Cardiovascular:     Rate and Rhythm: Normal rate and regular rhythm.     Heart sounds: Normal heart sounds.  Pulmonary:     Effort: Pulmonary effort is normal.     Breath sounds: Normal breath sounds.  Skin:    General: Skin is warm and dry.  Neurological:     Mental Status: She is alert and oriented to person, place, and time.     CMP Latest Ref Rng & Units 09/18/2020  Glucose 70 - 99 mg/dL 97  BUN 8 - 23 mg/dL 15  Creatinine 0.44 - 1.00 mg/dL 1.08(H)  Sodium 135 - 145 mmol/L 142  Potassium 3.5 - 5.1 mmol/L 3.4(L)  Chloride 98 - 111 mmol/L 105  CO2 22 - 32 mmol/L 28  Calcium 8.9 - 10.3 mg/dL 9.0  Total Protein 6.5 - 8.1 g/dL 6.5  Total Bilirubin 0.3 - 1.2 mg/dL 0.7  Alkaline Phos 38 - 126 U/L 95  AST 15 - 41 U/L 13(L)  ALT 0 - 44 U/L 14   CBC Latest Ref Rng & Units 10/16/2020  WBC 4.0 - 10.5 K/uL 7.0  Hemoglobin 12.0 - 15.0 g/dL 13.7  Hematocrit 36.0 - 46.0 % 40.4  Platelets 150 - 400 K/uL 310    No images are attached to the  encounter.  DG Bone Survey Met  Result Date: 09/26/2020 CLINICAL DATA:  Multiple myeloma EXAM: METASTATIC BONE SURVEY COMPARISON:  10/26/2019 FINDINGS: Metastatic bone survey was performed. A view of the chest demonstrates increased sclerosis in the rib cage bilaterally related to prior fractures with healing. No definitive lytic lesions are noted within the ribcage. Cervical spine demonstrates mild degenerative change without lytic or sclerotic lesions. The skull demonstrates multiple lytic lesions which appear increased in both size and number when compared with the prior study. Upper extremities show no lytic lesions. No acute fractures are noted. Thoracic spine demonstrates the pedicles to be within normal limits. No compression deformity is noted. Osteophytic changes are seen. No paraspinal mass is noted. Lumbar spine shows 5 lumbar vertebra. No lytic or sclerotic lesions are seen. Pelvis and lower extremities show degenerative changes of the hip and knee joints right slightly greater than left. No lytic or sclerotic foci are noted. IMPRESSION: Multiple lytic lesions within the skull which appear increased in size and number. Healing rib fractures. No other focal lytic or sclerotic lesions are seen. Electronically Signed   By: Inez Catalina M.D.   On: 09/26/2020 14:15     Assessment and plan- Patient is a 75 y.o. female with stage III biclonal IgA kappa multiple myeloma currently on daratumumab Revlimid dexamethasone regimen.  She is here for on treatment assessment prior to dose 12 of Darzalex  Counts okay to proceed with dose 12 of Darzalex today.  She will also receive Xgeva and B12 shot today.  I will plan to increase her Revlimid dose from 10 mg to 15 mg 3 weeks on and 1 week off if she can tolerate it.  If this shipment for this month has already been processed and we will make the change starting next month.  In terms of her myeloma labs her serum free kappa light chains are trending down.   Although the ratio  was higher at 9 as compared to 6 prior it was due to lowering of her lambda chains.  M protein is currently stable around 0.1.  No evidence of anemia or renal failure.Also reviewed the results of bone survey which showed that the skull lesions were appearing more number as compared to the one that was done prior a year ago.  However given that her myeloma labs are trending down in the right direction I am not inclined to change her treatment at this time.  Patient will continue Decadron on a weekly basis as well as acyclovir prophylaxis and low-dose aspirin.  I will see her back in 4 weeks for dose 13 of Darzalex with CBC with differential CMP myeloma panel and serum free light chains   Visit Diagnosis 1. Multiple myeloma not having achieved remission ([place])   2. Encounter for antineoplastic chemotherapy   3. High risk medication use   4. B12 deficiency   5. Encounter for monitoring denosumab therapy      Dr. Randa Evens, MD, MPH [facility] at [facility] [phone] 10/16/2020 9:52 AM

## 2020-10-16 NOTE — Patient Instructions (Signed)
[facility] [place] [facility]  Discharge Instructions: Thank you for choosing [facility] to provide your oncology and hematology care.  If you have a lab appointment with the Cancer Center, please go directly to the Cancer Center and check in at the registration area.  Wear comfortable clothing and clothing appropriate for easy access to any Portacath or PICC line.   We strive to give you quality time with your provider. You may need to reschedule your appointment if you arrive late (15 or more minutes).  Arriving late affects you and other patients whose appointments are after yours.  Also, if you miss three or more appointments without notifying the office, you may be dismissed from the clinic at the provider's discretion.      For prescription refill requests, have your pharmacy contact our office and allow 72 hours for refills to be completed.    Today you received the following chemotherapy and/or immunotherapy agents       To help prevent nausea and vomiting after your treatment, we encourage you to take your nausea medication as directed.  BELOW ARE SYMPTOMS THAT SHOULD BE REPORTED IMMEDIATELY: *FEVER GREATER THAN 100.4 F (38 C) OR HIGHER *CHILLS OR SWEATING *NAUSEA AND VOMITING THAT IS NOT CONTROLLED WITH YOUR NAUSEA MEDICATION *UNUSUAL SHORTNESS OF BREATH *UNUSUAL BRUISING OR BLEEDING *URINARY PROBLEMS (pain or burning when urinating, or frequent urination) *BOWEL PROBLEMS (unusual diarrhea, constipation, pain near the anus) TENDERNESS IN MOUTH AND THROAT WITH OR WITHOUT PRESENCE OF ULCERS (sore throat, sores in mouth, or a toothache) UNUSUAL RASH, SWELLING OR PAIN  UNUSUAL VAGINAL DISCHARGE OR ITCHING   Items with * indicate a potential emergency and should be followed up as soon as possible or go to the Emergency Department if any problems should occur.  Please show the CHEMOTHERAPY ALERT CARD or IMMUNOTHERAPY ALERT CARD at check-in to the Emergency  Department and triage nurse.  Should you have questions after your visit or need to cancel or reschedule your appointment, please contact [facility] [place] [facility]  [phone] and follow the prompts.  Office hours are 8:00 a.m. to 4:30 p.m. Monday - Friday. Please note that voicemails left after 4:00 p.m. may not be returned until the following business day.  We are closed weekends and major holidays. You have access to a nurse at all times for urgent questions. Please call the main number to the clinic [phone] and follow the prompts.  For any non-urgent questions, you may also contact your provider using MyChart. We now offer e-Visits for anyone 18 and older to request care online for non-urgent symptoms. For details visit [email or web address].   Also download the MyChart app! Go to the app store, search "MyChart", open the app, select [facility], and log in with your MyChart username and password.  Due to Covid, a mask is required upon entering the hospital/clinic. If you do not have a mask, one will be given to you upon arrival. For doctor visits, patients may have 1 support person aged 18 or older with them. For treatment visits, patients cannot have anyone with them due to current Covid guidelines and our immunocompromised population.  

## 2020-10-17 LAB — KAPPA/LAMBDA LIGHT CHAINS
Kappa free light chain: 12.7 mg/L (ref 3.3–19.4)
Kappa, lambda light chain ratio: 3.97 — ABNORMAL HIGH (ref 0.26–1.65)
Lambda free light chains: 3.2 mg/L — ABNORMAL LOW (ref 5.7–26.3)

## 2020-10-18 ENCOUNTER — Encounter: Payer: Self-pay | Admitting: Hematology and Oncology

## 2020-10-18 ENCOUNTER — Encounter: Payer: Self-pay | Admitting: *Deleted

## 2020-10-18 ENCOUNTER — Other Ambulatory Visit: Payer: Self-pay | Admitting: *Deleted

## 2020-10-18 ENCOUNTER — Telehealth: Payer: Self-pay | Admitting: *Deleted

## 2020-10-18 MED ORDER — LENALIDOMIDE 15 MG PO CAPS: 15.0000 mg | ORAL_CAPSULE | Freq: Every day | ORAL | 0 refills | Status: DC

## 2020-10-18 NOTE — Telephone Encounter (Signed)
Patient called stating that she needs her Revlimid and that Dr Janese Banks was going to increase her dose to 15 mg. It needs to go to [facility]  Counts okay to proceed with dose 12 of Darzalex today.  She will also receive Xgeva and B12 shot today.  I will plan to increase her Revlimid dose from 10 mg to 15 mg 3 weeks on and 1 week off if she can tolerate it.  If this shipment for this month has already been processed and we will make the change starting next month.    No shipment was sent for this month

## 2020-10-22 LAB — MULTIPLE MYELOMA PANEL, SERUM
Albumin SerPl Elph-Mcnc: 3.5 g/dL (ref 2.9–4.4)
Albumin/Glob SerPl: 1.8 — ABNORMAL HIGH (ref 0.7–1.7)
Alpha 1: 0.2 g/dL (ref 0.0–0.4)
Alpha2 Glob SerPl Elph-Mcnc: 0.7 g/dL (ref 0.4–1.0)
B-Globulin SerPl Elph-Mcnc: 0.8 g/dL (ref 0.7–1.3)
Gamma Glob SerPl Elph-Mcnc: 0.2 g/dL — ABNORMAL LOW (ref 0.4–1.8)
Globulin, Total: 2 g/dL — ABNORMAL LOW (ref 2.2–3.9)
IgA: 57 mg/dL — ABNORMAL LOW (ref 64–422)
IgG (Immunoglobin G), Serum: 253 mg/dL — ABNORMAL LOW (ref 586–1602)
IgM (Immunoglobulin M), Srm: 12 mg/dL — ABNORMAL LOW (ref 26–217)
Total Protein ELP: 5.5 g/dL — ABNORMAL LOW (ref 6.0–8.5)

## 2020-10-23 ENCOUNTER — Other Ambulatory Visit: Payer: Self-pay | Admitting: Family Medicine

## 2020-10-23 DIAGNOSIS — I739 Peripheral vascular disease, unspecified: Secondary | ICD-10-CM

## 2020-10-24 ENCOUNTER — Ambulatory Visit: Payer: Medicare HMO

## 2020-11-13 ENCOUNTER — Inpatient Hospital Stay: Payer: Medicare HMO

## 2020-11-13 ENCOUNTER — Inpatient Hospital Stay: Payer: Medicare HMO | Attending: Oncology

## 2020-11-13 ENCOUNTER — Encounter: Payer: Self-pay | Admitting: Oncology

## 2020-11-13 ENCOUNTER — Inpatient Hospital Stay (HOSPITAL_BASED_OUTPATIENT_CLINIC_OR_DEPARTMENT_OTHER): Payer: Medicare HMO | Admitting: Oncology

## 2020-11-13 VITALS — BP 145/77 | HR 97 | Temp 96.8°F | Resp 16 | Wt 206.6 lb

## 2020-11-13 DIAGNOSIS — E538 Deficiency of other specified B group vitamins: Secondary | ICD-10-CM | POA: Diagnosis present

## 2020-11-13 DIAGNOSIS — Z7952 Long term (current) use of systemic steroids: Secondary | ICD-10-CM | POA: Insufficient documentation

## 2020-11-13 DIAGNOSIS — D649 Anemia, unspecified: Secondary | ICD-10-CM

## 2020-11-13 DIAGNOSIS — G629 Polyneuropathy, unspecified: Secondary | ICD-10-CM | POA: Diagnosis not present

## 2020-11-13 DIAGNOSIS — C9000 Multiple myeloma not having achieved remission: Secondary | ICD-10-CM

## 2020-11-13 DIAGNOSIS — J45909 Unspecified asthma, uncomplicated: Secondary | ICD-10-CM | POA: Insufficient documentation

## 2020-11-13 DIAGNOSIS — Z5181 Encounter for therapeutic drug level monitoring: Secondary | ICD-10-CM | POA: Diagnosis not present

## 2020-11-13 DIAGNOSIS — Z79899 Other long term (current) drug therapy: Secondary | ICD-10-CM

## 2020-11-13 DIAGNOSIS — Z5111 Encounter for antineoplastic chemotherapy: Secondary | ICD-10-CM

## 2020-11-13 DIAGNOSIS — Z803 Family history of malignant neoplasm of breast: Secondary | ICD-10-CM | POA: Insufficient documentation

## 2020-11-13 DIAGNOSIS — Z5112 Encounter for antineoplastic immunotherapy: Secondary | ICD-10-CM | POA: Insufficient documentation

## 2020-11-13 DIAGNOSIS — Z90710 Acquired absence of both cervix and uterus: Secondary | ICD-10-CM | POA: Insufficient documentation

## 2020-11-13 DIAGNOSIS — Z7951 Long term (current) use of inhaled steroids: Secondary | ICD-10-CM | POA: Diagnosis not present

## 2020-11-13 DIAGNOSIS — Z7982 Long term (current) use of aspirin: Secondary | ICD-10-CM | POA: Insufficient documentation

## 2020-11-13 LAB — CBC WITH DIFFERENTIAL/PLATELET
Abs Immature Granulocytes: 0.06 10*3/uL (ref 0.00–0.07)
Basophils Absolute: 0.1 10*3/uL (ref 0.0–0.1)
Basophils Relative: 1 %
Eosinophils Absolute: 0.7 10*3/uL — ABNORMAL HIGH (ref 0.0–0.5)
Eosinophils Relative: 9 %
HCT: 41.2 % (ref 36.0–46.0)
Hemoglobin: 14 g/dL (ref 12.0–15.0)
Immature Granulocytes: 1 %
Lymphocytes Relative: 14 %
Lymphs Abs: 1 10*3/uL (ref 0.7–4.0)
MCH: 31.8 pg (ref 26.0–34.0)
MCHC: 34 g/dL (ref 30.0–36.0)
MCV: 93.6 fL (ref 80.0–100.0)
Monocytes Absolute: 0.8 10*3/uL (ref 0.1–1.0)
Monocytes Relative: 10 %
Neutro Abs: 4.9 10*3/uL (ref 1.7–7.7)
Neutrophils Relative %: 65 %
Platelets: 263 10*3/uL (ref 150–400)
RBC: 4.4 MIL/uL (ref 3.87–5.11)
RDW: 14.6 % (ref 11.5–15.5)
WBC: 7.4 10*3/uL (ref 4.0–10.5)
nRBC: 0 % (ref 0.0–0.2)

## 2020-11-13 LAB — COMPREHENSIVE METABOLIC PANEL
ALT: 22 U/L (ref 0–44)
AST: 16 U/L (ref 15–41)
Albumin: 4 g/dL (ref 3.5–5.0)
Alkaline Phosphatase: 101 U/L (ref 38–126)
Anion gap: 6 (ref 5–15)
BUN: 21 mg/dL (ref 8–23)
CO2: 29 mmol/L (ref 22–32)
Calcium: 8.9 mg/dL (ref 8.9–10.3)
Chloride: 102 mmol/L (ref 98–111)
Creatinine, Ser: 1.13 mg/dL — ABNORMAL HIGH (ref 0.44–1.00)
GFR, Estimated: 51 mL/min — ABNORMAL LOW (ref >60)
Glucose, Bld: 100 mg/dL — ABNORMAL HIGH (ref 70–99)
Potassium: 4 mmol/L (ref 3.5–5.1)
Sodium: 137 mmol/L (ref 135–145)
Total Bilirubin: 0.8 mg/dL (ref 0.3–1.2)
Total Protein: 6.2 g/dL — ABNORMAL LOW (ref 6.5–8.1)

## 2020-11-13 MED ORDER — SODIUM CHLORIDE 0.9 % IV SOLN
Freq: Once | INTRAVENOUS | Status: DC
  Filled 2020-11-13: qty 250

## 2020-11-13 MED ORDER — DARATUMUMAB-HYALURONIDASE-FIHJ 1800-30000 MG-UT/15ML ~~LOC~~ SOLN
1800.0000 mg | Freq: Once | SUBCUTANEOUS | Status: AC
  Administered 2020-11-13: 1800 mg via SUBCUTANEOUS
  Filled 2020-11-13: qty 15

## 2020-11-13 MED ORDER — CYANOCOBALAMIN 1000 MCG/ML IJ SOLN
1000.0000 ug | Freq: Once | INTRAMUSCULAR | Status: AC
  Administered 2020-11-13: 1000 ug via INTRAMUSCULAR
  Filled 2020-11-13: qty 1

## 2020-11-13 MED ORDER — DIPHENHYDRAMINE HCL 25 MG PO CAPS
50.0000 mg | ORAL_CAPSULE | Freq: Once | ORAL | Status: AC
  Administered 2020-11-13: 50 mg via ORAL
  Filled 2020-11-13: qty 2

## 2020-11-13 MED ORDER — ONDANSETRON HCL 4 MG PO TABS
8.0000 mg | ORAL_TABLET | Freq: Once | ORAL | Status: AC
  Administered 2020-11-13: 8 mg via ORAL
  Filled 2020-11-13: qty 2

## 2020-11-13 MED ORDER — DENOSUMAB 120 MG/1.7ML ~~LOC~~ SOLN
120.0000 mg | Freq: Once | SUBCUTANEOUS | Status: AC
  Administered 2020-11-13: 120 mg via SUBCUTANEOUS

## 2020-11-13 MED ORDER — ACETAMINOPHEN 325 MG PO TABS
650.0000 mg | ORAL_TABLET | Freq: Once | ORAL | Status: AC
Start: 2020-11-13 — End: 2020-11-13
  Administered 2020-11-13: 650 mg via ORAL
  Filled 2020-11-13: qty 2

## 2020-11-13 MED ORDER — DEXAMETHASONE 4 MG PO TABS
20.0000 mg | ORAL_TABLET | Freq: Once | ORAL | Status: AC
Start: 2020-11-13 — End: 2020-11-13
  Administered 2020-11-13: 20 mg via ORAL
  Filled 2020-11-13: qty 5

## 2020-11-13 NOTE — Patient Instructions (Signed)
[facility] [place] [facility]  Discharge Instructions: Thank you for choosing [facility] to provide your oncology and hematology care.  If you have a lab appointment with the Cancer Center, please go directly to the Cancer Center and check in at the registration area.  Wear comfortable clothing and clothing appropriate for easy access to any Portacath or PICC line.   We strive to give you quality time with your provider. You may need to reschedule your appointment if you arrive late (15 or more minutes).  Arriving late affects you and other patients whose appointments are after yours.  Also, if you miss three or more appointments without notifying the office, you may be dismissed from the clinic at the provider's discretion.      For prescription refill requests, have your pharmacy contact our office and allow 72 hours for refills to be completed.    Today you received the following chemotherapy and/or immunotherapy agents       To help prevent nausea and vomiting after your treatment, we encourage you to take your nausea medication as directed.  BELOW ARE SYMPTOMS THAT SHOULD BE REPORTED IMMEDIATELY: *FEVER GREATER THAN 100.4 F (38 C) OR HIGHER *CHILLS OR SWEATING *NAUSEA AND VOMITING THAT IS NOT CONTROLLED WITH YOUR NAUSEA MEDICATION *UNUSUAL SHORTNESS OF BREATH *UNUSUAL BRUISING OR BLEEDING *URINARY PROBLEMS (pain or burning when urinating, or frequent urination) *BOWEL PROBLEMS (unusual diarrhea, constipation, pain near the anus) TENDERNESS IN MOUTH AND THROAT WITH OR WITHOUT PRESENCE OF ULCERS (sore throat, sores in mouth, or a toothache) UNUSUAL RASH, SWELLING OR PAIN  UNUSUAL VAGINAL DISCHARGE OR ITCHING   Items with * indicate a potential emergency and should be followed up as soon as possible or go to the Emergency Department if any problems should occur.  Please show the CHEMOTHERAPY ALERT CARD or IMMUNOTHERAPY ALERT CARD at check-in to the Emergency  Department and triage nurse.  Should you have questions after your visit or need to cancel or reschedule your appointment, please contact [facility] [place] [facility]  [phone] and follow the prompts.  Office hours are 8:00 a.m. to 4:30 p.m. Monday - Friday. Please note that voicemails left after 4:00 p.m. may not be returned until the following business day.  We are closed weekends and major holidays. You have access to a nurse at all times for urgent questions. Please call the main number to the clinic [phone] and follow the prompts.  For any non-urgent questions, you may also contact your provider using MyChart. We now offer e-Visits for anyone 18 and older to request care online for non-urgent symptoms. For details visit [email or web address].   Also download the MyChart app! Go to the app store, search "MyChart", open the app, select [facility], and log in with your MyChart username and password.  Due to Covid, a mask is required upon entering the hospital/clinic. If you do not have a mask, one will be given to you upon arrival. For doctor visits, patients may have 1 support person aged 18 or older with them. For treatment visits, patients cannot have anyone with them due to current Covid guidelines and our immunocompromised population.  

## 2020-11-13 NOTE — Progress Notes (Signed)
Hematology/Oncology Consult note [facility]  Telephone:([phone] [phone]  Patient Care Team: Hillery Aldo, MD as PCP - General (Family Medicine) Rosey Bath, MD as Referring Physician (Hematology and Oncology)   Name of the patient: Laurie [name]  [id number]  1946-03-15   Date of visit: 11/13/20  Diagnosis- stage III biclonal IgA multiple myeloma  Chief complaint/ Reason for visit-on treatment assessment prior to next dose of monthly Darzalex and Xgeva and B12 shot  Heme/Onc history: Patient is a 75 year old female who was diagnosed with stage III biclonal IgA multiple myeloma in June 2021. SPEP was 5.0 gm/dL on 73/12/1681. Random urine revealed 196.6 mg/dL total protein with 87.0% M-spike.  Bone marrow biopsy on 11/13/2019 revealed a hypercellular bone marrow with extensive involvement by plasma cell neoplasm. Atypical plasma cells represented 70% of all cells in the aspirate and was associated with prominent interstitial infiltrates and diffuse sheets in the clot.  Plasma cells were kappa light chain restricted.   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   Bone survey on 10/26/2019 revealed small lytic lesions in the skull. There was no acute fracture.   Patient has been on daratumumab Revlimid dexamethasone regimen since July 2021.  She briefly received Retacrit in the past but has not required it for over 1 year now.   Patient is a [employer] teacher [employer]  Interval history-overall patient is doing well.  Neuropathy in her hands and feet is stable.  We did increase the dose of Revlimid to 15 mg which she is tolerating well without any significant side effects.  ECOG PS-  1 Pain scale- 0   Review of systems- Review of Systems  Constitutional:  Positive for malaise/fatigue. Negative for chills, fever and weight loss.  HENT:  Negative for congestion, ear discharge and nosebleeds.   Eyes:  Negative for blurred vision.  Respiratory:  Negative for cough, hemoptysis, sputum production, shortness of breath and wheezing.   Cardiovascular:  Negative for chest pain, palpitations, orthopnea and claudication.  Gastrointestinal:  Negative for abdominal pain, blood in stool, constipation, diarrhea, heartburn, melena, nausea and vomiting.  Genitourinary:  Negative for dysuria, flank pain, frequency, hematuria and urgency.  Musculoskeletal:  Negative for back pain, joint pain and myalgias.  Skin:  Negative for rash.  Neurological:  Negative for dizziness, tingling, focal weakness, seizures, weakness and headaches.  Endo/Heme/Allergies:  Does not bruise/bleed easily.  Psychiatric/Behavioral:  Negative for depression and suicidal ideas. The patient does not have insomnia.       Allergies  Allergen Reactions   Levofloxacin     Critical    Other Other (See Comments)    Antihistamines     Past Medical History:  Diagnosis Date   Anxiety    Asthma    Depression    Head injury    Multiple myeloma (HCC)    Osteoporosis      Past Surgical History:  Procedure Laterality Date   ABDOMINAL HYSTERECTOMY     HEMORROIDECTOMY     SEPTOPLASTY      Social History   Socioeconomic History   Marital status: Widowed    Spouse name: Not on file   Number of children: Not on file   Years of education: Not on file  Highest education level: Not on file  Occupational History   Not on file  Tobacco Use   Smoking status: Never   Smokeless tobacco: Never  Vaping Use   Vaping Use: Not on file  Substance and Sexual Activity   Alcohol use: No   Drug use: No   Sexual activity: Not on file  Other Topics Concern   Not on file  Social History Narrative   Not on file    Social Determinants of Health   Financial Resource Strain: Not on file  Food Insecurity: Not on file  Transportation Needs: Not on file  Physical Activity: Not on file  Stress: Not on file  Social Connections: Not on file  Intimate Partner Violence: Not on file    Family History  Problem Relation Age of Onset   Breast cancer Cousin        mat cousin     Current Outpatient Medications:    acyclovir (ZOVIRAX) 400 MG tablet, TAKE 1 TABLET BY MOUTH TWICE A DAY, Disp: 180 tablet, Rfl: 3   albuterol (VENTOLIN HFA) 108 (90 Base) MCG/ACT inhaler, 2 inhalations every 4 (four) hours as needed., Disp: , Rfl:    alendronate (FOSAMAX) 70 MG tablet, 70 mg once a week., Disp: , Rfl:    aspirin 81 MG chewable tablet, Chew by mouth daily., Disp: , Rfl:    atorvastatin (LIPITOR) 20 MG tablet, Take 20 mg by mouth daily., Disp: , Rfl:    busPIRone (BUSPAR) 10 MG tablet, Take 10 mg by mouth 2 (two) times daily., Disp: , Rfl:    Calcium Carb-Cholecalciferol (CALCIUM 600 + D PO), Take 600 mg by mouth 5 (five) times daily. Increase calcium from $RemoveBefo'600mg'wdayCyBsFQJ$  4 times a day to $Remo'600mg'tVfEr$  5 times a day starting today, Disp: , Rfl:    dexamethasone (DECADRON) 4 MG tablet, 5 TAB BY MOUTH ONCE A WEEK. 5 TAB WEEKLY THE DAY AFTER DARATUMUMAB X 8 WEEKS. TAKE WITH BREAKFAST., Disp: 40 tablet, Rfl: 0   docusate sodium (COLACE) 100 MG capsule, Take 100 mg by mouth daily., Disp: , Rfl:    lenalidomide (REVLIMID) 15 MG capsule, Take 1 capsule (15 mg total) by mouth daily. Take 1 tablet daily x 3 weeks and 1 week off    [employer] Auth # [id number]     Date Obtained 10/18/2020, Disp: 21 capsule, Rfl: 0   montelukast (SINGULAIR) 10 MG tablet, Take 10 mg by mouth daily after breakfast., Disp: , Rfl:    naproxen (NAPROSYN) 500 MG tablet, Take 500 mg by mouth 2 (two) times daily with a meal., Disp: , Rfl:    nortriptyline (PAMELOR) 25 MG capsule, Take by mouth daily. At night, Disp: , Rfl:    omeprazole (PRILOSEC) 20 MG capsule, Take 20 mg by  mouth daily., Disp: , Rfl:    ondansetron (ZOFRAN) 8 MG tablet, Take 1 tablet (8 mg total) by mouth every 8 (eight) hours as needed (Nausea or vomiting)., Disp: 30 tablet, Rfl: 1   sertraline (ZOLOFT) 100 MG tablet, Take 100 mg by mouth 2 (two) times daily., Disp: , Rfl:    traZODone (DESYREL) 100 MG tablet, Take 50 mg by mouth at bedtime., Disp: , Rfl:    Vitamin D, Ergocalciferol, (DRISDOL) 50000 UNITS CAPS capsule, Take 50,000 Units by mouth every 7 (seven) days. Once a month, Disp: , Rfl:    fluticasone-salmeterol (ADVAIR HFA) 230-21 MCG/ACT inhaler, Inhale into the lungs. (Patient not taking: No sig reported), Disp: , Rfl:  No current facility-administered  medications for this visit.  Facility-Administered Medications Ordered in Other Visits:    0.9 %  sodium chloride infusion, , Intravenous, Once, Sindy Guadeloupe, MD  Physical exam:  Vitals:   11/13/20 0854  BP: (!) 145/77  Pulse: 97  Resp: 16  Temp: (!) 96.8 F (36 C)  SpO2: 96%  Weight: 206 lb 9.1 oz (93.7 kg)   Physical Exam Constitutional:      General: She is not in acute distress.    Comments: Ambulates with a walker.  Appears in no acute distress  Cardiovascular:     Rate and Rhythm: Normal rate and regular rhythm.     Heart sounds: Normal heart sounds.  Pulmonary:     Effort: Pulmonary effort is normal.     Breath sounds: Normal breath sounds.  Abdominal:     General: Bowel sounds are normal.     Palpations: Abdomen is soft.  Skin:    General: Skin is warm and dry.  Neurological:     Mental Status: She is alert and oriented to person, place, and time.     CMP Latest Ref Rng & Units 11/13/2020  Glucose 70 - 99 mg/dL 100(H)  BUN 8 - 23 mg/dL 21  Creatinine 0.44 - 1.00 mg/dL 1.13(H)  Sodium 135 - 145 mmol/L 137  Potassium 3.5 - 5.1 mmol/L 4.0  Chloride 98 - 111 mmol/L 102  CO2 22 - 32 mmol/L 29  Calcium 8.9 - 10.3 mg/dL 8.9  Total Protein 6.5 - 8.1 g/dL 6.2(L)  Total Bilirubin 0.3 - 1.2 mg/dL 0.8  Alkaline  Phos 38 - 126 U/L 101  AST 15 - 41 U/L 16  ALT 0 - 44 U/L 22   CBC Latest Ref Rng & Units 11/13/2020  WBC 4.0 - 10.5 K/uL 7.4  Hemoglobin 12.0 - 15.0 g/dL 14.0  Hematocrit 36.0 - 46.0 % 41.2  Platelets 150 - 400 K/uL 263   Assessment and plan- Patient is a 75 y.o. female with stage III biclonal IgA kappa multiple myeloma currently on daratumumab Revlimid and dexamethasone regimen she is here for next dose of monthly Darzalex Xgeva and B12 shot  Counts okay to proceed with Darzalex Xgeva and B12 today.  Patient is taking Revlimid at home 15 mg 3 weeks on 1 week off.  She also takes weekly Decadron.  Patient is also on low-dose aspirin and acyclovir prophylaxis.  Myeloma panel and serum free light chains from today are pending but overall labs show at least partial remission.No M protein was detected on SPEP last month.  Immunofixation still detects IgA and IgG monoclonal protein.  Serum free light chains ratio is trending down and is down to 3.9 presently.  Kappa light chains are normal  I will see her back in 4 weeks for next cycle of Darzalex Xgeva and B12   Visit Diagnosis 1. Multiple myeloma not having achieved remission ([place])   2. Encounter for antineoplastic chemotherapy   3. High risk medication use   4. B12 deficiency   5. Encounter for monitoring denosumab therapy      Dr. Randa Evens, MD, MPH [facility] [phone] 11/13/2020 12:50 PM

## 2020-11-14 LAB — KAPPA/LAMBDA LIGHT CHAINS
Kappa free light chain: 9.9 mg/L (ref 3.3–19.4)
Kappa, lambda light chain ratio: 2.91 — ABNORMAL HIGH (ref 0.26–1.65)
Lambda free light chains: 3.4 mg/L — ABNORMAL LOW (ref 5.7–26.3)

## 2020-11-18 LAB — MULTIPLE MYELOMA PANEL, SERUM
Albumin SerPl Elph-Mcnc: 3.7 g/dL (ref 2.9–4.4)
Albumin/Glob SerPl: 1.9 — ABNORMAL HIGH (ref 0.7–1.7)
Alpha 1: 0.2 g/dL (ref 0.0–0.4)
Alpha2 Glob SerPl Elph-Mcnc: 0.7 g/dL (ref 0.4–1.0)
B-Globulin SerPl Elph-Mcnc: 0.8 g/dL (ref 0.7–1.3)
Gamma Glob SerPl Elph-Mcnc: 0.2 g/dL — ABNORMAL LOW (ref 0.4–1.8)
Globulin, Total: 2 g/dL — ABNORMAL LOW (ref 2.2–3.9)
IgA: 38 mg/dL — ABNORMAL LOW (ref 64–422)
IgG (Immunoglobin G), Serum: 244 mg/dL — ABNORMAL LOW (ref 586–1602)
IgM (Immunoglobulin M), Srm: 15 mg/dL — ABNORMAL LOW (ref 26–217)
Total Protein ELP: 5.7 g/dL — ABNORMAL LOW (ref 6.0–8.5)

## 2020-11-20 ENCOUNTER — Ambulatory Visit
Admission: RE | Admit: 2020-11-20 | Discharge: 2020-11-20 | Disposition: A | Discharge status: Home / Self Care | Payer: Medicare HMO | Source: Ambulatory Visit | Attending: Family Medicine | Admitting: Family Medicine

## 2020-11-20 ENCOUNTER — Other Ambulatory Visit: Payer: Self-pay

## 2020-11-20 DIAGNOSIS — I739 Peripheral vascular disease, unspecified: Secondary | ICD-10-CM | POA: Insufficient documentation

## 2020-11-25 ENCOUNTER — Ambulatory Visit: Payer: Medicare HMO

## 2020-11-26 ENCOUNTER — Ambulatory Visit: Payer: Medicare HMO

## 2020-12-04 ENCOUNTER — Other Ambulatory Visit: Payer: Self-pay | Admitting: *Deleted

## 2020-12-05 ENCOUNTER — Other Ambulatory Visit: Payer: Self-pay | Admitting: Oncology

## 2020-12-06 ENCOUNTER — Other Ambulatory Visit: Payer: Self-pay | Admitting: *Deleted

## 2020-12-06 ENCOUNTER — Encounter: Payer: Self-pay | Admitting: Hematology and Oncology

## 2020-12-06 MED ORDER — LENALIDOMIDE 15 MG PO CAPS: 15.0000 mg | ORAL_CAPSULE | Freq: Every day | ORAL | 0 refills | Status: DC

## 2020-12-11 ENCOUNTER — Other Ambulatory Visit: Payer: Self-pay

## 2020-12-11 ENCOUNTER — Inpatient Hospital Stay: Payer: Medicare HMO | Attending: Oncology

## 2020-12-11 ENCOUNTER — Inpatient Hospital Stay (HOSPITAL_BASED_OUTPATIENT_CLINIC_OR_DEPARTMENT_OTHER): Payer: Medicare HMO | Admitting: Oncology

## 2020-12-11 ENCOUNTER — Inpatient Hospital Stay: Payer: Medicare HMO

## 2020-12-11 ENCOUNTER — Encounter: Payer: Self-pay | Admitting: Oncology

## 2020-12-11 VITALS — HR 99

## 2020-12-11 VITALS — BP 126/82 | HR 106 | Temp 97.5°F | Resp 18 | Wt 201.9 lb

## 2020-12-11 DIAGNOSIS — C9000 Multiple myeloma not having achieved remission: Secondary | ICD-10-CM

## 2020-12-11 DIAGNOSIS — Z5112 Encounter for antineoplastic immunotherapy: Secondary | ICD-10-CM | POA: Insufficient documentation

## 2020-12-11 DIAGNOSIS — Z7952 Long term (current) use of systemic steroids: Secondary | ICD-10-CM | POA: Diagnosis not present

## 2020-12-11 DIAGNOSIS — Z803 Family history of malignant neoplasm of breast: Secondary | ICD-10-CM | POA: Insufficient documentation

## 2020-12-11 DIAGNOSIS — Z79899 Other long term (current) drug therapy: Secondary | ICD-10-CM | POA: Diagnosis not present

## 2020-12-11 DIAGNOSIS — E538 Deficiency of other specified B group vitamins: Secondary | ICD-10-CM | POA: Insufficient documentation

## 2020-12-11 DIAGNOSIS — Z5111 Encounter for antineoplastic chemotherapy: Secondary | ICD-10-CM

## 2020-12-11 DIAGNOSIS — J45909 Unspecified asthma, uncomplicated: Secondary | ICD-10-CM | POA: Diagnosis not present

## 2020-12-11 DIAGNOSIS — Z7982 Long term (current) use of aspirin: Secondary | ICD-10-CM | POA: Diagnosis not present

## 2020-12-11 DIAGNOSIS — Z7951 Long term (current) use of inhaled steroids: Secondary | ICD-10-CM | POA: Diagnosis not present

## 2020-12-11 DIAGNOSIS — D649 Anemia, unspecified: Secondary | ICD-10-CM

## 2020-12-11 LAB — CBC WITH DIFFERENTIAL/PLATELET
Abs Immature Granulocytes: 0.04 10*3/uL (ref 0.00–0.07)
Basophils Absolute: 0.1 10*3/uL (ref 0.0–0.1)
Basophils Relative: 1 %
Eosinophils Absolute: 0.5 10*3/uL (ref 0.0–0.5)
Eosinophils Relative: 7 %
HCT: 43.4 % (ref 36.0–46.0)
Hemoglobin: 14.6 g/dL (ref 12.0–15.0)
Immature Granulocytes: 1 %
Lymphocytes Relative: 13 %
Lymphs Abs: 0.9 10*3/uL (ref 0.7–4.0)
MCH: 31.7 pg (ref 26.0–34.0)
MCHC: 33.6 g/dL (ref 30.0–36.0)
MCV: 94.1 fL (ref 80.0–100.0)
Monocytes Absolute: 0.7 10*3/uL (ref 0.1–1.0)
Monocytes Relative: 10 %
Neutro Abs: 5 10*3/uL (ref 1.7–7.7)
Neutrophils Relative %: 68 %
Platelets: 338 10*3/uL (ref 150–400)
RBC: 4.61 MIL/uL (ref 3.87–5.11)
RDW: 14.2 % (ref 11.5–15.5)
WBC: 7.3 10*3/uL (ref 4.0–10.5)
nRBC: 0 % (ref 0.0–0.2)

## 2020-12-11 LAB — COMPREHENSIVE METABOLIC PANEL
ALT: 25 U/L (ref 0–44)
AST: 16 U/L (ref 15–41)
Albumin: 4 g/dL (ref 3.5–5.0)
Alkaline Phosphatase: 134 U/L — ABNORMAL HIGH (ref 38–126)
Anion gap: 5 (ref 5–15)
BUN: 20 mg/dL (ref 8–23)
CO2: 29 mmol/L (ref 22–32)
Calcium: 8.5 mg/dL — ABNORMAL LOW (ref 8.9–10.3)
Chloride: 101 mmol/L (ref 98–111)
Creatinine, Ser: 1.26 mg/dL — ABNORMAL HIGH (ref 0.44–1.00)
GFR, Estimated: 45 mL/min — ABNORMAL LOW (ref >60)
Glucose, Bld: 91 mg/dL (ref 70–99)
Potassium: 4 mmol/L (ref 3.5–5.1)
Sodium: 135 mmol/L (ref 135–145)
Total Bilirubin: 0.9 mg/dL (ref 0.3–1.2)
Total Protein: 6.7 g/dL (ref 6.5–8.1)

## 2020-12-11 MED ORDER — CYANOCOBALAMIN 1000 MCG/ML IJ SOLN
1000.0000 ug | Freq: Once | INTRAMUSCULAR | Status: AC
  Administered 2020-12-11: 1000 ug via INTRAMUSCULAR
  Filled 2020-12-11: qty 1

## 2020-12-11 MED ORDER — DEXAMETHASONE 4 MG PO TABS
20.0000 mg | ORAL_TABLET | Freq: Once | ORAL | Status: AC
  Administered 2020-12-11: 20 mg via ORAL
  Filled 2020-12-11: qty 5

## 2020-12-11 MED ORDER — ACETAMINOPHEN 325 MG PO TABS
650.0000 mg | ORAL_TABLET | Freq: Once | ORAL | Status: AC
  Administered 2020-12-11: 650 mg via ORAL
  Filled 2020-12-11: qty 2

## 2020-12-11 MED ORDER — DENOSUMAB 120 MG/1.7ML ~~LOC~~ SOLN: 120.0000 mg | SUBCUTANEOUS | Status: DC

## 2020-12-11 MED ORDER — ONDANSETRON HCL 4 MG PO TABS
8.0000 mg | ORAL_TABLET | Freq: Once | ORAL | Status: AC
  Administered 2020-12-11: 8 mg via ORAL
  Filled 2020-12-11: qty 2

## 2020-12-11 MED ORDER — DIPHENHYDRAMINE HCL 25 MG PO CAPS
50.0000 mg | ORAL_CAPSULE | Freq: Once | ORAL | Status: AC
  Administered 2020-12-11: 50 mg via ORAL
  Filled 2020-12-11: qty 2

## 2020-12-11 MED ORDER — DARATUMUMAB-HYALURONIDASE-FIHJ 1800-30000 MG-UT/15ML ~~LOC~~ SOLN
1800.0000 mg | Freq: Once | SUBCUTANEOUS | Status: AC
  Administered 2020-12-11: 1800 mg via SUBCUTANEOUS
  Filled 2020-12-11: qty 15

## 2020-12-11 NOTE — Progress Notes (Signed)
Pt states for several days the whole top of her head (where her lesions are) are in pain. Pain level: 5/10. Along with nasal drip that only occurs during the night, pt is currently having "hot flashes" in clinic stating she is very hot along with red eyes.

## 2020-12-11 NOTE — Progress Notes (Signed)
Ca 8.5, hold xgeva today per MD

## 2020-12-11 NOTE — Progress Notes (Signed)
Hematology/Oncology Consult note [facility]  Telephone:([phone] [phone]  Patient Care Team: Denton Lank, MD as PCP - General (Family Medicine) Lequita Asal, MD (Inactive) as Referring Physician (Hematology and Oncology)   Name of the patient: Laurie [name]  [id number]  12-11-1945   Date of visit: 12/11/20  Diagnosis- stage III biclonal IgA multiple myeloma    Chief complaint/ Reason for visit-routine follow-up of myeloma for next treatment of Darzalex and Xgeva and monthly B12 injections  Heme/Onc history:  Patient is a 75 year old female who was diagnosed with stage III biclonal IgA multiple myeloma in June 2021. SPEP was 5.0 gm/dL on 10/03/2019. Random urine revealed 196.6 mg/dL total protein with 58.3% M-spike.  Bone marrow biopsy on 11/13/2019 revealed a hypercellular bone marrow with extensive involvement by plasma cell neoplasm. Atypical plasma cells represented 70% of all cells in the aspirate and was associated with prominent interstitial infiltrates and diffuse sheets in the clot.  Plasma cells were kappa light chain restricted.   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   Bone survey on 10/26/2019 revealed small lytic lesions in the skull. There was no acute fracture.   Patient has been on daratumumab Revlimid dexamethasone regimen since July 2021.  She briefly received Retacrit in the past but has not required it for over 1 year now.   Patient is a Sales promotion account executive [employer]    Interval history: Patient reports having pain in her head which comes on and off but is not a frank headache.  Also reports intermittent nasal congestion and nose dripping which is mostly  clear.  She had 1 episode of hallucination in the morning which resolved after a few minutes.  ECOG PS- 1 Pain scale- 0  Review of systems- Review of Systems  Constitutional:  Positive for malaise/fatigue. Negative for chills, fever and weight loss.  HENT:  Negative for congestion, ear discharge and nosebleeds.   Eyes:  Negative for blurred vision.  Respiratory:  Negative for cough, hemoptysis, sputum production, shortness of breath and wheezing.   Cardiovascular:  Negative for chest pain, palpitations, orthopnea and claudication.  Gastrointestinal:  Negative for abdominal pain, blood in stool, constipation, diarrhea, heartburn, melena, nausea and vomiting.  Genitourinary:  Negative for dysuria, flank pain, frequency, hematuria and urgency.  Musculoskeletal:  Negative for back pain, joint pain and myalgias.  Skin:  Negative for rash.  Neurological:  Positive for headaches. Negative for dizziness, tingling, focal weakness, seizures and weakness.  Endo/Heme/Allergies:  Does not bruise/bleed easily.  Psychiatric/Behavioral:  Negative for depression and suicidal ideas. The patient does not have insomnia.       Allergies  Allergen Reactions   Levofloxacin     Critical    Other Other (See Comments)    Antihistamines     Past Medical History:  Diagnosis Date   Anxiety    Asthma    Depression    Head injury    Multiple myeloma ([place])    Osteoporosis      Past Surgical History:  Procedure Laterality Date   ABDOMINAL HYSTERECTOMY     HEMORROIDECTOMY     SEPTOPLASTY      Social History   Socioeconomic History   Marital status: Widowed    Spouse name:  Not on file   Number of children: Not on file   Years of education: Not on file   Highest education level: Not on file  Occupational History   Not on file  Tobacco Use   Smoking status: Never   Smokeless tobacco: Never  Vaping Use   Vaping Use: Not on file  Substance and Sexual Activity   Alcohol use: No   Drug use:  No   Sexual activity: Not on file  Other Topics Concern   Not on file  Social History Narrative   Not on file   Social Determinants of Health   Financial Resource Strain: Not on file  Food Insecurity: Not on file  Transportation Needs: Not on file  Physical Activity: Not on file  Stress: Not on file  Social Connections: Not on file  Intimate Partner Violence: Not on file    Family History  Problem Relation Age of Onset   Breast cancer Cousin        mat cousin     Current Outpatient Medications:    acyclovir (ZOVIRAX) 400 MG tablet, TAKE 1 TABLET BY MOUTH TWICE A DAY, Disp: 180 tablet, Rfl: 3   albuterol (VENTOLIN HFA) 108 (90 Base) MCG/ACT inhaler, 2 inhalations every 4 (four) hours as needed., Disp: , Rfl:    alendronate (FOSAMAX) 70 MG tablet, 70 mg once a week., Disp: , Rfl:    aspirin 81 MG chewable tablet, Chew by mouth daily., Disp: , Rfl:    atorvastatin (LIPITOR) 20 MG tablet, Take 20 mg by mouth daily., Disp: , Rfl:    busPIRone (BUSPAR) 10 MG tablet, Take 10 mg by mouth 2 (two) times daily., Disp: , Rfl:    Calcium Carb-Cholecalciferol (CALCIUM 600 + D PO), Take 600 mg by mouth 5 (five) times daily. Increase calcium from $RemoveBefo'600mg'lqlyHwUmjwe$  4 times a day to $Remo'600mg'wFwpb$  5 times a day starting today, Disp: , Rfl:    dexamethasone (DECADRON) 4 MG tablet, 5 TAB BY MOUTH ONCE A WEEK. 5 TAB WEEKLY THE DAY AFTER DARATUMUMAB X 8 WEEKS. TAKE WITH BREAKFAST., Disp: 40 tablet, Rfl: 0   docusate sodium (COLACE) 100 MG capsule, Take 100 mg by mouth daily., Disp: , Rfl:    lenalidomide (REVLIMID) 15 MG capsule, Take 1 capsule (15 mg total) by mouth daily. Take 1 tablet daily x 3 weeks and 1 week off    [employer] Auth # [id number]   Date Obtained 12/06/2020, Disp: 21 capsule, Rfl: 0   montelukast (SINGULAIR) 10 MG tablet, Take 10 mg by mouth daily after breakfast., Disp: , Rfl:    naproxen (NAPROSYN) 500 MG tablet, Take 500 mg by mouth 2 (two) times daily with a meal., Disp: , Rfl:    nortriptyline (PAMELOR)  25 MG capsule, Take by mouth daily. At night, Disp: , Rfl:    omeprazole (PRILOSEC) 20 MG capsule, Take 20 mg by mouth daily., Disp: , Rfl:    ondansetron (ZOFRAN) 8 MG tablet, Take 1 tablet (8 mg total) by mouth every 8 (eight) hours as needed (Nausea or vomiting)., Disp: 30 tablet, Rfl: 1   sertraline (ZOLOFT) 100 MG tablet, Take 100 mg by mouth 2 (two) times daily., Disp: , Rfl:    traZODone (DESYREL) 100 MG tablet, Take 50 mg by mouth at bedtime., Disp: , Rfl:    Vitamin D, Ergocalciferol, (DRISDOL) 50000 UNITS CAPS capsule, Take 50,000 Units by mouth every 7 (seven) days. Once a month, Disp: , Rfl:    fluticasone-salmeterol ([place]) 230-21  MCG/ACT inhaler, Inhale into the lungs. (Patient not taking: No sig reported), Disp: , Rfl:   Physical exam:  Vitals:   12/11/20 0859  BP: 126/82  Pulse: (!) 106  Resp: 18  Temp: (!) 97.5 F (36.4 C)  SpO2: 99%  Weight: 201 lb 15.1 oz (91.6 kg)   Physical Exam Constitutional:      General: She is not in acute distress. HENT:     Head:     Comments: No scalp herpetic lesions noted. Cardiovascular:     Rate and Rhythm: Normal rate and regular rhythm.     Heart sounds: Normal heart sounds.  Pulmonary:     Effort: Pulmonary effort is normal.     Breath sounds: Normal breath sounds.  Skin:    General: Skin is warm and dry.  Neurological:     Mental Status: She is alert and oriented to person, place, and time.     CMP Latest Ref Rng & Units 12/11/2020  Glucose 70 - 99 mg/dL 91  BUN 8 - 23 mg/dL 20  Creatinine 0.44 - 1.00 mg/dL 1.26(H)  Sodium 135 - 145 mmol/L 135  Potassium 3.5 - 5.1 mmol/L 4.0  Chloride 98 - 111 mmol/L 101  CO2 22 - 32 mmol/L 29  Calcium 8.9 - 10.3 mg/dL 8.5(L)  Total Protein 6.5 - 8.1 g/dL 6.7  Total Bilirubin 0.3 - 1.2 mg/dL 0.9  Alkaline Phos 38 - 126 U/L 134(H)  AST 15 - 41 U/L 16  ALT 0 - 44 U/L 25   CBC Latest Ref Rng & Units 12/11/2020  WBC 4.0 - 10.5 K/uL 7.3  Hemoglobin 12.0 - 15.0 g/dL 14.6   Hematocrit 36.0 - 46.0 % 43.4  Platelets 150 - 400 K/uL 338    No images are attached to the encounter.  US ARTERIAL ABI (SCREENING LOWER EXTREMITY)  Result Date: 11/21/2020 CLINICAL DATA:  Bilateral lower extremity rest pain. Hyperlipidemia, obesity EXAM: NONINVASIVE PHYSIOLOGIC VASCULAR STUDY OF BILATERAL LOWER EXTREMITIES TECHNIQUE: Evaluation of both lower extremities were performed at rest, including calculation of ankle-brachial indices with single level Doppler, pressure recording. COMPARISON:  None. FINDINGS: Right ABI:  1.13 Left ABI:  1.23 Right Lower Extremity:  Normal arterial waveforms at the ankle. Left Lower Extremity:  Normal arterial waveforms at the ankle. IMPRESSION: No evidence of hemodynamically significant lower extremity arterial occlusive disease at rest. Electronically Signed   By: Lucrezia Europe M.D.   On: 11/21/2020 08:06     Assessment and plan- Patient is a 75 y.o. female with stage III biclonal IgA kappa multiple myeloma here for follow-up of following issues:  Counts okay to proceed with next dose of Darzalex today.  Patient will continue to take weekly Decadron.  She is on Revlimid 15 mg 3 weeks on 1 week off which she will continue.  Also continue with aspirin and acyclovir prophylaxis.Myeloma panel and serum free light chains from today are pending.  Labs from last week revealed normalization of her kappa light chain and ratio which is down to 2.9.  Immunofixation shows IgG kappa which I think is from the Darzalex and may mask response to treatment. She will also receive her B12 shot today. Calcium levels are 8.5 today with a normal albumin.  We will hold Xgeva given risk of hypocalcemia  I will see her back in 4 weeks with labs for next cycle of Darzalex Xgeva and B12   Visit Diagnosis 1. Multiple myeloma not having achieved remission ([place])   2. Encounter for  antineoplastic chemotherapy   3. High risk medication use   4. B12 deficiency      Dr. Randa Evens,  MD, MPH [facility] at [facility] [phone] 12/11/2020 12:20 PM

## 2020-12-11 NOTE — Patient Instructions (Signed)
[facility]  Discharge Instructions: Thank you for choosing [facility] to provide your oncology and hematology care.  If you have a lab appointment with the Cancer Center, please go directly to the Cancer Center and check in at the registration area.  Wear comfortable clothing and clothing appropriate for easy access to any Portacath or PICC line.   We strive to give you quality time with your provider. You may need to reschedule your appointment if you arrive late (15 or more minutes).  Arriving late affects you and other patients whose appointments are after yours.  Also, if you miss three or more appointments without notifying the office, you may be dismissed from the clinic at the provider's discretion.      For prescription refill requests, have your pharmacy contact our office and allow 72 hours for refills to be completed.    Today you received the following chemotherapy and/or immunotherapy agents       To help prevent nausea and vomiting after your treatment, we encourage you to take your nausea medication as directed.  BELOW ARE SYMPTOMS THAT SHOULD BE REPORTED IMMEDIATELY: *FEVER GREATER THAN 100.4 F (38 C) OR HIGHER *CHILLS OR SWEATING *NAUSEA AND VOMITING THAT IS NOT CONTROLLED WITH YOUR NAUSEA MEDICATION *UNUSUAL SHORTNESS OF BREATH *UNUSUAL BRUISING OR BLEEDING *URINARY PROBLEMS (pain or burning when urinating, or frequent urination) *BOWEL PROBLEMS (unusual diarrhea, constipation, pain near the anus) TENDERNESS IN MOUTH AND THROAT WITH OR WITHOUT PRESENCE OF ULCERS (sore throat, sores in mouth, or a toothache) UNUSUAL RASH, SWELLING OR PAIN  UNUSUAL VAGINAL DISCHARGE OR ITCHING   Items with * indicate a potential emergency and should be followed up as soon as possible or go to the Emergency Department if any problems should occur.  Please show the CHEMOTHERAPY ALERT CARD or IMMUNOTHERAPY ALERT CARD at check-in to the Emergency  Department and triage nurse.  Should you have questions after your visit or need to cancel or reschedule your appointment, please contact [facility]  [phone] and follow the prompts.  Office hours are 8:00 a.m. to 4:30 p.m. Monday - Friday. Please note that voicemails left after 4:00 p.m. may not be returned until the following business day.  We are closed weekends and major holidays. You have access to a nurse at all times for urgent questions. Please call the main number to the clinic [phone] and follow the prompts.  For any non-urgent questions, you may also contact your provider using MyChart. We now offer e-Visits for anyone 18 and older to request care online for non-urgent symptoms. For details visit [email or web address].   Also download the MyChart app! Go to the app store, search "MyChart", open the app, select [facility], and log in with your MyChart username and password.  Due to Covid, a mask is required upon entering the hospital/clinic. If you do not have a mask, one will be given to you upon arrival. For doctor visits, patients may have 1 support person aged 18 or older with them. For treatment visits, patients cannot have anyone with them due to current Covid guidelines and our immunocompromised population.  

## 2020-12-12 LAB — KAPPA/LAMBDA LIGHT CHAINS
Kappa free light chain: 9.1 mg/L (ref 3.3–19.4)
Kappa, lambda light chain ratio: 2.76 — ABNORMAL HIGH (ref 0.26–1.65)
Lambda free light chains: 3.3 mg/L — ABNORMAL LOW (ref 5.7–26.3)

## 2020-12-16 LAB — MULTIPLE MYELOMA PANEL, SERUM
Albumin SerPl Elph-Mcnc: 3.7 g/dL (ref 2.9–4.4)
Albumin/Glob SerPl: 1.7 (ref 0.7–1.7)
Alpha 1: 0.3 g/dL (ref 0.0–0.4)
Alpha2 Glob SerPl Elph-Mcnc: 0.9 g/dL (ref 0.4–1.0)
B-Globulin SerPl Elph-Mcnc: 0.9 g/dL (ref 0.7–1.3)
Gamma Glob SerPl Elph-Mcnc: 0.2 g/dL — ABNORMAL LOW (ref 0.4–1.8)
Globulin, Total: 2.2 g/dL (ref 2.2–3.9)
IgA: 43 mg/dL — ABNORMAL LOW (ref 64–422)
IgG (Immunoglobin G), Serum: 248 mg/dL — ABNORMAL LOW (ref 586–1602)
IgM (Immunoglobulin M), Srm: 24 mg/dL — ABNORMAL LOW (ref 26–217)
Total Protein ELP: 5.9 g/dL — ABNORMAL LOW (ref 6.0–8.5)

## 2020-12-25 ENCOUNTER — Other Ambulatory Visit: Payer: Medicare HMO

## 2020-12-25 ENCOUNTER — Ambulatory Visit: Payer: Medicare HMO

## 2020-12-25 ENCOUNTER — Ambulatory Visit: Payer: Medicare HMO | Admitting: Oncology

## 2020-12-26 ENCOUNTER — Ambulatory Visit: Payer: Medicare HMO

## 2021-01-01 ENCOUNTER — Other Ambulatory Visit: Payer: Self-pay | Admitting: *Deleted

## 2021-01-01 ENCOUNTER — Other Ambulatory Visit: Payer: Self-pay

## 2021-01-01 MED ORDER — LENALIDOMIDE 15 MG PO CAPS: 15.0000 mg | ORAL_CAPSULE | Freq: Every day | ORAL | 0 refills | Status: DC

## 2021-01-02 ENCOUNTER — Encounter: Payer: Self-pay | Admitting: Hematology and Oncology

## 2021-01-14 ENCOUNTER — Other Ambulatory Visit: Payer: Self-pay | Admitting: Oncology

## 2021-01-15 ENCOUNTER — Encounter: Payer: Self-pay | Admitting: Oncology

## 2021-01-15 ENCOUNTER — Inpatient Hospital Stay: Payer: Medicare HMO | Attending: Oncology

## 2021-01-15 ENCOUNTER — Inpatient Hospital Stay: Payer: Medicare HMO

## 2021-01-15 ENCOUNTER — Inpatient Hospital Stay (HOSPITAL_BASED_OUTPATIENT_CLINIC_OR_DEPARTMENT_OTHER): Payer: Medicare HMO | Admitting: Oncology

## 2021-01-15 ENCOUNTER — Other Ambulatory Visit: Payer: Self-pay

## 2021-01-15 VITALS — HR 98

## 2021-01-15 VITALS — BP 127/85 | HR 108 | Temp 98.0°F | Resp 18 | Wt 196.4 lb

## 2021-01-15 DIAGNOSIS — C9000 Multiple myeloma not having achieved remission: Secondary | ICD-10-CM

## 2021-01-15 DIAGNOSIS — Z5112 Encounter for antineoplastic immunotherapy: Secondary | ICD-10-CM | POA: Diagnosis not present

## 2021-01-15 DIAGNOSIS — Z7951 Long term (current) use of inhaled steroids: Secondary | ICD-10-CM | POA: Diagnosis not present

## 2021-01-15 DIAGNOSIS — Z79899 Other long term (current) drug therapy: Secondary | ICD-10-CM | POA: Diagnosis not present

## 2021-01-15 DIAGNOSIS — Z7952 Long term (current) use of systemic steroids: Secondary | ICD-10-CM | POA: Diagnosis not present

## 2021-01-15 DIAGNOSIS — Z7982 Long term (current) use of aspirin: Secondary | ICD-10-CM | POA: Insufficient documentation

## 2021-01-15 DIAGNOSIS — Z5111 Encounter for antineoplastic chemotherapy: Secondary | ICD-10-CM

## 2021-01-15 DIAGNOSIS — D649 Anemia, unspecified: Secondary | ICD-10-CM

## 2021-01-15 LAB — COMPREHENSIVE METABOLIC PANEL
ALT: 19 U/L (ref 0–44)
AST: 18 U/L (ref 15–41)
Albumin: 3.7 g/dL (ref 3.5–5.0)
Alkaline Phosphatase: 107 U/L (ref 38–126)
Anion gap: 8 (ref 5–15)
BUN: 18 mg/dL (ref 8–23)
CO2: 28 mmol/L (ref 22–32)
Calcium: 9.3 mg/dL (ref 8.9–10.3)
Chloride: 100 mmol/L (ref 98–111)
Creatinine, Ser: 1.12 mg/dL — ABNORMAL HIGH (ref 0.44–1.00)
GFR, Estimated: 52 mL/min — ABNORMAL LOW (ref >60)
Glucose, Bld: 95 mg/dL (ref 70–99)
Potassium: 3.7 mmol/L (ref 3.5–5.1)
Sodium: 136 mmol/L (ref 135–145)
Total Bilirubin: 0.7 mg/dL (ref 0.3–1.2)
Total Protein: 6.2 g/dL — ABNORMAL LOW (ref 6.5–8.1)

## 2021-01-15 LAB — CBC WITH DIFFERENTIAL/PLATELET
Abs Immature Granulocytes: 0.1 10*3/uL — ABNORMAL HIGH (ref 0.00–0.07)
Basophils Absolute: 0.1 10*3/uL (ref 0.0–0.1)
Basophils Relative: 1 %
Eosinophils Absolute: 0.3 10*3/uL (ref 0.0–0.5)
Eosinophils Relative: 4 %
HCT: 40 % (ref 36.0–46.0)
Hemoglobin: 13.5 g/dL (ref 12.0–15.0)
Immature Granulocytes: 1 %
Lymphocytes Relative: 8 %
Lymphs Abs: 0.7 10*3/uL (ref 0.7–4.0)
MCH: 31.5 pg (ref 26.0–34.0)
MCHC: 33.8 g/dL (ref 30.0–36.0)
MCV: 93.5 fL (ref 80.0–100.0)
Monocytes Absolute: 0.4 10*3/uL (ref 0.1–1.0)
Monocytes Relative: 5 %
Neutro Abs: 6.8 10*3/uL (ref 1.7–7.7)
Neutrophils Relative %: 81 %
Platelets: 339 10*3/uL (ref 150–400)
RBC: 4.28 MIL/uL (ref 3.87–5.11)
RDW: 13.8 % (ref 11.5–15.5)
WBC: 8.5 10*3/uL (ref 4.0–10.5)
nRBC: 0 % (ref 0.0–0.2)

## 2021-01-15 MED ORDER — DENOSUMAB 120 MG/1.7ML ~~LOC~~ SOLN
120.0000 mg | SUBCUTANEOUS | Status: DC
  Administered 2021-01-15: 120 mg via SUBCUTANEOUS
  Filled 2021-01-15: qty 1.7

## 2021-01-15 MED ORDER — CYANOCOBALAMIN 1000 MCG/ML IJ SOLN
1000.0000 ug | Freq: Once | INTRAMUSCULAR | Status: AC
  Administered 2021-01-15: 1000 ug via INTRAMUSCULAR
  Filled 2021-01-15: qty 1

## 2021-01-15 MED ORDER — DIPHENHYDRAMINE HCL 25 MG PO CAPS
50.0000 mg | ORAL_CAPSULE | Freq: Once | ORAL | Status: AC
  Administered 2021-01-15: 50 mg via ORAL
  Filled 2021-01-15: qty 2

## 2021-01-15 MED ORDER — ONDANSETRON HCL 4 MG PO TABS
8.0000 mg | ORAL_TABLET | Freq: Once | ORAL | Status: AC
  Administered 2021-01-15: 8 mg via ORAL
  Filled 2021-01-15 (×2): qty 2

## 2021-01-15 MED ORDER — ACETAMINOPHEN 325 MG PO TABS
650.0000 mg | ORAL_TABLET | Freq: Once | ORAL | Status: AC
  Administered 2021-01-15: 650 mg via ORAL
  Filled 2021-01-15 (×2): qty 2

## 2021-01-15 MED ORDER — DARATUMUMAB-HYALURONIDASE-FIHJ 1800-30000 MG-UT/15ML ~~LOC~~ SOLN
1800.0000 mg | Freq: Once | SUBCUTANEOUS | Status: AC
  Administered 2021-01-15: 1800 mg via SUBCUTANEOUS
  Filled 2021-01-15: qty 15

## 2021-01-15 MED ORDER — DEXAMETHASONE 4 MG PO TABS
20.0000 mg | ORAL_TABLET | Freq: Once | ORAL | Status: AC
  Administered 2021-01-15: 20 mg via ORAL
  Filled 2021-01-15 (×2): qty 5

## 2021-01-15 NOTE — Progress Notes (Signed)
HR 108, per MD ok to treat.

## 2021-01-16 LAB — KAPPA/LAMBDA LIGHT CHAINS
Kappa free light chain: 6.3 mg/L (ref 3.3–19.4)
Kappa, lambda light chain ratio: 1.7 — ABNORMAL HIGH (ref 0.26–1.65)
Lambda free light chains: 3.7 mg/L — ABNORMAL LOW (ref 5.7–26.3)

## 2021-01-19 ENCOUNTER — Encounter: Payer: Self-pay | Admitting: Hematology and Oncology

## 2021-01-19 NOTE — Progress Notes (Signed)
Hematology/Oncology Consult note [facility]  Telephone:([phone] [phone]  Patient Care Team: Denton Lank, MD as PCP - General (Family Medicine)   Name of the patient: Laurie [name]  [id number]  01-03-1946   Date of visit: 01/19/21  Diagnosis- stage III biclonal IgA multiple myeloma  Chief complaint/ Reason for visit-routine follow-up of multiple myeloma for Darzalex Xgeva and B12  Heme/Onc history:   Patient is a 75 year old female who was diagnosed with stage III biclonal IgA multiple myeloma in June 2021. SPEP was 5.0 gm/dL on 10/03/2019. Random urine revealed 196.6 mg/dL total protein with 58.3% M-spike.  Bone marrow biopsy on 11/13/2019 revealed a hypercellular bone marrow with extensive involvement by plasma cell neoplasm. Atypical plasma cells represented 70% of all cells in the aspirate and was associated with prominent interstitial infiltrates and diffuse sheets in the clot.  Plasma cells were kappa light chain restricted.   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   Bone survey on 10/26/2019 revealed small lytic lesions in the skull. There was no acute fracture.   Patient has been on daratumumab Revlimid dexamethasone regimen since July 2021.  She briefly received Retacrit in the past but has not required it for over 1 year now.   Patient is a Sales promotion account executive [employer]   Interval history-patient reports doing well and is overall stable.  Denies any new complaints at this time.  Neuropathy remains stable.  ECOG PS- 1 Pain scale- 0   Review of systems- Review of Systems  Constitutional:  Positive for malaise/fatigue. Negative for chills, fever and weight loss.  HENT:   Negative for congestion, ear discharge and nosebleeds.   Eyes:  Negative for blurred vision.  Respiratory:  Negative for cough, hemoptysis, sputum production, shortness of breath and wheezing.   Cardiovascular:  Negative for chest pain, palpitations, orthopnea and claudication.  Gastrointestinal:  Negative for abdominal pain, blood in stool, constipation, diarrhea, heartburn, melena, nausea and vomiting.  Genitourinary:  Negative for dysuria, flank pain, frequency, hematuria and urgency.  Musculoskeletal:  Negative for back pain, joint pain and myalgias.  Skin:  Negative for rash.  Neurological:  Negative for dizziness, tingling, focal weakness, seizures, weakness and headaches.  Endo/Heme/Allergies:  Does not bruise/bleed easily.  Psychiatric/Behavioral:  Negative for depression and suicidal ideas. The patient does not have insomnia.    Allergies  Allergen Reactions   Levofloxacin     Critical    Other Other (See Comments)    Antihistamines     Past Medical History:  Diagnosis Date   Anxiety    Asthma    Depression    Head injury    Multiple myeloma ([place])    Osteoporosis      Past Surgical History:  Procedure Laterality Date   ABDOMINAL HYSTERECTOMY     HEMORROIDECTOMY     SEPTOPLASTY      Social History   Socioeconomic History   Marital status: Widowed    Spouse name: Not on file   Number of children: Not on file   Years of education: Not on file   Highest education level: Not on file  Occupational History   Not on file  Tobacco Use   Smoking status: Never   Smokeless tobacco: Never  Vaping  Use   Vaping Use: Not on file  Substance and Sexual Activity   Alcohol use: No   Drug use: No   Sexual activity: Not on file  Other Topics Concern   Not on file  Social History Narrative   Not on file   Social Determinants of Health   Financial Resource Strain: Not on file  Food Insecurity: Not on file  Transportation Needs: Not on file  Physical Activity: Not  on file  Stress: Not on file  Social Connections: Not on file  Intimate Partner Violence: Not on file    Family History  Problem Relation Age of Onset   Breast cancer Cousin        mat cousin     Current Outpatient Medications:    acyclovir (ZOVIRAX) 400 MG tablet, TAKE 1 TABLET BY MOUTH TWICE A DAY, Disp: 180 tablet, Rfl: 3   albuterol (VENTOLIN HFA) 108 (90 Base) MCG/ACT inhaler, 2 inhalations every 4 (four) hours as needed., Disp: , Rfl:    alendronate (FOSAMAX) 70 MG tablet, 70 mg once a week., Disp: , Rfl:    aspirin 81 MG chewable tablet, Chew by mouth daily., Disp: , Rfl:    atorvastatin (LIPITOR) 20 MG tablet, Take 20 mg by mouth daily., Disp: , Rfl:    busPIRone (BUSPAR) 10 MG tablet, Take 10 mg by mouth 2 (two) times daily., Disp: , Rfl:    Calcium Carb-Cholecalciferol (CALCIUM 600 + D PO), Take 600 mg by mouth 5 (five) times daily. Increase calcium from 659m 4 times a day to 6068m5 times a day starting today, Disp: , Rfl:    dexamethasone (DECADRON) 4 MG tablet, 5 TAB BY MOUTH ONCE A WEEK. 5 TAB WEEKLY THE DAY AFTER DARATUMUMAB X 8 WEEKS. TAKE WITH BREAKFAST., Disp: 40 tablet, Rfl: 0   docusate sodium (COLACE) 100 MG capsule, Take 100 mg by mouth daily., Disp: , Rfl:    lenalidomide (REVLIMID) 15 MG capsule, Take 1 capsule (15 mg total) by mouth daily. Take 1 tablet daily x 3 weeks and 1 week off    Authorization [id number]: 01/01/2021, Disp: 21 capsule, Rfl: 0   montelukast (SINGULAIR) 10 MG tablet, Take 10 mg by mouth daily after breakfast., Disp: , Rfl:    naproxen (NAPROSYN) 500 MG tablet, Take 500 mg by mouth 2 (two) times daily with a meal., Disp: , Rfl:    nortriptyline (PAMELOR) 25 MG capsule, Take by mouth daily. At night, Disp: , Rfl:    omeprazole (PRILOSEC) 20 MG capsule, Take 20 mg by mouth daily., Disp: , Rfl:    ondansetron (ZOFRAN) 8 MG tablet, Take 1 tablet (8 mg total) by mouth every 8 (eight) hours as needed (Nausea or vomiting)., Disp: 30 tablet,  Rfl: 1   sertraline (ZOLOFT) 100 MG tablet, Take 100 mg by mouth 2 (two) times daily., Disp: , Rfl:    traZODone (DESYREL) 100 MG tablet, Take 50 mg by mouth at bedtime., Disp: , Rfl:    Vitamin D, Ergocalciferol, (DRISDOL) 50000 UNITS CAPS capsule, Take 50,000 Units by mouth every 7 (seven) days. Once a month, Disp: , Rfl:    fluticasone-salmeterol (ADVAIR HFA) 230-21 MCG/ACT inhaler, Inhale into the lungs. (Patient not taking: No sig reported), Disp: , Rfl:   Physical exam:  Vitals:   01/15/21 0849  BP: 127/85  Pulse: (!) 108  Resp: 18  Temp: 98 F (36.7 C)  SpO2: 95%  Weight: 196 lb 6.9 oz (89.1 kg)  Physical Exam Constitutional:      Comments: Ambulates with a walker.  Appears in no acute distress  Cardiovascular:     Rate and Rhythm: Normal rate and regular rhythm.     Heart sounds: Normal heart sounds.  Pulmonary:     Effort: Pulmonary effort is normal.     Breath sounds: Normal breath sounds.  Abdominal:     General: Bowel sounds are normal.     Palpations: Abdomen is soft.  Skin:    General: Skin is warm and dry.  Neurological:     Mental Status: She is alert and oriented to person, place, and time.     CMP Latest Ref Rng & Units 01/15/2021  Glucose 70 - 99 mg/dL 95  BUN 8 - 23 mg/dL 18  Creatinine 0.44 - 1.00 mg/dL 1.12(H)  Sodium 135 - 145 mmol/L 136  Potassium 3.5 - 5.1 mmol/L 3.7  Chloride 98 - 111 mmol/L 100  CO2 22 - 32 mmol/L 28  Calcium 8.9 - 10.3 mg/dL 9.3  Total Protein 6.5 - 8.1 g/dL 6.2(L)  Total Bilirubin 0.3 - 1.2 mg/dL 0.7  Alkaline Phos 38 - 126 U/L 107  AST 15 - 41 U/L 18  ALT 0 - 44 U/L 19   CBC Latest Ref Rng & Units 01/15/2021  WBC 4.0 - 10.5 K/uL 8.5  Hemoglobin 12.0 - 15.0 g/dL 13.5  Hematocrit 36.0 - 46.0 % 40.0  Platelets 150 - 400 K/uL 339   Assessment and plan- Patient is a 75 y.o. female with stage III biclonal IgA kappa multiple myeloma here for routine follow-up  Counts okay to proceed with next dose of Darzalex today which  she is receiving on a monthly basis.  Also I have limited 3 weeks on and 1 week off.  She is on weekly Decadron.Overall her myeloma has responded well to treatment.  Her serum free light chain ratio is now 1.7 and her serum kappa light chain has normalized.  Myeloma panel is currently pending and the last level showed no M protein on SPEP  Patient will also receive B12 injection today.  She has potential dental work coming up soon and I will therefore hold off on giving her Delton See today.  I will see her back in 4 weeks with CBC with differential CMP myeloma panel and serum free light chains for Darzalex Xgeva and B12 injections   Visit Diagnosis 1. Multiple myeloma not having achieved remission ([place])   2. High risk medication use   3. Encounter for antineoplastic chemotherapy      Dr. Randa Evens, MD, MPH [facility] [phone] 01/19/2021 8:22 AM

## 2021-01-20 LAB — MULTIPLE MYELOMA PANEL, SERUM
Albumin SerPl Elph-Mcnc: 3.3 g/dL (ref 2.9–4.4)
Albumin/Glob SerPl: 1.7 (ref 0.7–1.7)
Alpha 1: 0.3 g/dL (ref 0.0–0.4)
Alpha2 Glob SerPl Elph-Mcnc: 0.7 g/dL (ref 0.4–1.0)
B-Globulin SerPl Elph-Mcnc: 0.8 g/dL (ref 0.7–1.3)
Gamma Glob SerPl Elph-Mcnc: 0.2 g/dL — ABNORMAL LOW (ref 0.4–1.8)
Globulin, Total: 2 g/dL — ABNORMAL LOW (ref 2.2–3.9)
IgA: 33 mg/dL — ABNORMAL LOW (ref 64–422)
IgG (Immunoglobin G), Serum: 185 mg/dL — ABNORMAL LOW (ref 586–1602)
IgM (Immunoglobulin M), Srm: 10 mg/dL — ABNORMAL LOW (ref 26–217)
M Protein SerPl Elph-Mcnc: 0.1 g/dL — ABNORMAL HIGH
Total Protein ELP: 5.3 g/dL — ABNORMAL LOW (ref 6.0–8.5)

## 2021-01-31 ENCOUNTER — Other Ambulatory Visit: Payer: Self-pay | Admitting: *Deleted

## 2021-01-31 MED ORDER — LENALIDOMIDE 15 MG PO CAPS
15.0000 mg | ORAL_CAPSULE | Freq: Every day | ORAL | 0 refills | Status: DC
Start: 2021-01-31 — End: 2021-02-28

## 2021-02-12 ENCOUNTER — Inpatient Hospital Stay: Payer: Medicare HMO

## 2021-02-12 ENCOUNTER — Inpatient Hospital Stay: Payer: Medicare HMO | Attending: Oncology

## 2021-02-12 ENCOUNTER — Encounter: Payer: Self-pay | Admitting: Oncology

## 2021-02-12 ENCOUNTER — Inpatient Hospital Stay (HOSPITAL_BASED_OUTPATIENT_CLINIC_OR_DEPARTMENT_OTHER): Payer: Medicare HMO | Admitting: Oncology

## 2021-02-12 VITALS — BP 147/73 | HR 85 | Resp 18

## 2021-02-12 VITALS — BP 133/81 | HR 96 | Temp 96.8°F | Resp 18 | Wt 201.1 lb

## 2021-02-12 DIAGNOSIS — Z79899 Other long term (current) drug therapy: Secondary | ICD-10-CM | POA: Diagnosis not present

## 2021-02-12 DIAGNOSIS — Z7982 Long term (current) use of aspirin: Secondary | ICD-10-CM | POA: Diagnosis not present

## 2021-02-12 DIAGNOSIS — Z803 Family history of malignant neoplasm of breast: Secondary | ICD-10-CM | POA: Insufficient documentation

## 2021-02-12 DIAGNOSIS — Z5111 Encounter for antineoplastic chemotherapy: Secondary | ICD-10-CM

## 2021-02-12 DIAGNOSIS — Z5112 Encounter for antineoplastic immunotherapy: Secondary | ICD-10-CM | POA: Insufficient documentation

## 2021-02-12 DIAGNOSIS — Z7951 Long term (current) use of inhaled steroids: Secondary | ICD-10-CM | POA: Diagnosis not present

## 2021-02-12 DIAGNOSIS — C9000 Multiple myeloma not having achieved remission: Secondary | ICD-10-CM

## 2021-02-12 DIAGNOSIS — Z7952 Long term (current) use of systemic steroids: Secondary | ICD-10-CM | POA: Insufficient documentation

## 2021-02-12 DIAGNOSIS — C9001 Multiple myeloma in remission: Secondary | ICD-10-CM | POA: Insufficient documentation

## 2021-02-12 DIAGNOSIS — Z5181 Encounter for therapeutic drug level monitoring: Secondary | ICD-10-CM

## 2021-02-12 DIAGNOSIS — D649 Anemia, unspecified: Secondary | ICD-10-CM

## 2021-02-12 LAB — CBC WITH DIFFERENTIAL/PLATELET
Abs Immature Granulocytes: 0.07 10*3/uL (ref 0.00–0.07)
Basophils Absolute: 0.1 10*3/uL (ref 0.0–0.1)
Basophils Relative: 1 %
Eosinophils Absolute: 0.6 10*3/uL — ABNORMAL HIGH (ref 0.0–0.5)
Eosinophils Relative: 7 %
HCT: 40.8 % (ref 36.0–46.0)
Hemoglobin: 13.3 g/dL (ref 12.0–15.0)
Immature Granulocytes: 1 %
Lymphocytes Relative: 9 %
Lymphs Abs: 0.8 10*3/uL (ref 0.7–4.0)
MCH: 30.9 pg (ref 26.0–34.0)
MCHC: 32.6 g/dL (ref 30.0–36.0)
MCV: 94.7 fL (ref 80.0–100.0)
Monocytes Absolute: 0.3 10*3/uL (ref 0.1–1.0)
Monocytes Relative: 4 %
Neutro Abs: 6.8 10*3/uL (ref 1.7–7.7)
Neutrophils Relative %: 78 %
Platelets: 300 10*3/uL (ref 150–400)
RBC: 4.31 MIL/uL (ref 3.87–5.11)
RDW: 14.2 % (ref 11.5–15.5)
WBC: 8.7 10*3/uL (ref 4.0–10.5)
nRBC: 0 % (ref 0.0–0.2)

## 2021-02-12 LAB — COMPREHENSIVE METABOLIC PANEL
ALT: 24 U/L (ref 0–44)
AST: 20 U/L (ref 15–41)
Albumin: 3.8 g/dL (ref 3.5–5.0)
Alkaline Phosphatase: 117 U/L (ref 38–126)
Anion gap: 8 (ref 5–15)
BUN: 20 mg/dL (ref 8–23)
CO2: 27 mmol/L (ref 22–32)
Calcium: 9 mg/dL (ref 8.9–10.3)
Chloride: 100 mmol/L (ref 98–111)
Creatinine, Ser: 1.14 mg/dL — ABNORMAL HIGH (ref 0.44–1.00)
GFR, Estimated: 51 mL/min — ABNORMAL LOW (ref >60)
Glucose, Bld: 95 mg/dL (ref 70–99)
Potassium: 3.8 mmol/L (ref 3.5–5.1)
Sodium: 135 mmol/L (ref 135–145)
Total Bilirubin: 0.5 mg/dL (ref 0.3–1.2)
Total Protein: 6.3 g/dL — ABNORMAL LOW (ref 6.5–8.1)

## 2021-02-12 MED ORDER — ACETAMINOPHEN 325 MG PO TABS
650.0000 mg | ORAL_TABLET | Freq: Once | ORAL | Status: AC
  Administered 2021-02-12: 650 mg via ORAL
  Filled 2021-02-12: qty 2

## 2021-02-12 MED ORDER — DEXAMETHASONE 4 MG PO TABS
20.0000 mg | ORAL_TABLET | Freq: Once | ORAL | Status: AC
  Administered 2021-02-12: 20 mg via ORAL
  Filled 2021-02-12: qty 5

## 2021-02-12 MED ORDER — DENOSUMAB 120 MG/1.7ML ~~LOC~~ SOLN
120.0000 mg | SUBCUTANEOUS | Status: DC
  Administered 2021-02-12: 120 mg via SUBCUTANEOUS
  Filled 2021-02-12: qty 1.7

## 2021-02-12 MED ORDER — DARATUMUMAB-HYALURONIDASE-FIHJ 1800-30000 MG-UT/15ML ~~LOC~~ SOLN
1800.0000 mg | Freq: Once | SUBCUTANEOUS | Status: AC
  Administered 2021-02-12: 1800 mg via SUBCUTANEOUS
  Filled 2021-02-12: qty 15

## 2021-02-12 MED ORDER — ONDANSETRON HCL 4 MG PO TABS
8.0000 mg | ORAL_TABLET | Freq: Once | ORAL | Status: AC
  Administered 2021-02-12: 8 mg via ORAL
  Filled 2021-02-12: qty 2

## 2021-02-12 MED ORDER — DIPHENHYDRAMINE HCL 25 MG PO CAPS
50.0000 mg | ORAL_CAPSULE | Freq: Once | ORAL | Status: AC
  Administered 2021-02-12: 50 mg via ORAL
  Filled 2021-02-12: qty 2

## 2021-02-12 MED ORDER — CYANOCOBALAMIN 1000 MCG/ML IJ SOLN
1000.0000 ug | Freq: Once | INTRAMUSCULAR | Status: AC
  Administered 2021-02-12: 1000 ug via INTRAMUSCULAR
  Filled 2021-02-12: qty 1

## 2021-02-12 NOTE — Progress Notes (Signed)
Hematology/Oncology Consult note [facility]  Telephone:([phone] [phone]  Patient Care Team: Denton Lank, MD as PCP - General (Family Medicine)   Name of the patient: Laurie [name]  [id number]  1945/08/11   Date of visit: 02/12/21  Diagnosis- stage III biclonal IgA multiple myeloma  Chief complaint/ Reason for visit-routine follow-up of multiple myeloma to receive Darzalex Xgeva and B12  Heme/Onc history: Patient is a 75 year old female who was diagnosed with stage III biclonal IgA multiple myeloma in June 2021. SPEP was 5.0 gm/dL on 10/03/2019. Random urine revealed 196.6 mg/dL total protein with 58.3% M-spike.  Bone marrow biopsy on 11/13/2019 revealed a hypercellular bone marrow with extensive involvement by plasma cell neoplasm. Atypical plasma cells represented 70% of all cells in the aspirate and was associated with prominent interstitial infiltrates and diffuse sheets in the clot.  Plasma cells were kappa light chain restricted.   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   Bone survey on 10/26/2019 revealed small lytic lesions in the skull. There was no acute fracture.   Patient has been on daratumumab Revlimid dexamethasone regimen since July 2021.  She briefly received Retacrit in the past but has not required it for over 1 year now.   Patient is a [employer] account executive [employer]    Interval history-patient is doing well and denies any specific complaints at this time.  She has some ongoing dental caries but has decided to hold off on any dental treatments at this time.  Denies any jaw pain or difficulty swallowing.  Neuropathy is stable  ECOG PS- 1 Pain scale- 0   Review of  systems- Review of Systems  Constitutional:  Negative for chills, fever, malaise/fatigue and weight loss.  HENT:  Negative for congestion, ear discharge and nosebleeds.   Eyes:  Negative for blurred vision.  Respiratory:  Negative for cough, hemoptysis, sputum production, shortness of breath and wheezing.   Cardiovascular:  Negative for chest pain, palpitations, orthopnea and claudication.  Gastrointestinal:  Negative for abdominal pain, blood in stool, constipation, diarrhea, heartburn, melena, nausea and vomiting.  Genitourinary:  Negative for dysuria, flank pain, frequency, hematuria and urgency.  Musculoskeletal:  Negative for back pain, joint pain and myalgias.  Skin:  Negative for rash.  Neurological:  Negative for dizziness, tingling, focal weakness, seizures, weakness and headaches.  Endo/Heme/Allergies:  Does not bruise/bleed easily.  Psychiatric/Behavioral:  Negative for depression and suicidal ideas. The patient does not have insomnia.      Allergies  Allergen Reactions   Levofloxacin     Critical    Other Other (See Comments)    Antihistamines     Past Medical History:  Diagnosis Date   Anxiety    Asthma    Depression    Head injury    Multiple myeloma ([place])    Osteoporosis      Past Surgical History:  Procedure Laterality Date   ABDOMINAL HYSTERECTOMY     HEMORROIDECTOMY     SEPTOPLASTY      Social History   Socioeconomic History   Marital status: Widowed    Spouse name: Not on file   Number of children: Not on file   Years of education: Not on file   Highest education level: Not on  file  Occupational History   Not on file  Tobacco Use   Smoking status: Never   Smokeless tobacco: Never  Vaping Use   Vaping Use: Not on file  Substance and Sexual Activity   Alcohol use: No   Drug use: No   Sexual activity: Not on file  Other Topics Concern   Not on file  Social History Narrative   Not on file   Social Determinants of Health   Financial  Resource Strain: Not on file  Food Insecurity: Not on file  Transportation Needs: Not on file  Physical Activity: Not on file  Stress: Not on file  Social Connections: Not on file  Intimate Partner Violence: Not on file    Family History  Problem Relation Age of Onset   Breast cancer Cousin        mat cousin     Current Outpatient Medications:    acyclovir (ZOVIRAX) 400 MG tablet, TAKE 1 TABLET BY MOUTH TWICE A DAY, Disp: 180 tablet, Rfl: 3   albuterol (VENTOLIN HFA) 108 (90 Base) MCG/ACT inhaler, 2 inhalations every 4 (four) hours as needed., Disp: , Rfl:    alendronate (FOSAMAX) 70 MG tablet, 70 mg once a week., Disp: , Rfl:    aspirin 81 MG chewable tablet, Chew by mouth daily., Disp: , Rfl:    atorvastatin (LIPITOR) 20 MG tablet, Take 20 mg by mouth daily., Disp: , Rfl:    busPIRone (BUSPAR) 10 MG tablet, Take 10 mg by mouth 2 (two) times daily., Disp: , Rfl:    Calcium Carb-Cholecalciferol (CALCIUM 600 + D PO), Take 600 mg by mouth 5 (five) times daily. Increase calcium from 677m 4 times a day to 6038m5 times a day starting today, Disp: , Rfl:    dexamethasone (DECADRON) 4 MG tablet, 5 TAB BY MOUTH ONCE A WEEK. 5 TAB WEEKLY THE DAY AFTER DARATUMUMAB X 8 WEEKS. TAKE WITH BREAKFAST., Disp: 40 tablet, Rfl: 0   docusate sodium (COLACE) 100 MG capsule, Take 100 mg by mouth daily., Disp: , Rfl:    lenalidomide (REVLIMID) 15 MG capsule, Take 1 capsule (15 mg total) by mouth daily. Take 1 tablet daily x 3 weeks and 1 week off    Authorization [id number]: 9/30//2022, Disp: 21 capsule, Rfl: 0   montelukast (SINGULAIR) 10 MG tablet, Take 10 mg by mouth daily after breakfast., Disp: , Rfl:    naproxen (NAPROSYN) 500 MG tablet, Take 500 mg by mouth 2 (two) times daily with a meal., Disp: , Rfl:    nortriptyline (PAMELOR) 25 MG capsule, Take by mouth daily. At night, Disp: , Rfl:    omeprazole (PRILOSEC) 20 MG capsule, Take 20 mg by mouth daily., Disp: , Rfl:    ondansetron (ZOFRAN)  8 MG tablet, Take 1 tablet (8 mg total) by mouth every 8 (eight) hours as needed (Nausea or vomiting)., Disp: 30 tablet, Rfl: 1   sertraline (ZOLOFT) 100 MG tablet, Take 100 mg by mouth 2 (two) times daily., Disp: , Rfl:    traZODone (DESYREL) 100 MG tablet, Take 50 mg by mouth at bedtime., Disp: , Rfl:    Vitamin D, Ergocalciferol, (DRISDOL) 50000 UNITS CAPS capsule, Take 50,000 Units by mouth every 7 (seven) days. Once a month, Disp: , Rfl:    fluticasone-salmeterol (ADVAIR HFA) 230-21 MCG/ACT inhaler, Inhale into the lungs. (Patient not taking: No sig reported), Disp: , Rfl:   Physical exam:  Vitals:   02/12/21 0839  BP: 133/81  Pulse:  96  Resp: 18  Temp: (!) 96.8 F (36 C)  SpO2: 96%  Weight: 201 lb 1.6 oz (91.2 kg)   Physical Exam HENT:     Mouth/Throat:     Mouth: Mucous membranes are moist.     Pharynx: Oropharynx is clear.     Comments: Few dental caries noted Cardiovascular:     Rate and Rhythm: Normal rate and regular rhythm.     Heart sounds: Normal heart sounds.  Pulmonary:     Effort: Pulmonary effort is normal.     Breath sounds: Normal breath sounds.  Skin:    General: Skin is warm and dry.  Neurological:     Mental Status: She is alert and oriented to person, place, and time.     CMP Latest Ref Rng & Units 02/12/2021  Glucose 70 - 99 mg/dL 95  BUN 8 - 23 mg/dL 20  Creatinine 0.44 - 1.00 mg/dL 1.14(H)  Sodium 135 - 145 mmol/L 135  Potassium 3.5 - 5.1 mmol/L 3.8  Chloride 98 - 111 mmol/L 100  CO2 22 - 32 mmol/L 27  Calcium 8.9 - 10.3 mg/dL 9.0  Total Protein 6.5 - 8.1 g/dL 6.3(L)  Total Bilirubin 0.3 - 1.2 mg/dL 0.5  Alkaline Phos 38 - 126 U/L 117  AST 15 - 41 U/L 20  ALT 0 - 44 U/L 24   CBC Latest Ref Rng & Units 02/12/2021  WBC 4.0 - 10.5 K/uL 8.7  Hemoglobin 12.0 - 15.0 g/dL 13.3  Hematocrit 36.0 - 46.0 % 40.8  Platelets 150 - 400 K/uL 300     Assessment and plan- Patient is a 75 y.o. female with stage III biclonal IgA kappa multiple myeloma  here for Darzalex Xgeva and B12  Counts okay to proceed with Darzalex today.  She started taking Revlimid about a week ago and is on 15 mg 3 weeks on and 1 week off.  I have asked her to start taking the next cycle coinciding with Darzalex.  She is also on weekly Decadron.  Continue aspirin and acyclovir prophylaxis.  Calcium levels okay to proceed with Xgeva today.  Patient will also receive her monthly B12 injection today.  Overall myeloma has been under good control with normalization of her serum free light chain ratio.  No evidence of hypercalcemia AKI or anemia.  Myeloma panel shows small M protein of 0.1 overall stable.  Continue to monitor   Visit Diagnosis 1. Encounter for antineoplastic chemotherapy   2. High risk medication use   3. Multiple myeloma in remission ([place])   4. Encounter for monitoring denosumab therapy      Dr. Randa Evens, MD, MPH [facility] [phone] 02/12/2021 9:03 AM

## 2021-02-12 NOTE — Patient Instructions (Signed)
[facility]COL[facility]   Discharge Instructions: Thank you for choosing [facility] to provide your oncology and hematology care.  If you have a lab appointment with the [facility], please go directly to the [facility] and check in at the registration area.  We strive to give you quality time with your provider. You may need to reschedule your appointment if you arrive late (15 or more minutes).  Arriving late affects you and other patients whose appointments are after yours.  Also, if you miss three or more appointments without notifying the office, you may be dismissed from the clinic at the provider's discretion.      For prescription refill requests, have your pharmacy contact our office and allow 72 hours for refills to be completed.    Today you received the following chemotherapy and/or immunotherapy agents: Darzalex Faspro. Today you also received the following: Xgeva and Vitamin B-12.      To help prevent nausea and vomiting after your treatment, we encourage you to take your nausea medication as directed.  BELOW ARE SYMPTOMS THAT SHOULD BE REPORTED IMMEDIATELY: *FEVER GREATER THAN 100.4 F (38 C) OR HIGHER *CHILLS OR SWEATING *NAUSEA AND VOMITING THAT IS NOT CONTROLLED WITH YOUR NAUSEA MEDICATION *UNUSUAL SHORTNESS OF BREATH *UNUSUAL BRUISING OR BLEEDING *URINARY PROBLEMS (pain or burning when urinating, or frequent urination) *BOWEL PROBLEMS (unusual diarrhea, constipation, pain near the anus) TENDERNESS IN MOUTH AND THROAT WITH OR WITHOUT PRESENCE OF ULCERS (sore throat, sores in mouth, or a toothache) UNUSUAL RASH, SWELLING OR PAIN  UNUSUAL VAGINAL DISCHARGE OR ITCHING   Items with * indicate a potential emergency and should be followed up as soon as possible or go to the Emergency Department if any problems should occur.  Please show the CHEMOTHERAPY ALERT CARD or IMMUNOTHERAPY ALERT CARD at check-in to the Emergency Department  and triage nurse.  Should you have questions after your visit or need to cancel or reschedule your appointment, please contact [facility]  [phone] and follow the prompts.  Office hours are 8:00 a.m. to 4:30 p.m. Monday - Friday. Please note that voicemails left after 4:00 p.m. may not be returned until the following business day.  We are closed weekends and major holidays. You have access to a nurse at all times for urgent questions. Please call the main number to the clinic [phone] and follow the prompts.  For any non-urgent questions, you may also contact your provider using MyChart. We now offer e-Visits for anyone 51 and older to request care online for non-urgent symptoms. For details visit [email or web address].   Also download the MyChart app! Go to the app store, search "MyChart", open the app, select [facility], and log in with your MyChart username and password.  Due to Covid, a mask is required upon entering the hospital/clinic. If you do not have a mask, one will be given to you upon arrival. For doctor visits, patients may have 1 support person aged 40 or older with them. For treatment visits, patients cannot have anyone with them due to current Covid guidelines and our immunocompromised population.

## 2021-02-13 LAB — KAPPA/LAMBDA LIGHT CHAINS
Kappa free light chain: 4.8 mg/L (ref 3.3–19.4)
Kappa, lambda light chain ratio: 1.78 — ABNORMAL HIGH (ref 0.26–1.65)
Lambda free light chains: 2.7 mg/L — ABNORMAL LOW (ref 5.7–26.3)

## 2021-02-17 LAB — MULTIPLE MYELOMA PANEL, SERUM
Albumin SerPl Elph-Mcnc: 3.4 g/dL (ref 2.9–4.4)
Albumin/Glob SerPl: 1.7 (ref 0.7–1.7)
Alpha 1: 0.3 g/dL (ref 0.0–0.4)
Alpha2 Glob SerPl Elph-Mcnc: 0.7 g/dL (ref 0.4–1.0)
B-Globulin SerPl Elph-Mcnc: 0.9 g/dL (ref 0.7–1.3)
Gamma Glob SerPl Elph-Mcnc: 0.1 g/dL — ABNORMAL LOW (ref 0.4–1.8)
Globulin, Total: 2.1 g/dL — ABNORMAL LOW (ref 2.2–3.9)
IgA: 32 mg/dL — ABNORMAL LOW (ref 64–422)
IgG (Immunoglobin G), Serum: 183 mg/dL — ABNORMAL LOW (ref 586–1602)
IgM (Immunoglobulin M), Srm: 11 mg/dL — ABNORMAL LOW (ref 26–217)
Total Protein ELP: 5.5 g/dL — ABNORMAL LOW (ref 6.0–8.5)

## 2021-02-28 ENCOUNTER — Other Ambulatory Visit: Payer: Self-pay | Admitting: *Deleted

## 2021-03-04 ENCOUNTER — Encounter: Payer: Self-pay | Admitting: Hematology and Oncology

## 2021-03-04 MED ORDER — LENALIDOMIDE 15 MG PO CAPS: 15.0000 mg | ORAL_CAPSULE | Freq: Every day | ORAL | 0 refills | Status: DC

## 2021-03-12 ENCOUNTER — Inpatient Hospital Stay: Payer: Medicare HMO | Attending: Oncology

## 2021-03-12 ENCOUNTER — Encounter: Payer: Self-pay | Admitting: Oncology

## 2021-03-12 ENCOUNTER — Other Ambulatory Visit: Payer: Self-pay

## 2021-03-12 ENCOUNTER — Inpatient Hospital Stay: Payer: Medicare HMO

## 2021-03-12 ENCOUNTER — Inpatient Hospital Stay (HOSPITAL_BASED_OUTPATIENT_CLINIC_OR_DEPARTMENT_OTHER): Payer: Medicare HMO | Admitting: Oncology

## 2021-03-12 VITALS — BP 135/71 | HR 99 | Temp 96.7°F | Resp 18 | Wt 196.9 lb

## 2021-03-12 VITALS — BP 154/82 | HR 100 | Resp 16

## 2021-03-12 DIAGNOSIS — C9000 Multiple myeloma not having achieved remission: Secondary | ICD-10-CM | POA: Insufficient documentation

## 2021-03-12 DIAGNOSIS — Z7982 Long term (current) use of aspirin: Secondary | ICD-10-CM | POA: Diagnosis not present

## 2021-03-12 DIAGNOSIS — J45909 Unspecified asthma, uncomplicated: Secondary | ICD-10-CM | POA: Diagnosis not present

## 2021-03-12 DIAGNOSIS — Z7951 Long term (current) use of inhaled steroids: Secondary | ICD-10-CM | POA: Insufficient documentation

## 2021-03-12 DIAGNOSIS — Z79899 Other long term (current) drug therapy: Secondary | ICD-10-CM

## 2021-03-12 DIAGNOSIS — Z5111 Encounter for antineoplastic chemotherapy: Secondary | ICD-10-CM

## 2021-03-12 DIAGNOSIS — Z5112 Encounter for antineoplastic immunotherapy: Secondary | ICD-10-CM | POA: Diagnosis not present

## 2021-03-12 DIAGNOSIS — D649 Anemia, unspecified: Secondary | ICD-10-CM

## 2021-03-12 DIAGNOSIS — Z7952 Long term (current) use of systemic steroids: Secondary | ICD-10-CM | POA: Insufficient documentation

## 2021-03-12 LAB — COMPREHENSIVE METABOLIC PANEL
ALT: 13 U/L (ref 0–44)
AST: 16 U/L (ref 15–41)
Albumin: 4.2 g/dL (ref 3.5–5.0)
Alkaline Phosphatase: 109 U/L (ref 38–126)
Anion gap: 9 (ref 5–15)
BUN: 20 mg/dL (ref 8–23)
CO2: 27 mmol/L (ref 22–32)
Calcium: 9.6 mg/dL (ref 8.9–10.3)
Chloride: 104 mmol/L (ref 98–111)
Creatinine, Ser: 0.97 mg/dL (ref 0.44–1.00)
GFR, Estimated: >60 mL/min (ref >60)
Glucose, Bld: 71 mg/dL (ref 70–99)
Potassium: 3.9 mmol/L (ref 3.5–5.1)
Sodium: 140 mmol/L (ref 135–145)
Total Bilirubin: 0.8 mg/dL (ref 0.3–1.2)
Total Protein: 6.9 g/dL (ref 6.5–8.1)

## 2021-03-12 LAB — CBC WITH DIFFERENTIAL/PLATELET
Abs Immature Granulocytes: 0.04 10*3/uL (ref 0.00–0.07)
Basophils Absolute: 0.1 10*3/uL (ref 0.0–0.1)
Basophils Relative: 1 %
Eosinophils Absolute: 0.3 10*3/uL (ref 0.0–0.5)
Eosinophils Relative: 4 %
HCT: 42.8 % (ref 36.0–46.0)
Hemoglobin: 14.2 g/dL (ref 12.0–15.0)
Immature Granulocytes: 0 %
Lymphocytes Relative: 11 %
Lymphs Abs: 1 10*3/uL (ref 0.7–4.0)
MCH: 31.7 pg (ref 26.0–34.0)
MCHC: 33.2 g/dL (ref 30.0–36.0)
MCV: 95.5 fL (ref 80.0–100.0)
Monocytes Absolute: 0.4 10*3/uL (ref 0.1–1.0)
Monocytes Relative: 5 %
Neutro Abs: 7.1 10*3/uL (ref 1.7–7.7)
Neutrophils Relative %: 79 %
Platelets: 393 10*3/uL (ref 150–400)
RBC: 4.48 MIL/uL (ref 3.87–5.11)
RDW: 14.4 % (ref 11.5–15.5)
WBC: 9 10*3/uL (ref 4.0–10.5)
nRBC: 0 % (ref 0.0–0.2)

## 2021-03-12 MED ORDER — SODIUM CHLORIDE 0.9% FLUSH
10.0000 mL | INTRAVENOUS | Status: DC | PRN
  Filled 2021-03-12: qty 10

## 2021-03-12 MED ORDER — CYANOCOBALAMIN 1000 MCG/ML IJ SOLN
1000.0000 ug | Freq: Once | INTRAMUSCULAR | Status: AC
  Administered 2021-03-12: 1000 ug via INTRAMUSCULAR
  Filled 2021-03-12: qty 1

## 2021-03-12 MED ORDER — DARATUMUMAB-HYALURONIDASE-FIHJ 1800-30000 MG-UT/15ML ~~LOC~~ SOLN
1800.0000 mg | Freq: Once | SUBCUTANEOUS | Status: AC
  Administered 2021-03-12: 1800 mg via SUBCUTANEOUS
  Filled 2021-03-12: qty 15

## 2021-03-12 MED ORDER — DEXAMETHASONE 4 MG PO TABS
20.0000 mg | ORAL_TABLET | Freq: Once | ORAL | Status: AC
  Administered 2021-03-12: 20 mg via ORAL
  Filled 2021-03-12: qty 5

## 2021-03-12 MED ORDER — DIPHENHYDRAMINE HCL 25 MG PO CAPS
50.0000 mg | ORAL_CAPSULE | Freq: Once | ORAL | Status: AC
  Administered 2021-03-12: 50 mg via ORAL
  Filled 2021-03-12: qty 2

## 2021-03-12 MED ORDER — ONDANSETRON HCL 4 MG PO TABS
8.0000 mg | ORAL_TABLET | Freq: Once | ORAL | Status: AC
  Administered 2021-03-12: 8 mg via ORAL
  Filled 2021-03-12: qty 2

## 2021-03-12 MED ORDER — DENOSUMAB 120 MG/1.7ML ~~LOC~~ SOLN
120.0000 mg | SUBCUTANEOUS | Status: DC
  Administered 2021-03-12: 120 mg via SUBCUTANEOUS
  Filled 2021-03-12: qty 1.7

## 2021-03-12 MED ORDER — ACETAMINOPHEN 325 MG PO TABS
650.0000 mg | ORAL_TABLET | Freq: Once | ORAL | Status: AC
  Administered 2021-03-12: 650 mg via ORAL
  Filled 2021-03-12: qty 2

## 2021-03-12 NOTE — Patient Instructions (Signed)
[facility]COL[facility]  Discharge Instructions: Thank you for choosing [facility] to provide your oncology and hematology care.  If you have a lab appointment with the [facility], please go directly to the [facility] and check in at the registration area.  Wear comfortable clothing and clothing appropriate for easy access to any Portacath or PICC line.   We strive to give you quality time with your provider. You may need to reschedule your appointment if you arrive late (15 or more minutes).  Arriving late affects you and other patients whose appointments are after yours.  Also, if you miss three or more appointments without notifying the office, you may be dismissed from the clinic at the provider's discretion.      For prescription refill requests, have your pharmacy contact our office and allow 72 hours for refills to be completed.    Today you received the following chemotherapy and/or immunotherapy agents - daratumumab      To help prevent nausea and vomiting after your treatment, we encourage you to take your nausea medication as directed.  BELOW ARE SYMPTOMS THAT SHOULD BE REPORTED IMMEDIATELY: *FEVER GREATER THAN 100.4 F (38 C) OR HIGHER *CHILLS OR SWEATING *NAUSEA AND VOMITING THAT IS NOT CONTROLLED WITH YOUR NAUSEA MEDICATION *UNUSUAL SHORTNESS OF BREATH *UNUSUAL BRUISING OR BLEEDING *URINARY PROBLEMS (pain or burning when urinating, or frequent urination) *BOWEL PROBLEMS (unusual diarrhea, constipation, pain near the anus) TENDERNESS IN MOUTH AND THROAT WITH OR WITHOUT PRESENCE OF ULCERS (sore throat, sores in mouth, or a toothache) UNUSUAL RASH, SWELLING OR PAIN  UNUSUAL VAGINAL DISCHARGE OR ITCHING   Items with * indicate a potential emergency and should be followed up as soon as possible or go to the Emergency Department if any problems should occur.  Please show the CHEMOTHERAPY ALERT CARD or IMMUNOTHERAPY ALERT CARD at  check-in to the Emergency Department and triage nurse.  Should you have questions after your visit or need to cancel or reschedule your appointment, please contact [facility]  [phone] and follow the prompts.  Office hours are 8:00 a.m. to 4:30 p.m. Monday - Friday. Please note that voicemails left after 4:00 p.m. may not be returned until the following business day.  We are closed weekends and major holidays. You have access to a nurse at all times for urgent questions. Please call the main number to the clinic [phone] and follow the prompts.  For any non-urgent questions, you may also contact your provider using MyChart. We now offer e-Visits for anyone 15 and older to request care online for non-urgent symptoms. For details visit [email or web address].   Also download the MyChart app! Go to the app store, search "MyChart", open the app, select [facility], and log in with your MyChart username and password.  Due to Covid, a mask is required upon entering the hospital/clinic. If you do not have a mask, one will be given to you upon arrival. For doctor visits, patients may have 1 support person aged 34 or older with them. For treatment visits, patients cannot have anyone with them due to current Covid guidelines and our immunocompromised population.   Daratumumab; Hyaluronidase Injection What is this medication? DARATUMUMAB; HYALURONIDASE (dar a toom ue mab / hye al ur ON i dase) is a monoclonal antibody. Hyaluronidase is used to improve the effects of daratumumab. It treats certain types of cancer. Some of the cancers treated are multiple myeloma and light-chain amyloidosis. This medicine may  be used for other purposes; ask your health care provider or pharmacist if you have questions. COMMON BRAND NAME(S): DARZALEX FASPRO What should I tell my care team before I take this medication? They need to know if you have any of these conditions: heart  disease infection especially a viral infection such as chickenpox, cold sores, herpes, or hepatitis B lung or breathing disease an unusual or allergic reaction to daratumumab, hyaluronidase, other medicines, foods, dyes, or preservatives pregnant or trying to get pregnant breast-feeding How should I use this medication? This medicine is for injection under the skin. It is given by a health care professional in a hospital or clinic setting. Talk to your pediatrician regarding the use of this medicine in children. Special care may be needed. Overdosage: If you think you have taken too much of this medicine contact a poison control center or emergency room at once. NOTE: This medicine is only for you. Do not share this medicine with others. What if I miss a dose? Keep appointments for follow-up doses as directed. It is important not to miss your dose. Call your doctor or health care professional if you are unable to keep an appointment. What may interact with this medication? Interactions have not been studied. This list may not describe all possible interactions. Give your health care provider a list of all the medicines, herbs, non-prescription drugs, or dietary supplements you use. Also tell them if you smoke, drink alcohol, or use illegal drugs. Some items may interact with your medicine. What should I watch for while using this medication? Your condition will be monitored carefully while you are receiving this medicine. This medicine can cause serious allergic reactions. To reduce your risk, your health care provider may give you other medicine to take before receiving this one. Be sure to follow the directions from your health care provider. This medicine can affect the results of blood tests to match your blood type. These changes can last for up to 6 months after the final dose. Your healthcare provider will do blood tests to match your blood type before you start treatment. Tell all of your  healthcare providers that you are being treated with this medicine before receiving a blood transfusion. This medicine can affect the results of some tests used to determine treatment response; extra tests may be needed to evaluate response. Do not become pregnant while taking this medicine or for 3 months after stopping it. Women should inform their health care provider if they wish to become pregnant or think they might be pregnant. There is a potential for serious side effects to an unborn child. Talk to your health care provider for more information. Do not breast-feed an infant while taking this medicine. What side effects may I notice from receiving this medication? Side effects that you should report to your care team as soon as possible: Allergic reactions--skin rash, itching or hives, swelling of the face, lips, or tongue Blood clot--chest pain, shortness of breath, pain, swelling or warmth in the leg Blurred vision Fast, irregular heartbeat Infection--fever, chills, cough, sore throat, pain or trouble passing urine Injection reactions--dizziness, fast heartbeat, feeling faint or lightheaded, falls, headache, increase in blood pressure, nausea, vomiting, or wheezing or trouble breathing with loud or whistling sounds Low red blood cell counts--trouble breathing, feeling faint, lightheaded or falls, unusually weak or tired Unusual bleeding or bruising Side effects that usually do not require medical attention (report these to your care team if they continue or are bothersome): Back pain  Constipation Diarrhea Pain, tingling, numbness in the hands or feet Pain, redness, or irritation at site where injected Muscle cramp or pain Swelling of the ankles, feet, hands Tiredness Trouble sleeping This list may not describe all possible side effects. Call your doctor for medical advice about side effects. You may report side effects to FDA at 1-800-FDA-1088. Where should I keep my  medication? This drug is given in a hospital or clinic and will not be stored at home. NOTE: This sheet is a summary. It may not cover all possible information. If you have questions about this medicine, talk to your doctor, pharmacist, or health care provider.  2022 Elsevier/Gold Standard (2021-01-07 00:00:00)

## 2021-03-12 NOTE — Progress Notes (Signed)
Pt states she forgot to take her medications this morning. She stating that becuase "Dr. Loletha Grayer advised her to take her meds before chemo." As well as, experiecing some pain at the top of her head at random times causing somewhat of a headache, and feeling more fatigue.

## 2021-03-12 NOTE — Progress Notes (Signed)
Hematology/Oncology Consult note [facility]  [phone] [phone]  Patient Care Team: Denton Lank, MD as PCP - General (Family Medicine)   Name of the patient: Laurie [name]  [id number]  [birth year]   Date of visit: 03/12/21  Diagnosis- stage III biclonal IgA multiple myeloma  Chief complaint/ Reason for visit-routine follow-up of multiple myeloma to receive Darzalex Xgeva and B12 injection  Heme/Onc history: Patient is a 75 year old female who was diagnosed with stage III biclonal IgA multiple myeloma in June 2021. SPEP was 5.0 gm/dL on 10/03/2019. Random urine revealed 196.6 mg/dL total protein with 58.3% M-spike.  Bone marrow biopsy on 11/13/2019 revealed a hypercellular bone marrow with extensive involvement by plasma cell neoplasm. Atypical plasma cells represented 70% of all cells in the aspirate and was associated with prominent interstitial infiltrates and diffuse sheets in the clot.  Plasma cells were kappa light chain restricted.   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   Bone survey on 10/26/2019 revealed small lytic lesions in the skull. There was no acute fracture.   Patient has been on daratumumab Revlimid dexamethasone regimen since July 2021.  She briefly received Retacrit in the past but has not required it for over 1 year now.   Patient is a Sales promotion account executive [employer]    Interval history-patient is tolerating Revlimid well without any significant side effects.  Denies any significant fatigue nausea vomiting or diarrhea.  Denies any new bone pains  ECOG PS- 1 Pain scale- 0 Opioid associated constipation- no   Review of systems- Review of Systems   Constitutional:  Negative for chills, fever, malaise/fatigue and weight loss.  HENT:  Negative for congestion, ear discharge and nosebleeds.   Eyes:  Negative for blurred vision.  Respiratory:  Negative for cough, hemoptysis, sputum production, shortness of breath and wheezing.   Cardiovascular:  Negative for chest pain, palpitations, orthopnea and claudication.  Gastrointestinal:  Negative for abdominal pain, blood in stool, constipation, diarrhea, heartburn, melena, nausea and vomiting.  Genitourinary:  Negative for dysuria, flank pain, frequency, hematuria and urgency.  Musculoskeletal:  Negative for back pain, joint pain and myalgias.  Skin:  Negative for rash.  Neurological:  Negative for dizziness, tingling, focal weakness, seizures, weakness and headaches.  Endo/Heme/Allergies:  Does not bruise/bleed easily.  Psychiatric/Behavioral:  Negative for depression and suicidal ideas. The patient does not have insomnia.     Allergies  Allergen Reactions   Levofloxacin     Critical    Other Other (See Comments)    Antihistamines     Past Medical History:  Diagnosis Date   Anxiety    Asthma    Depression    Head injury    Multiple myeloma ([place])    Osteoporosis      Past Surgical History:  Procedure Laterality Date   ABDOMINAL HYSTERECTOMY     HEMORROIDECTOMY     SEPTOPLASTY      Social History   Socioeconomic History   Marital status: Widowed    Spouse name: Not on file   Number of children: Not on file   Years of education: Not on file   Highest education level: Not on file  Occupational History   Not on file  Tobacco Use   Smoking  status: Never   Smokeless tobacco: Never  Vaping Use   Vaping Use: Not on file  Substance and Sexual Activity   Alcohol use: No   Drug use: No   Sexual activity: Not on file  Other Topics Concern   Not on file  Social History Narrative   Not on file   Social Determinants of Health   Financial Resource Strain: Not on file   Food Insecurity: Not on file  Transportation Needs: Not on file  Physical Activity: Not on file  Stress: Not on file  Social Connections: Not on file  Intimate Partner Violence: Not on file    Family History  Problem Relation Age of Onset   Breast cancer Cousin        mat cousin     Current Outpatient Medications:    acyclovir (ZOVIRAX) 400 MG tablet, TAKE 1 TABLET BY MOUTH TWICE A DAY, Disp: 180 tablet, Rfl: 3   albuterol (VENTOLIN HFA) 108 (90 Base) MCG/ACT inhaler, 2 inhalations every 4 (four) hours as needed., Disp: , Rfl:    alendronate (FOSAMAX) 70 MG tablet, 70 mg once a week., Disp: , Rfl:    aspirin 81 MG chewable tablet, Chew by mouth daily., Disp: , Rfl:    atorvastatin (LIPITOR) 20 MG tablet, Take 20 mg by mouth daily., Disp: , Rfl:    busPIRone (BUSPAR) 10 MG tablet, Take 10 mg by mouth 2 (two) times daily., Disp: , Rfl:    Calcium Carb-Cholecalciferol (CALCIUM 600 + D PO), Take 600 mg by mouth 5 (five) times daily. Increase calcium from $RemoveBefo'600mg'JxxldmHMaSX$  4 times a day to $Remo'600mg'KHaKm$  5 times a day starting today, Disp: , Rfl:    dexamethasone (DECADRON) 4 MG tablet, 5 TAB BY MOUTH ONCE A WEEK. 5 TAB WEEKLY THE DAY AFTER DARATUMUMAB X 8 WEEKS. TAKE WITH BREAKFAST., Disp: 40 tablet, Rfl: 0   docusate sodium (COLACE) 100 MG capsule, Take 100 mg by mouth daily., Disp: , Rfl:    lenalidomide (REVLIMID) 15 MG capsule, Take 1 capsule (15 mg total) by mouth daily. Take 1 tablet daily x 3 weeks and 1 week off    Authorization Number: [id number]  Date:03/04/2021, Disp: 21 capsule, Rfl: 0   montelukast (SINGULAIR) 10 MG tablet, Take 10 mg by mouth daily after breakfast., Disp: , Rfl:    naproxen (NAPROSYN) 500 MG tablet, Take 500 mg by mouth 2 (two) times daily with a meal., Disp: , Rfl:    nortriptyline (PAMELOR) 25 MG capsule, Take by mouth daily. At night, Disp: , Rfl:    omeprazole (PRILOSEC) 20 MG capsule, Take 20 mg by mouth daily., Disp: , Rfl:    sertraline (ZOLOFT) 100 MG tablet, Take 100 mg by  mouth 2 (two) times daily., Disp: , Rfl:    traZODone (DESYREL) 100 MG tablet, Take 50 mg by mouth at bedtime., Disp: , Rfl:    Vitamin D, Ergocalciferol, (DRISDOL) 50000 UNITS CAPS capsule, Take 50,000 Units by mouth every 7 (seven) days. Once a month, Disp: , Rfl:    fluticasone-salmeterol (ADVAIR HFA) 230-21 MCG/ACT inhaler, Inhale into the lungs. (Patient not taking: No sig reported), Disp: , Rfl:    ondansetron (ZOFRAN) 8 MG tablet, Take 1 tablet (8 mg total) by mouth every 8 (eight) hours as needed (Nausea or vomiting). (Patient not taking: Reported on 03/12/2021), Disp: 30 tablet, Rfl: 1 No current facility-administered medications for this visit.  Facility-Administered Medications Ordered in Other Visits:    denosumab (XGEVA) injection 120 mg, 120  mg, Subcutaneous, Q30 days, Earlie Server, MD, 120 mg at 03/12/21 1015   sodium chloride flush (NS) 0.9 % injection 10 mL, 10 mL, Intracatheter, PRN, Sindy Guadeloupe, MD  Physical exam:  Vitals:   03/12/21 0838  BP: 135/71  Pulse: 99  Resp: 18  Temp: (!) 96.7 F (35.9 C)  SpO2: 95%  Weight: 196 lb 14.4 oz (89.3 kg)   Physical Exam Constitutional:      General: She is not in acute distress. Cardiovascular:     Rate and Rhythm: Normal rate and regular rhythm.     Heart sounds: Normal heart sounds.  Pulmonary:     Effort: Pulmonary effort is normal.     Breath sounds: Normal breath sounds.  Skin:    General: Skin is warm and dry.  Neurological:     Mental Status: She is alert and oriented to person, place, and time.     CMP Latest Ref Rng & Units 03/12/2021  Glucose 70 - 99 mg/dL 71  BUN 8 - 23 mg/dL 20  Creatinine 0.44 - 1.00 mg/dL 0.97  Sodium 135 - 145 mmol/L 140  Potassium 3.5 - 5.1 mmol/L 3.9  Chloride 98 - 111 mmol/L 104  CO2 22 - 32 mmol/L 27  Calcium 8.9 - 10.3 mg/dL 9.6  Total Protein 6.5 - 8.1 g/dL 6.9  Total Bilirubin 0.3 - 1.2 mg/dL 0.8  Alkaline Phos 38 - 126 U/L 109  AST 15 - 41 U/L 16  ALT 0 - 44 U/L 13   CBC  Latest Ref Rng & Units 03/12/2021  WBC 4.0 - 10.5 K/uL 9.0  Hemoglobin 12.0 - 15.0 g/dL 14.2  Hematocrit 36.0 - 46.0 % 42.8  Platelets 150 - 400 K/uL 393    Assessment and plan- Patient is a 75 y.o. 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 She is here for next cycle of darzalex, xgeva and b12  Counts ok to proceed with darzalex today. She also gets xgeva and b12 injection. Continue revlimid 15 mg 3 weeks on and 1 week off.   Continue aspirin and acyclovir prophylaxis  I will see her in 4 weeks with labs and treatment and bone survey prior  Visit Diagnosis 1. Multiple myeloma not having achieved remission ([place])   2. Encounter for antineoplastic chemotherapy   3. High risk medication use      Dr. Randa Evens, MD, MPH [facility] [phone] 03/12/2021 12:28 PM

## 2021-03-13 LAB — KAPPA/LAMBDA LIGHT CHAINS
Kappa free light chain: 4.2 mg/L (ref 3.3–19.4)
Kappa, lambda light chain ratio: 1.56 (ref 0.26–1.65)
Lambda free light chains: 2.7 mg/L — ABNORMAL LOW (ref 5.7–26.3)

## 2021-03-17 LAB — MULTIPLE MYELOMA PANEL, SERUM
Albumin SerPl Elph-Mcnc: 3.6 g/dL (ref 2.9–4.4)
Albumin/Glob SerPl: 1.8 — ABNORMAL HIGH (ref 0.7–1.7)
Alpha 1: 0.3 g/dL (ref 0.0–0.4)
Alpha2 Glob SerPl Elph-Mcnc: 0.8 g/dL (ref 0.4–1.0)
B-Globulin SerPl Elph-Mcnc: 0.9 g/dL (ref 0.7–1.3)
Gamma Glob SerPl Elph-Mcnc: 0.2 g/dL — ABNORMAL LOW (ref 0.4–1.8)
Globulin, Total: 2.1 g/dL — ABNORMAL LOW (ref 2.2–3.9)
IgA: 38 mg/dL — ABNORMAL LOW (ref 64–422)
IgG (Immunoglobin G), Serum: 201 mg/dL — ABNORMAL LOW (ref 586–1602)
IgM (Immunoglobulin M), Srm: 17 mg/dL — ABNORMAL LOW (ref 26–217)
M Protein SerPl Elph-Mcnc: 0.1 g/dL — ABNORMAL HIGH
Total Protein ELP: 5.7 g/dL — ABNORMAL LOW (ref 6.0–8.5)

## 2021-03-20 ENCOUNTER — Ambulatory Visit
Admission: RE | Admit: 2021-03-20 | Discharge: 2021-03-20 | Disposition: A | Discharge status: Home / Self Care | Payer: Medicare HMO | Attending: Oncology | Admitting: Oncology

## 2021-03-20 ENCOUNTER — Other Ambulatory Visit: Payer: Self-pay

## 2021-03-20 ENCOUNTER — Ambulatory Visit
Admission: RE | Admit: 2021-03-20 | Discharge: 2021-03-20 | Disposition: A | Discharge status: Home / Self Care | Payer: Medicare HMO | Source: Ambulatory Visit | Attending: Oncology | Admitting: Oncology

## 2021-03-20 DIAGNOSIS — C9000 Multiple myeloma not having achieved remission: Secondary | ICD-10-CM | POA: Insufficient documentation

## 2021-03-26 ENCOUNTER — Telehealth: Payer: Self-pay | Admitting: *Deleted

## 2021-03-26 NOTE — Telephone Encounter (Signed)
Patient called reporting that her tongue is swollen worse today than yesterday. I see nothing noted in her chart about this so I asked her and she states that she has not said anything to [place] about it. She reports that she has had swelling under her chin and mouth sore for "a couple of days" and she has bee using salt water rinses and it feels better except that her tongue is swelling worse today. She said her pharmacy is open til 10 pm today if we want to order something for it. She does not know the cause of the swelling or soreness. She also mentioned that if needed she can go to the ER for it. Please advise

## 2021-03-30 ENCOUNTER — Emergency Department: Payer: Medicare HMO

## 2021-03-30 ENCOUNTER — Other Ambulatory Visit: Payer: Self-pay

## 2021-03-30 DIAGNOSIS — D849 Immunodeficiency, unspecified: Secondary | ICD-10-CM | POA: Diagnosis present

## 2021-03-30 DIAGNOSIS — C9000 Multiple myeloma not having achieved remission: Secondary | ICD-10-CM | POA: Diagnosis present

## 2021-03-30 DIAGNOSIS — M810 Age-related osteoporosis without current pathological fracture: Secondary | ICD-10-CM | POA: Diagnosis present

## 2021-03-30 DIAGNOSIS — K089 Disorder of teeth and supporting structures, unspecified: Secondary | ICD-10-CM | POA: Diagnosis present

## 2021-03-30 DIAGNOSIS — Z7983 Long term (current) use of bisphosphonates: Secondary | ICD-10-CM

## 2021-03-30 DIAGNOSIS — J45909 Unspecified asthma, uncomplicated: Secondary | ICD-10-CM | POA: Diagnosis present

## 2021-03-30 DIAGNOSIS — Z79899 Other long term (current) drug therapy: Secondary | ICD-10-CM

## 2021-03-30 DIAGNOSIS — K122 Cellulitis and abscess of mouth: Secondary | ICD-10-CM | POA: Diagnosis not present

## 2021-03-30 DIAGNOSIS — Z7982 Long term (current) use of aspirin: Secondary | ICD-10-CM

## 2021-03-30 DIAGNOSIS — Z888 Allergy status to other drugs, medicaments and biological substances status: Secondary | ICD-10-CM

## 2021-03-30 DIAGNOSIS — Z883 Allergy status to other anti-infective agents status: Secondary | ICD-10-CM

## 2021-03-30 DIAGNOSIS — F32A Depression, unspecified: Secondary | ICD-10-CM | POA: Diagnosis present

## 2021-03-30 DIAGNOSIS — Z23 Encounter for immunization: Secondary | ICD-10-CM

## 2021-03-30 DIAGNOSIS — Z20822 Contact with and (suspected) exposure to covid-19: Secondary | ICD-10-CM | POA: Diagnosis present

## 2021-03-30 LAB — CBC WITH DIFFERENTIAL/PLATELET
Abs Immature Granulocytes: 0.07 10*3/uL (ref 0.00–0.07)
Basophils Absolute: 0.1 10*3/uL (ref 0.0–0.1)
Basophils Relative: 1 %
Eosinophils Absolute: 0.3 10*3/uL (ref 0.0–0.5)
Eosinophils Relative: 4 %
HCT: 39.7 % (ref 36.0–46.0)
Hemoglobin: 13.4 g/dL (ref 12.0–15.0)
Immature Granulocytes: 1 %
Lymphocytes Relative: 8 %
Lymphs Abs: 0.5 10*3/uL — ABNORMAL LOW (ref 0.7–4.0)
MCH: 31.1 pg (ref 26.0–34.0)
MCHC: 33.8 g/dL (ref 30.0–36.0)
MCV: 92.1 fL (ref 80.0–100.0)
Monocytes Absolute: 0.8 10*3/uL (ref 0.1–1.0)
Monocytes Relative: 12 %
Neutro Abs: 5.1 10*3/uL (ref 1.7–7.7)
Neutrophils Relative %: 74 %
Platelets: 207 10*3/uL (ref 150–400)
RBC: 4.31 MIL/uL (ref 3.87–5.11)
RDW: 13.9 % (ref 11.5–15.5)
WBC: 6.8 10*3/uL (ref 4.0–10.5)
nRBC: 0 % (ref 0.0–0.2)

## 2021-03-30 LAB — BASIC METABOLIC PANEL
Anion gap: 10 (ref 5–15)
BUN: 16 mg/dL (ref 8–23)
CO2: 22 mmol/L (ref 22–32)
Calcium: 7.8 mg/dL — ABNORMAL LOW (ref 8.9–10.3)
Chloride: 104 mmol/L (ref 98–111)
Creatinine, Ser: 0.78 mg/dL (ref 0.44–1.00)
GFR, Estimated: >60 mL/min (ref >60)
Glucose, Bld: 105 mg/dL — ABNORMAL HIGH (ref 70–99)
Potassium: 3.4 mmol/L — ABNORMAL LOW (ref 3.5–5.1)
Sodium: 136 mmol/L (ref 135–145)

## 2021-03-30 MED ORDER — IOHEXOL 300 MG/ML  SOLN
75.0000 mL | Freq: Once | INTRAMUSCULAR | Status: AC | PRN
  Administered 2021-03-30: 23:00:00 75 mL via INTRAVENOUS

## 2021-03-30 NOTE — ED Triage Notes (Signed)
Pt states that the left side of her jaw has been hurting and she cannot open it all the way d/t multiple myeloma- pt feels like her jaw is misaligned- pt havign problems eating and drinking- pt is getting chemo treatment, last chemo was 10/9

## 2021-03-30 NOTE — ED Provider Notes (Signed)
  Emergency Medicine Provider Triage Evaluation Note  Laurie [name] , a 75 y.o.female,  was evaluated in triage.  Pt complains of jaw pain. Started approximately 1 week prior. Progressively getting worse. Rates it a 3/10.  Has a hx of multiple myeloma that she is currently undergoing chemo; last round was the 9th of last month.     Review of Systems  Positive: Jaw pain Negative: Denies fever, chest pain, vomiting  Physical Exam  There were no vitals filed for this visit. Gen:   Awake, no distress   Resp:  Normal effort  MSK:   Moves extremities without difficulty  Other:    Medical Decision Making  Given the patient's initial medical screening exam, the following diagnostic evaluation has been ordered. The patient will be placed in the appropriate treatment space, once one is available, to complete the evaluation and treatment. I have discussed the plan of care with the patient and I have advised the patient that an ED physician or mid-level practitioner will reevaluate their condition after the test results have been received, as the results may give them additional insight into the type of treatment they may need.    Diagnostics: CT soft tissue neck, CBC, BMP  Treatments: none immediately   Teodoro Spray, PA 03/30/21 1653    Nance Pear, MD 03/30/21 1818

## 2021-03-31 ENCOUNTER — Inpatient Hospital Stay: Payer: Medicare HMO

## 2021-03-31 ENCOUNTER — Inpatient Hospital Stay
Admission: EM | Admit: 2021-03-31 | Discharge: 2021-04-03 | DRG: 158 | Disposition: A | Discharge status: Home / Self Care | Payer: Medicare HMO | Attending: Internal Medicine | Admitting: Internal Medicine

## 2021-03-31 DIAGNOSIS — J45909 Unspecified asthma, uncomplicated: Secondary | ICD-10-CM | POA: Diagnosis present

## 2021-03-31 DIAGNOSIS — K089 Disorder of teeth and supporting structures, unspecified: Secondary | ICD-10-CM | POA: Diagnosis present

## 2021-03-31 DIAGNOSIS — Z20822 Contact with and (suspected) exposure to covid-19: Secondary | ICD-10-CM | POA: Diagnosis present

## 2021-03-31 DIAGNOSIS — K122 Cellulitis and abscess of mouth: Secondary | ICD-10-CM

## 2021-03-31 DIAGNOSIS — L0291 Cutaneous abscess, unspecified: Secondary | ICD-10-CM

## 2021-03-31 DIAGNOSIS — K140 Glossitis: Secondary | ICD-10-CM | POA: Diagnosis present

## 2021-03-31 DIAGNOSIS — Z883 Allergy status to other anti-infective agents status: Secondary | ICD-10-CM | POA: Diagnosis not present

## 2021-03-31 DIAGNOSIS — C9000 Multiple myeloma not having achieved remission: Secondary | ICD-10-CM | POA: Diagnosis present

## 2021-03-31 DIAGNOSIS — Z7983 Long term (current) use of bisphosphonates: Secondary | ICD-10-CM | POA: Diagnosis not present

## 2021-03-31 DIAGNOSIS — Z7982 Long term (current) use of aspirin: Secondary | ICD-10-CM | POA: Diagnosis not present

## 2021-03-31 DIAGNOSIS — Z79899 Other long term (current) drug therapy: Secondary | ICD-10-CM | POA: Diagnosis not present

## 2021-03-31 DIAGNOSIS — Z23 Encounter for immunization: Secondary | ICD-10-CM | POA: Diagnosis present

## 2021-03-31 DIAGNOSIS — M810 Age-related osteoporosis without current pathological fracture: Secondary | ICD-10-CM | POA: Diagnosis present

## 2021-03-31 DIAGNOSIS — F32A Depression, unspecified: Secondary | ICD-10-CM

## 2021-03-31 DIAGNOSIS — D849 Immunodeficiency, unspecified: Secondary | ICD-10-CM | POA: Diagnosis present

## 2021-03-31 DIAGNOSIS — Z5111 Encounter for antineoplastic chemotherapy: Secondary | ICD-10-CM

## 2021-03-31 DIAGNOSIS — J4520 Mild intermittent asthma, uncomplicated: Secondary | ICD-10-CM

## 2021-03-31 DIAGNOSIS — Z888 Allergy status to other drugs, medicaments and biological substances status: Secondary | ICD-10-CM | POA: Diagnosis not present

## 2021-03-31 HISTORY — DX: Cellulitis and abscess of mouth: K12.2

## 2021-03-31 LAB — RESP PANEL BY RT-PCR (FLU A&B, COVID) ARPGX2
Influenza A by PCR: NEGATIVE
Influenza B by PCR: NEGATIVE
SARS Coronavirus 2 by RT PCR: NEGATIVE

## 2021-03-31 LAB — CREATININE, SERUM
Creatinine, Ser: 0.81 mg/dL (ref 0.44–1.00)
GFR, Estimated: >60 mL/min (ref >60)

## 2021-03-31 LAB — PROTIME-INR
INR: 1.3 — ABNORMAL HIGH (ref 0.8–1.2)
Prothrombin Time: 15.9 seconds — ABNORMAL HIGH (ref 11.4–15.2)

## 2021-03-31 LAB — LACTIC ACID, PLASMA
Lactic Acid, Venous: 1 mmol/L (ref 0.5–1.9)
Lactic Acid, Venous: 1 mmol/L (ref 0.5–1.9)

## 2021-03-31 LAB — APTT: aPTT: 28 seconds (ref 24–36)

## 2021-03-31 MED ORDER — ONDANSETRON HCL 4 MG PO TABS: 4.0000 mg | ORAL_TABLET | Freq: Four times a day (QID) | ORAL | Status: DC | PRN

## 2021-03-31 MED ORDER — ACETAMINOPHEN 650 MG RE SUPP
650.0000 mg | Freq: Four times a day (QID) | RECTAL | Status: DC | PRN
  Filled 2021-03-31: qty 1

## 2021-03-31 MED ORDER — ACETAMINOPHEN 325 MG PO TABS: 650.0000 mg | ORAL_TABLET | Freq: Four times a day (QID) | ORAL | Status: DC | PRN

## 2021-03-31 MED ORDER — SODIUM CHLORIDE 0.9 % IV SOLN: INTRAVENOUS | Status: DC

## 2021-03-31 MED ORDER — VANCOMYCIN HCL IN DEXTROSE 1-5 GM/200ML-% IV SOLN
1000.0000 mg | INTRAVENOUS | Status: DC
  Filled 2021-03-31: qty 200

## 2021-03-31 MED ORDER — LACTATED RINGERS IV BOLUS (SEPSIS)
1000.0000 mL | Freq: Once | INTRAVENOUS | Status: AC
  Administered 2021-03-31: 04:00:00 1000 mL via INTRAVENOUS

## 2021-03-31 MED ORDER — VANCOMYCIN HCL 1000 MG/200ML IV SOLN
1000.0000 mg | INTRAVENOUS | Status: DC
  Administered 2021-04-01 – 2021-04-02 (×2): 1000 mg via INTRAVENOUS
  Filled 2021-03-31 (×3): qty 200

## 2021-03-31 MED ORDER — CEFEPIME HCL 2 G IJ SOLR: 2.0000 g | Freq: Three times a day (TID) | INTRAMUSCULAR | Status: DC

## 2021-03-31 MED ORDER — DEXAMETHASONE SODIUM PHOSPHATE 4 MG/ML IJ SOLN
4.0000 mg | Freq: Three times a day (TID) | INTRAMUSCULAR | Status: DC
  Administered 2021-03-31: 13:00:00 4 mg via INTRAVENOUS
  Filled 2021-03-31: qty 1

## 2021-03-31 MED ORDER — MORPHINE SULFATE (PF) 2 MG/ML IV SOLN: 2.0000 mg | INTRAVENOUS | Status: DC | PRN

## 2021-03-31 MED ORDER — SODIUM CHLORIDE 0.9 % IV SOLN: 1.5000 g | Freq: Four times a day (QID) | INTRAVENOUS | Status: DC

## 2021-03-31 MED ORDER — SODIUM CHLORIDE 0.9 % IV SOLN
2.0000 g | Freq: Once | INTRAVENOUS | Status: AC
  Administered 2021-03-31: 04:00:00 2 g via INTRAVENOUS
  Filled 2021-03-31: qty 2

## 2021-03-31 MED ORDER — ONDANSETRON HCL 4 MG/2ML IJ SOLN: 4.0000 mg | Freq: Four times a day (QID) | INTRAMUSCULAR | Status: DC | PRN

## 2021-03-31 MED ORDER — DEXAMETHASONE SODIUM PHOSPHATE 10 MG/ML IJ SOLN
10.0000 mg | Freq: Three times a day (TID) | INTRAMUSCULAR | Status: DC
  Administered 2021-04-01 – 2021-04-03 (×8): 10 mg via INTRAVENOUS
  Filled 2021-03-31 (×8): qty 1

## 2021-03-31 MED ORDER — SODIUM CHLORIDE 0.9 % IV SOLN
3.0000 g | Freq: Four times a day (QID) | INTRAVENOUS | Status: DC
  Administered 2021-03-31 – 2021-04-03 (×13): 3 g via INTRAVENOUS
  Filled 2021-03-31 (×2): qty 3
  Filled 2021-03-31: qty 8
  Filled 2021-03-31 (×2): qty 3
  Filled 2021-03-31: qty 8
  Filled 2021-03-31: qty 3
  Filled 2021-03-31: qty 8
  Filled 2021-03-31 (×2): qty 3
  Filled 2021-03-31 (×3): qty 8
  Filled 2021-03-31: qty 3
  Filled 2021-03-31: qty 8

## 2021-03-31 MED ORDER — VANCOMYCIN HCL IN DEXTROSE 1-5 GM/200ML-% IV SOLN: 1000.0000 mg | Freq: Once | INTRAVENOUS | Status: DC

## 2021-03-31 MED ORDER — DEXAMETHASONE SODIUM PHOSPHATE 10 MG/ML IJ SOLN
10.0000 mg | Freq: Once | INTRAMUSCULAR | Status: AC
  Administered 2021-03-31: 04:00:00 10 mg via INTRAVENOUS
  Filled 2021-03-31: qty 1

## 2021-03-31 MED ORDER — METRONIDAZOLE 500 MG/100ML IV SOLN: 500.0000 mg | Freq: Two times a day (BID) | INTRAVENOUS | Status: DC

## 2021-03-31 MED ORDER — METRONIDAZOLE 500 MG/100ML IV SOLN
500.0000 mg | Freq: Once | INTRAVENOUS | Status: AC
  Administered 2021-03-31: 04:00:00 500 mg via INTRAVENOUS
  Filled 2021-03-31: qty 100

## 2021-03-31 MED ORDER — VANCOMYCIN HCL 2000 MG/400ML IV SOLN
2000.0000 mg | Freq: Once | INTRAVENOUS | Status: AC
  Administered 2021-03-31: 05:00:00 2000 mg via INTRAVENOUS
  Filled 2021-03-31: qty 400

## 2021-03-31 NOTE — H&P (Signed)
History and Physical    Laurie [name] IRC:[id number] DOB: [birth year] DOA: 03/31/2021  PCP: Denton Lank, MD   Patient coming from: home  I have personally briefly reviewed patient's relevant medical records in [place]  Chief Complaint: pain in mouth  HPI: Laurie [name] is a 75 y.o. female with medical history significant for Asthma, multiple myeloma on maintenance chemotherapy who presents to the ED with a 1 week history of pain on the left side of her mouth and neck that has been gradually worsening though it is no difficulty opening her mouth or swallowing anything.  She denies prior dental cavities.  Denies fever or chills, sore throat or shortness of breath or difficulty breathing.  Denies nausea or vomiting.  The pain is severe and radiates to under her left jaw  ED course: Tachycardic at 110 with BP 155/74 and otherwise normal vitals Blood work: WBC 6.8 with lactic acid 1.0.  Labs mostly unremarkable  Imaging: CT soft tissue neck with contrast showing 4.5 cm fluid collection concerning for abscess within the tongue with significant surrounding edema  The ED provider spoke with ENT specialist, Dr. Sheppard Coil who recommended broad-spectrum antibiotics, Decadron and admission to the hospitalist service.  Patient was started on cefepime vancomycin and Flagyl.  Review of Systems: As per HPI otherwise all other systems on review of systems negative.      Physical Exam: Vitals:   03/30/21 1645 03/30/21 2155 03/31/21 0238 03/31/21 0240  BP: (!) 155/74 (!) 146/75 (!) 153/68   Pulse: (!) 110 (!) 107 (!) 101   Resp: _0 Temp: 98.7 F (37.1 C)     TempSrc: Oral     SpO2: 96% 96% 99% 99%  Weight:      Height:       Constitutional: Alert, oriented x 3 . Not in any apparent distress.  Speaking fluidly.  No drooling HEENT:      Head: Normocephalic and atraumatic.         Eyes: PERLA, EOMI, Conjunctivae are normal. Sclera is non-icteric.       Mouth/Throat:  Difficulty opening mouth     neck: Supple, tender at angle of left jaw with no signs of meningismus. Cardiovascular: Regular rate and rhythm. No murmurs, gallops, or rubs. 2+ symmetrical distal pulses are present . No JVD. No  LE edema Respiratory: Respiratory effort normal .Lungs sounds clear bilaterally. No wheezes, crackles, or rhonchi.  Gastrointestinal: Soft, non tender, non distended. Positive bowel sounds.  Genitourinary: No CVA tenderness. Musculoskeletal: Nontender with normal range of motion in all extremities. No cyanosis, or erythema of extremities. Neurologic:  Face is symmetric. Moving all extremities. No gross focal neurologic deficits . Skin: Skin is warm, dry.  No rash or ulcers Psychiatric: Mood and affect are appropriate     Principal Problem:   Abscess of tongue Active Problems:   Multiple myeloma not having achieved remission ([facility])   Maintenance chemotherapy   Asthma     Past Medical History:  Diagnosis Date   Anxiety    Asthma    Depression    Head injury    Multiple myeloma ([place])    Osteoporosis     Past Surgical History:  Procedure Laterality Date   ABDOMINAL HYSTERECTOMY     HEMORROIDECTOMY     SEPTOPLASTY       reports that she has never smoked. She has never used smokeless tobacco. She reports that she does not drink alcohol and does not use  drugs.  Allergies  Allergen Reactions   Levofloxacin     Critical    Other Other (See Comments)    Antihistamines    Family History  Problem Relation Age of Onset   Breast cancer Cousin        mat cousin      Prior to Admission medications   Medication Sig Start Date End Date Taking? Authorizing Provider  acyclovir (ZOVIRAX) 400 MG tablet TAKE 1 TABLET BY MOUTH TWICE A DAY 08/20/20   Corcoran, Drue Second, MD  albuterol (VENTOLIN HFA) 108 (90 Base) MCG/ACT inhaler 2 inhalations every 4 (four) hours as needed.    [provider]  alendronate (FOSAMAX) 70 MG tablet 70 mg once a week.  11/21/15   [provider]  aspirin 81 MG chewable tablet Chew by mouth daily.    [provider]  atorvastatin (LIPITOR) 20 MG tablet Take 20 mg by mouth daily.    [provider]  busPIRone (BUSPAR) 10 MG tablet Take 10 mg by mouth 2 (two) times daily.    [provider]  Calcium Carb-Cholecalciferol (CALCIUM 600 + D PO) Take 600 mg by mouth 5 (five) times daily. Increase calcium from 617m 4 times a day to 6016m5 times a day starting today 06/13/20   [provider]  dexamethasone (DECADRON) 4 MG tablet 5 TAB BY MOUTH ONCE A WEEK. 5 TAB WEEKLY THE DAY AFTER DARATUMUMAB X 8 WEEKS. TAKE WITH BREAKFAST. 08/20/20   CoLequita AsalMD  docusate sodium (COLACE) 100 MG capsule Take 100 mg by mouth daily.    [provider]  fluticasone-salmeterol (ADVAIR HFA) 230-21 MCG/ACT inhaler Inhale into the lungs. Patient not taking: No sig reported 01/28/17   [provider]  lenalidomide (REVLIMID) 15 MG capsule Take 1 capsule (15 mg total) by mouth daily. Take 1 tablet daily x 3 weeks and 1 week off    Authorization Number: [id number]:03/04/2021 03/04/21   RaSindy GuadeloupeMD  montelukast (SINGULAIR) 10 MG tablet Take 10 mg by mouth daily after breakfast.    [provider]  naproxen (NAPROSYN) 500 MG tablet Take 500 mg by mouth 2 (two) times daily with a meal.    [provider]  nortriptyline (PAMELOR) 25 MG capsule Take by mouth daily. At night 05/26/16   [provider]  omeprazole (PRILOSEC) 20 MG capsule Take 20 mg by mouth daily.    [provider]  ondansetron (ZOFRAN) 8 MG tablet Take 1 tablet (8 mg total) by mouth every 8 (eight) hours as needed (Nausea or vomiting). Patient not taking: Reported on 03/12/2021 03/13/20   CoLequita AsalMD  sertraline (ZOLOFT) 100 MG tablet Take 100 mg by mouth 2 (two) times daily.    [provider]  traZODone (DESYREL) 100 MG tablet Take 50 mg by mouth at  bedtime.    [provider]  Vitamin D, Ergocalciferol, (DRISDOL) 50000 UNITS CAPS capsule Take 50,000 Units by mouth every 7 (seven) days. Once a month    [provider]      Labs on Admission: I have personally reviewed following labs and imaging studies  CBC: Recent Labs  Lab 03/30/21 1646  WBC 6.8  NEUTROABS 5.1  HGB 13.4  HCT 39.7  MCV 92.1  PLT 20474 Basic Metabolic Panel: Recent Labs  Lab 03/30/21 1646  NA 136  K 3.4*  CL 104  CO2 22  GLUCOSE 105*  BUN 16  CREATININE  0.78  CALCIUM 7.8*   GFR: Estimated Creatinine Clearance: 66.8 mL/min (by C-G formula based on SCr of 0.78 mg/dL). Liver Function Tests: No results for input(s): AST, ALT, ALKPHOS, BILITOT, PROT, ALBUMIN in the last 168 hours. No results for input(s): LIPASE, AMYLASE in the last 168 hours. No results for input(s): AMMONIA in the last 168 hours. Coagulation Profile: Recent Labs  Lab 03/31/21 0329  INR 1.3*   Cardiac Enzymes: No results for input(s): CKTOTAL, CKMB, CKMBINDEX, TROPONINI in the last 168 hours. BNP (last 3 results) No results for input(s): PROBNP in the last 8760 hours. HbA1C: No results for input(s): HGBA1C in the last 72 hours. CBG: No results for input(s): GLUCAP in the last 168 hours. Lipid Profile: No results for input(s): CHOL, HDL, LDLCALC, TRIG, CHOLHDL, LDLDIRECT in the last 72 hours. Thyroid Function Tests: No results for input(s): TSH, T4TOTAL, FREET4, T3FREE, THYROIDAB in the last 72 hours. Anemia Panel: No results for input(s): VITAMINB12, FOLATE, FERRITIN, TIBC, IRON, RETICCTPCT in the last 72 hours. Urine analysis: No results found for: COLORURINE, APPEARANCEUR, LABSPEC, [place], GLUCOSEU, HGBUR, BILIRUBINUR, KETONESUR, PROTEINUR, UROBILINOGEN, NITRITE, LEUKOCYTESUR  Radiological Exams on Admission: CT Soft Tissue Neck W Contrast  Result Date: 03/30/2021 CLINICAL DATA:  Abscess?, left jaw pain EXAM: CT NECK WITH CONTRAST TECHNIQUE:  Multidetector CT imaging of the neck was performed using the standard protocol following the bolus administration of intravenous contrast. CONTRAST:  63m OMNIPAQUE IOHEXOL 300 MG/ML  SOLN COMPARISON:  None. FINDINGS: Pharynx and larynx: Within the tongue, there is a low-density collection with peripheral enhancement measuring 4.5 x 3.1 x 4.0 cm (AP x TR x CC) (series 2, image 42 and series 6, image 25), concerning for abscess. There is significant surrounding edema. The pharynx and larynx are otherwise normal. Salivary glands: No inflammation, mass, or stone. Thyroid: Normal. Lymph nodes: Prominent right 1B lymph node measuring up to 8 mm in short axis (series 2, image 42) no other enlarged or abnormal appearing lymph nodes. Vascular: Negative. Limited intracranial: Negative. Visualized orbits: Negative. Mastoids and visualized paranasal sinuses: Mucosal thickening in the right maxillary sinus. The mastoids are well aerated. Skeleton: No acute osseous abnormality. Some lytic lesions are seen in the inferior calvarium, consistent with the patient's diagnosis of multiple myeloma. Upper chest: No focal pulmonary opacity or pleural effusion. Other: None. IMPRESSION: Low-density collection within the tongue, with peripheral enhancement, measuring to 4.5 cm and concerning for abscess, with surrounding edema and possible reactive right level 1B lymph node. Electronically Signed   By: AMerilyn BabaM.D.   On: 03/30/2021 23:44     Assessment/Plan    Abscess of tongue -Continue cefepime vancomycin and Flagyl - IV Decadron - IV hydration, IV pain management - Continued monitoring for signs of airway compromise - ENT consulted from the ED and will see patient in the a.m. - SCD for DVT prophylaxis in case of procedure    [place]   Maintenance chemotherapy - Hold lenalidomide for now    Asthma - As needed DuoNeb    Depression - Holding oral meds for now   DVT  prophylaxis: SCDs Code Status: full code  Family Communication:  none  Disposition Plan: Back to previous home environment Consults called: ENT Status:At the time of admission, it appears that the appropriate admission status for this patient is INPATIENT. This is judged to be reasonable and necessary in order to provide the required intensity of service to ensure the patient's safety given the presenting  symptoms, physical exam findings, and initial radiographic and laboratory data in the context of their  Comorbid conditions.   Patient requires inpatient status due to high intensity of service, high risk for further deterioration and high frequency of surveillance required.   I certify that at the point of admission it is my clinical judgment that the patient will require inpatient hospital care spanning beyond [place] MD [facility]   03/31/2021, 4:11 AM

## 2021-03-31 NOTE — ED Notes (Signed)
Patient alert talking on phone at this time.

## 2021-03-31 NOTE — Progress Notes (Signed)
PROGRESS NOTE    Laurie [name]  IYM:[id number] DOB: Jul 24, 1945 DOA: 03/31/2021 PCP: Denton Lank, MD   Assessment & Plan:   Principal Problem:   Abscess of tongue Active Problems:   Multiple myeloma not having achieved remission ([place])   Maintenance chemotherapy   Asthma   Depression   Abscess of tongue: continue on IV unasyn, vanco. Continue on IV decadron. Continue on IVFs. Will go for I&D w/ cultures/cytology by IR as per ENT. ENT following and recs apprec.   Multiple myeloma: continue to hold home dose of lenalidomide for now. Immunosuppressed. Management per onco outpatient    Asthma: unknown stage and/or severity. Continue on bronchodilators. Encourage incentive spirometry    Depression: severity unknown. Continue to hold home dose of sertraline    DVT prophylaxis: SCDs as pt will likely go for procedure w/ IR  Code Status: full  Family Communication:  Disposition Plan: depends on PT/OT recs (not consulted yet)   Level of care: Telemetry Medical  Status is: Inpatient  Remains inpatient appropriate because: severity of illness, as stated above      Consultants:  ENT   Procedures:   Antimicrobials: unasyn, vanco  Subjective: Pt c/o tongue pain and swelling   Objective: Vitals:   03/31/21 0500 03/31/21 0730 03/31/21 0852 03/31/21 1300  BP: (!) 158/141 (!) 142/55 (!) 148/78 (!) 174/88  Pulse: 99 (!) 102 (!) 106 (!) 104  Resp: 20 (!) 23 16   Temp:   97.8 F (36.6 C)   TempSrc:      SpO2: 98% 96% 94% 99%  Weight:      Height:       No intake or output data in the 24 hours ending 03/31/21 1446 Filed Weights   03/30/21 1642  Weight: 83.5 kg    Examination:  General exam: Appears calm and comfortable  Respiratory system: Clear to auscultation. Respiratory effort normal. Cardiovascular system: S1 & S2 +. No rubs, gallops or clicks.  Gastrointestinal system: Abdomen is nondistended, soft and nontender. Normal bowel sounds heard. Central nervous  system: Alert and oriented. Moves all extremities  Psychiatry: Judgement and insight appear normal. Mood & affect appropriate.     Data Reviewed: I have personally reviewed following labs and imaging studies  CBC: Recent Labs  Lab 03/30/21 1646  WBC 6.8  NEUTROABS 5.1  HGB 13.4  HCT 39.7  MCV 92.1  PLT 768   Basic Metabolic Panel: Recent Labs  Lab 03/30/21 1646 03/31/21 1203  NA 136  --   K 3.4*  --   CL 104  --   CO2 22  --   GLUCOSE 105*  --   BUN 16  --   CREATININE 0.78 0.81  CALCIUM 7.8*  --    GFR: Estimated Creatinine Clearance: 66 mL/min (by C-G formula based on SCr of 0.81 mg/dL). Liver Function Tests: No results for input(s): AST, ALT, ALKPHOS, BILITOT, PROT, ALBUMIN in the last 168 hours. No results for input(s): LIPASE, AMYLASE in the last 168 hours. No results for input(s): AMMONIA in the last 168 hours. Coagulation Profile: Recent Labs  Lab 03/31/21 0329  INR 1.3*   Cardiac Enzymes: No results for input(s): CKTOTAL, CKMB, CKMBINDEX, TROPONINI in the last 168 hours. BNP (last 3 results) No results for input(s): PROBNP in the last 8760 hours. HbA1C: No results for input(s): HGBA1C in the last 72 hours. CBG: No results for input(s): GLUCAP in the last 168 hours. Lipid Profile: No results for input(s): CHOL, HDL, LDLCALC,  TRIG, CHOLHDL, LDLDIRECT in the last 72 hours. Thyroid Function Tests: No results for input(s): TSH, T4TOTAL, FREET4, T3FREE, THYROIDAB in the last 72 hours. Anemia Panel: No results for input(s): VITAMINB12, FOLATE, FERRITIN, TIBC, IRON, RETICCTPCT in the last 72 hours. Sepsis Labs: Recent Labs  Lab 03/31/21 0319 03/31/21 0702  LATICACIDVEN 1.0 1.0    Recent Results (from the past 240 hour(s))  Resp Panel by RT-PCR (Flu A&B, Covid) Nasopharyngeal Swab     Status: None   Collection Time: 03/31/21  3:19 AM   Specimen: Nasopharyngeal Swab; Nasopharyngeal(NP) swabs in vial transport medium  Result Value Ref Range Status    SARS Coronavirus 2 by RT PCR NEGATIVE NEGATIVE Final    Comment: (NOTE) SARS-CoV-2 target nucleic acids are NOT DETECTED.  The SARS-CoV-2 RNA is generally detectable in upper respiratory specimens during the acute phase of infection. The lowest concentration of SARS-CoV-2 viral copies this assay can detect is 138 copies/mL. A negative result does not preclude SARS-Cov-2 infection and should not be used as the sole basis for treatment or other patient management decisions. A negative result may occur with  improper specimen collection/handling, submission of specimen other than nasopharyngeal swab, presence of viral mutation(s) within the areas targeted by this assay, and inadequate number of viral copies(<138 copies/mL). A negative result must be combined with clinical observations, patient history, and epidemiological information. The expected result is Negative.  Fact Sheet for Patients:  EntrepreneurPulse.com.au  Fact Sheet for Healthcare Providers:  IncredibleEmployment.be  This test is no t yet approved or cleared by the [place] FDA and  has been authorized for detection and/or diagnosis of SARS-CoV-2 by FDA under an Emergency Use Authorization (EUA). This EUA will remain  in effect (meaning this test can be used) for the duration of the COVID-19 declaration under Section 564(b)(1) of the Act, 21 U.S.C.section 360bbb-3(b)(1), unless the authorization is terminated  or revoked sooner.       Influenza A by PCR NEGATIVE NEGATIVE Final   Influenza B by PCR NEGATIVE NEGATIVE Final    Comment: (NOTE) The Xpert Xpress SARS-CoV-2/FLU/RSV plus assay is intended as an aid in the diagnosis of influenza from Nasopharyngeal swab specimens and should not be used as a sole basis for treatment. Nasal washings and aspirates are unacceptable for Xpert Xpress SARS-CoV-2/FLU/RSV testing.  Fact Sheet for  Patients: EntrepreneurPulse.com.au  Fact Sheet for Healthcare Providers: IncredibleEmployment.be  This test is not yet approved or cleared by the [place] FDA and has been authorized for detection and/or diagnosis of SARS-CoV-2 by FDA under an Emergency Use Authorization (EUA). This EUA will remain in effect (meaning this test can be used) for the duration of the COVID-19 declaration under Section 564(b)(1) of the Act, 21 U.S.C. section 360bbb-3(b)(1), unless the authorization is terminated or revoked.  Performed at [facility], [address] [place], [place] [address]   Blood Culture (routine x 2)     Status: None (Preliminary result)   Collection Time: 03/31/21  3:20 AM   Specimen: BLOOD  Result Value Ref Range Status   Specimen Description BLOOD LEFT AC  Final   Special Requests   Final    BOTTLES DRAWN AEROBIC AND ANAEROBIC Blood Culture results may not be optimal due to an inadequate volume of blood received in culture bottles   Culture   Final    NO GROWTH < 12 HOURS Performed at [facility], [address] [place], [place] [address]    Report Status PENDING  Incomplete  Blood Culture (routine x 2)     Status: None (Preliminary result)   Collection Time: 03/31/21  3:20 AM   Specimen: BLOOD  Result Value Ref Range Status   Specimen Description BLOOD RIGHT AC  Final   Special Requests   Final    BOTTLES DRAWN AEROBIC AND ANAEROBIC Blood Culture adequate volume   Culture   Final    NO GROWTH < 12 HOURS Performed at [facility], [address], [place], [place] [address]    Report Status PENDING  Incomplete         Radiology Studies: CT Soft Tissue Neck W Contrast  Result Date: 03/30/2021 CLINICAL DATA:  Abscess?, left jaw pain EXAM: CT NECK WITH CONTRAST TECHNIQUE: Multidetector CT imaging of the neck was performed using the standard protocol following the bolus administration of  intravenous contrast. CONTRAST:  80m OMNIPAQUE IOHEXOL 300 MG/ML  SOLN COMPARISON:  None. FINDINGS: Pharynx and larynx: Within the tongue, there is a low-density collection with peripheral enhancement measuring 4.5 x 3.1 x 4.0 cm (AP x TR x CC) (series 2, image 42 and series 6, image 25), concerning for abscess. There is significant surrounding edema. The pharynx and larynx are otherwise normal. Salivary glands: No inflammation, mass, or stone. Thyroid: Normal. Lymph nodes: Prominent right 1B lymph node measuring up to 8 mm in short axis (series 2, image 42) no other enlarged or abnormal appearing lymph nodes. Vascular: Negative. Limited intracranial: Negative. Visualized orbits: Negative. Mastoids and visualized paranasal sinuses: Mucosal thickening in the right maxillary sinus. The mastoids are well aerated. Skeleton: No acute osseous abnormality. Some lytic lesions are seen in the inferior calvarium, consistent with the patient's diagnosis of multiple myeloma. Upper chest: No focal pulmonary opacity or pleural effusion. Other: None. IMPRESSION: Low-density collection within the tongue, with peripheral enhancement, measuring to 4.5 cm and concerning for abscess, with surrounding edema and possible reactive right level 1B lymph node. Electronically Signed   By: AMerilyn BabaM.D.   On: 03/30/2021 23:44        Scheduled Meds:  dexamethasone (DECADRON) injection  4 mg Intravenous Q8H   Continuous Infusions:  sodium chloride 75 mL/hr at 03/31/21 0510   ampicillin-sulbactam (UNASYN) IV     [START ON 04/01/2021] vancomycin       LOS: 0 days    Time spent: 30 mins     JWyvonnia Dusky MD [facility] Pager [phone]  If 7PM-7AM, please contact night-coverage 03/31/2021, 2:46 PM

## 2021-03-31 NOTE — Progress Notes (Signed)
Pharmacy Antibiotic Note  Laurie [name] is a 75 y.o. female admitted on 03/31/2021 with cellulitis.  Pharmacy has been consulted for Vancomycin, Cefepime dosing.  Plan: Cefepime 2 gm IV X 1 given in ED on 11/28 @ 0342.  Cefepime 2 gm IV Q8H ordered to continue on 11/28 @ 1200.   Vancomycin 2 gm IV X 1 given in ED on 11/28 @ ~ 0600. Vancomycin 1 gm IV Q24H ordered to continue on 11/29 @ 0600.   AUC = 468 Vanc trough = 10.4 mcg/mL   Height: 5\' 5"  (165.1 cm) Weight: 83.5 kg (184 lb) IBW/kg (Calculated) : 57  Temp (24hrs), Avg:98.7 F (37.1 C), Min:98.7 F (37.1 C), Max:98.7 F (37.1 C)  Recent Labs  Lab 03/30/21 1646 03/31/21 0319  WBC 6.8  --   CREATININE 0.78  --   LATICACIDVEN  --  1.0    Estimated Creatinine Clearance: 66.8 mL/min (by C-G formula based on SCr of 0.78 mg/dL).    Allergies  Allergen Reactions   Levofloxacin     Critical    Other Other (See Comments)    Antihistamines    Antimicrobials this admission:   >>    >>   Dose adjustments this admission:   Microbiology results:  BCx:   UCx:    Sputum:    MRSA PCR:   Thank you for allowing pharmacy to be a part of this patient's care.  Ailey Wessling D 03/31/2021 4:53 AM

## 2021-03-31 NOTE — ED Notes (Signed)
Pt reports swelling underneath tongue, denies pain, SHOB.

## 2021-03-31 NOTE — Consult Note (Signed)
Chief Complaint: Jaw pain. Request is for sublingual fluid collection aspiration.   Referring Physician(s): Dr. Gemma Payor  Supervising Physician: Juliet Rude  Patient Status: Laurie [name] - In-pt  History of Present Illness: Laurie [name] is a 75 y.o. female History of asthma, multiple myeloma. Presented to the ED with left sided jaw pain X 1 week.   CT soft tissue reads Low-density collection within the tongue, with peripheral enhancement, measuring to 4.5 cm and concerning for abscess, with surrounding edema and possible reactive right level 1B lymph node.Team is requesting aspiration of sublingual fluid collection.  Patient alert and laying in bed, calm and comfortable. Endorses left sided mouth and neck pain. Denies any fevers, headache, chest pain, SOB, cough, abdominal pain, nausea, vomiting or bleeding. Return precautions and treatment recommendations and follow-up discussed with the patient who is agreeable with the plan.    Past Medical History:  Diagnosis Date   Anxiety    Asthma    Depression    Head injury    Multiple myeloma ([place])    Osteoporosis     Past Surgical History:  Procedure Laterality Date   ABDOMINAL HYSTERECTOMY     HEMORROIDECTOMY     SEPTOPLASTY      Allergies: Levofloxacin and Other  Medications: Prior to Admission medications   Medication Sig Start Date End Date Taking? Authorizing Provider  Vitamin D, Ergocalciferol, (DRISDOL) 50000 UNITS CAPS capsule Take 50,000 Units by mouth every 7 (seven) days. Once a month   Yes [provider]  acyclovir (ZOVIRAX) 400 MG tablet TAKE 1 TABLET BY MOUTH TWICE A DAY 08/20/20   Corcoran, Drue Second, MD  albuterol (VENTOLIN HFA) 108 (90 Base) MCG/ACT inhaler 2 inhalations every 4 (four) hours as needed.    [provider]  alendronate (FOSAMAX) 70 MG tablet 70 mg once a week. 11/21/15   [provider]  aspirin 81 MG chewable tablet Chew by mouth daily.    [provider]  atorvastatin (LIPITOR) 20 MG tablet Take 20 mg by mouth daily.    [provider]  busPIRone (BUSPAR) 10 MG tablet Take 10 mg by mouth 2 (two) times daily.    [provider]  Calcium Carb-Cholecalciferol (CALCIUM 600 + D PO) Take 600 mg by mouth 5 (five) times daily. Increase calcium from 671m 4 times a day to 603m5 times a day starting today 06/13/20   [provider]  dexamethasone (DECADRON) 4 MG tablet 5 TAB BY MOUTH ONCE A WEEK. 5 TAB WEEKLY THE DAY AFTER DARATUMUMAB X 8 WEEKS. TAKE WITH BREAKFAST. 08/20/20   CoLequita AsalMD  docusate sodium (COLACE) 100 MG capsule Take 100 mg by mouth daily.    [provider]  fluticasone-salmeterol (ADVAIR HFA) 230-21 MCG/ACT inhaler Inhale into the lungs. Patient not taking: Reported on 09/18/2020 01/28/17   [provider]  lenalidomide (REVLIMID) 15 MG capsule Take 1 capsule (15 mg total) by mouth daily. Take 1 tablet daily x 3 weeks and 1 week off    Authorization Number: [id number]:03/04/2021 03/04/21   RaSindy GuadeloupeMD  montelukast (SINGULAIR) 10 MG tablet Take 10 mg by mouth daily after breakfast.    [provider]  naproxen (NAPROSYN) 500 MG tablet Take 500 mg by mouth 2 (two) times daily with a meal.    [provider]  nortriptyline (PAMELOR) 25 MG capsule Take 25 mg by mouth at bedtime. 03/17/21   [provider]  omeprazole ([place]  20 MG capsule Take 20 mg by mouth daily.    [provider]  ondansetron (ZOFRAN) 8 MG tablet Take 1 tablet (8 mg total) by mouth every 8 (eight) hours as needed (Nausea or vomiting). Patient not taking: Reported on 03/12/2021 03/13/20   Lequita Asal, MD  sertraline (ZOLOFT) 100 MG tablet Take 100 mg by mouth 2 (two) times daily.    [provider]  traZODone (DESYREL) 50 MG tablet Take 50 mg by mouth at bedtime. 03/24/21   [provider]     Family History  Problem Relation Age of Onset    Breast cancer Cousin        mat cousin    Social History   Socioeconomic History   Marital status: Widowed    Spouse name: Not on file   Number of children: Not on file   Years of education: Not on file   Highest education level: Not on file  Occupational History   Not on file  Tobacco Use   Smoking status: Never   Smokeless tobacco: Never  Vaping Use   Vaping Use: Not on file  Substance and Sexual Activity   Alcohol use: No   Drug use: No   Sexual activity: Not on file  Other Topics Concern   Not on file  Social History Narrative   Not on file   Social Determinants of Health   Financial Resource Strain: Not on file  Food Insecurity: Not on file  Transportation Needs: Not on file  Physical Activity: Not on file  Stress: Not on file  Social Connections: Not on file    Review of Systems: A 12 point ROS discussed and pertinent positives are indicated in the HPI above.  All other systems are negative.  Review of Systems  Constitutional:  Negative for fatigue and fever.  HENT:  Negative for congestion.        Left sided neck and mouth pain.   Respiratory:  Negative for cough and shortness of breath.   Gastrointestinal:  Negative for abdominal pain, diarrhea, nausea and vomiting.   Vital Signs: BP (!) 148/78   Pulse (!) 106   Temp 97.8 F (36.6 C)   Resp 16   Ht _0  (1.651 m)   Wt 184 lb (83.5 kg)   SpO2 94%   BMI 30.62 kg/m   Physical Exam Vitals and nursing note reviewed.  Constitutional:      Appearance: She is well-developed.  HENT:     Head: Normocephalic and atraumatic.  Eyes:     Conjunctiva/sclera: Conjunctivae normal.  Pulmonary:     Effort: Pulmonary effort is normal.  Musculoskeletal:        General: Normal range of motion.     Cervical back: Normal range of motion.  Skin:    General: Skin is warm.  Neurological:     Mental Status: She is alert and oriented to person, place, and time.    Imaging: CT Soft Tissue Neck W  Contrast  Result Date: 03/30/2021 CLINICAL DATA:  Abscess?, left jaw pain EXAM: CT NECK WITH CONTRAST TECHNIQUE: Multidetector CT imaging of the neck was performed using the standard protocol following the bolus administration of intravenous contrast. CONTRAST:  64m OMNIPAQUE IOHEXOL 300 MG/ML  SOLN COMPARISON:  None. FINDINGS: Pharynx and larynx: Within the tongue, there is a low-density collection with peripheral enhancement measuring 4.5 x 3.1 x 4.0 cm (AP x TR x CC) (series 2, image 42 and series 6, image 25),  concerning for abscess. There is significant surrounding edema. The pharynx and larynx are otherwise normal. Salivary glands: No inflammation, mass, or stone. Thyroid: Normal. Lymph nodes: Prominent right 1B lymph node measuring up to 8 mm in short axis (series 2, image 42) no other enlarged or abnormal appearing lymph nodes. Vascular: Negative. Limited intracranial: Negative. Visualized orbits: Negative. Mastoids and visualized paranasal sinuses: Mucosal thickening in the right maxillary sinus. The mastoids are well aerated. Skeleton: No acute osseous abnormality. Some lytic lesions are seen in the inferior calvarium, consistent with the patient's diagnosis of multiple myeloma. Upper chest: No focal pulmonary opacity or pleural effusion. Other: None. IMPRESSION: Low-density collection within the tongue, with peripheral enhancement, measuring to 4.5 cm and concerning for abscess, with surrounding edema and possible reactive right level 1B lymph node. Electronically Signed   By: Merilyn Baba M.D.   On: 03/30/2021 23:44   DG Bone Survey Met  Result Date: 03/21/2021 CLINICAL DATA:  Multiple myeloma EXAM: METASTATIC BONE SURVEY COMPARISON:  09/25/2020 FINDINGS: Multiple lytic lesions are seen throughout the calvarium which appear grossly stable in size and number since prior examination. Mild degenerative changes are seen within the cervical, thoracic, lumbar spine. Vascular calcifications over  within the abdominal aorta. Degenerative changes noted within the acromioclavicular articulation bilaterally. Mild-to-moderate right and minimal left knee degenerative arthritis noted. IMPRESSION: Stable lytic lesions within the calvarium in keeping with the given history of multiple myeloma. No additional lytic lesions identified. Electronically Signed   By: Fidela Salisbury M.D.   On: 03/21/2021 20:36    Labs:  CBC: Recent Labs    01/15/21 0841 02/12/21 0814 03/12/21 0808 03/30/21 1646  WBC 8.5 8.7 9.0 6.8  HGB 13.5 13.3 14.2 13.4  HCT 40.0 40.8 42.8 39.7  PLT 339 300 393 207    COAGS: Recent Labs    03/31/21 0329  INR 1.3*  APTT 28    BMP: Recent Labs    01/15/21 0841 02/12/21 0814 03/12/21 0808 03/30/21 1646 03/31/21 1203  NA 136 135 140 136  --   K 3.7 3.8 3.9 3.4*  --   CL 100 100 104 104  --   CO2 _0 --   GLUCOSE 95 95 71 105*  --   BUN _1 --   CALCIUM 9.3 9.0 9.6 7.8*  --   CREATININE 1.12* 1.14* 0.97 0.78 0.81  GFRNONAA 52* 51* >60 >60 >60    LIVER FUNCTION TESTS: Recent Labs    12/11/20 0853 01/15/21 0841 02/12/21 0814 03/12/21 0808  BILITOT 0.9 0.7 0.5 0.8  AST _2 ALT _3 ALKPHOS 134* 107 117 109  PROT 6.7 6.2* 6.3* 6.9  ALBUMIN 4.0 3.7 3.8 4.2      Assessment and Plan:  75 y.o female inpatient ( in ED). History of asthma, multiple myeloma. Presented to the ED with left sided jaw pain X 1 week.   CT soft tissue reads Low-density collection within the tongue, with peripheral enhancement, measuring to 4.5 cm and concerning for abscess, with surrounding edema and possible reactive right level 1B lymph node.Team is requesting aspiration of sublingual fluid collection.  All labs and medications are within acceptable parameters. Patient tachycardic upon arrival. afebrile.  INR 1.3 WBC is WNL. No pertinent allergies.   Risks and benefits discussed with the patient including bleeding, infection, damage to  adjacent structures, bowel perforation/fistula connection, and sepsis.  All of the patient's questions were  answered, patient is agreeable to proceed. Consent signed and in [place] control room.   Thank you for this interesting consult.  I greatly enjoyed meeting Laurie [name] and look forward to participating in their care.  A copy of this report was sent to the requesting provider on this date.  Electronically Signed: Jacqualine Mau, NP 03/31/2021, 1:41 PM   I spent a total of 40 Minutes    in face to face in clinical consultation, greater than 50% of which was counseling/coordinating care for sublingual fluid collection aspiration.

## 2021-03-31 NOTE — Procedures (Signed)
Interventional Radiology Procedure Note  Date of Procedure: 03/31/2021  Procedure: US guided aspiration of sublingual abscess   Findings:  1. US showed complex and septated fluid collection in the sublingual region  2. US guided aspiration yielded only 2 ml of purulent material; unable to aspirate surther due to think and septated nature of the collection   Complications: No immediate complications noted.   Estimated Blood Loss: minimal  Follow-up and Recommendations: 1. Defer to ENT   Albin Felling, MD  Vascular & Interventional Radiology  03/31/2021 3:21 PM

## 2021-03-31 NOTE — Consult Note (Signed)
Laurie [name], Laurie [name] [id number] 1945-10-20 [name], MD  Reason for Consult: Neck abscess  HPI: 75 year old female with a history of multiple myeloma who last Wednesday began having mild sore throat.  It progressed over the next for 5 days to where she had trouble with swallowing and tongue soreness.  She presented to the emergency room for evaluation.  CT scan obtained showed what appears to be a floor mouth abscess with fluid collection.  Allergies:  Allergies  Allergen Reactions   Levofloxacin     Critical    Other Other (See Comments)    Antihistamines    ROS: Review of systems normal other than 12 systems except per HPI.  PMH:  Past Medical History:  Diagnosis Date   Anxiety    Asthma    Depression    Head injury    Multiple myeloma ([place])    Osteoporosis     FH:  Family History  Problem Relation Age of Onset   Breast cancer Cousin        mat cousin    SH:  Social History   Socioeconomic History   Marital status: Widowed    Spouse name: Not on file   Number of children: Not on file   Years of education: Not on file   Highest education level: Not on file  Occupational History   Not on file  Tobacco Use   Smoking status: Never   Smokeless tobacco: Never  Vaping Use   Vaping Use: Not on file  Substance and Sexual Activity   Alcohol use: No   Drug use: No   Sexual activity: Not on file  Other Topics Concern   Not on file  Social History Narrative   Not on file   Social Determinants of Health   Financial Resource Strain: Not on file  Food Insecurity: Not on file  Transportation Needs: Not on file  Physical Activity: Not on file  Stress: Not on file  Social Connections: Not on file  Intimate Partner Violence: Not on file    PSH:  Past Surgical History:  Procedure Laterality Date   ABDOMINAL HYSTERECTOMY     HEMORROIDECTOMY     SEPTOPLASTY      Physical  Exam: Patient is awake and alert in no apparent distress.  The external ears  appeared normal the anterior nose is patent the oral cavity oropharynx shows minimal floor mouth swelling the tongue is midline and there is no significant tongue edema.  Patient has very poor dentition with multiple broken infected teeth.  Palpation the neck some anterior submental tenderness.  Procedure: Flexible fiberoptic laryngoscopy-a topical anesthetic of phenylephrine lidocaine solution was placed within each nostril approximately 1 and half cc on each side.  A flexible fiberoptic laryngoscope was introduced into the nasal cavity examination the nasopharynx the tongue base epiglottis and larynx were all within normal limits.  There is no evidence of airway obstruction.  Review of CT scan shows what appears to be a fluid collection consistent with abscess.   A/P: Floor mouth swelling with what appears to be submental intramuscular collection consistent with an abscess.  I have spoken with interventional radiology we will plan to do an image guided aspiration and drainage of the abscess.  They will send for culture as well as cytology.  The airway is widely patent and is not in any danger at this point.  We will continue to follow.  She will need an outpatient evaluation by dentistry for removal of these infected  teeth.  Emergency room time 1 hour   [name] 03/31/2021 12:46 PM

## 2021-03-31 NOTE — ED Provider Notes (Signed)
[facility] Emergency Department Provider Note  ____________________________________________   Event Date/Time   First MD Initiated Contact with Patient 03/31/21 [phone]     (approximate)  I have reviewed the triage vital signs and the nursing notes.   HISTORY  Chief Complaint Jaw Pain    HPI Laurie [name] is a 75 y.o. female whose medical history is notable for multiple myeloma on chemotherapy with monthly infusions and a daily "chemo pill".  Her oncologist is Dr. Janese Banks.  She presents for gradually worsening pain in the left side of her mouth and neck over the course of the last week.  It is gotten severe over the last 2 days and she has difficulty opening her mouth and swallowing anything.  She has not had pills for 1 to 2 days because of the difficulty with swallowing.  Her voice is changed a little bit as well.  She has chronic dental disease but no specific dental pain or abscess of which she is aware.  She denies fever, sore throat, chest pain, shortness of breath, nausea, vomiting, and abdominal pain.  Nothing in particular makes the symptoms better or worse.     Past Medical History:  Diagnosis Date   Anxiety    Asthma    Depression    Head injury    Multiple myeloma ([place])    Osteoporosis     Patient Active Problem List   Diagnosis Date Noted   Hypocalcemia 03/13/2020   Chemotherapy-induced neutropenia ([place]) 01/28/2020   Encounter for antineoplastic immunotherapy 12/03/2019   Multiple myeloma not having achieved remission ([place]) 11/23/2019   Anemia 11/23/2019   Lytic bone lesions on xray 11/13/2019   Goals of care, counseling/discussion 11/13/2019   Monoclonal gammopathy 10/26/2019   B12 deficiency 10/26/2019   Macrocytic anemia 10/26/2019    Past Surgical History:  Procedure Laterality Date   ABDOMINAL HYSTERECTOMY     HEMORROIDECTOMY     SEPTOPLASTY      Prior to Admission medications   Medication Sig Start Date End Date Taking?  Authorizing Provider  acyclovir (ZOVIRAX) 400 MG tablet TAKE 1 TABLET BY MOUTH TWICE A DAY 08/20/20   Corcoran, Drue Second, MD  albuterol (VENTOLIN HFA) 108 (90 Base) MCG/ACT inhaler 2 inhalations every 4 (four) hours as needed.    [provider]  alendronate (FOSAMAX) 70 MG tablet 70 mg once a week. 11/21/15   [provider]  aspirin 81 MG chewable tablet Chew by mouth daily.    [provider]  atorvastatin (LIPITOR) 20 MG tablet Take 20 mg by mouth daily.    [provider]  busPIRone (BUSPAR) 10 MG tablet Take 10 mg by mouth 2 (two) times daily.    [provider]  Calcium Carb-Cholecalciferol (CALCIUM 600 + D PO) Take 600 mg by mouth 5 (five) times daily. Increase calcium from $RemoveBefo'600mg'jCeAzwiAqRI$  4 times a day to $Remo'600mg'WgXtY$  5 times a day starting today 06/13/20   [provider]  dexamethasone (DECADRON) 4 MG tablet 5 TAB BY MOUTH ONCE A WEEK. 5 TAB WEEKLY THE DAY AFTER DARATUMUMAB X 8 WEEKS. TAKE WITH BREAKFAST. 08/20/20   Lequita Asal, MD  docusate sodium (COLACE) 100 MG capsule Take 100 mg by mouth daily.    [provider]  fluticasone-salmeterol (ADVAIR HFA) 230-21 MCG/ACT inhaler Inhale into the lungs. Patient not taking: No sig reported 01/28/17   [provider]  lenalidomide (REVLIMID) 15 MG capsule Take 1 capsule (15 mg total) by mouth daily.  Take 1 tablet daily x 3 weeks and 1 week off    Authorization Number: [id number]  Date:03/04/2021 03/04/21   Sindy Guadeloupe, MD  montelukast (SINGULAIR) 10 MG tablet Take 10 mg by mouth daily after breakfast.    [provider]  naproxen (NAPROSYN) 500 MG tablet Take 500 mg by mouth 2 (two) times daily with a meal.    [provider]  nortriptyline (PAMELOR) 25 MG capsule Take by mouth daily. At night 05/26/16   [provider]  omeprazole (PRILOSEC) 20 MG capsule Take 20 mg by mouth daily.    [provider]  ondansetron (ZOFRAN) 8 MG tablet Take 1 tablet (8 mg  total) by mouth every 8 (eight) hours as needed (Nausea or vomiting). Patient not taking: Reported on 03/12/2021 03/13/20   Lequita Asal, MD  sertraline (ZOLOFT) 100 MG tablet Take 100 mg by mouth 2 (two) times daily.    [provider]  traZODone (DESYREL) 100 MG tablet Take 50 mg by mouth at bedtime.    [provider]  Vitamin D, Ergocalciferol, (DRISDOL) 50000 UNITS CAPS capsule Take 50,000 Units by mouth every 7 (seven) days. Once a month    [provider]    Allergies Levofloxacin and Other  Family History  Problem Relation Age of Onset   Breast cancer Cousin        mat cousin    Social History Social History   Tobacco Use   Smoking status: Never   Smokeless tobacco: Never  Substance Use Topics   Alcohol use: No   Drug use: No    Review of Systems Constitutional: No fever/chills Eyes: No visual changes. ENT: Gradually worsening mouth and neck pain on the left side x1 week, significantly worse over 2 days. Cardiovascular: Denies chest pain. Respiratory: Denies shortness of breath. Gastrointestinal: No abdominal pain.  No nausea, no vomiting.  No diarrhea.  No constipation. Genitourinary: Negative for dysuria. Musculoskeletal: Negative for neck pain.  Negative for back pain. Integumentary: Negative for rash. Neurological: Negative for headaches, focal weakness or numbness.   ____________________________________________   PHYSICAL EXAM:  VITAL SIGNS: ED Triage Vitals  Enc Vitals Group     BP 03/30/21 1645 (!) 155/74     Pulse Rate 03/30/21 1645 (!) 110     Resp 03/30/21 1645 18     Temp 03/30/21 1645 98.7 F (37.1 C)     Temp Source 03/30/21 1645 Oral     SpO2 03/30/21 1645 96 %     Weight 03/30/21 1642 83.5 kg (184 lb)     Height 03/30/21 1642 1.651 m ($Remove'5\' 5"'CcBBvHO$ )     Head Circumference --      Peak Flow --      Pain Score 03/30/21 1641 3     Pain Loc --      Pain Edu? --      Excl. in [place]? --     Constitutional: Alert  and oriented.  Eyes: Conjunctivae are normal.  Head: Atraumatic. Nose: No congestion/rhinnorhea. Mouth/Throat: Patient has trismus and can only minimally open her mouth at this time although she is tolerating her secretions without difficulty.  No visible swelling or angioedema, but when she raises her tongue she has tenderness to palpation at the base of the tongue that is severe and results in some guarding. Neck: No stridor.  No meningeal signs.  Submandibular tenderness to palpation bilaterally but worse on the left.  No brawny induration beneath the mandible. Cardiovascular: Normal  rate, regular rhythm. Good peripheral circulation. Respiratory: Normal respiratory effort.  No retractions. Gastrointestinal: Soft and nontender. No distention.  Musculoskeletal: No lower extremity tenderness nor edema. No gross deformities of extremities. Neurologic:  Normal speech and language. No gross focal neurologic deficits are appreciated.  Skin:  Skin is warm, dry and intact. Psychiatric: Mood and affect are normal. Speech and behavior are normal.  ____________________________________________   LABS (all labs ordered are listed, but only abnormal results are displayed)  Labs Reviewed  BASIC METABOLIC PANEL - Abnormal; Notable for the following components:      Result Value   Potassium 3.4 (*)    Glucose, Bld 105 (*)    Calcium 7.8 (*)    All other components within normal limits  CBC WITH DIFFERENTIAL/PLATELET - Abnormal; Notable for the following components:   Lymphs Abs 0.5 (*)    All other components within normal limits  PROTIME-INR - Abnormal; Notable for the following components:   Prothrombin Time 15.9 (*)    INR 1.3 (*)    All other components within normal limits  RESP PANEL BY RT-PCR (FLU A&B, COVID) ARPGX2  CULTURE, BLOOD (ROUTINE X 2)  CULTURE, BLOOD (ROUTINE X 2)  APTT  LACTIC ACID, PLASMA  LACTIC ACID, PLASMA   ____________________________________________  RADIOLOGY I,  Hinda Kehr, personally viewed and evaluated these images (plain radiographs) as part of my medical decision making, as well as reviewing the written report by the radiologist.  ED MD interpretation: 4.5 cm fluid collection within the tongue concerning for abscess with surrounding edema.  Concerning for developing Ludwig's angina.  Official radiology report(s): CT Soft Tissue Neck W Contrast  Result Date: 03/30/2021 CLINICAL DATA:  Abscess?, left jaw pain EXAM: CT NECK WITH CONTRAST TECHNIQUE: Multidetector CT imaging of the neck was performed using the standard protocol following the bolus administration of intravenous contrast. CONTRAST:  59mL OMNIPAQUE IOHEXOL 300 MG/ML  SOLN COMPARISON:  None. FINDINGS: Pharynx and larynx: Within the tongue, there is a low-density collection with peripheral enhancement measuring 4.5 x 3.1 x 4.0 cm (AP x TR x CC) (series 2, image 42 and series 6, image 25), concerning for abscess. There is significant surrounding edema. The pharynx and larynx are otherwise normal. Salivary glands: No inflammation, mass, or stone. Thyroid: Normal. Lymph nodes: Prominent right 1B lymph node measuring up to 8 mm in short axis (series 2, image 42) no other enlarged or abnormal appearing lymph nodes. Vascular: Negative. Limited intracranial: Negative. Visualized orbits: Negative. Mastoids and visualized paranasal sinuses: Mucosal thickening in the right maxillary sinus. The mastoids are well aerated. Skeleton: No acute osseous abnormality. Some lytic lesions are seen in the inferior calvarium, consistent with the patient's diagnosis of multiple myeloma. Upper chest: No focal pulmonary opacity or pleural effusion. Other: None. IMPRESSION: Low-density collection within the tongue, with peripheral enhancement, measuring to 4.5 cm and concerning for abscess, with surrounding edema and possible reactive right level 1B lymph node. Electronically Signed   By: Merilyn Baba M.D.   On: 03/30/2021  23:44    ____________________________________________   PROCEDURES   Procedure(s) performed (including Critical Care):  .1-3 Lead EKG Interpretation Performed by: Hinda Kehr, MD Authorized by: Hinda Kehr, MD     Interpretation: abnormal     ECG rate:  101   ECG rate assessment: tachycardic     Rhythm: sinus tachycardia     Ectopy: none     Conduction: normal   .Critical Care Performed by: Hinda Kehr, MD Authorized by: Hinda Kehr,  MD   Critical care provider statement:    Critical care time (minutes):  30   Critical care time was exclusive of:  Separately billable procedures and treating other patients   Critical care was necessary to treat or prevent imminent or life-threatening deterioration of the following conditions: Potentially life-threatening tongue abscess that could lead to airway compromise.   Critical care was time spent personally by me on the following activities:  Development of treatment plan with patient or surrogate, evaluation of patient's response to treatment, examination of patient, obtaining history from patient or surrogate, ordering and performing treatments and interventions, ordering and review of laboratory studies, ordering and review of radiographic studies, pulse oximetry, re-evaluation of patient's condition and review of old charts   ____________________________________________   INITIAL IMPRESSION / MDM / ASSESSMENT AND PLAN / ED COURSE  As part of my medical decision making, I reviewed the following data within the electronic MEDICAL RECORD NUMBER Nursing notes reviewed and incorporated, Labs reviewed , Old chart reviewed, Radiograph reviewed , Discussed with admitting physician , Discussed with ENT (Dr. Lyn Hollingshead), and reviewed Notes from prior ED visits   Differential diagnosis includes, but is not limited to, Ludewig's angina, odontogenic infection, angioedema, allergic reaction.  The patient is on the cardiac monitor to evaluate  for evidence of arrhythmia and/or significant heart rate changes.  Vital signs are stable and notable only for mild tachycardia which could be due to volume depletion in the setting of decreased oral intake.  However it is very concerning that the patient is immunocompromised and has trismus and signs of intraoral infection.  I personally reviewed the CT soft tissue neck and I agree with the radiology report that there is a large fluid collection, likely abscess, at the base of the tongue.  Very concerning for developing Ludwig's angina and impending airway compromise.  However at this time she is stable and has been gradually worsening over several days.  I do not believe she would benefit from emergent intubation.  She is tolerating her secretions and has been waiting a long time in the emergency department after multiple days of gradually worsening symptoms.  I discussed the results with her.  Her lab work is generally reassuring with an essentially normal basic metabolic panel and CBC.  I ordered blood cultures and a lactic acid but I anticipate a normal lactic acid.  Will discuss case with ENT, but anticipate IV antibiotics, Decadron, and admission.      Clinical Course as of 03/31/21 0359  Mon Mar 31, 2021  6934 Discussed case by phone with Dr. Lyn Hollingshead with ENT.  We discussed the case and the management recommendations and he agreed with the plan for admission to the hospitalist service after receiving broad-spectrum IV antibiotics and Decadron 10 mg IV for ENT consult in the morning.  Given that she is immunocompromise on chemotherapy and is developing a tongue abscess concerning for developing Ludwig's angina, I will cover broadly and empirically with cefepime 2 g IV, vancomycin per pharmacy consult, and metronidazole 500 mg IV to try and cover normal mouth flora as well as MRSA and gram-negative rods. [CF]  [phone] Discussed case by phone with Dr. Para March with the hospitalist service who will  admit [CF]    Clinical Course User Index [CF] Loleta Rose, MD     ____________________________________________  FINAL CLINICAL IMPRESSION(S) / ED DIAGNOSES  Final diagnoses:  Abscess of tongue  Ludwig's angina     MEDICATIONS GIVEN DURING THIS VISIT:  Medications  lactated ringers bolus 1,000 mL (1,000 mLs Intravenous New Bag/Given 03/31/21 0336)  ceFEPIme (MAXIPIME) 2 g in sodium chloride 0.9 % 100 mL IVPB (2 g Intravenous New Bag/Given 03/31/21 0342)  metroNIDAZOLE (FLAGYL) IVPB 500 mg (500 mg Intravenous New Bag/Given 03/31/21 0345)  vancomycin (VANCOREADY) IVPB 2000 mg/400 mL (has no administration in time range)  iohexol (OMNIPAQUE) 300 MG/ML solution 75 mL (75 mLs Intravenous Contrast Given 03/30/21 2257)  dexamethasone (DECADRON) injection 10 mg (10 mg Intravenous Given 03/31/21 3212)     ED Discharge Orders     None        Note:  This document was prepared using Dragon voice recognition software and may include unintentional dictation errors.   Hinda Kehr, MD 03/31/21 [phone]

## 2021-03-31 NOTE — Progress Notes (Signed)
03/31/2021 5:25 PM  Laurie [name] [id number]  S/P needle aspirate, feeling better after procedure and on meds.      Temp:  [97.8 F (36.6 C)] 97.8 F (36.6 C) (11/28 1637) Pulse Rate:  [99-107] 101 (11/28 1515) Resp:  [16-23] 20 (11/28 1515) BP: (142-174)/(55-141) 156/83 (11/28 1515) SpO2:  [94 %-99 %] 96 % (11/28 1515),    No intake or output data in the 24 hours ending 03/31/21 1725  Results for orders placed or performed during the hospital encounter of 03/31/21 (from the past 24 hour(s))  Resp Panel by RT-PCR (Flu A&B, Covid) Nasopharyngeal Swab     Status: None   Collection Time: 03/31/21  3:19 AM   Specimen: Nasopharyngeal Swab; Nasopharyngeal(NP) swabs in vial transport medium  Result Value Ref Range   SARS Coronavirus 2 by RT PCR NEGATIVE NEGATIVE   Influenza A by PCR NEGATIVE NEGATIVE   Influenza B by PCR NEGATIVE NEGATIVE  Lactic acid, plasma     Status: None   Collection Time: 03/31/21  3:19 AM  Result Value Ref Range   Lactic Acid, Venous 1.0 0.5 - 1.9 mmol/L  Blood Culture (routine x 2)     Status: None (Preliminary result)   Collection Time: 03/31/21  3:20 AM   Specimen: BLOOD  Result Value Ref Range   Specimen Description BLOOD LEFT AC    Special Requests      BOTTLES DRAWN AEROBIC AND ANAEROBIC Blood Culture results may not be optimal due to an inadequate volume of blood received in culture bottles   Culture      NO GROWTH < 12 HOURS Performed at [facility], [address], [place], [place] [address]    Report Status PENDING   Blood Culture (routine x 2)     Status: None (Preliminary result)   Collection Time: 03/31/21  3:20 AM   Specimen: BLOOD  Result Value Ref Range   Specimen Description BLOOD RIGHT AC    Special Requests      BOTTLES DRAWN AEROBIC AND ANAEROBIC Blood Culture adequate volume   Culture      NO GROWTH < 12 HOURS Performed at [facility], [address] [place], [place] [address]    Report Status PENDING    APTT     Status: None   Collection Time: 03/31/21  3:29 AM  Result Value Ref Range   aPTT 28 24 - 36 seconds  Protime-INR     Status: Abnormal   Collection Time: 03/31/21  3:29 AM  Result Value Ref Range   Prothrombin Time 15.9 (H) 11.4 - 15.2 seconds   INR 1.3 (H) 0.8 - 1.2  Lactic acid, plasma     Status: None   Collection Time: 03/31/21  7:02 AM  Result Value Ref Range   Lactic Acid, Venous 1.0 0.5 - 1.9 mmol/L  Creatinine, serum     Status: None   Collection Time: 03/31/21 12:03 PM  Result Value Ref Range   Creatinine, Ser 0.81 0.44 - 1.00 mg/dL   GFR, Estimated >60 >60 mL/min    SUBJECTIVE: Feeling better  OBJECTIVE:  Floor of mouth swelling slightly improved, and neck less tender  IMPRESSION:  Neck/floor of mouth abscess-2 cc pus reportedly aspirated, cultures sent and pending.  Recommend continue IV meds over night.  I will recheck in am, if not improving will consider repeat CT.  Continue npo overnight  PLAN:  continue meds overnight,  I will recheck in am.  Recommend increasing decadron to  10mg  iv q 8hrs.    [name] 03/31/2021, 5:25 PM

## 2021-03-31 NOTE — Progress Notes (Signed)
PHARMACY -  BRIEF ANTIBIOTIC NOTE   Pharmacy has received consult(s) for Vancomycin, Cefepime from an ED provider.  The patient's profile has been reviewed for ht/wt/allergies/indication/available labs.    One time order(s) placed for Cefepime 2 gm IV X 1 and Vancomycin 2 gm IV X 1   Further antibiotics/pharmacy consults should be ordered by admitting physician if indicated.                       Thank you, Brennen Gardiner D 03/31/2021  3:06 AM

## 2021-04-01 ENCOUNTER — Telehealth: Payer: Self-pay | Admitting: *Deleted

## 2021-04-01 ENCOUNTER — Encounter: Payer: Self-pay | Admitting: Internal Medicine

## 2021-04-01 ENCOUNTER — Other Ambulatory Visit: Payer: Self-pay | Admitting: *Deleted

## 2021-04-01 DIAGNOSIS — C9000 Multiple myeloma not having achieved remission: Secondary | ICD-10-CM

## 2021-04-01 DIAGNOSIS — F32A Depression, unspecified: Secondary | ICD-10-CM

## 2021-04-01 LAB — BASIC METABOLIC PANEL
Anion gap: 9 (ref 5–15)
BUN: 15 mg/dL (ref 8–23)
CO2: 21 mmol/L — ABNORMAL LOW (ref 22–32)
Calcium: 7.1 mg/dL — ABNORMAL LOW (ref 8.9–10.3)
Chloride: 111 mmol/L (ref 98–111)
Creatinine, Ser: 0.73 mg/dL (ref 0.44–1.00)
GFR, Estimated: >60 mL/min (ref >60)
Glucose, Bld: 118 mg/dL — ABNORMAL HIGH (ref 70–99)
Potassium: 3.9 mmol/L (ref 3.5–5.1)
Sodium: 141 mmol/L (ref 135–145)

## 2021-04-01 LAB — CBC
HCT: 39 % (ref 36.0–46.0)
Hemoglobin: 12.5 g/dL (ref 12.0–15.0)
MCH: 30 pg (ref 26.0–34.0)
MCHC: 32.1 g/dL (ref 30.0–36.0)
MCV: 93.8 fL (ref 80.0–100.0)
Platelets: 258 10*3/uL (ref 150–400)
RBC: 4.16 MIL/uL (ref 3.87–5.11)
RDW: 14.1 % (ref 11.5–15.5)
WBC: 6.3 10*3/uL (ref 4.0–10.5)
nRBC: 0 % (ref 0.0–0.2)

## 2021-04-01 MED ORDER — SERTRALINE HCL 50 MG PO TABS
100.0000 mg | ORAL_TABLET | Freq: Every day | ORAL | Status: DC
  Administered 2021-04-01 – 2021-04-03 (×3): 100 mg via ORAL
  Filled 2021-04-01 (×2): qty 2

## 2021-04-01 MED ORDER — ENOXAPARIN SODIUM 40 MG/0.4ML IJ SOSY
40.0000 mg | PREFILLED_SYRINGE | INTRAMUSCULAR | Status: DC
  Administered 2021-04-01 – 2021-04-02 (×2): 40 mg via SUBCUTANEOUS
  Filled 2021-04-01: qty 0.4

## 2021-04-01 MED ORDER — INFLUENZA VAC A&B SA ADJ QUAD 0.5 ML IM PRSY
0.5000 mL | PREFILLED_SYRINGE | INTRAMUSCULAR | Status: AC
  Administered 2021-04-03: 0.5 mL via INTRAMUSCULAR
  Filled 2021-04-01: qty 0.5

## 2021-04-01 MED ORDER — BUSPIRONE HCL 10 MG PO TABS
10.0000 mg | ORAL_TABLET | Freq: Two times a day (BID) | ORAL | Status: DC
  Administered 2021-04-01 – 2021-04-03 (×5): 10 mg via ORAL
  Filled 2021-04-01 (×6): qty 1

## 2021-04-01 MED ORDER — ENOXAPARIN SODIUM 40 MG/0.4ML IJ SOSY
PREFILLED_SYRINGE | INTRAMUSCULAR | Status: AC
  Filled 2021-04-01: qty 0.4

## 2021-04-01 NOTE — Telephone Encounter (Signed)
Called and left message about the pt is in hospital at this time . MD note: She has ongoing periodontic issues/abscess being treated and she is currently admitted. Hold revlimid for now. Will contact pharmacy when we have news

## 2021-04-01 NOTE — Plan of Care (Signed)

## 2021-04-01 NOTE — Progress Notes (Signed)
04/01/2021 8:07 AM  Laurie [name] [id number]  Patient awake and alert says pain has significantly improved and is now able to move her tongue more freely.  The neck pain and swelling has also improved.    Temp:  [97.5 F (36.4 C)-98.6 F (37 C)] 98.6 F (37 C) (11/29 0755) Pulse Rate:  [100-108] 108 (11/29 0755) Resp:  [16-22] 18 (11/29 0755) BP: (142-174)/(66-91) 168/91 (11/29 0755) SpO2:  [94 %-100 %] 100 % (11/29 0755),    No intake or output data in the 24 hours ending 04/01/21 0807  Results for orders placed or performed during the hospital encounter of 03/31/21 (from the past 24 hour(s))  Creatinine, serum     Status: None   Collection Time: 03/31/21 12:03 PM  Result Value Ref Range   Creatinine, Ser 0.81 0.44 - 1.00 mg/dL   GFR, Estimated >60 >60 mL/min  Aerobic/Anaerobic Culture w Gram Stain (surgical/deep wound)     Status: None (Preliminary result)   Collection Time: 03/31/21  3:26 PM   Specimen: Abscess  Result Value Ref Range   Specimen Description      ABSCESS Performed at [facility], [address] [place], [place] [address]    Special Requests      NONE Performed at [facility], [address], [place], [place] [address]    Gram Stain      NO WBC SEEN ABUNDANT GRAM POSITIVE COCCI IN PAIRS AND CHAINS Performed at [facility], [place] [address], [place], [place] [address]    Culture PENDING    Report Status PENDING   CBC     Status: None   Collection Time: 04/01/21  6:01 AM  Result Value Ref Range   WBC 6.3 4.0 - 10.5 K/uL   RBC 4.16 3.87 - 5.11 MIL/uL   Hemoglobin 12.5 12.0 - 15.0 g/dL   HCT 39.0 36.0 - 46.0 %   MCV 93.8 80.0 - 100.0 fL   MCH 30.0 26.0 - 34.0 pg   MCHC 32.1 30.0 - 36.0 g/dL   RDW 14.1 11.5 - 15.5 %   Platelets 258 150 - 400 K/uL   nRBC 0.0 0.0 - 0.2 %  Basic metabolic panel     Status: Abnormal   Collection Time: 04/01/21  6:01 AM  Result Value Ref Range   Sodium 141 135 - 145 mmol/L   Potassium  3.9 3.5 - 5.1 mmol/L   Chloride 111 98 - 111 mmol/L   CO2 21 (L) 22 - 32 mmol/L   Glucose, Bld 118 (H) 70 - 99 mg/dL   BUN 15 8 - 23 mg/dL   Creatinine, Ser 0.73 0.44 - 1.00 mg/dL   Calcium 7.1 (L) 8.9 - 10.3 mg/dL   GFR, Estimated >60 >60 mL/min   Anion gap 9 5 - 15    SUBJECTIVE: Patient feeling much better is hungry today and would like to try to eat.  OBJECTIVE: The floor mouth swelling continues to improve there is no evidence of airway obstruction.  She continues to have very poor dentition with purulence draining around several of the broken teeth.  Palpation the neck shows significant decrease in the anterior swelling there is no erythema the tenderness has significantly improved.  IMPRESSION: Cultures's are pending from the neck aspirate however the gram stain did show gram-positive cocci in pairs and chains.  PLAN: Overall significant improvement from my initial exam yesterday.  There continues to be no evidence of airway obstruction.  The floor mouth  continues to improve the neck swelling has continued to improve as well.  The patient is hungry would recommend we try her with a clear liquid diet today see how this is tolerated.  We will also recommend that care management organize an appointment with dentistry as quickly as possible after discharge to prevent this from happening again.  I will continue to follow, but I do not think she will require surgery at this point unless her symptoms worsen.  She understands and agrees with this plan.  Would continue current antibiotic regimen as well as Decadron until final culture results return.  [name] 04/01/2021, 8:07 AM

## 2021-04-01 NOTE — Progress Notes (Signed)
PROGRESS NOTE   HPI was taken from Dr. Damita Dunnings: Laurie [name] is a 75 y.o. female with medical history significant for Asthma, multiple myeloma on maintenance chemotherapy who presents to the ED with a 1 week history of pain on the left side of her mouth and neck that has been gradually worsening though it is no difficulty opening her mouth or swallowing anything.  She denies prior dental cavities.  Denies fever or chills, sore throat or shortness of breath or difficulty breathing.  Denies nausea or vomiting.  The pain is severe and radiates to under her left jaw   ED course: Tachycardic at 110 with BP 155/74 and otherwise normal vitals Blood work: WBC 6.8 with lactic acid 1.0.  Labs mostly unremarkable   Imaging: CT soft tissue neck with contrast showing 4.5 cm fluid collection concerning for abscess within the tongue with significant surrounding edema   The ED provider spoke with ENT specialist, Dr. Sheppard Coil who recommended broad-spectrum antibiotics, Decadron and admission to the hospitalist service.  Patient was started on cefepime vancomycin and Flagyl.    Laurie [name]  JFH:[id number] DOB: 07-11-1945 DOA: 03/31/2021 PCP: Denton Lank, MD   Assessment & Plan:   Principal Problem:   Abscess of tongue Active Problems:   Multiple myeloma not having achieved remission ([place])   Maintenance chemotherapy   Asthma   Depression   Abscess of tongue: continue on IV unasyn, vanco. Continue on IV decadron as per ENT. Continue on IVFs and clear liquid diet, can likely advance as tolerated. S/p ID of tongue abscess. Tongue abscess cx growing gram positive cocci in pairs & chains, cx not finalized. ENT following and recs apprec.   Multiple myeloma: continue to hold home dose of lenalidomide for now. Immunosuppressed. Management per onco outpatient    Asthma: unknown stage and/or severity. Continue on bronchodilators. Encourage incentive spirometry    Depression: severity unknown. Continue  to hold home dose of sertraline    DVT prophylaxis: lovenox  Code Status: full  Family Communication:  Disposition Plan: depends on PT/OT recs (not consulted yet)   Level of care: Telemetry Medical  Status is: Inpatient  Remains inpatient appropriate because: severity of illness, as stated above      Consultants:  ENT   Procedures:   Antimicrobials: unasyn, vanco  Subjective: Pt c/o tongue pain and swelling, improved from day prior   Objective: Vitals:   03/31/21 1637 03/31/21 1820 03/31/21 1958 04/01/21 0755  BP:  (!) 142/77 (!) 163/66 (!) 168/91  Pulse:  100 (!) 104 (!) 108  Resp:  (!) _0 Temp: 97.8 F (36.6 C) 98.1 F (36.7 C) (!) 97.5 F (36.4 C) 98.6 F (37 C)  TempSrc: Oral Oral Oral Oral  SpO2:  100% 98% 100%  Weight:      Height:       No intake or output data in the 24 hours ending 04/01/21 0839 Filed Weights   03/30/21 1642  Weight: 83.5 kg    Examination:  General exam: Appears comfortable   Respiratory system: clear breath sounds b/l  Cardiovascular system: S1/S2+. No clicks or rubs  Gastrointestinal system: Abd is soft, NT, ND & hypoactive bowel sounds  Central nervous system: Alert and oriented. Moves all extremities Psychiatry: judgement and insight appear normal. Flat mood and affect     Data Reviewed: I have personally reviewed following labs and imaging studies  CBC: Recent Labs  Lab 03/30/21 1646 04/01/21 0601  WBC 6.8 6.3  NEUTROABS  5.1  --   HGB 13.4 12.5  HCT 39.7 39.0  MCV 92.1 93.8  PLT 207 119   Basic Metabolic Panel: Recent Labs  Lab 03/30/21 1646 03/31/21 1203 04/01/21 0601  NA 136  --  141  K 3.4*  --  3.9  CL 104  --  111  CO2 22  --  21*  GLUCOSE 105*  --  118*  BUN 16  --  15  CREATININE 0.78 0.81 0.73  CALCIUM 7.8*  --  7.1*   GFR: Estimated Creatinine Clearance: 66.8 mL/min (by C-G formula based on SCr of 0.73 mg/dL). Liver Function Tests: No results for input(s): AST, ALT, ALKPHOS,  BILITOT, PROT, ALBUMIN in the last 168 hours. No results for input(s): LIPASE, AMYLASE in the last 168 hours. No results for input(s): AMMONIA in the last 168 hours. Coagulation Profile: Recent Labs  Lab 03/31/21 0329  INR 1.3*   Cardiac Enzymes: No results for input(s): CKTOTAL, CKMB, CKMBINDEX, TROPONINI in the last 168 hours. BNP (last 3 results) No results for input(s): PROBNP in the last 8760 hours. HbA1C: No results for input(s): HGBA1C in the last 72 hours. CBG: No results for input(s): GLUCAP in the last 168 hours. Lipid Profile: No results for input(s): CHOL, HDL, LDLCALC, TRIG, CHOLHDL, LDLDIRECT in the last 72 hours. Thyroid Function Tests: No results for input(s): TSH, T4TOTAL, FREET4, T3FREE, THYROIDAB in the last 72 hours. Anemia Panel: No results for input(s): VITAMINB12, FOLATE, FERRITIN, TIBC, IRON, RETICCTPCT in the last 72 hours. Sepsis Labs: Recent Labs  Lab 03/31/21 0319 03/31/21 0702  LATICACIDVEN 1.0 1.0    Recent Results (from the past 240 hour(s))  Resp Panel by RT-PCR (Flu A&B, Covid) Nasopharyngeal Swab     Status: None   Collection Time: 03/31/21  3:19 AM   Specimen: Nasopharyngeal Swab; Nasopharyngeal(NP) swabs in vial transport medium  Result Value Ref Range Status   SARS Coronavirus 2 by RT PCR NEGATIVE NEGATIVE Final    Comment: (NOTE) SARS-CoV-2 target nucleic acids are NOT DETECTED.  The SARS-CoV-2 RNA is generally detectable in upper respiratory specimens during the acute phase of infection. The lowest concentration of SARS-CoV-2 viral copies this assay can detect is 138 copies/mL. A negative result does not preclude SARS-Cov-2 infection and should not be used as the sole basis for treatment or other patient management decisions. A negative result may occur with  improper specimen collection/handling, submission of specimen other than nasopharyngeal swab, presence of viral mutation(s) within the areas targeted by this assay, and  inadequate number of viral copies(<138 copies/mL). A negative result must be combined with clinical observations, patient history, and epidemiological information. The expected result is Negative.  Fact Sheet for Patients:  EntrepreneurPulse.com.au  Fact Sheet for Healthcare Providers:  IncredibleEmployment.be  This test is no t yet approved or cleared by the [place] FDA and  has been authorized for detection and/or diagnosis of SARS-CoV-2 by FDA under an Emergency Use Authorization (EUA). This EUA will remain  in effect (meaning this test can be used) for the duration of the COVID-19 declaration under Section 564(b)(1) of the Act, 21 U.S.C.section 360bbb-3(b)(1), unless the authorization is terminated  or revoked sooner.       Influenza A by PCR NEGATIVE NEGATIVE Final   Influenza B by PCR NEGATIVE NEGATIVE Final    Comment: (NOTE) The Xpert Xpress SARS-CoV-2/FLU/RSV plus assay is intended as an aid in the diagnosis of influenza from Nasopharyngeal swab specimens and should not be used as a  sole basis for treatment. Nasal washings and aspirates are unacceptable for Xpert Xpress SARS-CoV-2/FLU/RSV testing.  Fact Sheet for Patients: EntrepreneurPulse.com.au  Fact Sheet for Healthcare Providers: IncredibleEmployment.be  This test is not yet approved or cleared by the [place] FDA and has been authorized for detection and/or diagnosis of SARS-CoV-2 by FDA under an Emergency Use Authorization (EUA). This EUA will remain in effect (meaning this test can be used) for the duration of the COVID-19 declaration under Section 564(b)(1) of the Act, 21 U.S.C. section 360bbb-3(b)(1), unless the authorization is terminated or revoked.  Performed at [facility], [address] [place], [place] [address]   Blood Culture (routine x 2)     Status: None (Preliminary result)   Collection Time:  03/31/21  3:20 AM   Specimen: BLOOD  Result Value Ref Range Status   Specimen Description BLOOD LEFT AC  Final   Special Requests   Final    BOTTLES DRAWN AEROBIC AND ANAEROBIC Blood Culture results may not be optimal due to an inadequate volume of blood received in culture bottles   Culture   Final    NO GROWTH < 12 HOURS Performed at [facility], [address], [place], [place] [address]    Report Status PENDING  Incomplete  Blood Culture (routine x 2)     Status: None (Preliminary result)   Collection Time: 03/31/21  3:20 AM   Specimen: BLOOD  Result Value Ref Range Status   Specimen Description BLOOD RIGHT AC  Final   Special Requests   Final    BOTTLES DRAWN AEROBIC AND ANAEROBIC Blood Culture adequate volume   Culture   Final    NO GROWTH < 12 HOURS Performed at [facility], [address], [place], [place] [address]    Report Status PENDING  Incomplete  Aerobic/Anaerobic Culture w Gram Stain (surgical/deep wound)     Status: None (Preliminary result)   Collection Time: 03/31/21  3:26 PM   Specimen: Abscess  Result Value Ref Range Status   Specimen Description   Final    ABSCESS Performed at [facility], [address], [place], [place] [address]    Special Requests   Final    NONE Performed at [facility], [address] [place], [place] [address]    Gram Stain   Final    NO WBC SEEN ABUNDANT GRAM POSITIVE COCCI IN PAIRS AND CHAINS Performed at [facility], [place] [address], [place], [place] [address]    Culture PENDING  Incomplete   Report Status PENDING  Incomplete         Radiology Studies: CT Soft Tissue Neck W Contrast  Result Date: 03/30/2021 CLINICAL DATA:  Abscess?, left jaw pain EXAM: CT NECK WITH CONTRAST TECHNIQUE: Multidetector CT imaging of the neck was performed using the standard protocol following the bolus administration of intravenous contrast. CONTRAST:  46m OMNIPAQUE IOHEXOL 300 MG/ML   SOLN COMPARISON:  None. FINDINGS: Pharynx and larynx: Within the tongue, there is a low-density collection with peripheral enhancement measuring 4.5 x 3.1 x 4.0 cm (AP x TR x CC) (series 2, image 42 and series 6, image 25), concerning for abscess. There is significant surrounding edema. The pharynx and larynx are otherwise normal. Salivary glands: No inflammation, mass, or stone. Thyroid: Normal. Lymph nodes: Prominent right 1B lymph node measuring up to 8 mm in short axis (series 2, image 42) no other enlarged or abnormal appearing lymph nodes. Vascular: Negative. Limited intracranial: Negative.  Visualized orbits: Negative. Mastoids and visualized paranasal sinuses: Mucosal thickening in the right maxillary sinus. The mastoids are well aerated. Skeleton: No acute osseous abnormality. Some lytic lesions are seen in the inferior calvarium, consistent with the patient's diagnosis of multiple myeloma. Upper chest: No focal pulmonary opacity or pleural effusion. Other: None. IMPRESSION: Low-density collection within the tongue, with peripheral enhancement, measuring to 4.5 cm and concerning for abscess, with surrounding edema and possible reactive right level 1B lymph node. Electronically Signed   By: Merilyn Baba M.D.   On: 03/30/2021 23:44   [place] Abscess Drain  Result Date: 03/31/2021 INDICATION: Sublingual abscess EXAM: Ultrasound-guided aspiration of sublingual abscess MEDICATIONS: None ANESTHESIA/SEDATION: Local analgesia COMPLICATIONS: None immediate. PROCEDURE: Informed written consent was obtained from the patient after a thorough discussion of the procedural risks, benefits and alternatives. All questions were addressed. Maximal Sterile Barrier Technique was utilized including caps, mask, sterile gowns, sterile gloves, sterile drape, hand hygiene and skin antiseptic. A timeout was performed prior to the initiation of the procedure. The patient was placed supine on the exam table. Limited ultrasound of the  sublingual region was performed which demonstrated a complex and septated appearance of a sublingual fluid collection. Skin entry site was marked, and the overlying skin was prepped and draped in a standard sterile fashion. Local analgesia was obtained with 1% lidocaine. Using ultrasound guidance, a 19 gauge Yueh catheter was advanced into the complex fluid collection. Approximately 2 mL of thick purulent fluid was aspirated. Due to the complex and septated nature of the collection, no further fluid was able to be aspirated percutaneously. Samples were sent to the lab for further analysis, including both microbiology and cytology. A clean dressing was placed. The patient tolerated the procedure well without immediate complication. IMPRESSION: Ultrasound-guided aspiration of sublingual abscess yielded approximately 2 mL of purulent material. Due to the thick and complex/septated nature of the collection, the abscess was unable to be completely decompressed percutaneously. Further recommendations per ENT. Electronically Signed   By: Albin Felling M.D.   On: 03/31/2021 15:45        Scheduled Meds:  dexamethasone (DECADRON) injection  10 mg Intravenous Q8H   [START ON 04/02/2021] influenza vaccine adjuvanted  0.5 mL Intramuscular Tomorrow-1000   Continuous Infusions:  sodium chloride 75 mL/hr at 03/31/21 2034   ampicillin-sulbactam (UNASYN) IV 3 g (04/01/21 0152)   vancomycin 1,000 mg (04/01/21 0646)     LOS: 1 day    Time spent: 25 mins     [name], MD [facility] Pager [phone]  If 7PM-7AM, please contact night-coverage 04/01/2021, 8:39 AM

## 2021-04-01 NOTE — Progress Notes (Signed)
04/01/2021 4:41 PM  Laurie [name] [id number]  Feeling better this afternoon, po intake improving    Temp:  [97.5 F (36.4 C)-98.6 F (37 C)] 98.1 F (36.7 C) (11/29 1559) Pulse Rate:  [49-108] 49 (11/29 1559) Resp:  [16-22] 18 (11/29 1559) BP: (142-175)/(66-99) 175/99 (11/29 1559) SpO2:  [98 %-100 %] 98 % (11/29 1559),    No intake or output data in the 24 hours ending 04/01/21 1641  Results for orders placed or performed during the hospital encounter of 03/31/21 (from the past 24 hour(s))  CBC     Status: None   Collection Time: 04/01/21  6:01 AM  Result Value Ref Range   WBC 6.3 4.0 - 10.5 K/uL   RBC 4.16 3.87 - 5.11 MIL/uL   Hemoglobin 12.5 12.0 - 15.0 g/dL   HCT 39.0 36.0 - 46.0 %   MCV 93.8 80.0 - 100.0 fL   MCH 30.0 26.0 - 34.0 pg   MCHC 32.1 30.0 - 36.0 g/dL   RDW 14.1 11.5 - 15.5 %   Platelets 258 150 - 400 K/uL   nRBC 0.0 0.0 - 0.2 %  Basic metabolic panel     Status: Abnormal   Collection Time: 04/01/21  6:01 AM  Result Value Ref Range   Sodium 141 135 - 145 mmol/L   Potassium 3.9 3.5 - 5.1 mmol/L   Chloride 111 98 - 111 mmol/L   CO2 21 (L) 22 - 32 mmol/L   Glucose, Bld 118 (H) 70 - 99 mg/dL   BUN 15 8 - 23 mg/dL   Creatinine, Ser 0.73 0.44 - 1.00 mg/dL   Calcium 7.1 (L) 8.9 - 10.3 mg/dL   GFR, Estimated >60 >60 mL/min   Anion gap 9 5 - 15    SUBJECTIVE:  Feeling better, clear liquids not having issues  OBJECTIVE:  floor of mouth swelling continues to resolve, neck swelling almost resolved  IMPRESSION:  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  Needs to see dentist immediately upon discharge.  [name] 04/01/2021, 4:41 PM

## 2021-04-02 LAB — CBC
HCT: 38.2 % (ref 36.0–46.0)
Hemoglobin: 12.6 g/dL (ref 12.0–15.0)
MCH: 30.4 pg (ref 26.0–34.0)
MCHC: 33 g/dL (ref 30.0–36.0)
MCV: 92.3 fL (ref 80.0–100.0)
Platelets: 302 10*3/uL (ref 150–400)
RBC: 4.14 MIL/uL (ref 3.87–5.11)
RDW: 14.3 % (ref 11.5–15.5)
WBC: 6.6 10*3/uL (ref 4.0–10.5)
nRBC: 0 % (ref 0.0–0.2)

## 2021-04-02 LAB — BASIC METABOLIC PANEL
Anion gap: 7 (ref 5–15)
BUN: 14 mg/dL (ref 8–23)
CO2: 22 mmol/L (ref 22–32)
Calcium: 6.7 mg/dL — ABNORMAL LOW (ref 8.9–10.3)
Chloride: 109 mmol/L (ref 98–111)
Creatinine, Ser: 0.66 mg/dL (ref 0.44–1.00)
GFR, Estimated: >60 mL/min (ref >60)
Glucose, Bld: 140 mg/dL — ABNORMAL HIGH (ref 70–99)
Potassium: 3.4 mmol/L — ABNORMAL LOW (ref 3.5–5.1)
Sodium: 138 mmol/L (ref 135–145)

## 2021-04-02 MED ORDER — POTASSIUM CHLORIDE CRYS ER 20 MEQ PO TBCR
40.0000 meq | EXTENDED_RELEASE_TABLET | Freq: Once | ORAL | Status: AC
  Administered 2021-04-02: 40 meq via ORAL
  Filled 2021-04-02: qty 2

## 2021-04-02 NOTE — Progress Notes (Signed)
PROGRESS NOTE  Laurie [name]  DOB: [birth year]  PCP: Denton Lank, MD XBW:[id number]  DOA: 03/31/2021  LOS: 2 days  Hospital Day: 3  Chief Complaint  Patient presents with   Jaw Pain   Brief narrative: Laurie [name] is a 75 y.o. female with PMH significant for multiple myeloma on maintenance chemotherapy, asthma, anxiety/depression. Patient presented to the ED on 11/28 with 1 week history of progressively worsening pain on the left side of her mouth and neck that affected her swallowing as well.   CT soft tissue neck with contrast showed 4.5 cm fluid collection concerning for abscess within the tongue with significant surrounding edema Admitted to hospitalist service Started on broad-spectrum antibiotics.  ENT and IR consult were obtained. 11/28, patient underwent 2 mL of fluid aspiration by ultrasound guidance.  Subjective: Patient was seen and examined this morning.  Assessment/Plan: Abscess of tongue -Status post needle aspiration by IR on 11/28.   -ENT consult appreciated.  Currently on IV Decadron, IV Unasyn and IV vancomycin.   -Abscess culture grew gram-positive cocci in chains.  Pending sensitivity. -Pain control.  Wanted to be advanced to a regular diet today.  Multiple myeloma -continue to hold home dose of lenalidomide for now. Immunosuppressed. Management per onco outpatient  Recent Labs  Lab 03/30/21 1646 04/01/21 0601 04/02/21 0619  WBC 6.8 6.3 6.6  NEUTROABS 5.1  --   --   HGB 13.4 12.5 12.6  HCT 39.7 39.0 38.2  MCV 92.1 93.8 92.3  PLT 207 258 302   Hypokalemia -Potassium replaced this morning. Recent Labs  Lab 03/30/21 1646 04/01/21 0601 04/02/21 0619  K 3.4* 3.9 3.4*   Asthma -Continue bronchodilators  Poor dental hygiene -Needs dental follow-up as quick as possible as an outpatient   Depression -Continue to hold home dose of sertraline    Mobility: Encourage ambulation Living condition: Lives at home alone Goals of care:   Code  Status: Full Code  Nutritional status: Body mass index is 30.62 kg/m.     Diet:  Diet Order             Diet regular Room service appropriate? Yes; Fluid consistency: Thin  Diet effective now                  DVT prophylaxis:  enoxaparin (LOVENOX) injection 40 mg Start: 04/01/21 1800 enoxaparin (LOVENOX) 40 MG/0.4ML injection Start: 04/01/21 1708 Place and maintain sequential compression device Start: 03/31/21 1444 SCDs Start: 03/31/21 0424   Antimicrobials: IV Unasyn vancomycin Fluid: Normal saline at 75 mill per hour Consultants: ENT, IR Family Communication: None at bedside  Status is: Inpatient  Remains inpatient appropriate because: Pending culture sensitivity  Dispo: The patient is from: Home              Anticipated d/c is to: Home in 1 to 2 days              Patient currently is not medically stable to d/c.   Difficult to place patient No     Infusions:   sodium chloride 75 mL/hr at 04/02/21 1356   ampicillin-sulbactam (UNASYN) IV 3 g (04/02/21 1406)   vancomycin Stopped (04/02/21 0659)    Scheduled Meds:  busPIRone  10 mg Oral BID   dexamethasone (DECADRON) injection  10 mg Intravenous Q8H   enoxaparin (LOVENOX) injection  40 mg Subcutaneous Q24H   influenza vaccine adjuvanted  0.5 mL Intramuscular Tomorrow-1000   sertraline  100 mg Oral Daily  PRN meds: acetaminophen **OR** acetaminophen, morphine injection, ondansetron **OR** ondansetron (ZOFRAN) IV   Antimicrobials: Anti-infectives (From admission, onward)    Start     Dose/Rate Route Frequency Ordered Stop   04/01/21 0600  vancomycin (VANCOCIN) IVPB 1000 mg/200 mL premix  Status:  Discontinued        1,000 mg 200 mL/hr over 60 Minutes Intravenous Every 24 hours 03/31/21 0452 03/31/21 1957   04/01/21 0600  vancomycin (VANCOREADY) IVPB 1000 mg/200 mL        1,000 mg 200 mL/hr over 60 Minutes Intravenous Every 24 hours 03/31/21 1957     03/31/21 1600  metroNIDAZOLE (FLAGYL) IVPB 500 mg   Status:  Discontinued        500 mg 100 mL/hr over 60 Minutes Intravenous Every 12 hours 03/31/21 0422 03/31/21 1250   03/31/21 1400  Ampicillin-Sulbactam (UNASYN) 3 g in sodium chloride 0.9 % 100 mL IVPB        3 g 200 mL/hr over 30 Minutes Intravenous Every 6 hours 03/31/21 1309     03/31/21 1300  ampicillin-sulbactam (UNASYN) 1.5 g in sodium chloride 0.9 % 100 mL IVPB  Status:  Discontinued        1.5 g 200 mL/hr over 30 Minutes Intravenous Every 6 hours 03/31/21 1250 03/31/21 1309   03/31/21 1200  ceFEPIme (MAXIPIME) 2 g in sodium chloride 0.9 % 100 mL IVPB  Status:  Discontinued        2 g 200 mL/hr over 30 Minutes Intravenous Every 8 hours 03/31/21 0452 03/31/21 1250   03/31/21 0315  vancomycin (VANCOREADY) IVPB 2000 mg/400 mL        2,000 mg 200 mL/hr over 120 Minutes Intravenous  Once 03/31/21 0303 03/31/21 0751   03/31/21 0300  ceFEPIme (MAXIPIME) 2 g in sodium chloride 0.9 % 100 mL IVPB        2 g 200 mL/hr over 30 Minutes Intravenous  Once 03/31/21 0254 03/31/21 0444   03/31/21 0300  metroNIDAZOLE (FLAGYL) IVPB 500 mg        500 mg 100 mL/hr over 60 Minutes Intravenous  Once 03/31/21 0254 03/31/21 0512   03/31/21 0300  vancomycin (VANCOCIN) IVPB 1000 mg/200 mL premix  Status:  Discontinued        1,000 mg 200 mL/hr over 60 Minutes Intravenous  Once 03/31/21 0254 03/31/21 0302       Objective: Vitals:   04/02/21 0246 04/02/21 0758  BP: (!) 156/86 (!) 156/79  Pulse: 84 91  Resp: 16 18  Temp: (!) 97.5 F (36.4 C) (!) 97.5 F (36.4 C)  SpO2: 99% 97%    Intake/Output Summary (Last 24 hours) at 04/02/2021 1446 Last data filed at 04/02/2021 1356 Gross per 24 hour  Intake 3569.33 ml  Output --  Net 3569.33 ml   Filed Weights   03/30/21 1642  Weight: 83.5 kg   Weight change:  Body mass index is 30.62 kg/m.   Physical Exam: General exam: Pleasant, elderly Caucasian female.  Not in distress Skin: No rashes, lesions or ulcers. HEENT: Atraumatic, normocephalic,  no obvious bleeding.  Poor dental hygiene Lungs: Clear to auscultation bilaterally CVS: Regular rate and rhythm, no murmur GI/Abd soft, nontender, nondistended, bowel sound present CNS: Alert, awake, oriented x3 Psychiatry: Mood appropriate Extremities: No pedal edema, no calf tenderness  Data Review: I have personally reviewed the laboratory data and studies available.  F/u labs ordered [employer] (From admission, onward)     Start     Ordered  04/03/21 0500  Creatinine, serum  Tomorrow morning,   R        04/02/21 1357            Signed, Terrilee Croak, MD [facility] 04/02/2021

## 2021-04-02 NOTE — Plan of Care (Signed)

## 2021-04-02 NOTE — Progress Notes (Signed)
04/02/2021 8:26 AM  Laurie [name] [id number]  Feeling better this am, minimal pain, tongue movement improved    Temp:  [97.5 F (36.4 C)-98.1 F (36.7 C)] 97.5 F (36.4 C) (11/30 0758) Pulse Rate:  [49-91] 91 (11/30 0758) Resp:  [16-18] 18 (11/30 0758) BP: (156-175)/(79-99) 156/79 (11/30 0758) SpO2:  [97 %-99 %] 97 % (11/30 0758),     Intake/Output Summary (Last 24 hours) at 04/02/2021 0826 Last data filed at 04/01/2021 1851 Gross per 24 hour  Intake 120 ml  Output --  Net 120 ml    Results for orders placed or performed during the hospital encounter of 03/31/21 (from the past 24 hour(s))  CBC     Status: None   Collection Time: 04/02/21  6:19 AM  Result Value Ref Range   WBC 6.6 4.0 - 10.5 K/uL   RBC 4.14 3.87 - 5.11 MIL/uL   Hemoglobin 12.6 12.0 - 15.0 g/dL   HCT 38.2 36.0 - 46.0 %   MCV 92.3 80.0 - 100.0 fL   MCH 30.4 26.0 - 34.0 pg   MCHC 33.0 30.0 - 36.0 g/dL   RDW 14.3 11.5 - 15.5 %   Platelets 302 150 - 400 K/uL   nRBC 0.0 0.0 - 0.2 %  Basic metabolic panel     Status: Abnormal   Collection Time: 04/02/21  6:19 AM  Result Value Ref Range   Sodium 138 135 - 145 mmol/L   Potassium 3.4 (L) 3.5 - 5.1 mmol/L   Chloride 109 98 - 111 mmol/L   CO2 22 22 - 32 mmol/L   Glucose, Bld 140 (H) 70 - 99 mg/dL   BUN 14 8 - 23 mg/dL   Creatinine, Ser 0.66 0.44 - 1.00 mg/dL   Calcium 6.7 (L) 8.9 - 10.3 mg/dL   GFR, Estimated >60 >60 mL/min   Anion gap 7 5 - 15    SUBJECTIVE:  Much improved  OBJECTIVE:  Floor of mouth with minimal swelling, teeth still poor dentition with multiple infected teeth. Neck swelling/tenderness resolved.  IMPRESSION:  neck/tongue abscess with severe periodontal disease.  Feeling much better would like to try normal diet.  PLAN:  Cultures pending.  Consult placed yesterday for care management.  Patient feeling much better, floor or mouth swelling almost resolved, neck swelling resolved. Once final culture has given bacteria and  sensitivities, oral meds can be chosen.  Then she can be discharged on those meds.   She needs stat dental evaluation and tooth extractions or this will recur.  Care management has been asked to assist with the dental appointment as [name] doesn't think she can afford this cost, and possibly the discharge meds (likely augmentin and prednisone taper).  [name] will speak with family members as well about help with finding a dentist as she does not have one.    [name] 04/02/2021, 8:26 AM

## 2021-04-03 ENCOUNTER — Other Ambulatory Visit (HOSPITAL_COMMUNITY): Payer: Self-pay

## 2021-04-03 ENCOUNTER — Encounter: Payer: Self-pay | Admitting: Hematology and Oncology

## 2021-04-03 MED ORDER — CALCIUM GLUCONATE-NACL 2-0.675 GM/100ML-% IV SOLN
2.0000 g | Freq: Once | INTRAVENOUS | Status: AC
  Administered 2021-04-03: 2000 mg via INTRAVENOUS
  Filled 2021-04-03: qty 100

## 2021-04-03 MED ORDER — SACCHAROMYCES BOULARDII 250 MG PO CAPS: 250.0000 mg | ORAL_CAPSULE | Freq: Two times a day (BID) | ORAL | 0 refills | Status: AC

## 2021-04-03 MED ORDER — PREDNISONE 10 MG PO TABS: ORAL_TABLET | ORAL | 0 refills | Status: DC

## 2021-04-03 MED ORDER — AMOXICILLIN-POT CLAVULANATE 875-125 MG PO TABS: 1.0000 | ORAL_TABLET | Freq: Two times a day (BID) | ORAL | 0 refills | Status: AC

## 2021-04-03 MED ORDER — POTASSIUM CHLORIDE CRYS ER 20 MEQ PO TBCR
40.0000 meq | EXTENDED_RELEASE_TABLET | Freq: Once | ORAL | Status: AC
  Administered 2021-04-03: 40 meq via ORAL
  Filled 2021-04-03: qty 2

## 2021-04-03 NOTE — Care Management (Signed)
TOC consult for medication assistance Called patients pharmacy to determine cost of her discharge medications Augmentin $0 Prednisone $0 Florastor - over the counter  Patient notified Patient requested assistance with hospital bill.  Printed and provided f [employer] [facility] [employer]  Patient confirms that she has dental coverage, but does not have her card with her and can not recall the company.  I encourage patient to cal the number on the back of her card to inquire about an in network dentist

## 2021-04-03 NOTE — Discharge Summary (Signed)
Physician Discharge Summary  Laurie [name] JEW:[id number] DOB: Oct 14, 1945 DOA: 03/31/2021  PCP: Hillery Aldo, MD  Admit date: 03/31/2021 Discharge date: 04/03/2021  Admitted From: home Discharge disposition: home   Code Status: Full Code   Discharge Diagnosis:   Principal Problem:   Abscess of tongue Active Problems:   Multiple myeloma not having achieved remission (HCC)   Maintenance chemotherapy   Asthma   Depression    Chief Complaint  Patient presents with   Jaw Pain   Brief narrative: Laurie [name] is a 75 y.o. female with PMH significant for multiple myeloma on maintenance chemotherapy, asthma, anxiety/depression. Patient presented to the ED on 11/28 with 1 week history of progressively worsening pain on the left side of her mouth and neck that affected her swallowing as well.   CT soft tissue neck with contrast showed 4.5 cm fluid collection concerning for abscess within the tongue with significant surrounding edema Admitted to hospitalist service Started on broad-spectrum antibiotics.  ENT and IR consult were obtained. 11/28, patient underwent 2 mL of fluid aspiration by ultrasound guidance.  Subjective: Patient was seen and examined this morning. Sitting up in bed.  Not in distress.  No new symptoms.  Feels better and ready to go home.  Hospital course: Abscess of tongue Poor dental hygiene -Status post needle aspiration by IR on 11/28.   -ENT consult appreciated.  Currently on IV Decadron, IV Unasyn and IV vancomycin.   -Abscess culture grew Streptococcus constellatus.  Discussed with ENT Dr. Jenne Campus.  We will discharge the patient on Augmentin 875 mg p.o. twice daily for 2 weeks with probiotics and a tapering course of steroids.   -Patient has been extensively counseled that she needs to see a dentist as quickly as possible.  Multiple myeloma -continue to hold home dose of lenalidomide for now. Immunosuppressed. Management per onco outpatient  Recent  Labs  Lab 03/30/21 1646 04/01/21 0601 04/02/21 0619  WBC 6.8 6.3 6.6  NEUTROABS 5.1  --   --   HGB 13.4 12.5 12.6  HCT 39.7 39.0 38.2  MCV 92.1 93.8 92.3  PLT 207 258 302   Hypocalcemia -Calcium level running low, was low at 6.7 yesterday.  1 dose of IV calcium gluconate 2 g given. Recent Labs  Lab 03/30/21 1646 04/01/21 0601 04/02/21 0619  CALCIUM 7.8* 7.1* 6.7*   Asthma -Continue bronchodilators   Depression -Continue to hold home dose of sertraline    Allergies as of 04/03/2021       Reactions   Levofloxacin    Critical   Other Other (See Comments)   Antihistamines        Medication List     STOP taking these medications    dexamethasone 4 MG tablet Commonly known as: DECADRON   fluticasone-salmeterol 230-21 MCG/ACT inhaler Commonly known as: ADVAIR HFA   ondansetron 8 MG tablet Commonly known as: Zofran       TAKE these medications    acyclovir 400 MG tablet Commonly known as: ZOVIRAX TAKE 1 TABLET BY MOUTH TWICE A DAY   albuterol 108 (90 Base) MCG/ACT inhaler Commonly known as: VENTOLIN HFA 2 inhalations every 4 (four) hours as needed.   alendronate 70 MG tablet Commonly known as: FOSAMAX 70 mg once a week.   amoxicillin-clavulanate 875-125 MG tablet Commonly known as: Augmentin Take 1 tablet by mouth 2 (two) times daily for 14 days.   aspirin 81 MG chewable tablet Chew by mouth daily.   atorvastatin 20 MG tablet  Commonly known as: LIPITOR Take 20 mg by mouth daily.   busPIRone 10 MG tablet Commonly known as: BUSPAR Take 10 mg by mouth 2 (two) times daily.   CALCIUM 600 + D PO Take 600 mg by mouth 5 (five) times daily. Increase calcium from $RemoveBefo'600mg'YzXasUMMgSV$  4 times a day to $Remo'600mg'hPBic$  5 times a day starting today   docusate sodium 100 MG capsule Commonly known as: COLACE Take 100 mg by mouth daily.   lenalidomide 15 MG capsule Commonly known as: REVLIMID Take 1 capsule (15 mg total) by mouth daily. Take 1 tablet daily x 3 weeks and 1  week off    Authorization Number: [id number]  Date:03/04/2021   montelukast 10 MG tablet Commonly known as: SINGULAIR Take 10 mg by mouth daily after breakfast.   naproxen 500 MG tablet Commonly known as: NAPROSYN Take 500 mg by mouth 2 (two) times daily with a meal.   nortriptyline 25 MG capsule Commonly known as: PAMELOR Take 25 mg by mouth at bedtime.   omeprazole 20 MG capsule Commonly known as: PRILOSEC Take 20 mg by mouth daily.   predniSONE 10 MG tablet Commonly known as: DELTASONE Take 4 tablets daily X 3 days, then, Take 3 tablets daily X 3 days, then, Take 2 tablets daily X 3 days, then, Take 1 tablets daily X 3 day.   saccharomyces boulardii 250 MG capsule Commonly known as: FLORASTOR Take 1 capsule (250 mg total) by mouth 2 (two) times daily for 14 days.   sertraline 100 MG tablet Commonly known as: ZOLOFT Take 100 mg by mouth 2 (two) times daily.   traZODone 50 MG tablet Commonly known as: DESYREL Take 50 mg by mouth at bedtime.   Vitamin D (Ergocalciferol) 1.25 MG (50000 UNIT) Caps capsule Commonly known as: DRISDOL Take 50,000 Units by mouth every 7 (seven) days. Once a month        Discharge Instructions:  Diet Recommendation:  Discharge Diet Orders (From admission, onward)     Start     Ordered   04/03/21 0000  Diet general       Comments: Diet Orders (From admission, onward)    Start     Ordered   04/02/21 1347  Diet regular Room service appropriate? Yes; Fluid  consistency: Thin  Diet effective now       Question Answer Comment  Room service appropriate? Yes   Fluid consistency: Thin     04/02/21 1346       04/03/21 1237              '@BRDDSCINSTRUCTIONS'$ @  Follow ups:    Follow-up Information     Denton Lank, MD Follow up.   Specialty: Family Medicine Contact information: [address] [place] [address] [phone]         Beverly Gust, MD Follow up.   Specialty: Otolaryngology Contact  information: [address] [place] [address] [phone]                 Wound care:     Discharge Exam:   Vitals:   04/02/21 1646 04/02/21 1959 04/02/21 2143 04/03/21 0802  BP: (!) 171/79 (!) 168/144 (!) 147/84 (!) 148/89  Pulse: 87 94 89 82  Resp: $Remo'18 18  16  'nnXcd$ Temp: (!) 97.5 F (36.4 C) 97.6 F (36.4 C)  97.7 F (36.5 C)  TempSrc: Oral Oral    SpO2: 99% 97%  99%  Weight:  Height:        Body mass index is 30.62 kg/m.  General exam: Pleasant, elderly Caucasian female.  Not in distress Skin: No rashes, lesions or ulcers. HEENT: Atraumatic, normocephalic, no obvious bleeding.  Poor dental hygiene Lungs: Clear to auscultation bilaterally CVS: Regular rate and rhythm, no murmur GI/Abd soft, nontender, nondistended, bowel sound present CNS: Alert, awake, oriented x3 Psychiatry: Mood appropriate Extremities: No pedal edema, no calf tenderness  Time coordinating discharge: 35 minutes   The results of significant diagnostics from this hospitalization (including imaging, microbiology, ancillary and laboratory) are listed below for reference.    Procedures and Diagnostic Studies:   [place] Abscess Drain  Result Date: 03/31/2021 INDICATION: Sublingual abscess EXAM: Ultrasound-guided aspiration of sublingual abscess MEDICATIONS: None ANESTHESIA/SEDATION: Local analgesia COMPLICATIONS: None immediate. PROCEDURE: Informed written consent was obtained from the patient after a thorough discussion of the procedural risks, benefits and alternatives. All questions were addressed. Maximal Sterile Barrier Technique was utilized including caps, mask, sterile gowns, sterile gloves, sterile drape, hand hygiene and skin antiseptic. A timeout was performed prior to the initiation of the procedure. The patient was placed supine on the exam table. Limited ultrasound of the sublingual region was performed which demonstrated a complex and septated appearance of a  sublingual fluid collection. Skin entry site was marked, and the overlying skin was prepped and draped in a standard sterile fashion. Local analgesia was obtained with 1% lidocaine. Using ultrasound guidance, a 19 gauge Yueh catheter was advanced into the complex fluid collection. Approximately 2 mL of thick purulent fluid was aspirated. Due to the complex and septated nature of the collection, no further fluid was able to be aspirated percutaneously. Samples were sent to the lab for further analysis, including both microbiology and cytology. A clean dressing was placed. The patient tolerated the procedure well without immediate complication. IMPRESSION: Ultrasound-guided aspiration of sublingual abscess yielded approximately 2 mL of purulent material. Due to the thick and complex/septated nature of the collection, the abscess was unable to be completely decompressed percutaneously. Further recommendations per ENT. Electronically Signed   By: Albin Felling M.D.   On: 03/31/2021 15:45     Labs:   Basic Metabolic Panel: Recent Labs  Lab 03/30/21 1646 03/31/21 1203 04/01/21 0601 04/02/21 0619  NA 136  --  141 138  K 3.4*  --  3.9 3.4*  CL 104  --  111 109  CO2 22  --  21* 22  GLUCOSE 105*  --  118* 140*  BUN 16  --  15 14  CREATININE 0.78 0.81 0.73 0.66  CALCIUM 7.8*  --  7.1* 6.7*   GFR Estimated Creatinine Clearance: 66.8 mL/min (by C-G formula based on SCr of 0.66 mg/dL). Liver Function Tests: No results for input(s): AST, ALT, ALKPHOS, BILITOT, PROT, ALBUMIN in the last 168 hours. No results for input(s): LIPASE, AMYLASE in the last 168 hours. No results for input(s): AMMONIA in the last 168 hours. Coagulation profile Recent Labs  Lab 03/31/21 0329  INR 1.3*    CBC: Recent Labs  Lab 03/30/21 1646 04/01/21 0601 04/02/21 0619  WBC 6.8 6.3 6.6  NEUTROABS 5.1  --   --   HGB 13.4 12.5 12.6  HCT 39.7 39.0 38.2  MCV 92.1 93.8 92.3  PLT 207 258 302   Cardiac Enzymes: No  results for input(s): CKTOTAL, CKMB, CKMBINDEX, TROPONINI in the last 168 hours. BNP: Invalid input(s): POCBNP CBG: No results for input(s): GLUCAP in the last 168 hours. D-Dimer No  results for input(s): DDIMER in the last 72 hours. Hgb A1c No results for input(s): HGBA1C in the last 72 hours. Lipid Profile No results for input(s): CHOL, HDL, LDLCALC, TRIG, CHOLHDL, LDLDIRECT in the last 72 hours. Thyroid function studies No results for input(s): TSH, T4TOTAL, T3FREE, THYROIDAB in the last 72 hours.  Invalid input(s): FREET3 Anemia work up No results for input(s): VITAMINB12, FOLATE, FERRITIN, TIBC, IRON, RETICCTPCT in the last 72 hours. Microbiology Recent Results (from the past 240 hour(s))  Resp Panel by RT-PCR (Flu A&B, Covid) Nasopharyngeal Swab     Status: None   Collection Time: 03/31/21  3:19 AM   Specimen: Nasopharyngeal Swab; Nasopharyngeal(NP) swabs in vial transport medium  Result Value Ref Range Status   SARS Coronavirus 2 by RT PCR NEGATIVE NEGATIVE Final    Comment: (NOTE) SARS-CoV-2 target nucleic acids are NOT DETECTED.  The SARS-CoV-2 RNA is generally detectable in upper respiratory specimens during the acute phase of infection. The lowest concentration of SARS-CoV-2 viral copies this assay can detect is 138 copies/mL. A negative result does not preclude SARS-Cov-2 infection and should not be used as the sole basis for treatment or other patient management decisions. A negative result may occur with  improper specimen collection/handling, submission of specimen other than nasopharyngeal swab, presence of viral mutation(s) within the areas targeted by this assay, and inadequate number of viral copies(<138 copies/mL). A negative result must be combined with clinical observations, patient history, and epidemiological information. The expected result is Negative.  Fact Sheet for Patients:  EntrepreneurPulse.com.au  Fact Sheet for Healthcare  Providers:  IncredibleEmployment.be  This test is no t yet approved or cleared by the [place] FDA and  has been authorized for detection and/or diagnosis of SARS-CoV-2 by FDA under an Emergency Use Authorization (EUA). This EUA will remain  in effect (meaning this test can be used) for the duration of the COVID-19 declaration under Section 564(b)(1) of the Act, 21 U.S.C.section 360bbb-3(b)(1), unless the authorization is terminated  or revoked sooner.       Influenza A by PCR NEGATIVE NEGATIVE Final   Influenza B by PCR NEGATIVE NEGATIVE Final    Comment: (NOTE) The Xpert Xpress SARS-CoV-2/FLU/RSV plus assay is intended as an aid in the diagnosis of influenza from Nasopharyngeal swab specimens and should not be used as a sole basis for treatment. Nasal washings and aspirates are unacceptable for Xpert Xpress SARS-CoV-2/FLU/RSV testing.  Fact Sheet for Patients: EntrepreneurPulse.com.au  Fact Sheet for Healthcare Providers: IncredibleEmployment.be  This test is not yet approved or cleared by the [place] FDA and has been authorized for detection and/or diagnosis of SARS-CoV-2 by FDA under an Emergency Use Authorization (EUA). This EUA will remain in effect (meaning this test can be used) for the duration of the COVID-19 declaration under Section 564(b)(1) of the Act, 21 U.S.C. section 360bbb-3(b)(1), unless the authorization is terminated or revoked.  Performed at [facility], [address] [place], [place] [address]   Blood Culture (routine x 2)     Status: None (Preliminary result)   Collection Time: 03/31/21  3:20 AM   Specimen: BLOOD  Result Value Ref Range Status   Specimen Description BLOOD LEFT AC  Final   Special Requests   Final    BOTTLES DRAWN AEROBIC AND ANAEROBIC Blood Culture results may not be optimal due to an inadequate volume of blood received in culture bottles   Culture    Final    NO GROWTH 3 DAYS Performed at [facility]  [facility], [address], [place], [place] [address]    Report Status PENDING  Incomplete  Blood Culture (routine x 2)     Status: None (Preliminary result)   Collection Time: 03/31/21  3:20 AM   Specimen: BLOOD  Result Value Ref Range Status   Specimen Description BLOOD RIGHT [facility]  Final   Special Requests   Final    BOTTLES DRAWN AEROBIC AND ANAEROBIC Blood Culture adequate volume   Culture   Final    NO GROWTH 3 DAYS Performed at [facility], [address] [place], [place] [address]    Report Status PENDING  Incomplete  Aerobic/Anaerobic Culture w Gram Stain (surgical/deep wound)     Status: None (Preliminary result)   Collection Time: 03/31/21  3:26 PM   Specimen: Abscess  Result Value Ref Range Status   Specimen Description   Final    ABSCESS Performed at [facility], [address], [place], [place] [address]    Special Requests   Final    NONE Performed at [facility], [address] [place], [place] [address]    Gram Stain   Final    NO WBC SEEN ABUNDANT GRAM POSITIVE COCCI IN PAIRS AND CHAINS    Culture   Final    ABUNDANT STREPTOCOCCUS CONSTELLATUS HOLDING FOR POSSIBLE ANAEROBE Performed at [facility], [place] [address], [place],  [address]    Report Status PENDING  Incomplete   Organism ID, Bacteria STREPTOCOCCUS CONSTELLATUS  Final      Susceptibility   Streptococcus constellatus - MIC*    PENICILLIN <=0.06 SENSITIVE Sensitive     CEFTRIAXONE 0.25 SENSITIVE Sensitive     ERYTHROMYCIN >=8 RESISTANT Resistant     LEVOFLOXACIN <=0.25 SENSITIVE Sensitive     VANCOMYCIN 0.25 SENSITIVE Sensitive     * ABUNDANT STREPTOCOCCUS CONSTELLATUS     Signed: Johnathon Olden  [facility] 04/03/2021, 12:38 PM

## 2021-04-03 NOTE — Plan of Care (Signed)
Patient discharged to home with family.  Discharge instructions reviewed to include medication changes, appointment reminders, and care instructions.  Financial aid instructions/papers given.  Home medication returned  to patient as well as personal belongings given to patient prior to discharge.  Influenza injections given, patient tolerated well.  PIVx2 removed prior to discharge

## 2021-04-04 LAB — AEROBIC/ANAEROBIC CULTURE W GRAM STAIN (SURGICAL/DEEP WOUND): Gram Stain: NONE SEEN

## 2021-04-05 LAB — CULTURE, BLOOD (ROUTINE X 2)
Culture: NO GROWTH
Culture: NO GROWTH
Special Requests: ADEQUATE

## 2021-04-09 ENCOUNTER — Inpatient Hospital Stay (HOSPITAL_BASED_OUTPATIENT_CLINIC_OR_DEPARTMENT_OTHER): Payer: Medicare HMO | Admitting: Oncology

## 2021-04-09 ENCOUNTER — Encounter: Payer: Self-pay | Admitting: Oncology

## 2021-04-09 ENCOUNTER — Other Ambulatory Visit: Payer: Self-pay

## 2021-04-09 ENCOUNTER — Inpatient Hospital Stay: Payer: Medicare HMO | Attending: Oncology

## 2021-04-09 ENCOUNTER — Inpatient Hospital Stay: Payer: Medicare HMO

## 2021-04-09 VITALS — BP 137/87 | HR 103 | Temp 97.9°F | Resp 16 | Ht 65.0 in | Wt 176.6 lb

## 2021-04-09 DIAGNOSIS — K056 Periodontal disease, unspecified: Secondary | ICD-10-CM | POA: Insufficient documentation

## 2021-04-09 DIAGNOSIS — Z7982 Long term (current) use of aspirin: Secondary | ICD-10-CM | POA: Diagnosis not present

## 2021-04-09 DIAGNOSIS — Z5112 Encounter for antineoplastic immunotherapy: Secondary | ICD-10-CM | POA: Insufficient documentation

## 2021-04-09 DIAGNOSIS — Z7961 Long term (current) use of immunomodulator: Secondary | ICD-10-CM | POA: Diagnosis not present

## 2021-04-09 DIAGNOSIS — C9000 Multiple myeloma not having achieved remission: Secondary | ICD-10-CM | POA: Diagnosis not present

## 2021-04-09 DIAGNOSIS — Z79899 Other long term (current) drug therapy: Secondary | ICD-10-CM | POA: Insufficient documentation

## 2021-04-09 DIAGNOSIS — Z9221 Personal history of antineoplastic chemotherapy: Secondary | ICD-10-CM | POA: Insufficient documentation

## 2021-04-09 DIAGNOSIS — Z5111 Encounter for antineoplastic chemotherapy: Secondary | ICD-10-CM

## 2021-04-09 DIAGNOSIS — K122 Cellulitis and abscess of mouth: Secondary | ICD-10-CM | POA: Diagnosis not present

## 2021-04-09 LAB — CBC WITH DIFFERENTIAL/PLATELET
Abs Immature Granulocytes: 0.1 10*3/uL — ABNORMAL HIGH (ref 0.00–0.07)
Basophils Absolute: 0.1 10*3/uL (ref 0.0–0.1)
Basophils Relative: 1 %
Eosinophils Absolute: 0 10*3/uL (ref 0.0–0.5)
Eosinophils Relative: 1 %
HCT: 43.7 % (ref 36.0–46.0)
Hemoglobin: 15 g/dL (ref 12.0–15.0)
Immature Granulocytes: 1 %
Lymphocytes Relative: 8 %
Lymphs Abs: 0.6 10*3/uL — ABNORMAL LOW (ref 0.7–4.0)
MCH: 31.2 pg (ref 26.0–34.0)
MCHC: 34.3 g/dL (ref 30.0–36.0)
MCV: 90.9 fL (ref 80.0–100.0)
Monocytes Absolute: 0.7 10*3/uL (ref 0.1–1.0)
Monocytes Relative: 9 %
Neutro Abs: 6.9 10*3/uL (ref 1.7–7.7)
Neutrophils Relative %: 80 %
Platelets: 352 10*3/uL (ref 150–400)
RBC: 4.81 MIL/uL (ref 3.87–5.11)
RDW: 14.4 % (ref 11.5–15.5)
WBC: 8.4 10*3/uL (ref 4.0–10.5)
nRBC: 0 % (ref 0.0–0.2)

## 2021-04-09 LAB — COMPREHENSIVE METABOLIC PANEL
ALT: 17 U/L (ref 0–44)
AST: 12 U/L — ABNORMAL LOW (ref 15–41)
Albumin: 3.7 g/dL (ref 3.5–5.0)
Alkaline Phosphatase: 89 U/L (ref 38–126)
Anion gap: 18 — ABNORMAL HIGH (ref 5–15)
BUN: 20 mg/dL (ref 8–23)
CO2: 18 mmol/L — ABNORMAL LOW (ref 22–32)
Calcium: 7.1 mg/dL — ABNORMAL LOW (ref 8.9–10.3)
Chloride: 102 mmol/L (ref 98–111)
Creatinine, Ser: 0.83 mg/dL (ref 0.44–1.00)
GFR, Estimated: >60 mL/min (ref >60)
Glucose, Bld: 106 mg/dL — ABNORMAL HIGH (ref 70–99)
Potassium: 3.1 mmol/L — ABNORMAL LOW (ref 3.5–5.1)
Sodium: 138 mmol/L (ref 135–145)
Total Bilirubin: 1 mg/dL (ref 0.3–1.2)
Total Protein: 6.2 g/dL — ABNORMAL LOW (ref 6.5–8.1)

## 2021-04-09 NOTE — Progress Notes (Signed)
Hematology/Oncology Consult note [facility]  Telephone:([phone] [phone]  Patient Care Team: Denton Lank, MD as PCP - General (Family Medicine)   Name of the patient: Laurie [name]  [id number]  1945/09/26   Date of visit: 04/09/21  Diagnosis- stage III biclonal IgA multiple myeloma    Chief complaint/ Reason for visit- routine follow-up of multiple myeloma to receive Darzalex Xgeva and B12 injection  Heme/Onc history: Patient is a 75 year old female who was diagnosed with stage III biclonal IgA multiple myeloma in June 2021. SPEP was 5.0 gm/dL on 10/03/2019. Random urine revealed 196.6 mg/dL total protein with 58.3% M-spike.  Bone marrow biopsy on 11/13/2019 revealed a hypercellular bone marrow with extensive involvement by plasma cell neoplasm. Atypical plasma cells represented 70% of all cells in the aspirate and was associated with prominent interstitial infiltrates and diffuse sheets in the clot.  Plasma cells were kappa light chain restricted.   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   Bone survey on 10/26/2019 revealed small lytic lesions in the skull. There was no acute fracture.   Patient has been on daratumumab Revlimid dexamethasone regimen since July 2021.  She briefly received Retacrit in the past but has not required it for over 1 year now.   Patient is a Sales promotion account executive [employer]    Interval history-patient recently admitted to the hospital forNeck/tongue abscess due to severe periodontal disease and required abscess drainage and IV antibiotics.  Patient still has 1 more week of IV antibiotics remaining.  She will follow-up with Dr. Magda Kiel next week as well.  Reports  improvement in her swallowing and speech although still has pain during swallowing.  ECOG PS- 1 Pain scale- 3 Opioid associated constipation- no  Review of systems- Review of Systems  Constitutional:  Positive for malaise/fatigue. Negative for chills, fever and weight loss.  HENT:  Negative for congestion, ear discharge and nosebleeds.        Pain during swallowing  Eyes:  Negative for blurred vision.  Respiratory:  Negative for cough, hemoptysis, sputum production, shortness of breath and wheezing.   Cardiovascular:  Negative for chest pain, palpitations, orthopnea and claudication.  Gastrointestinal:  Negative for abdominal pain, blood in stool, constipation, diarrhea, heartburn, melena, nausea and vomiting.  Genitourinary:  Negative for dysuria, flank pain, frequency, hematuria and urgency.  Musculoskeletal:  Negative for back pain, joint pain and myalgias.  Skin:  Negative for rash.  Neurological:  Negative for dizziness, tingling, focal weakness, seizures, weakness and headaches.  Endo/Heme/Allergies:  Does not bruise/bleed easily.  Psychiatric/Behavioral:  Negative for depression and suicidal ideas. The patient does not have insomnia.      Allergies  Allergen Reactions   Levofloxacin     Critical    Other Other (See Comments)    Antihistamines     Past Medical History:  Diagnosis Date   Anxiety    Asthma    Depression    Head injury    Multiple myeloma ([place])    Osteoporosis      Past Surgical History:  Procedure Laterality Date   ABDOMINAL HYSTERECTOMY     HEMORROIDECTOMY     SEPTOPLASTY      Social History   Socioeconomic History   Marital status: Widowed  Spouse name: Not on file   Number of children: Not on file   Years of education: Not on file   Highest education level: Not on file  Occupational History   Not on file  Tobacco Use   Smoking status: Never   Smokeless tobacco: Never  Vaping Use   Vaping Use: Not on file  Substance and Sexual  Activity   Alcohol use: No   Drug use: No   Sexual activity: Not on file  Other Topics Concern   Not on file  Social History Narrative   Not on file   Social Determinants of Health   Financial Resource Strain: Not on file  Food Insecurity: Not on file  Transportation Needs: Not on file  Physical Activity: Not on file  Stress: Not on file  Social Connections: Not on file  Intimate Partner Violence: Not on file    Family History  Problem Relation Age of Onset   Breast cancer Cousin        mat cousin     Current Outpatient Medications:    acyclovir (ZOVIRAX) 400 MG tablet, TAKE 1 TABLET BY MOUTH TWICE A DAY, Disp: 180 tablet, Rfl: 3   albuterol (VENTOLIN HFA) 108 (90 Base) MCG/ACT inhaler, 2 inhalations every 4 (four) hours as needed., Disp: , Rfl:    alendronate (FOSAMAX) 70 MG tablet, 70 mg once a week., Disp: , Rfl:    amoxicillin-clavulanate (AUGMENTIN) 875-125 MG tablet, Take 1 tablet by mouth 2 (two) times daily for 14 days., Disp: 28 tablet, Rfl: 0   aspirin 81 MG chewable tablet, Chew by mouth daily., Disp: , Rfl:    atorvastatin (LIPITOR) 20 MG tablet, Take 20 mg by mouth daily., Disp: , Rfl:    busPIRone (BUSPAR) 10 MG tablet, Take 10 mg by mouth 2 (two) times daily., Disp: , Rfl:    Calcium Carb-Cholecalciferol (CALCIUM 600 + D PO), Take 600 mg by mouth 5 (five) times daily. Increase calcium from 640m 4 times a day to 6011m5 times a day starting today, Disp: , Rfl:    docusate sodium (COLACE) 100 MG capsule, Take 100 mg by mouth daily., Disp: , Rfl:    lenalidomide (REVLIMID) 15 MG capsule, Take 1 capsule (15 mg total) by mouth daily. Take 1 tablet daily x 3 weeks and 1 week off    Authorization Number: [id number]:03/04/2021, Disp: 21 capsule, Rfl: 0   montelukast (SINGULAIR) 10 MG tablet, Take 10 mg by mouth daily after breakfast., Disp: , Rfl:    naproxen (NAPROSYN) 500 MG tablet, Take 500 mg by mouth 2 (two) times daily with a meal., Disp: , Rfl:     nortriptyline (PAMELOR) 25 MG capsule, Take 25 mg by mouth at bedtime., Disp: , Rfl:    omeprazole (PRILOSEC) 20 MG capsule, Take 20 mg by mouth daily., Disp: , Rfl:    predniSONE (DELTASONE) 10 MG tablet, Take 4 tablets daily X 3 days, then, Take 3 tablets daily X 3 days, then, Take 2 tablets daily X 3 days, then, Take 1 tablets daily X 3 day., Disp: 19 tablet, Rfl: 0   saccharomyces boulardii (FLORASTOR) 250 MG capsule, Take 1 capsule (250 mg total) by mouth 2 (two) times daily for 14 days., Disp: 28 capsule, Rfl: 0   sertraline (ZOLOFT) 100 MG tablet, Take 100 mg by mouth 2 (two) times daily., Disp: , Rfl:    traZODone (DESYREL) 50 MG tablet, Take 50 mg by mouth at bedtime., Disp: , Rfl:  Vitamin D, Ergocalciferol, (DRISDOL) 50000 UNITS CAPS capsule, Take 50,000 Units by mouth every 7 (seven) days. Once a month, Disp: , Rfl:   Physical exam:  Vitals:   04/09/21 0846  BP: 137/87  Pulse: (!) 103  Resp: 16  Temp: 97.9 F (36.6 C)  TempSrc: Tympanic  Weight: 176 lb 9.6 oz (80.1 kg)  Height: 5' 5" (1.651 m)   Physical Exam HENT:     Mouth/Throat:     Mouth: Mucous membranes are moist.     Pharynx: Oropharynx is clear.  Neck:     Comments: No significant neck swelling Cardiovascular:     Rate and Rhythm: Normal rate and regular rhythm.     Heart sounds: Normal heart sounds.  Pulmonary:     Effort: Pulmonary effort is normal.     Breath sounds: Normal breath sounds.  Abdominal:     General: Bowel sounds are normal.     Palpations: Abdomen is soft.  Skin:    General: Skin is warm and dry.  Neurological:     Mental Status: She is alert and oriented to person, place, and time.     CMP Latest Ref Rng & Units 04/02/2021  Glucose 70 - 99 mg/dL 140(H)  BUN 8 - 23 mg/dL 14  Creatinine 0.44 - 1.00 mg/dL 0.66  Sodium 135 - 145 mmol/L 138  Potassium 3.5 - 5.1 mmol/L 3.4(L)  Chloride 98 - 111 mmol/L 109  CO2 22 - 32 mmol/L 22  Calcium 8.9 - 10.3 mg/dL 6.7(L)  Total Protein 6.5 -  8.1 g/dL -  Total Bilirubin 0.3 - 1.2 mg/dL -  Alkaline Phos 38 - 126 U/L -  AST 15 - 41 U/L -  ALT 0 - 44 U/L -   CBC Latest Ref Rng & Units 04/02/2021  WBC 4.0 - 10.5 K/uL 6.6  Hemoglobin 12.0 - 15.0 g/dL 12.6  Hematocrit 36.0 - 46.0 % 38.2  Platelets 150 - 400 K/uL 302    No images are attached to the encounter.  CT Soft Tissue Neck W Contrast  Result Date: 03/30/2021 CLINICAL DATA:  Abscess?, left jaw pain EXAM: CT NECK WITH CONTRAST TECHNIQUE: Multidetector CT imaging of the neck was performed using the standard protocol following the bolus administration of intravenous contrast. CONTRAST:  34m OMNIPAQUE IOHEXOL 300 MG/ML  SOLN COMPARISON:  None. FINDINGS: Pharynx and larynx: Within the tongue, there is a low-density collection with peripheral enhancement measuring 4.5 x 3.1 x 4.0 cm (AP x TR x CC) (series 2, image 42 and series 6, image 25), concerning for abscess. There is significant surrounding edema. The pharynx and larynx are otherwise normal. Salivary glands: No inflammation, mass, or stone. Thyroid: Normal. Lymph nodes: Prominent right 1B lymph node measuring up to 8 mm in short axis (series 2, image 42) no other enlarged or abnormal appearing lymph nodes. Vascular: Negative. Limited intracranial: Negative. Visualized orbits: Negative. Mastoids and visualized paranasal sinuses: Mucosal thickening in the right maxillary sinus. The mastoids are well aerated. Skeleton: No acute osseous abnormality. Some lytic lesions are seen in the inferior calvarium, consistent with the patient's diagnosis of multiple myeloma. Upper chest: No focal pulmonary opacity or pleural effusion. Other: None. IMPRESSION: Low-density collection within the tongue, with peripheral enhancement, measuring to 4.5 cm and concerning for abscess, with surrounding edema and possible reactive right level 1B lymph node. Electronically Signed   By: AMerilyn BabaM.D.   On: 03/30/2021 23:44   [place] Drain  Result  Date: 03/31/2021 INDICATION:  Sublingual abscess EXAM: Ultrasound-guided aspiration of sublingual abscess MEDICATIONS: None ANESTHESIA/SEDATION: Local analgesia COMPLICATIONS: None immediate. PROCEDURE: Informed written consent was obtained from the patient after a thorough discussion of the procedural risks, benefits and alternatives. All questions were addressed. Maximal Sterile Barrier Technique was utilized including caps, mask, sterile gowns, sterile gloves, sterile drape, hand hygiene and skin antiseptic. A timeout was performed prior to the initiation of the procedure. The patient was placed supine on the exam table. Limited ultrasound of the sublingual region was performed which demonstrated a complex and septated appearance of a sublingual fluid collection. Skin entry site was marked, and the overlying skin was prepped and draped in a standard sterile fashion. Local analgesia was obtained with 1% lidocaine. Using ultrasound guidance, a 19 gauge Yueh catheter was advanced into the complex fluid collection. Approximately 2 mL of thick purulent fluid was aspirated. Due to the complex and septated nature of the collection, no further fluid was able to be aspirated percutaneously. Samples were sent to the lab for further analysis, including both microbiology and cytology. A clean dressing was placed. The patient tolerated the procedure well without immediate complication. IMPRESSION: Ultrasound-guided aspiration of sublingual abscess yielded approximately 2 mL of purulent material. Due to the thick and complex/septated nature of the collection, the abscess was unable to be completely decompressed percutaneously. Further recommendations per ENT. Electronically Signed   By: Albin Felling M.D.   On: 03/31/2021 15:45   DG Bone Survey Met  Result Date: 03/21/2021 CLINICAL DATA:  Multiple myeloma EXAM: METASTATIC BONE SURVEY COMPARISON:  09/25/2020 FINDINGS: Multiple lytic lesions are seen throughout the  calvarium which appear grossly stable in size and number since prior examination. Mild degenerative changes are seen within the cervical, thoracic, lumbar spine. Vascular calcifications over within the abdominal aorta. Degenerative changes noted within the acromioclavicular articulation bilaterally. Mild-to-moderate right and minimal left knee degenerative arthritis noted. IMPRESSION: Stable lytic lesions within the calvarium in keeping with the given history of multiple myeloma. No additional lytic lesions identified. Electronically Signed   By: Fidela Salisbury M.D.   On: 03/21/2021 20:36     Assessment and plan- Patient is a 75 y.o. 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  She is here for on treatment assessment prior to next cycle of Darzalex  Patient was recently discharged for throat/tongue abscess secondary to severe periodontal disease.  She has not yet completed her antibiotic course and I will therefore hold off on giving her Darzalex today.  She will be seen by covering NP in 2 weeks with labs CBC with differential CMP for consideration of Darzalex.  We will need to obtain ENT notes to make sure it would be okay for [place] to restart Darzalex at that time.  We will also restart Revlimid with Darzalex in 2 weeks.  Patient will receive her B12 injection in 2 weeks as well.  I am going to hold off on giving her any further Xgeva at this time given her severe periodontal disease until she has a dental assessment later this month and it is been deemed safe to give her further bisphosphonates.   Visit Diagnosis 1. Multiple myeloma not having achieved remission ([place])   2. Encounter for antineoplastic chemotherapy      Dr. Randa Evens, MD, MPH [facility] at [facility] [phone] 04/09/2021 7:39 AM

## 2021-04-09 NOTE — Progress Notes (Signed)
Pt here for treatment. She is still struggling with abcess, still on atb, she is drinking broth and other liq. And not enough she says. I have offered to her to drink boost or ensure. She  says it is too thick right now for her to get down. Suggested that she put milk in it and make it thinner. She has broth that has extra nutrients. She was having diarrhea from the atb that she  is on. Also she has upcoming appt for ENT and dentist due to her abcess

## 2021-04-10 ENCOUNTER — Other Ambulatory Visit: Payer: Self-pay | Admitting: Oncology

## 2021-04-10 LAB — KAPPA/LAMBDA LIGHT CHAINS
Kappa free light chain: 5.8 mg/L (ref 3.3–19.4)
Kappa, lambda light chain ratio: 1.87 — ABNORMAL HIGH (ref 0.26–1.65)
Lambda free light chains: 3.1 mg/L — ABNORMAL LOW (ref 5.7–26.3)

## 2021-04-14 LAB — MULTIPLE MYELOMA PANEL, SERUM
Albumin SerPl Elph-Mcnc: 3.2 g/dL (ref 2.9–4.4)
Albumin/Glob SerPl: 1.6 (ref 0.7–1.7)
Alpha 1: 0.2 g/dL (ref 0.0–0.4)
Alpha2 Glob SerPl Elph-Mcnc: 0.8 g/dL (ref 0.4–1.0)
B-Globulin SerPl Elph-Mcnc: 0.8 g/dL (ref 0.7–1.3)
Gamma Glob SerPl Elph-Mcnc: 0.3 g/dL — ABNORMAL LOW (ref 0.4–1.8)
Globulin, Total: 2.1 g/dL — ABNORMAL LOW (ref 2.2–3.9)
IgA: 75 mg/dL (ref 64–422)
IgG (Immunoglobin G), Serum: 253 mg/dL — ABNORMAL LOW (ref 586–1602)
IgM (Immunoglobulin M), Srm: 59 mg/dL (ref 26–217)
M Protein SerPl Elph-Mcnc: 0.1 g/dL — ABNORMAL HIGH
Total Protein ELP: 5.3 g/dL — ABNORMAL LOW (ref 6.0–8.5)

## 2021-04-18 ENCOUNTER — Telehealth: Payer: Self-pay | Admitting: *Deleted

## 2021-04-18 NOTE — Telephone Encounter (Signed)
Patient called asking if she is to restart her Steroids or not. She was told to stop them at discharge from hospital due to being on other steroids. Please advise

## 2021-04-21 ENCOUNTER — Telehealth: Payer: Self-pay | Admitting: *Deleted

## 2021-04-21 NOTE — Telephone Encounter (Signed)
04/09/21 MD note:  She will be seen by covering NP in 2 weeks with labs CBC with differential CMP for consideration of Darzalex.  We will also restart Revlimid with Darzalex in 2 weeks.

## 2021-04-21 NOTE — Telephone Encounter (Signed)
Called pt to see how she is doing with her abscess. She says it is getting better but still has tongue swollen. She is on atb still. I spoke to Dr. Tasia Catchings and she states that she should not have the chemo this week and we will make her a new appt. She is agreeable to having appt for next week. I will call her and let her know about the new appt

## 2021-04-23 ENCOUNTER — Inpatient Hospital Stay: Payer: Medicare HMO | Admitting: Oncology

## 2021-04-23 ENCOUNTER — Inpatient Hospital Stay: Payer: Medicare HMO

## 2021-04-24 ENCOUNTER — Other Ambulatory Visit: Payer: Self-pay | Admitting: *Deleted

## 2021-04-24 ENCOUNTER — Telehealth: Payer: Self-pay | Admitting: Nurse Practitioner

## 2021-04-24 NOTE — Telephone Encounter (Signed)
Pt called to say that she is not on antibiotic. Will be here for her appt on 12-30

## 2021-04-24 NOTE — Telephone Encounter (Signed)
Thanks

## 2021-04-29 ENCOUNTER — Telehealth: Payer: Self-pay | Admitting: *Deleted

## 2021-04-29 NOTE — Telephone Encounter (Signed)
I had spoke to Dr Tasia Catchings about her last week because she was still on atb and still having swelling in mouth but better, she said to hold the treatment and r/s for next week. The pt. Told me that she has 7 tablets of revlimid on hand because of her having to hold it with the infection

## 2021-04-29 NOTE — Telephone Encounter (Signed)
I called the pt and wanted to know what dentist has said last wed. She states that mouth is ok, no more atb needed. She states that dentist said that pt had 2 teeth that will need to be taken out by oral surgeon and she was told that it is was because of the fosamax she was on for a long time and it destroyed 2 teeth. She has app with oral surgeon as new pt 05/15/2021

## 2021-05-02 ENCOUNTER — Inpatient Hospital Stay: Payer: Medicare HMO

## 2021-05-02 ENCOUNTER — Other Ambulatory Visit: Payer: Self-pay

## 2021-05-02 ENCOUNTER — Encounter: Payer: Self-pay | Admitting: Hematology and Oncology

## 2021-05-02 ENCOUNTER — Inpatient Hospital Stay (HOSPITAL_BASED_OUTPATIENT_CLINIC_OR_DEPARTMENT_OTHER): Payer: Medicare HMO | Admitting: Nurse Practitioner

## 2021-05-02 VITALS — BP 124/77 | HR 72 | Temp 97.3°F | Resp 16 | Wt 177.7 lb

## 2021-05-02 VITALS — BP 130/70 | HR 92 | Resp 18

## 2021-05-02 DIAGNOSIS — C9000 Multiple myeloma not having achieved remission: Secondary | ICD-10-CM

## 2021-05-02 DIAGNOSIS — Z79899 Other long term (current) drug therapy: Secondary | ICD-10-CM

## 2021-05-02 DIAGNOSIS — M87180 Osteonecrosis due to drugs, jaw: Secondary | ICD-10-CM

## 2021-05-02 DIAGNOSIS — Z5181 Encounter for therapeutic drug level monitoring: Secondary | ICD-10-CM

## 2021-05-02 DIAGNOSIS — Z5112 Encounter for antineoplastic immunotherapy: Secondary | ICD-10-CM | POA: Diagnosis not present

## 2021-05-02 DIAGNOSIS — T458X5A Adverse effect of other primarily systemic and hematological agents, initial encounter: Secondary | ICD-10-CM

## 2021-05-02 DIAGNOSIS — D649 Anemia, unspecified: Secondary | ICD-10-CM

## 2021-05-02 DIAGNOSIS — Z5111 Encounter for antineoplastic chemotherapy: Secondary | ICD-10-CM

## 2021-05-02 LAB — CBC WITH DIFFERENTIAL/PLATELET
Abs Immature Granulocytes: 0.06 10*3/uL (ref 0.00–0.07)
Basophils Absolute: 0 10*3/uL (ref 0.0–0.1)
Basophils Relative: 1 %
Eosinophils Absolute: 0.2 10*3/uL (ref 0.0–0.5)
Eosinophils Relative: 3 %
HCT: 39.8 % (ref 36.0–46.0)
Hemoglobin: 13.2 g/dL (ref 12.0–15.0)
Immature Granulocytes: 1 %
Lymphocytes Relative: 5 %
Lymphs Abs: 0.4 10*3/uL — ABNORMAL LOW (ref 0.7–4.0)
MCH: 30.8 pg (ref 26.0–34.0)
MCHC: 33.2 g/dL (ref 30.0–36.0)
MCV: 92.8 fL (ref 80.0–100.0)
Monocytes Absolute: 0.6 10*3/uL (ref 0.1–1.0)
Monocytes Relative: 7 %
Neutro Abs: 6.3 10*3/uL (ref 1.7–7.7)
Neutrophils Relative %: 83 %
Platelets: 292 10*3/uL (ref 150–400)
RBC: 4.29 MIL/uL (ref 3.87–5.11)
RDW: 14.3 % (ref 11.5–15.5)
WBC: 7.5 10*3/uL (ref 4.0–10.5)
nRBC: 0 % (ref 0.0–0.2)

## 2021-05-02 LAB — COMPREHENSIVE METABOLIC PANEL
ALT: 21 U/L (ref 0–44)
AST: 16 U/L (ref 15–41)
Albumin: 3.3 g/dL — ABNORMAL LOW (ref 3.5–5.0)
Alkaline Phosphatase: 159 U/L — ABNORMAL HIGH (ref 38–126)
Anion gap: 7 (ref 5–15)
BUN: 8 mg/dL (ref 8–23)
CO2: 25 mmol/L (ref 22–32)
Calcium: 7.9 mg/dL — ABNORMAL LOW (ref 8.9–10.3)
Chloride: 104 mmol/L (ref 98–111)
Creatinine, Ser: 0.88 mg/dL (ref 0.44–1.00)
GFR, Estimated: >60 mL/min (ref >60)
Glucose, Bld: 111 mg/dL — ABNORMAL HIGH (ref 70–99)
Potassium: 3.9 mmol/L (ref 3.5–5.1)
Sodium: 136 mmol/L (ref 135–145)
Total Bilirubin: 0.4 mg/dL (ref 0.3–1.2)
Total Protein: 5.9 g/dL — ABNORMAL LOW (ref 6.5–8.1)

## 2021-05-02 MED ORDER — CYANOCOBALAMIN 1000 MCG/ML IJ SOLN
1000.0000 ug | Freq: Once | INTRAMUSCULAR | Status: AC
  Administered 2021-05-02: 10:00:00 1000 ug via INTRAMUSCULAR
  Filled 2021-05-02: qty 1

## 2021-05-02 MED ORDER — ONDANSETRON HCL 4 MG PO TABS
8.0000 mg | ORAL_TABLET | Freq: Once | ORAL | Status: AC
  Administered 2021-05-02: 10:00:00 8 mg via ORAL
  Filled 2021-05-02: qty 2

## 2021-05-02 MED ORDER — DEXAMETHASONE 4 MG PO TABS
20.0000 mg | ORAL_TABLET | Freq: Once | ORAL | Status: AC
  Administered 2021-05-02: 10:00:00 20 mg via ORAL
  Filled 2021-05-02: qty 5

## 2021-05-02 MED ORDER — LENALIDOMIDE 15 MG PO CAPS: 15.0000 mg | ORAL_CAPSULE | Freq: Every day | ORAL | 0 refills | Status: DC

## 2021-05-02 MED ORDER — ACETAMINOPHEN 325 MG PO TABS
650.0000 mg | ORAL_TABLET | Freq: Once | ORAL | Status: AC
  Administered 2021-05-02: 10:00:00 650 mg via ORAL
  Filled 2021-05-02: qty 2

## 2021-05-02 MED ORDER — DARATUMUMAB-HYALURONIDASE-FIHJ 1800-30000 MG-UT/15ML ~~LOC~~ SOLN
1800.0000 mg | Freq: Once | SUBCUTANEOUS | Status: AC
  Administered 2021-05-02: 11:00:00 1800 mg via SUBCUTANEOUS
  Filled 2021-05-02: qty 15

## 2021-05-02 MED ORDER — DIPHENHYDRAMINE HCL 25 MG PO CAPS
50.0000 mg | ORAL_CAPSULE | Freq: Once | ORAL | Status: AC
  Administered 2021-05-02: 10:00:00 50 mg via ORAL
  Filled 2021-05-02: qty 2

## 2021-05-02 NOTE — Patient Instructions (Signed)
[facility] AT [place]   Discharge Instructions: Thank you for choosing [facility] to provide your oncology and hematology care.  If you have a lab appointment with the [facility], please go directly to the [facility] and check in at the registration area.   We strive to give you quality time with your provider. You may need to reschedule your appointment if you arrive late (15 or more minutes).  Arriving late affects you and other patients whose appointments are after yours.  Also, if you miss three or more appointments without notifying the office, you may be dismissed from the clinic at the providers discretion.      For prescription refill requests, have your pharmacy contact our office and allow 72 hours for refills to be completed.    Today you received the following chemotherapy and/or immunotherapy agents: Darzalex Faspro. Today you also received the following: Vitamin B-12 injection.      To help prevent nausea and vomiting after your treatment, we encourage you to take your nausea medication as directed.  BELOW ARE SYMPTOMS THAT SHOULD BE REPORTED IMMEDIATELY: *FEVER GREATER THAN 100.4 F (38 C) OR HIGHER *CHILLS OR SWEATING *NAUSEA AND VOMITING THAT IS NOT CONTROLLED WITH YOUR NAUSEA MEDICATION *UNUSUAL SHORTNESS OF BREATH *UNUSUAL BRUISING OR BLEEDING *URINARY PROBLEMS (pain or burning when urinating, or frequent urination) *BOWEL PROBLEMS (unusual diarrhea, constipation, pain near the anus) TENDERNESS IN MOUTH AND THROAT WITH OR WITHOUT PRESENCE OF ULCERS (sore throat, sores in mouth, or a toothache) UNUSUAL RASH, SWELLING OR PAIN  UNUSUAL VAGINAL DISCHARGE OR ITCHING   Items with * indicate a potential emergency and should be followed up as soon as possible or go to the Emergency Department if any problems should occur.  Please show the CHEMOTHERAPY ALERT CARD or IMMUNOTHERAPY ALERT CARD at check-in to the Emergency Department and  triage nurse.  Should you have questions after your visit or need to cancel or reschedule your appointment, please contact [facility] [place]  Dept: [phone]  and follow the prompts.  Office hours are 8:00 a.m. to 4:30 p.m. Monday - Friday. Please note that voicemails left after 4:00 p.m. may not be returned until the following business day.  We are closed weekends and major holidays. You have access to a nurse at all times for urgent questions. Please call the main number to the clinic Dept: [phone] and follow the prompts.  For any non-urgent questions, you may also contact your provider using MyChart. We now offer e-Visits for anyone 60 and older to request care online for non-urgent symptoms. For details visit [email or web address].   Also download the MyChart app! Go to the app store, search "MyChart", open the app, select [facility], and log in with your MyChart username and password.  Due to Covid, a mask is required upon entering the hospital/clinic. If you do not have a mask, one will be given to you upon arrival. For doctor visits, patients may have 1 support person aged 52 or older with them. For treatment visits, patients cannot have anyone with them due to current Covid guidelines and our immunocompromised population.

## 2021-05-02 NOTE — Progress Notes (Addendum)
Hematology/Oncology Consult Note [facility]  [phone] Fax:([phone]  Patient Care Team: Denton Lank, MD as PCP - General (Family Medicine)   Name of the patient: Laurie [name]  [id number]  31-Mar-1946   Date of visit: 05/02/21  Diagnosis- stage III biclonal IgA multiple myeloma   Chief complaint/ Reason for visit- routine follow-up of multiple myeloma to receive Darzalex Xgeva and B12 injection  Heme/Onc history: Patient is a 75 year old female who was diagnosed with stage III biclonal IgA multiple myeloma in June 2021. SPEP was 5.0 gm/dL on 10/03/2019. Random urine revealed 196.6 mg/dL total protein with 58.3% M-spike.  Bone marrow biopsy on 11/13/2019 revealed a hypercellular bone marrow with extensive involvement by plasma cell neoplasm. Atypical plasma cells represented 70% of all cells in the aspirate and was associated with prominent interstitial infiltrates and diffuse sheets in the clot.  Plasma cells were kappa light chain restricted.   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   Bone survey on 10/26/2019 revealed small lytic lesions in the skull. There was no acute fracture.   Patient has been on daratumumab Revlimid dexamethasone regimen since July 2021.  She briefly received Retacrit in the past but has not required it for over 1 year now.   Patient is a Sales promotion account executive Witness   Interval history- Patient returns to clinic for consideration of daratumumab and b12. She complains of ongoing difficulty swallowing d/t dental abscess. She continues oral antibiotics and is followed by Dr. Magda Kiel.   ECOG PS- 1 Pain scale- 3 Opioid associated constipation- no  Review of  systems- Review of Systems  Constitutional:  Positive for malaise/fatigue. Negative for chills, fever and weight loss.  HENT:  Negative for congestion, ear discharge and nosebleeds.        Pain during swallowing  Eyes:  Negative for blurred vision.  Respiratory:  Negative for cough, hemoptysis, sputum production, shortness of breath and wheezing.   Cardiovascular:  Negative for chest pain, palpitations, orthopnea and claudication.  Gastrointestinal:  Negative for abdominal pain, blood in stool, constipation, diarrhea, heartburn, melena, nausea and vomiting.  Genitourinary:  Negative for dysuria, flank pain, frequency, hematuria and urgency.  Musculoskeletal:  Negative for back pain, joint pain and myalgias.  Skin:  Negative for rash.  Neurological:  Negative for dizziness, tingling, focal weakness, seizures, weakness and headaches.  Endo/Heme/Allergies:  Does not bruise/bleed easily.  Psychiatric/Behavioral:  Negative for depression and suicidal ideas. The patient does not have insomnia.     Allergies  Allergen Reactions   Levofloxacin     Critical    Past Medical History:  Diagnosis Date   Anxiety    Asthma    Cellulitis and abscess of oral soft tissues    Cellulitis and abscess of oral soft tissues 03/31/2021   pt in hospital and now out as outpt   Depression    Head injury    Multiple myeloma ([place])    Osteoporosis    Past Surgical History:  Procedure Laterality Date   ABDOMINAL HYSTERECTOMY     HEMORROIDECTOMY     SEPTOPLASTY     Social History   Socioeconomic History   Marital status: Widowed    Spouse name: Not on file   Number of children: Not on file  Years of education: Not on file   Highest education level: Not on file  Occupational History   Not on file  Tobacco Use   Smoking status: Never   Smokeless tobacco: Never  Vaping Use   Vaping Use: Never used  Substance and Sexual Activity   Alcohol use: No   Drug use: No   Sexual activity: Not Currently   Other Topics Concern   Not on file  Social History Narrative   Not on file   Social Determinants of Health   Financial Resource Strain: Not on file  Food Insecurity: Not on file  Transportation Needs: Not on file  Physical Activity: Not on file  Stress: Not on file  Social Connections: Not on file  Intimate Partner Violence: Not on file   Family History  Problem Relation Age of Onset   Breast cancer Cousin        mat cousin   Current Outpatient Medications:    albuterol (VENTOLIN HFA) 108 (90 Base) MCG/ACT inhaler, 2 inhalations every 4 (four) hours as needed., Disp: , Rfl:    busPIRone (BUSPAR) 10 MG tablet, Take 10 mg by mouth 2 (two) times daily., Disp: , Rfl:    nortriptyline (PAMELOR) 25 MG capsule, Take 25 mg by mouth at bedtime., Disp: , Rfl:    omeprazole (PRILOSEC) 20 MG capsule, Take 20 mg by mouth daily., Disp: , Rfl:    sertraline (ZOLOFT) 100 MG tablet, Take 100 mg by mouth 2 (two) times daily., Disp: , Rfl:    traZODone (DESYREL) 50 MG tablet, Take 50 mg by mouth at bedtime., Disp: , Rfl:    Vitamin D, Ergocalciferol, (DRISDOL) 50000 UNITS CAPS capsule, Take 50,000 Units by mouth every 30 (thirty) days. Once a month, Disp: , Rfl:    acyclovir (ZOVIRAX) 400 MG tablet, TAKE 1 TABLET BY MOUTH TWICE A DAY (Patient not taking: Reported on 05/02/2021), Disp: 180 tablet, Rfl: 3   alendronate (FOSAMAX) 70 MG tablet, 70 mg once a week. (Patient not taking: Reported on 05/02/2021), Disp: , Rfl:    aspirin 81 MG chewable tablet, Chew by mouth daily. (Patient not taking: Reported on 05/02/2021), Disp: , Rfl:    atorvastatin (LIPITOR) 20 MG tablet, Take 20 mg by mouth daily. (Patient not taking: Reported on 05/02/2021), Disp: , Rfl:    Calcium Carb-Cholecalciferol (CALCIUM 600 + D PO), Take 600 mg by mouth 5 (five) times daily. Increase calcium from $RemoveBefo'600mg'gezbHYxcirP$  4 times a day to $Remo'600mg'aGkew$  5 times a day starting today (Patient not taking: Reported on 05/02/2021), Disp: , Rfl:    docusate  sodium (COLACE) 100 MG capsule, Take 100 mg by mouth daily. (Patient not taking: Reported on 05/02/2021), Disp: , Rfl:    lenalidomide (REVLIMID) 15 MG capsule, Take 1 capsule (15 mg total) by mouth daily. Take 1 tablet daily x 3 weeks and 1 week off    Authorization Number:        Date:, Disp: 21 capsule, Rfl: 0   montelukast (SINGULAIR) 10 MG tablet, Take 10 mg by mouth daily after breakfast. (Patient not taking: Reported on 05/02/2021), Disp: , Rfl:    naproxen (NAPROSYN) 500 MG tablet, Take 500 mg by mouth 2 (two) times daily with a meal. (Patient not taking: Reported on 05/02/2021), Disp: , Rfl:    nystatin (MYCOSTATIN) 100000 UNIT/ML suspension, Take by mouth. (Patient not taking: Reported on 05/02/2021), Disp: , Rfl:   Physical exam:  Vitals:   05/02/21 0811  BP: 124/77  Pulse: 72  Resp: 16  Temp: (!) 97.3 F (36.3 C)  TempSrc: Tympanic  SpO2: 96%  Weight: 177 lb 11.2 oz (80.6 kg)   Physical Exam HENT:     Mouth/Throat:     Mouth: Mucous membranes are moist.     Pharynx: Oropharynx is clear.  Neck:     Comments: No significant neck swelling Cardiovascular:     Rate and Rhythm: Normal rate and regular rhythm.     Heart sounds: Normal heart sounds.  Pulmonary:     Effort: Pulmonary effort is normal.     Breath sounds: Normal breath sounds.  Abdominal:     General: Bowel sounds are normal.     Palpations: Abdomen is soft.  Skin:    General: Skin is warm and dry.  Neurological:     Mental Status: She is alert and oriented to person, place, and time.   CMP Latest Ref Rng & Units 05/02/2021  Glucose 70 - 99 mg/dL 111(H)  BUN 8 - 23 mg/dL 8  Creatinine 0.44 - 1.00 mg/dL 0.88  Sodium 135 - 145 mmol/L 136  Potassium 3.5 - 5.1 mmol/L 3.9  Chloride 98 - 111 mmol/L 104  CO2 22 - 32 mmol/L 25  Calcium 8.9 - 10.3 mg/dL 7.9(L)  Total Protein 6.5 - 8.1 g/dL 5.9(L)  Total Bilirubin 0.3 - 1.2 mg/dL 0.4  Alkaline Phos 38 - 126 U/L 159(H)  AST 15 - 41 U/L 16  ALT 0 - 44 U/L 21    CBC Latest Ref Rng & Units 05/02/2021  WBC 4.0 - 10.5 K/uL 7.5  Hemoglobin 12.0 - 15.0 g/dL 13.2  Hematocrit 36.0 - 46.0 % 39.8  Platelets 150 - 400 K/uL 292   No images are attached to the encounter.  No results found.  Assessment and plan- Patient is a 75 y.o. 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    Proceed with darzalex today.   Bone Mets- continue to hold/permanently discontinue xgeva, all bisphosphonates, given ONJ.   ONJ- s/p antibiotics with ENT/Dr. Tami Ribas. Cleared from follow up. Last seen 12/12. Awaiting extractions with oral surgeon, Dr. Lewanda Rife in January.   Hypocalcemia- corrected calcium 8.1. Monitor.   Abnormal alkaline phosphatase- 159 today. Monitor.   Proceed with daratumumab and b12 today RTC in 4 weeks for labs, Janese Banks, darzalex, b12.   - addendum- patient requests moving appointments to Wednesdays. Will push out next visit x 1 week.    Visit Diagnosis 1. Multiple myeloma not having achieved remission ([place])   2. Encounter for antineoplastic chemotherapy   3. 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

## 2021-05-02 NOTE — Progress Notes (Signed)
Pt recently saw dentist and had a spot in her mouth that he was unsure of what it is and was unsure if related to his cancer. Pt reports difficulty swallowing d/t swelling from abcess. Only taking "small pills" because of swallowing difficulty. Pt asking if she needs to be back on steroid. Pt reports flare up of asthma. Pt c/o diarrhea.

## 2021-05-02 NOTE — Progress Notes (Signed)
Per NP, Beckey Rutter, order: okay to proceed with scheduled Darzalex Faspro treatment and Vitamin B-12 injection today.

## 2021-05-22 ENCOUNTER — Other Ambulatory Visit: Payer: Self-pay | Admitting: *Deleted

## 2021-05-22 ENCOUNTER — Encounter: Payer: Self-pay | Admitting: Hematology and Oncology

## 2021-05-22 NOTE — Telephone Encounter (Signed)
Signing encounter, see previous note 3/24

## 2021-05-27 ENCOUNTER — Encounter: Payer: Self-pay | Admitting: Hematology and Oncology

## 2021-05-28 ENCOUNTER — Other Ambulatory Visit: Payer: Self-pay | Admitting: *Deleted

## 2021-05-28 DIAGNOSIS — C9000 Multiple myeloma not having achieved remission: Secondary | ICD-10-CM

## 2021-05-30 ENCOUNTER — Other Ambulatory Visit: Payer: Medicare HMO

## 2021-05-30 ENCOUNTER — Encounter: Payer: Self-pay | Admitting: Hematology and Oncology

## 2021-05-30 ENCOUNTER — Ambulatory Visit: Payer: Medicare HMO

## 2021-05-30 ENCOUNTER — Ambulatory Visit: Payer: Medicare HMO | Admitting: Oncology

## 2021-06-02 ENCOUNTER — Telehealth: Payer: Self-pay | Admitting: *Deleted

## 2021-06-02 NOTE — Telephone Encounter (Signed)
Patient is having oral surgery on 2/7. She would like to know if she should have her chemotherapy as scheduled.

## 2021-06-02 NOTE — Telephone Encounter (Signed)
Push out by 2 weeks

## 2021-06-03 NOTE — Telephone Encounter (Signed)
Pt having surgery and will restart revlimid 2/20, pt has seven pills left. She also said that her help to get the revlimid needs to be doen again because she got a call 1000 dollars for 21 day supply. I told pt that I will talk to Bethena Roys to get something in place for her. Also Alyson the oral pharmacist will send in next refill close to the date 2/20 which is the day she comes back to cancer center to cont. Treatment. Pt is aware of all above info

## 2021-06-03 NOTE — Telephone Encounter (Signed)
Next treatment pushed out by 2 weeks (Surgery on 2/7) per MD. Patient was agreeable and confirmed new time.

## 2021-06-04 ENCOUNTER — Telehealth: Payer: Self-pay | Admitting: Pharmacy Technician

## 2021-06-04 ENCOUNTER — Inpatient Hospital Stay: Payer: Medicare HMO

## 2021-06-04 ENCOUNTER — Inpatient Hospital Stay: Payer: Medicare HMO | Admitting: Oncology

## 2021-06-04 NOTE — Telephone Encounter (Signed)
Thank you for the info about financial help for revlimid

## 2021-06-04 NOTE — Telephone Encounter (Signed)
Oral Oncology Patient Advocate Encounter  Was successful in securing patient a $12,000 grant from [facility] to provide copayment coverage for Revlimid.  This will keep the out of pocket expense at $0.     [employer] ID: [id number]  I have spoken with the patient.   The billing information is as follows and has been shared with [facility].    RxBin: [id number] PCN: [id number] Member ID: [id number] Group ID: [id number] Dates of Eligibility: 05/05/21 through 05/04/22  Fund:  [place] Patient [place] Phone [phone] Fax [phone] 06/04/2021 2:19 PM

## 2021-06-09 ENCOUNTER — Other Ambulatory Visit: Payer: Self-pay | Admitting: Oncology

## 2021-06-09 ENCOUNTER — Other Ambulatory Visit: Payer: Self-pay | Admitting: *Deleted

## 2021-06-16 ENCOUNTER — Other Ambulatory Visit: Payer: Self-pay | Admitting: Pharmacist

## 2021-06-16 DIAGNOSIS — C9000 Multiple myeloma not having achieved remission: Secondary | ICD-10-CM

## 2021-06-16 MED ORDER — LENALIDOMIDE 15 MG PO CAPS: 15.0000 mg | ORAL_CAPSULE | Freq: Every day | ORAL | 0 refills | Status: DC

## 2021-06-16 NOTE — Telephone Encounter (Signed)
Prescription sent to [facility] today. Rx sent for 14 capsules since patient reported having 7 capsules on hand.

## 2021-06-18 ENCOUNTER — Encounter: Payer: Self-pay | Admitting: Hematology and Oncology

## 2021-06-23 ENCOUNTER — Other Ambulatory Visit: Payer: Self-pay

## 2021-06-23 ENCOUNTER — Inpatient Hospital Stay (HOSPITAL_BASED_OUTPATIENT_CLINIC_OR_DEPARTMENT_OTHER): Payer: Medicare HMO | Admitting: Oncology

## 2021-06-23 ENCOUNTER — Inpatient Hospital Stay: Payer: Medicare HMO

## 2021-06-23 ENCOUNTER — Inpatient Hospital Stay: Payer: Medicare HMO | Attending: Oncology

## 2021-06-23 ENCOUNTER — Encounter: Payer: Self-pay | Admitting: Oncology

## 2021-06-23 VITALS — BP 139/87 | HR 92 | Temp 97.3°F | Resp 17 | Wt 165.0 lb

## 2021-06-23 DIAGNOSIS — C9000 Multiple myeloma not having achieved remission: Secondary | ICD-10-CM | POA: Insufficient documentation

## 2021-06-23 DIAGNOSIS — D649 Anemia, unspecified: Secondary | ICD-10-CM

## 2021-06-23 DIAGNOSIS — Z5112 Encounter for antineoplastic immunotherapy: Secondary | ICD-10-CM | POA: Insufficient documentation

## 2021-06-23 DIAGNOSIS — Z79899 Other long term (current) drug therapy: Secondary | ICD-10-CM

## 2021-06-23 DIAGNOSIS — Z5111 Encounter for antineoplastic chemotherapy: Secondary | ICD-10-CM

## 2021-06-23 DIAGNOSIS — R0781 Pleurodynia: Secondary | ICD-10-CM | POA: Diagnosis not present

## 2021-06-23 LAB — CBC WITH DIFFERENTIAL/PLATELET
Abs Immature Granulocytes: 0.04 10*3/uL (ref 0.00–0.07)
Basophils Absolute: 0.1 10*3/uL (ref 0.0–0.1)
Basophils Relative: 1 %
Eosinophils Absolute: 0.6 10*3/uL — ABNORMAL HIGH (ref 0.0–0.5)
Eosinophils Relative: 8 %
HCT: 42.8 % (ref 36.0–46.0)
Hemoglobin: 13.7 g/dL (ref 12.0–15.0)
Immature Granulocytes: 1 %
Lymphocytes Relative: 14 %
Lymphs Abs: 1 10*3/uL (ref 0.7–4.0)
MCH: 30.9 pg (ref 26.0–34.0)
MCHC: 32 g/dL (ref 30.0–36.0)
MCV: 96.6 fL (ref 80.0–100.0)
Monocytes Absolute: 0.3 10*3/uL (ref 0.1–1.0)
Monocytes Relative: 4 %
Neutro Abs: 5.1 10*3/uL (ref 1.7–7.7)
Neutrophils Relative %: 72 %
Platelets: 238 10*3/uL (ref 150–400)
RBC: 4.43 MIL/uL (ref 3.87–5.11)
RDW: 15.6 % — ABNORMAL HIGH (ref 11.5–15.5)
WBC: 7.1 10*3/uL (ref 4.0–10.5)
nRBC: 0 % (ref 0.0–0.2)

## 2021-06-23 LAB — COMPREHENSIVE METABOLIC PANEL
ALT: 12 U/L (ref 0–44)
AST: 17 U/L (ref 15–41)
Albumin: 4.1 g/dL (ref 3.5–5.0)
Alkaline Phosphatase: 71 U/L (ref 38–126)
Anion gap: 8 (ref 5–15)
BUN: 14 mg/dL (ref 8–23)
CO2: 25 mmol/L (ref 22–32)
Calcium: 9 mg/dL (ref 8.9–10.3)
Chloride: 103 mmol/L (ref 98–111)
Creatinine, Ser: 1.1 mg/dL — ABNORMAL HIGH (ref 0.44–1.00)
GFR, Estimated: 53 mL/min — ABNORMAL LOW (ref >60)
Glucose, Bld: 88 mg/dL (ref 70–99)
Potassium: 4.2 mmol/L (ref 3.5–5.1)
Sodium: 136 mmol/L (ref 135–145)
Total Bilirubin: 0.4 mg/dL (ref 0.3–1.2)
Total Protein: 6.3 g/dL — ABNORMAL LOW (ref 6.5–8.1)

## 2021-06-23 MED ORDER — DARATUMUMAB-HYALURONIDASE-FIHJ 1800-30000 MG-UT/15ML ~~LOC~~ SOLN
1800.0000 mg | Freq: Once | SUBCUTANEOUS | Status: AC
  Administered 2021-06-23: 1800 mg via SUBCUTANEOUS
  Filled 2021-06-23: qty 15

## 2021-06-23 MED ORDER — DEXAMETHASONE 4 MG PO TABS: 20.0000 mg | ORAL_TABLET | Freq: Once | ORAL | Status: DC

## 2021-06-23 MED ORDER — DIPHENHYDRAMINE HCL 25 MG PO CAPS
50.0000 mg | ORAL_CAPSULE | Freq: Once | ORAL | Status: AC
  Administered 2021-06-23: 50 mg via ORAL
  Filled 2021-06-23: qty 2

## 2021-06-23 MED ORDER — ONDANSETRON HCL 4 MG PO TABS
8.0000 mg | ORAL_TABLET | Freq: Once | ORAL | Status: AC
  Administered 2021-06-23: 8 mg via ORAL
  Filled 2021-06-23: qty 2

## 2021-06-23 MED ORDER — ACETAMINOPHEN 325 MG PO TABS
650.0000 mg | ORAL_TABLET | Freq: Once | ORAL | Status: AC
  Administered 2021-06-23: 650 mg via ORAL
  Filled 2021-06-23: qty 2

## 2021-06-23 MED ORDER — CYANOCOBALAMIN 1000 MCG/ML IJ SOLN
1000.0000 ug | Freq: Once | INTRAMUSCULAR | Status: AC
  Administered 2021-06-23: 1000 ug via INTRAMUSCULAR
  Filled 2021-06-23: qty 1

## 2021-06-23 NOTE — Progress Notes (Signed)
Patient here for oncology follow-up appointment, expresses no complaints or concerns at this time.    

## 2021-06-23 NOTE — Patient Instructions (Signed)
[facility] AT [place]  Discharge Instructions: Thank you for choosing [facility] to provide your oncology and hematology care.  If you have a lab appointment with the [facility], please go directly to the [facility] and check in at the registration area.  Wear comfortable clothing and clothing appropriate for easy access to any Portacath or PICC line.   We strive to give you quality time with your provider. You may need to reschedule your appointment if you arrive late (15 or more minutes).  Arriving late affects you and other patients whose appointments are after yours.  Also, if you miss three or more appointments without notifying the office, you may be dismissed from the clinic at the providers discretion.      For prescription refill requests, have your pharmacy contact our office and allow 72 hours for refills to be completed.    Today you received the following chemotherapy and/or immunotherapy agents Darzalex    To help prevent nausea and vomiting after your treatment, we encourage you to take your nausea medication as directed.  BELOW ARE SYMPTOMS THAT SHOULD BE REPORTED IMMEDIATELY: *FEVER GREATER THAN 100.4 F (38 C) OR HIGHER *CHILLS OR SWEATING *NAUSEA AND VOMITING THAT IS NOT CONTROLLED WITH YOUR NAUSEA MEDICATION *UNUSUAL SHORTNESS OF BREATH *UNUSUAL BRUISING OR BLEEDING *URINARY PROBLEMS (pain or burning when urinating, or frequent urination) *BOWEL PROBLEMS (unusual diarrhea, constipation, pain near the anus) TENDERNESS IN MOUTH AND THROAT WITH OR WITHOUT PRESENCE OF ULCERS (sore throat, sores in mouth, or a toothache) UNUSUAL RASH, SWELLING OR PAIN  UNUSUAL VAGINAL DISCHARGE OR ITCHING   Items with * indicate a potential emergency and should be followed up as soon as possible or go to the Emergency Department if any problems should occur.  Please show the CHEMOTHERAPY ALERT CARD or IMMUNOTHERAPY ALERT CARD at check-in to the  Emergency Department and triage nurse.  Should you have questions after your visit or need to cancel or reschedule your appointment, please contact [facility] CANCER [place] AT [place]  [phone] and follow the prompts.  Office hours are 8:00 a.m. to 4:30 p.m. Monday - Friday. Please note that voicemails left after 4:00 p.m. may not be returned until the following business day.  We are closed weekends and major holidays. You have access to a nurse at all times for urgent questions. Please call the main number to the clinic [phone] and follow the prompts.  For any non-urgent questions, you may also contact your provider using MyChart. We now offer e-Visits for anyone 3 and older to request care online for non-urgent symptoms. For details visit [email or web address].   Also download the MyChart app! Go to the app store, search "MyChart", open the app, select [facility], and log in with your MyChart username and password.  Due to Covid, a mask is required upon entering the hospital/clinic. If you do not have a mask, one will be given to you upon arrival. For doctor visits, patients may have 1 support person aged 9 or older with them. For treatment visits, patients cannot have anyone with them due to current Covid guidelines and our immunocompromised population.

## 2021-06-23 NOTE — Progress Notes (Signed)
Hematology/Oncology Consult note [facility]  [phone] [phone]  Patient Care Team: Denton Lank, MD as PCP - General (Family Medicine) Sindy Guadeloupe, MD as Consulting Physician (Hematology and Oncology)   Name of the patient: Laurie [name]  [id number]  December 21, 1945   Date of visit: 06/23/21  Diagnosis- stage III biclonal IgA multiple myeloma  Chief complaint/ Reason for visit-on treatment assessment prior to next cycle of Darzalex  Heme/Onc history: [name] who was diagnosed with stage III biclonal IgA multiple myeloma in June 2021. SPEP was 5.0 gm/dL on 10/03/2019. Random urine revealed 196.6 mg/dL total protein with 58.3% M-spike.  Bone marrow biopsy on 11/13/2019 revealed a hypercellular bone marrow with extensive involvement by plasma cell neoplasm. Atypical plasma cells represented 70% of all cells in the aspirate and was associated with prominent interstitial infiltrates and diffuse sheets in the clot.  Plasma cells were kappa light chain restricted.   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   Bone survey on 10/26/2019 revealed small lytic lesions in the skull. There was no acute fracture.   Patient has been on daratumumab Revlimid dexamethasone regimen since July 2021.  She briefly received Retacrit in the past but has not required it for over 1 year now.   Patient is a Sales promotion account executive Witness    Interval history-patient has now recovered from her dental infections.  She will be in need of partial dentures soon.  Denies any difficulty swallowing.  She did lose about 40 pounds during this process  ECOG PS- 1 Pain scale- 0   Review of  systems- Review of Systems  Constitutional:  Positive for malaise/fatigue and weight loss. Negative for chills and fever.  HENT:  Negative for congestion, ear discharge and nosebleeds.   Eyes:  Negative for blurred vision.  Respiratory:  Negative for cough, hemoptysis, sputum production, shortness of breath and wheezing.   Cardiovascular:  Negative for chest pain, palpitations, orthopnea and claudication.  Gastrointestinal:  Negative for abdominal pain, blood in stool, constipation, diarrhea, heartburn, melena, nausea and vomiting.  Genitourinary:  Negative for dysuria, flank pain, frequency, hematuria and urgency.  Musculoskeletal:  Negative for back pain, joint pain and myalgias.  Skin:  Negative for rash.  Neurological:  Negative for dizziness, tingling, focal weakness, seizures, weakness and headaches.  Endo/Heme/Allergies:  Does not bruise/bleed easily.  Psychiatric/Behavioral:  Negative for depression and suicidal ideas. The patient does not have insomnia.     Allergies  Allergen Reactions   Levofloxacin     Critical    Other Other (See Comments)    Jehovah Witness (blood)  Antihistamines      Past Medical History:  Diagnosis Date   Anxiety    Asthma    Cellulitis and abscess of oral soft tissues    Cellulitis and abscess of oral soft tissues 03/31/2021   pt in hospital and now out as outpt   Depression    Head injury    Multiple myeloma ([place])    Osteoporosis      Past Surgical History:  Procedure Laterality Date   ABDOMINAL HYSTERECTOMY     HEMORROIDECTOMY     SEPTOPLASTY      Social History   Socioeconomic History  Marital status: Widowed    Spouse name: Not on file   Number of children: Not on file   Years of education: Not on file   Highest education level: Not on file  Occupational History   Not on file  Tobacco Use   Smoking status: Never   Smokeless tobacco: Never  Vaping Use   Vaping Use: Never used  Substance and Sexual Activity   Alcohol  use: No   Drug use: No   Sexual activity: Not Currently  Other Topics Concern   Not on file  Social History Narrative   Jehovah Witness    Social Determinants of Health   Financial Resource Strain: Not on file  Food Insecurity: Not on file  Transportation Needs: Not on file  Physical Activity: Not on file  Stress: Not on file  Social Connections: Not on file  Intimate Partner Violence: Not on file    Family History  Problem Relation Age of Onset   Breast cancer Cousin        mat cousin     Current Outpatient Medications:    acyclovir (ZOVIRAX) 400 MG tablet, TAKE 1 TABLET BY MOUTH TWICE A DAY, Disp: 180 tablet, Rfl: 3   albuterol (VENTOLIN HFA) 108 (90 Base) MCG/ACT inhaler, 2 inhalations every 4 (four) hours as needed., Disp: , Rfl:    atorvastatin (LIPITOR) 20 MG tablet, Take 20 mg by mouth daily., Disp: , Rfl:    busPIRone (BUSPAR) 10 MG tablet, Take 10 mg by mouth 2 (two) times daily., Disp: , Rfl:    Calcium Carb-Cholecalciferol (CALCIUM 600 + D PO), Take 600 mg by mouth 5 (five) times daily. Increase calcium from $RemoveBefo'600mg'ygiIBpvWUih$  4 times a day to $Remo'600mg'pbcbd$  5 times a day starting today, Disp: , Rfl:    docusate sodium (COLACE) 100 MG capsule, Take 100 mg by mouth daily., Disp: , Rfl:    lenalidomide (REVLIMID) 15 MG capsule, Take 1 capsule (15 mg total) by mouth daily. Take for 21 days, then hold for 7 days. Repeat every 28 days., Disp: 14 capsule, Rfl: 0   montelukast (SINGULAIR) 10 MG tablet, Take 10 mg by mouth daily after breakfast., Disp: , Rfl:    naproxen (NAPROSYN) 500 MG tablet, Take 500 mg by mouth 2 (two) times daily with a meal., Disp: , Rfl:    nortriptyline (PAMELOR) 25 MG capsule, Take 25 mg by mouth at bedtime., Disp: , Rfl:    omeprazole (PRILOSEC) 20 MG capsule, Take 20 mg by mouth daily., Disp: , Rfl:    sertraline (ZOLOFT) 100 MG tablet, Take 100 mg by mouth 2 (two) times daily., Disp: , Rfl:    traZODone (DESYREL) 50 MG tablet, Take 50 mg by mouth at bedtime., Disp:  , Rfl:    Vitamin D, Ergocalciferol, (DRISDOL) 50000 UNITS CAPS capsule, Take 50,000 Units by mouth every 30 (thirty) days. Once a month, Disp: , Rfl:    alendronate (FOSAMAX) 70 MG tablet, 70 mg once a week. (Patient not taking: Reported on 05/02/2021), Disp: , Rfl:    aspirin 81 MG chewable tablet, Chew by mouth daily. (Patient not taking: Reported on 05/02/2021), Disp: , Rfl:    chlorhexidine (PERIDEX) 0.12 % solution, 15 mLs 2 (two) times daily., Disp: , Rfl:    dexamethasone (DECADRON) 4 MG tablet, Take by mouth., Disp: , Rfl:  No current facility-administered medications for this visit.  Facility-Administered Medications Ordered in Other Visits:    dexamethasone (DECADRON) tablet 20 mg, 20 mg, Oral, Once, Janese Banks,  Weston Anna, MD  Physical exam:  Vitals:   06/23/21 0948  BP: 139/87  Pulse: 92  Resp: 17  Temp: (!) 97.3 F (36.3 C)  TempSrc: Tympanic  SpO2: 99%  Weight: 165 lb (74.8 kg)   Physical Exam Constitutional:      General: She is not in acute distress.    Comments: Ambulates with a walker  Cardiovascular:     Rate and Rhythm: Normal rate and regular rhythm.     Heart sounds: Normal heart sounds.  Pulmonary:     Effort: Pulmonary effort is normal.     Breath sounds: Normal breath sounds.  Abdominal:     General: Bowel sounds are normal.     Palpations: Abdomen is soft.  Skin:    General: Skin is warm and dry.  Neurological:     Mental Status: She is alert and oriented to person, place, and time.     CMP Latest Ref Rng & Units 06/23/2021  Glucose 70 - 99 mg/dL 88  BUN 8 - 23 mg/dL 14  Creatinine 0.44 - 1.00 mg/dL 1.10(H)  Sodium 135 - 145 mmol/L 136  Potassium 3.5 - 5.1 mmol/L 4.2  Chloride 98 - 111 mmol/L 103  CO2 22 - 32 mmol/L 25  Calcium 8.9 - 10.3 mg/dL 9.0  Total Protein 6.5 - 8.1 g/dL 6.3(L)  Total Bilirubin 0.3 - 1.2 mg/dL 0.4  Alkaline Phos 38 - 126 U/L 71  AST 15 - 41 U/L 17  ALT 0 - 44 U/L 12   CBC Latest Ref Rng & Units 06/23/2021  WBC 4.0 -  10.5 K/uL 7.1  Hemoglobin 12.0 - 15.0 g/dL 13.7  Hematocrit 36.0 - 46.0 % 42.8  Platelets 150 - 400 K/uL 238    Assessment and plan- Patient is a 76 y.o. 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  She is here for on treatment assessment prior to next cycle of Darzalex  Counts okay to proceed with cycle 19 of monthly Darzalex today.  She will also resume her Revlimid 15 mg 3 weeks on and 1 week off today.  She is agreeable to restarting acyclovir and aspirin as well.  Myeloma labs from today are pending  We will be discontinuing bisphosphonates permanently given her dental infection and possible osteonecrosis of the jaw.  I will see her back in 4 weeks for cycle 20 of Darzalex.  She also takes weekly Decadron at home   Visit Diagnosis 1. Multiple myeloma not having achieved remission ([place])   2. High risk medication use   3. Encounter for antineoplastic chemotherapy      Dr. Randa Evens, MD, MPH [facility] at [facility] [phone] 06/23/2021 12:39 PM

## 2021-06-24 LAB — KAPPA/LAMBDA LIGHT CHAINS
Kappa free light chain: 4.7 mg/L (ref 3.3–19.4)
Kappa, lambda light chain ratio: 1.57 (ref 0.26–1.65)
Lambda free light chains: 3 mg/L — ABNORMAL LOW (ref 5.7–26.3)

## 2021-06-25 ENCOUNTER — Telehealth: Payer: Self-pay | Admitting: *Deleted

## 2021-06-25 ENCOUNTER — Other Ambulatory Visit: Payer: Self-pay

## 2021-06-25 ENCOUNTER — Inpatient Hospital Stay (HOSPITAL_BASED_OUTPATIENT_CLINIC_OR_DEPARTMENT_OTHER): Payer: Medicare HMO | Admitting: Hospice and Palliative Medicine

## 2021-06-25 DIAGNOSIS — C9000 Multiple myeloma not having achieved remission: Secondary | ICD-10-CM

## 2021-06-25 DIAGNOSIS — R0781 Pleurodynia: Secondary | ICD-10-CM

## 2021-06-25 LAB — MULTIPLE MYELOMA PANEL, SERUM
Albumin SerPl Elph-Mcnc: 3.4 g/dL (ref 2.9–4.4)
Albumin/Glob SerPl: 1.5 (ref 0.7–1.7)
Alpha 1: 0.3 g/dL (ref 0.0–0.4)
Alpha2 Glob SerPl Elph-Mcnc: 0.8 g/dL (ref 0.4–1.0)
B-Globulin SerPl Elph-Mcnc: 0.8 g/dL (ref 0.7–1.3)
Gamma Glob SerPl Elph-Mcnc: 0.4 g/dL (ref 0.4–1.8)
Globulin, Total: 2.3 g/dL (ref 2.2–3.9)
IgA: 32 mg/dL — ABNORMAL LOW (ref 64–422)
IgG (Immunoglobin G), Serum: 272 mg/dL — ABNORMAL LOW (ref 586–1602)
IgM (Immunoglobulin M), Srm: 26 mg/dL (ref 26–217)
Total Protein ELP: 5.7 g/dL — ABNORMAL LOW (ref 6.0–8.5)

## 2021-06-25 NOTE — Telephone Encounter (Signed)
Can you please do in person or video visit with her?

## 2021-06-25 NOTE — Telephone Encounter (Signed)
Patient called reporting that she fell on her back yesterday striking the back of her head. Today her ribs are hurting and she is asking if you think she needs an xray. Please advise

## 2021-06-25 NOTE — Telephone Encounter (Signed)
Pt contacted. Due to not having a ride, pt would prefer a virtual visit. Appointment scheduled.

## 2021-06-25 NOTE — Progress Notes (Signed)
Virtual Visit via Telephone Note  I connected with Laurie [name] on 06/25/21 at  2:00 PM EST by telephone and verified that I am speaking with the correct person using two identifiers.  Location: Patient: Home Provider: Clinic   I discussed the limitations, risks, security and privacy concerns of performing an evaluation and management service by telephone and the availability of in person appointments. I also discussed with the patient that there may be a patient responsible charge related to this service. The patient expressed understanding and agreed to proceed.   History of Present Illness: Laurie [name] is a 76 year old woman with multiple medical problems including stage III multiple myeloma on treatment with Darzalex.  Patient requested virtual [facility] visit today to discuss rib pain after a fall.   Observations/Objective: Patient reports that she slipped on her kitchen floor due to poor treading on her shoes.  This occurred earlier today.  She fell back and landed flat on her chest.  She has subsequently had bilateral rib pain but says that it is eased off throughout the day.  She denies painful inspiration or breathing difficulties.  She denies seeing bruising or signs of trauma.  She denies neurological changes, headaches, dizziness or confusion.  She denies any previous falls, balance, or gait issues.  She says that she has not required anything for pain and is actually feeling better as the day has progressed.  Patient is requesting x-ray to rule out fractures.  Assessment and Plan: Rib pain -we will send patient for rib series/x-ray.  In the absence of fracture, will plan conservative treatment.  Patient denied requiring any pain medication.  She is not having breathing difficulties.  I suggested that we could have our occupational therapist evaluate gait/balance but she declined this.  Follow Up Instructions: RTC as previously scheduled to see Dr. Janese Banks in 1 month.  We may see  her sooner in [facility] if needed.   I discussed the assessment and treatment plan with the patient. The patient was provided an opportunity to ask questions and all were answered. The patient agreed with the plan and demonstrated an understanding of the instructions.   The patient was advised to call back or seek an in-person evaluation if the symptoms worsen or if the condition fails to improve as anticipated.  I provided 10 minutes of non-face-to-face time during this encounter.   Irean Hong, NP

## 2021-06-26 ENCOUNTER — Ambulatory Visit
Admission: RE | Admit: 2021-06-26 | Discharge: 2021-06-26 | Disposition: A | Discharge status: Home / Self Care | Payer: Medicare HMO | Source: Ambulatory Visit | Attending: Hospice and Palliative Medicine | Admitting: Hospice and Palliative Medicine

## 2021-06-26 ENCOUNTER — Ambulatory Visit
Admission: RE | Admit: 2021-06-26 | Discharge: 2021-06-26 | Disposition: A | Discharge status: Home / Self Care | Payer: Medicare HMO | Attending: Hospice and Palliative Medicine | Admitting: Hospice and Palliative Medicine

## 2021-06-26 DIAGNOSIS — C9000 Multiple myeloma not having achieved remission: Secondary | ICD-10-CM

## 2021-07-06 ENCOUNTER — Other Ambulatory Visit: Payer: Self-pay

## 2021-07-06 ENCOUNTER — Ambulatory Visit
Admission: EM | Admit: 2021-07-06 | Discharge: 2021-07-06 | Disposition: A | Discharge status: Home / Self Care | Payer: Medicare HMO | Attending: Internal Medicine | Admitting: Internal Medicine

## 2021-07-06 DIAGNOSIS — K122 Cellulitis and abscess of mouth: Secondary | ICD-10-CM | POA: Diagnosis not present

## 2021-07-06 MED ORDER — CEPHALEXIN 500 MG PO CAPS: 1000.0000 mg | ORAL_CAPSULE | Freq: Four times a day (QID) | ORAL | 0 refills | Status: DC

## 2021-07-06 NOTE — ED Provider Notes (Signed)
?[facility] ? ? ? ?CSN: [id number] ?Arrival date & time: 07/06/21  0809 ? ? ?  ? ?History   ?Chief Complaint ?Chief Complaint  ?Patient presents with  ? Mouth pain  ? ? ?HPI ?Laurie [name] is a 76 y.o. female who presents with L face and chin swelling x 3 days. Had dental procedure 2/21 where she had injections on upper and lower gums to remove some teeth and pieces of tooth. She thought the swelling was related to the injections and was hoping it would resolve. Her swelling was worse yesterday compared to today. She denies having fevers. Has not been able to wear there partials due to so much pain. She did not call her dentist when this problem started.  ? ? ? ?Past Medical History:  ?Diagnosis Date  ? Anxiety   ? Asthma   ? Cellulitis and abscess of oral soft tissues   ? Cellulitis and abscess of oral soft tissues 03/31/2021  ? pt in hospital and now out as outpt  ? Depression   ? Head injury   ? Multiple myeloma ([place])   ? Osteoporosis   ? ? ?Patient Active Problem List  ? Diagnosis Date Noted  ? Abscess of tongue 03/31/2021  ? Maintenance chemotherapy 03/31/2021  ? Depression 03/31/2021  ? Hypocalcemia 03/13/2020  ? Chemotherapy-induced neutropenia ([place]) 01/28/2020  ? Encounter for antineoplastic immunotherapy 12/03/2019  ? Multiple myeloma not having achieved remission ([place]) 11/23/2019  ? Anemia 11/23/2019  ? Lytic bone lesions on xray 11/13/2019  ? Goals of care, counseling/discussion 11/13/2019  ? Monoclonal gammopathy 10/26/2019  ? B12 deficiency 10/26/2019  ? Macrocytic anemia 10/26/2019  ? Aphasia 07/18/2015  ? Neck pain 07/18/2015  ? Unsteady gait 07/18/2015  ? Acute post-traumatic headache, not intractable 03/14/2015  ? Asthma, intermittent, uncomplicated 65/53/7482  ? ? ?Past Surgical History:  ?Procedure Laterality Date  ? ABDOMINAL HYSTERECTOMY    ? HEMORROIDECTOMY    ? MOUTH SURGERY    ? SEPTOPLASTY    ? ? ?OB History   ?No obstetric history on file. ?  ? ? ? ?Home Medications   ? ?Prior  to Admission medications   ?Medication Sig Start Date End Date Taking? Authorizing Provider  ?acyclovir (ZOVIRAX) 400 MG tablet TAKE 1 TABLET BY MOUTH TWICE A DAY 08/20/20  Yes Corcoran, Melissa C, MD  ?albuterol (VENTOLIN HFA) 108 (90 Base) MCG/ACT inhaler 2 inhalations every 4 (four) hours as needed.   Yes [provider]  ?aspirin 81 MG chewable tablet Chew by mouth daily.   Yes [provider]  ?atorvastatin (LIPITOR) 20 MG tablet Take 20 mg by mouth daily.   Yes [provider]  ?busPIRone (BUSPAR) 10 MG tablet Take 10 mg by mouth 2 (two) times daily.   Yes [provider]  ?Calcium Carb-Cholecalciferol (CALCIUM 600 + D PO) Take 600 mg by mouth 5 (five) times daily. Increase calcium from $RemoveBefo'600mg'usVSYfICxag$  4 times a day to $Remo'600mg'HlZgd$  5 times a day starting today 06/13/20  Yes [provider]  ?chlorhexidine (PERIDEX) 0.12 % solution 15 mLs 2 (two) times daily. 06/10/21  Yes [provider]  ?dexamethasone (DECADRON) 4 MG tablet Take by mouth. 05/02/21  Yes [provider]  ?docusate sodium (COLACE) 100 MG capsule Take 100 mg by mouth daily.   Yes [provider]  ?lenalidomide (REVLIMID) 15 MG capsule Take 1 capsule (15 mg total) by mouth daily. Take for 21 days, then hold for 7 days. Repeat every 28 days.  06/16/21  Yes Sindy Guadeloupe, MD  ?montelukast (SINGULAIR) 10 MG tablet Take 10 mg by mouth daily after breakfast.   Yes [provider]  ?naproxen (NAPROSYN) 500 MG tablet Take 500 mg by mouth 2 (two) times daily with a meal.   Yes [provider]  ?nortriptyline (PAMELOR) 25 MG capsule Take 25 mg by mouth at bedtime. 03/17/21  Yes [provider]  ?omeprazole (PRILOSEC) 20 MG capsule Take 20 mg by mouth daily.   Yes [provider]  ?sertraline (ZOLOFT) 100 MG tablet Take 100 mg by mouth 2 (two) times daily.   Yes [provider]  ?traZODone (DESYREL) 50 MG tablet Take 50 mg by mouth at bedtime. 03/24/21  Yes  [provider]  ?Vitamin D, Ergocalciferol, (DRISDOL) 50000 UNITS CAPS capsule Take 50,000 Units by mouth every 30 (thirty) days. Once a month   Yes [provider]  ?alendronate (FOSAMAX) 70 MG tablet 70 mg once a week. ?Patient not taking: Reported on 05/02/2021 11/21/15   [provider]  ? ? ?Family History ?Family History  ?Problem Relation Age of Onset  ? Breast cancer Cousin   ?     mat cousin  ? ? ?Social History ?Social History  ? ?Tobacco Use  ? Smoking status: Never  ? Smokeless tobacco: Never  ?Vaping Use  ? Vaping Use: Never used  ?Substance Use Topics  ? Alcohol use: No  ? Drug use: No  ? ? ? ?Allergies   ?Levofloxacin and Other ? ? ?Review of Systems ?Review of Systems  ?Constitutional:  Negative for chills, diaphoresis and fever.  ?HENT:  Positive for dental problem and facial swelling. Negative for mouth sores, sore throat and trouble swallowing.   ?Skin:  Negative for rash.  ?Hematological:  Positive for adenopathy.  ? ? ?Physical Exam ?Triage Vital Signs ?ED Triage Vitals  ?Enc Vitals Group  ?   BP 07/06/21 0822 120/76  ?   Pulse Rate 07/06/21 0822 (!) 115  ?   Resp 07/06/21 0822 20  ?   Temp 07/06/21 0822 98 ?F (36.7 ?C)  ?   Temp Source 07/06/21 0822 Oral  ?   SpO2 07/06/21 0822 99 %  ?   Weight 07/06/21 0817 167 lb (75.8 kg)  ?   Height 07/06/21 0817 $RemoveBefor'5\' 5"'ELSNdiXYOQDx$  (1.651 m)  ?   Head Circumference --   ?   Peak Flow --   ?   Pain Score 07/06/21 0817 0  ?   Pain Loc --   ?   Pain Edu? --   ?   Excl. in [place]? --   ? ?No data found. ? ?Updated Vital Signs ?BP 120/76 (BP Location: Left Arm)   Pulse (!) 115   Temp 98 ?F (36.7 ?C) (Oral)   Resp 20   Ht $R'5\' 5"'jv$  (1.651 m)   Wt 167 lb (75.8 kg)   SpO2 99%   BMI 27.79 kg/m?  ? ?Visual Acuity ?Right Eye Distance:   ?Left Eye Distance:   ?Bilateral Distance:   ? ?Right Eye Near:   ?Left Eye Near:    ?Bilateral Near:    ? ?Physical Exam ?Vitals and nursing note reviewed.  ?Constitutional:   ?   General: She is not in acute distress. ?    Appearance: She is not toxic-appearing.  ?HENT:  ?   Head:  ?   Comments: Has swelling on her chin to the L and L lower face with mild erythema of  the skin. No skin wounds noted.  ?   Right Ear: External ear normal.  ?   Left Ear: External ear normal.  ?   Mouth/Throat:  ?   Mouth: Mucous membranes are moist.  ?   Dentition: Gingival swelling present.  ?   Pharynx: Oropharynx is clear.  ?   Comments: On L mid gum region where the was an extraction. I dont see an abscess. Palpation of oral mucosa does not show any masses or abscesses  ?Eyes:  ?   General: No scleral icterus. ?   Conjunctiva/sclera: Conjunctivae normal.  ?Pulmonary:  ?   Effort: Pulmonary effort is normal.  ?Musculoskeletal:     ?   General: Normal range of motion.  ?   Cervical back: Neck supple.  ?Lymphadenopathy:  ?   Cervical: Cervical adenopathy present.  ?Skin: ?   General: Skin is warm and dry.  ?   Findings: No rash.  ?Neurological:  ?   Mental Status: She is alert and oriented to person, place, and time.  ?   Gait: Gait normal.  ?Psychiatric:     ?   Mood and Affect: Mood normal.     ?   Behavior: Behavior normal.     ?   Thought Content: Thought content normal.  ? ? ? ?UC Treatments / Results  ?Labs ?(all labs ordered are listed, but only abnormal results are displayed) ?Labs Reviewed - No data to display ? ?EKG ? ? ?Radiology ?No results found. ? ?Procedures ?Procedures (including critical care time) ? ?Medications Ordered in UC ?Medications - No data to display ? ?Initial Impression / Assessment and Plan / UC Course  ?I have reviewed the triage vital signs and the nursing notes. ?Oral infection ?I placed her on Keflex as noted. Needs to FU with her dentist next week.  ? ? ? ?Final Clinical Impressions(s) / UC Diagnoses  ? ?Final diagnoses:  ?None  ? ?Discharge Instructions   ?None ?  ? ?ED Prescriptions   ?None ?  ? ?PDMP not reviewed this encounter. ?  Shelby Mattocks, PA-C ?07/06/21 2956 ? ?

## 2021-07-06 NOTE — ED Triage Notes (Signed)
Patient c/o having a dental procedure done -- 06/24/2021. ?3 days ago she started having pain, swelling and pressure.  ?

## 2021-07-06 NOTE — Discharge Instructions (Signed)
Please call your dentist tomorrow for follow  ?

## 2021-07-07 ENCOUNTER — Telehealth: Payer: Self-pay | Admitting: *Deleted

## 2021-07-07 NOTE — Telephone Encounter (Signed)
Patient called wanting Dr Janese Banks to know that she had to go to Urgent care yesterday for facial swelling and was started on Cephalexin 1000 mg for an infection.  ?Her next appointment with Dr Janese Banks is 07/21/21 ?

## 2021-07-16 ENCOUNTER — Other Ambulatory Visit: Payer: Self-pay | Admitting: *Deleted

## 2021-07-16 ENCOUNTER — Other Ambulatory Visit: Payer: Self-pay

## 2021-07-16 DIAGNOSIS — C9000 Multiple myeloma not having achieved remission: Secondary | ICD-10-CM

## 2021-07-16 MED ORDER — LENALIDOMIDE 15 MG PO CAPS: 15.0000 mg | ORAL_CAPSULE | Freq: Every day | ORAL | 0 refills | Status: DC

## 2021-07-17 ENCOUNTER — Encounter: Payer: Self-pay | Admitting: Hematology and Oncology

## 2021-07-21 ENCOUNTER — Inpatient Hospital Stay (HOSPITAL_BASED_OUTPATIENT_CLINIC_OR_DEPARTMENT_OTHER): Payer: Medicare HMO | Admitting: Oncology

## 2021-07-21 ENCOUNTER — Encounter: Payer: Self-pay | Admitting: Oncology

## 2021-07-21 ENCOUNTER — Other Ambulatory Visit: Payer: Self-pay

## 2021-07-21 ENCOUNTER — Inpatient Hospital Stay: Payer: Medicare HMO | Attending: Oncology

## 2021-07-21 ENCOUNTER — Telehealth: Payer: Self-pay | Admitting: *Deleted

## 2021-07-21 ENCOUNTER — Inpatient Hospital Stay: Payer: Medicare HMO

## 2021-07-21 VITALS — BP 148/87 | HR 101 | Temp 96.7°F | Resp 16 | Ht 65.0 in | Wt 168.3 lb

## 2021-07-21 VITALS — HR 99

## 2021-07-21 DIAGNOSIS — E538 Deficiency of other specified B group vitamins: Secondary | ICD-10-CM | POA: Insufficient documentation

## 2021-07-21 DIAGNOSIS — C9000 Multiple myeloma not having achieved remission: Secondary | ICD-10-CM

## 2021-07-21 DIAGNOSIS — Z5112 Encounter for antineoplastic immunotherapy: Secondary | ICD-10-CM | POA: Diagnosis present

## 2021-07-21 DIAGNOSIS — Z79899 Other long term (current) drug therapy: Secondary | ICD-10-CM | POA: Insufficient documentation

## 2021-07-21 DIAGNOSIS — Z5111 Encounter for antineoplastic chemotherapy: Secondary | ICD-10-CM

## 2021-07-21 DIAGNOSIS — D649 Anemia, unspecified: Secondary | ICD-10-CM

## 2021-07-21 DIAGNOSIS — C9001 Multiple myeloma in remission: Secondary | ICD-10-CM

## 2021-07-21 LAB — CBC WITH DIFFERENTIAL/PLATELET
Abs Immature Granulocytes: 0.06 10*3/uL (ref 0.00–0.07)
Basophils Absolute: 0.1 10*3/uL (ref 0.0–0.1)
Basophils Relative: 2 %
Eosinophils Absolute: 0.3 10*3/uL (ref 0.0–0.5)
Eosinophils Relative: 5 %
HCT: 40.9 % (ref 36.0–46.0)
Hemoglobin: 13.4 g/dL (ref 12.0–15.0)
Immature Granulocytes: 1 %
Lymphocytes Relative: 11 %
Lymphs Abs: 0.6 10*3/uL — ABNORMAL LOW (ref 0.7–4.0)
MCH: 30.8 pg (ref 26.0–34.0)
MCHC: 32.8 g/dL (ref 30.0–36.0)
MCV: 94 fL (ref 80.0–100.0)
Monocytes Absolute: 0.5 10*3/uL (ref 0.1–1.0)
Monocytes Relative: 8 %
Neutro Abs: 4.2 10*3/uL (ref 1.7–7.7)
Neutrophils Relative %: 73 %
Platelets: 282 10*3/uL (ref 150–400)
RBC: 4.35 MIL/uL (ref 3.87–5.11)
RDW: 15.1 % (ref 11.5–15.5)
WBC: 5.8 10*3/uL (ref 4.0–10.5)
nRBC: 0 % (ref 0.0–0.2)

## 2021-07-21 LAB — COMPREHENSIVE METABOLIC PANEL
ALT: 16 U/L (ref 0–44)
AST: 15 U/L (ref 15–41)
Albumin: 3.9 g/dL (ref 3.5–5.0)
Alkaline Phosphatase: 98 U/L (ref 38–126)
Anion gap: 7 (ref 5–15)
BUN: 26 mg/dL — ABNORMAL HIGH (ref 8–23)
CO2: 26 mmol/L (ref 22–32)
Calcium: 9.2 mg/dL (ref 8.9–10.3)
Chloride: 106 mmol/L (ref 98–111)
Creatinine, Ser: 0.96 mg/dL (ref 0.44–1.00)
GFR, Estimated: >60 mL/min (ref >60)
Glucose, Bld: 108 mg/dL — ABNORMAL HIGH (ref 70–99)
Potassium: 4.5 mmol/L (ref 3.5–5.1)
Sodium: 139 mmol/L (ref 135–145)
Total Bilirubin: 0.5 mg/dL (ref 0.3–1.2)
Total Protein: 6.1 g/dL — ABNORMAL LOW (ref 6.5–8.1)

## 2021-07-21 MED ORDER — DIPHENHYDRAMINE HCL 25 MG PO CAPS
50.0000 mg | ORAL_CAPSULE | Freq: Once | ORAL | Status: AC
  Administered 2021-07-21: 50 mg via ORAL
  Filled 2021-07-21: qty 2

## 2021-07-21 MED ORDER — ONDANSETRON HCL 4 MG PO TABS
8.0000 mg | ORAL_TABLET | Freq: Once | ORAL | Status: AC
  Administered 2021-07-21: 8 mg via ORAL
  Filled 2021-07-21: qty 2

## 2021-07-21 MED ORDER — ACETAMINOPHEN 325 MG PO TABS
650.0000 mg | ORAL_TABLET | Freq: Once | ORAL | Status: AC
  Administered 2021-07-21: 650 mg via ORAL
  Filled 2021-07-21: qty 2

## 2021-07-21 MED ORDER — DARATUMUMAB-HYALURONIDASE-FIHJ 1800-30000 MG-UT/15ML ~~LOC~~ SOLN
1800.0000 mg | Freq: Once | SUBCUTANEOUS | Status: AC
  Administered 2021-07-21: 1800 mg via SUBCUTANEOUS
  Filled 2021-07-21: qty 15

## 2021-07-21 MED ORDER — DEXAMETHASONE 4 MG PO TABS: 20.0000 mg | ORAL_TABLET | Freq: Once | ORAL | Status: DC

## 2021-07-21 MED ORDER — CYANOCOBALAMIN 1000 MCG/ML IJ SOLN
1000.0000 ug | Freq: Once | INTRAMUSCULAR | Status: AC
  Administered 2021-07-21: 1000 ug via INTRAMUSCULAR
  Filled 2021-07-21: qty 1

## 2021-07-21 MED ORDER — SODIUM CHLORIDE 0.9 % IV SOLN
Freq: Once | INTRAVENOUS | Status: DC
  Filled 2021-07-21: qty 250

## 2021-07-21 NOTE — Progress Notes (Signed)
Pt states took decadron po at home. Pt completed 15 min observation post darzalex ?

## 2021-07-21 NOTE — Telephone Encounter (Signed)
Patient asking if her MM is in remission or not. Per AVS:  ?You saw Sindy Guadeloupe, MD on ?Monday July 21, 2021. The ?following issues were ?addressed: Encounter for ?antineoplastic chemotherapy, High risk ?medication use, and Multiple myeloma in ?remission ([place]). ?Office note states otherwise: ?Visit Diagnosis ?1. Encounter for antineoplastic chemotherapy   ?2. High risk medication use   ?3. Multiple myeloma not having achieved remission ([place])   ?  ?  ?  ?Dr. Randa Evens, MD, MPH ?[facility] ?[phone] ?07/21/2021 ?9:04 AM ?

## 2021-07-21 NOTE — Patient Instructions (Signed)
[facility] [place]  Discharge Instructions: ?Thank you for choosing [facility] to provide your oncology and hematology care.  ?If you have a lab appointment with the [facility], please go directly to the [facility] and check in at the registration area. ? ?Wear comfortable clothing and clothing appropriate for easy access to any Portacath or PICC line.  ? ?We strive to give you quality time with your provider. You may need to reschedule your appointment if you arrive late (15 or more minutes).  Arriving late affects you and other patients whose appointments are after yours.  Also, if you miss three or more appointments without notifying the office, you may be dismissed from the clinic at the provider?s discretion.    ?  ?For prescription refill requests, have your pharmacy contact our office and allow 72 hours for refills to be completed.   ? ?Today you received the following chemotherapy and/or immunotherapy agents DARZALEX    ?  ?To help prevent nausea and vomiting after your treatment, we encourage you to take your nausea medication as directed. ? ?BELOW ARE SYMPTOMS THAT SHOULD BE REPORTED IMMEDIATELY: ?*FEVER GREATER THAN 100.4 F (38 ?C) OR HIGHER ?*CHILLS OR SWEATING ?*NAUSEA AND VOMITING THAT IS NOT CONTROLLED WITH YOUR NAUSEA MEDICATION ?*UNUSUAL SHORTNESS OF BREATH ?*UNUSUAL BRUISING OR BLEEDING ?*URINARY PROBLEMS (pain or burning when urinating, or frequent urination) ?*BOWEL PROBLEMS (unusual diarrhea, constipation, pain near the anus) ?TENDERNESS IN MOUTH AND THROAT WITH OR WITHOUT PRESENCE OF ULCERS (sore throat, sores in mouth, or a toothache) ?UNUSUAL RASH, SWELLING OR PAIN  ?UNUSUAL VAGINAL DISCHARGE OR ITCHING  ? ?Items with * indicate a potential emergency and should be followed up as soon as possible or go to the Emergency Department if any problems should occur. ? ?Please show the CHEMOTHERAPY ALERT CARD or IMMUNOTHERAPY ALERT CARD at check-in to  the Emergency Department and triage nurse. ? ?Should you have questions after your visit or need to cancel or reschedule your appointment, please contact [facility] [place] AT [place]  [phone] and follow the prompts.  Office hours are 8:00 a.m. to 4:30 p.m. Monday - Friday. Please note that voicemails left after 4:00 p.m. may not be returned until the following business day.  We are closed weekends and major holidays. You have access to a nurse at all times for urgent questions. Please call the main number to the clinic [phone] and follow the prompts. ? ?For any non-urgent questions, you may also contact your provider using MyChart. We now offer e-Visits for anyone 8 and older to request care online for non-urgent symptoms. For details visit [email or web address]. ?  ?Also download the MyChart app! Go to the app store, search "MyChart", open the app, select [facility], and log in with your MyChart username and password. ? ?Due to Covid, a mask is required upon entering the hospital/clinic. If you do not have a mask, one will be given to you upon arrival. For doctor visits, patients may have 1 support person aged 82 or older with them. For treatment visits, patients cannot have anyone with them due to current Covid guidelines and our immunocompromised population.  ? ?Daratumumab injection ?What is this medication? ?DARATUMUMAB (dar a toom ue mab) is a monoclonal antibody. It is used to treat multiple myeloma. ?This medicine may be used for other purposes; ask your health care provider or pharmacist if you have questions. ?COMMON BRAND NAME(S): DARZALEX ?What should I tell my care team before I  take this medication? ?They need to know if you have any of these conditions: ?hereditary fructose intolerance ?infection (especially a virus infection such as chickenpox, herpes, or hepatitis B virus) ?lung or breathing disease (asthma, COPD) ?an unusual or allergic reaction to daratumumab,  sorbitol, other medicines, foods, dyes, or preservatives ?pregnant or trying to get pregnant ?breast-feeding ?How should I use this medication? ?This medicine is for infusion into a vein. It is given by a health care professional in a hospital or clinic setting. ?Talk to your pediatrician regarding the use of this medicine in children. Special care may be needed. ?Overdosage: If you think you have taken too much of this medicine contact a poison control center or emergency room at once. ?NOTE: This medicine is only for you. Do not share this medicine with others. ?What if I miss a dose? ?Keep appointments for follow-up doses as directed. It is important not to miss your dose. Call your doctor or health care professional if you are unable to keep an appointment. ?What may interact with this medication? ?Interactions have not been studied. ?This list may not describe all possible interactions. Give your health care provider a list of all the medicines, herbs, non-prescription drugs, or dietary supplements you use. Also tell them if you smoke, drink alcohol, or use illegal drugs. Some items may interact with your medicine. ?What should I watch for while using this medication? ?Your condition will be monitored carefully while you are receiving this medicine. ?This medicine can cause serious allergic reactions. To reduce your risk, your health care provider may give you other medicine to take before receiving this one. Be sure to follow the directions from your health care provider. ?This medicine can affect the results of blood tests to match your blood type. These changes can last for up to 6 months after the final dose. Your healthcare provider will do blood tests to match your blood type before you start treatment. Tell all of your healthcare providers that you are being treated with this medicine before receiving a blood transfusion. ?This medicine can affect the results of some tests used to determine treatment  response; extra tests may be needed to evaluate response. ?Do not become pregnant while taking this medicine or for 3 months after stopping it. Women should inform their health care provider if they wish to become pregnant or think they might be pregnant. There is a potential for serious side effects to an unborn child. Talk to your health care provider for more information. Do not breast-feed an infant while taking this medicine. ?What side effects may I notice from receiving this medication? ?Side effects that you should report to your care team as soon as possible: ?Allergic reactions--skin rash, itching or hives, swelling of the face, lips, or tongue ?Blurred vision ?Infection--fever, chills, cough, sore throat, pain or trouble passing urine ?Infusion reactions--dizziness, fast heartbeat, feeling faint or lightheaded, falls, headache, increase in blood pressure, nausea, vomiting, or wheezing or trouble breathing with loud or whistling sounds ?Unusual bleeding or bruising ?Side effects that usually do not require medical attention (report to your care team if they continue or are bothersome): ?Constipation ?Diarrhea ?Pain, tingling, numbness in the hands or feet ?Swelling of the ankles, feet, hands ?Tiredness ?This list may not describe all possible side effects. Call your doctor for medical advice about side effects. You may report side effects to FDA at 1-800-FDA-1088. ?Where should I keep my medication? ?This drug is given in a hospital or clinic and will not  be stored at home. ?NOTE: This sheet is a summary. It may not cover all possible information. If you have questions about this medicine, talk to your doctor, pharmacist, or health care provider. ?? 2022 Elsevier/Gold Standard (2020-09-26 00:00:00) ? ?

## 2021-07-21 NOTE — Telephone Encounter (Signed)
Patient called back and said she made a mistake and she is not in remission and there is no need to call her back ?

## 2021-07-21 NOTE — Progress Notes (Signed)
? ? ? ?Hematology/Oncology Consult note ?[place]  ?Telephone:([phone]) [phone] [phone] ? ?Patient Care Team: ?Denton Lank, MD as PCP - General (Family Medicine) ?Sindy Guadeloupe, MD as Consulting Physician (Hematology and Oncology)  ? ?Name of the patient: Laurie [name]  ?[id number]  ?1945-11-23  ? ?Date of visit: 07/21/21 ? ?Diagnosis- stage III biclonal IgA multiple myeloma ? ?Chief complaint/ Reason for visit-on treatment assessment prior to next cycle of Darzalex ? ?Heme/Onc history: [name] who was diagnosed with stage III biclonal IgA multiple myeloma in June 2021. SPEP was 5.0 gm/dL on 10/03/2019. Random urine revealed 196.6 mg/dL total protein with 58.3% M-spike.  Bone marrow biopsy on 11/13/2019 revealed a hypercellular bone marrow with extensive involvement by plasma cell neoplasm. Atypical plasma cells represented 70% of all cells in the aspirate and was associated with prominent interstitial infiltrates and diffuse sheets in the clot.  Plasma cells were kappa light chain restricted.   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   Bone survey on 10/26/2019 revealed small lytic lesions in the skull. There was no acute fracture. ?  ?Patient has been on daratumumab Revlimid dexamethasone regimen since July 2021.  She briefly received Retacrit in the past but has not required it for over 1 year now. ?  ?Patient is a Sales promotion account executive Witness ?  ?  ? ?Interval history-patient is still having issues with jawbone healing/osteonecrosis and is awaiting appointment with oral surgery at [facility].  She was in the ER about 2 weeks ago for symptoms of right cheek swelling for which she was given a course of  Keflex. ? ?ECOG PS- 1 ?Pain scale- 0 ? ? ?Review of systems- Review of Systems  ?Constitutional:  Positive for malaise/fatigue. Negative for chills, fever and weight loss.  ?HENT:  Negative for congestion, ear discharge and nosebleeds.   ?Eyes:  Negative for blurred vision.  ?Respiratory:  Negative for cough, hemoptysis, sputum production, shortness of breath and wheezing.   ?Cardiovascular:  Negative for chest pain, palpitations, orthopnea and claudication.  ?Gastrointestinal:  Negative for abdominal pain, blood in stool, constipation, diarrhea, heartburn, melena, nausea and vomiting.  ?Genitourinary:  Negative for dysuria, flank pain, frequency, hematuria and urgency.  ?Musculoskeletal:  Negative for back pain, joint pain and myalgias.  ?Skin:  Negative for rash.  ?Neurological:  Negative for dizziness, tingling, focal weakness, seizures, weakness and headaches.  ?Endo/Heme/Allergies:  Does not bruise/bleed easily.  ?Psychiatric/Behavioral:  Negative for depression and suicidal ideas. The patient does not have insomnia.    ? ? ?Allergies  ?Allergen Reactions  ? Levofloxacin   ?  Critical ?  ? Other Other (See Comments)  ?  Jehovah Witness (blood) ? ?Antihistamines   ? ? ? ?Past Medical History:  ?Diagnosis Date  ? Anxiety   ? Asthma   ? Cellulitis and abscess of oral soft tissues   ? Cellulitis and abscess of oral soft tissues 03/31/2021  ? pt in hospital and now out as outpt  ? Depression   ? Head injury   ? Multiple myeloma ([place])   ? Osteoporosis   ? ? ? ?Past Surgical History:  ?Procedure Laterality Date  ? ABDOMINAL HYSTERECTOMY    ? HEMORROIDECTOMY    ? MOUTH SURGERY    ?  SEPTOPLASTY    ? ? ?Social History  ? ?Socioeconomic History  ? Marital status: Widowed  ?  Spouse name: Not on file  ? Number of children: Not on file  ? Years of education: Not on file  ? Highest education level: Not on file  ?Occupational History  ? Not on file  ?Tobacco Use  ? Smoking status: Never  ? Smokeless tobacco: Never  ?Vaping  Use  ? Vaping Use: Never used  ?Substance and Sexual Activity  ? Alcohol use: No  ? Drug use: No  ? Sexual activity: Not Currently  ?Other Topics Concern  ? Not on file  ?Social History Narrative  ? Jehovah Witness   ? ?Social Determinants of Health  ? ?Financial Resource Strain: Not on file  ?Food Insecurity: Not on file  ?Transportation Needs: Not on file  ?Physical Activity: Not on file  ?Stress: Not on file  ?Social Connections: Not on file  ?Intimate Partner Violence: Not on file  ? ? ?Family History  ?Problem Relation Age of Onset  ? Breast cancer Cousin   ?     mat cousin  ? ? ? ?Current Outpatient Medications:  ?  acyclovir (ZOVIRAX) 400 MG tablet, TAKE 1 TABLET BY MOUTH TWICE A DAY, Disp: 180 tablet, Rfl: 3 ?  albuterol (VENTOLIN HFA) 108 (90 Base) MCG/ACT inhaler, 2 inhalations every 4 (four) hours as needed., Disp: , Rfl:  ?  aspirin 81 MG chewable tablet, Chew by mouth daily., Disp: , Rfl:  ?  atorvastatin (LIPITOR) 20 MG tablet, Take 20 mg by mouth daily., Disp: , Rfl:  ?  busPIRone (BUSPAR) 10 MG tablet, Take 10 mg by mouth 2 (two) times daily., Disp: , Rfl:  ?  Calcium Carb-Cholecalciferol (CALCIUM 600 + D PO), Take 600 mg by mouth 5 (five) times daily. Increase calcium from $RemoveBefo'600mg'YiJYdJCgAUf$  4 times a day to $Remo'600mg'TgRum$  5 times a day starting today, Disp: , Rfl:  ?  chlorhexidine (PERIDEX) 0.12 % solution, 15 mLs 2 (two) times daily., Disp: , Rfl:  ?  dexamethasone (DECADRON) 4 MG tablet, Take by mouth., Disp: , Rfl:  ?  docusate sodium (COLACE) 100 MG capsule, Take 100 mg by mouth daily., Disp: , Rfl:  ?  montelukast (SINGULAIR) 10 MG tablet, Take 10 mg by mouth daily after breakfast., Disp: , Rfl:  ?  naproxen (NAPROSYN) 500 MG tablet, Take 500 mg by mouth 2 (two) times daily with a meal., Disp: , Rfl:  ?  nortriptyline (PAMELOR) 25 MG capsule, Take 25 mg by mouth at bedtime., Disp: , Rfl:  ?  omeprazole (PRILOSEC) 20 MG capsule, Take 20 mg by mouth daily., Disp: , Rfl:  ?  sertraline (ZOLOFT) 100 MG tablet, Take  100 mg by mouth 2 (two) times daily., Disp: , Rfl:  ?  traZODone (DESYREL) 50 MG tablet, Take 50 mg by mouth at bedtime., Disp: , Rfl:  ?  Vitamin D, Ergocalciferol, (DRISDOL) 50000 UNITS CAPS capsule, Take 50,000 Units by mouth every 30 (thirty) days. Once a month, Disp: , Rfl:  ?  alendronate (FOSAMAX) 70 MG tablet, 70 mg once a week. (Patient not taking: Reported on 05/02/2021), Disp: , Rfl:  ?  lenalidomide (REVLIMID) 15 MG capsule, Take 1 capsule (15 mg total) by mouth daily. Take for 21 days, then hold for 7 days. Repeat every 28 days. (Patient not taking: Reported on 07/21/2021), Disp: 21 capsule, Rfl: 0 ? ?Physical exam:  ?Vitals:  ? 07/21/21 0844  ?BP: (!) 148/87  ?  Pulse: (!) 101  ?Resp: 16  ?Temp: (!) 96.7 ?F (35.9 ?C)  ?TempSrc: Tympanic  ?Weight: 168 lb 4.8 oz (76.3 kg)  ?Height: $RemoveB'5\' 5"'yJhmaAVR$  (1.651 m)  ? ?Physical Exam ?Constitutional:   ?   General: She is not in acute distress. ?HENT:  ?   Mouth/Throat:  ?   Comments: No evidence of dental abscess.  Poor dentition.  There is an area of exposed jawbone over the right mandible ?Cardiovascular:  ?   Rate and Rhythm: Normal rate and regular rhythm.  ?   Heart sounds: Normal heart sounds.  ?Pulmonary:  ?   Effort: Pulmonary effort is normal.  ?   Breath sounds: Normal breath sounds.  ?Abdominal:  ?   General: Bowel sounds are normal.  ?   Palpations: Abdomen is soft.  ?Skin: ?   General: Skin is warm and dry.  ?Neurological:  ?   Mental Status: She is alert and oriented to person, place, and time.  ?  ? ?CMP Latest Ref Rng & Units 07/21/2021  ?Glucose 70 - 99 mg/dL 108(H)  ?BUN 8 - 23 mg/dL 26(H)  ?Creatinine 0.44 - 1.00 mg/dL 0.96  ?Sodium 135 - 145 mmol/L 139  ?Potassium 3.5 - 5.1 mmol/L 4.5  ?Chloride 98 - 111 mmol/L 106  ?CO2 22 - 32 mmol/L 26  ?Calcium 8.9 - 10.3 mg/dL 9.2  ?Total Protein 6.5 - 8.1 g/dL 6.1(L)  ?Total Bilirubin 0.3 - 1.2 mg/dL 0.5  ?Alkaline Phos 38 - 126 U/L 98  ?AST 15 - 41 U/L 15  ?ALT 0 - 44 U/L 16  ? ?CBC Latest Ref Rng & Units 07/21/2021   ?WBC 4.0 - 10.5 K/uL 5.8  ?Hemoglobin 12.0 - 15.0 g/dL 13.4  ?Hematocrit 36.0 - 46.0 % 40.9  ?Platelets 150 - 400 K/uL 282  ? ? ?No images are attached to the encounter. ? ?DG Ribs Bilateral W/Chest ? ?R

## 2021-08-18 ENCOUNTER — Inpatient Hospital Stay: Payer: Medicare HMO

## 2021-08-18 ENCOUNTER — Inpatient Hospital Stay: Payer: Medicare HMO | Attending: Oncology

## 2021-08-18 ENCOUNTER — Encounter: Payer: Self-pay | Admitting: Oncology

## 2021-08-18 ENCOUNTER — Inpatient Hospital Stay (HOSPITAL_BASED_OUTPATIENT_CLINIC_OR_DEPARTMENT_OTHER): Payer: Medicare HMO | Admitting: Oncology

## 2021-08-18 VITALS — BP 143/84 | HR 96 | Temp 97.1°F | Resp 16 | Wt 176.2 lb

## 2021-08-18 DIAGNOSIS — C9001 Multiple myeloma in remission: Secondary | ICD-10-CM | POA: Insufficient documentation

## 2021-08-18 DIAGNOSIS — Z5112 Encounter for antineoplastic immunotherapy: Secondary | ICD-10-CM | POA: Insufficient documentation

## 2021-08-18 DIAGNOSIS — Z79899 Other long term (current) drug therapy: Secondary | ICD-10-CM | POA: Insufficient documentation

## 2021-08-18 DIAGNOSIS — M879 Osteonecrosis, unspecified: Secondary | ICD-10-CM

## 2021-08-18 DIAGNOSIS — C9000 Multiple myeloma not having achieved remission: Secondary | ICD-10-CM

## 2021-08-18 DIAGNOSIS — D649 Anemia, unspecified: Secondary | ICD-10-CM

## 2021-08-18 DIAGNOSIS — Z5111 Encounter for antineoplastic chemotherapy: Secondary | ICD-10-CM | POA: Diagnosis not present

## 2021-08-18 DIAGNOSIS — Z7982 Long term (current) use of aspirin: Secondary | ICD-10-CM | POA: Insufficient documentation

## 2021-08-18 LAB — COMPREHENSIVE METABOLIC PANEL
ALT: 18 U/L (ref 0–44)
AST: 20 U/L (ref 15–41)
Albumin: 4 g/dL (ref 3.5–5.0)
Alkaline Phosphatase: 96 U/L (ref 38–126)
Anion gap: 6 (ref 5–15)
BUN: 21 mg/dL (ref 8–23)
CO2: 27 mmol/L (ref 22–32)
Calcium: 9.3 mg/dL (ref 8.9–10.3)
Chloride: 105 mmol/L (ref 98–111)
Creatinine, Ser: 0.92 mg/dL (ref 0.44–1.00)
GFR, Estimated: >60 mL/min (ref >60)
Glucose, Bld: 95 mg/dL (ref 70–99)
Potassium: 3.8 mmol/L (ref 3.5–5.1)
Sodium: 138 mmol/L (ref 135–145)
Total Bilirubin: 0.5 mg/dL (ref 0.3–1.2)
Total Protein: 6.6 g/dL (ref 6.5–8.1)

## 2021-08-18 LAB — CBC WITH DIFFERENTIAL/PLATELET
Abs Immature Granulocytes: 0.03 10*3/uL (ref 0.00–0.07)
Basophils Absolute: 0 10*3/uL (ref 0.0–0.1)
Basophils Relative: 1 %
Eosinophils Absolute: 0.4 10*3/uL (ref 0.0–0.5)
Eosinophils Relative: 5 %
HCT: 43.3 % (ref 36.0–46.0)
Hemoglobin: 14.5 g/dL (ref 12.0–15.0)
Immature Granulocytes: 0 %
Lymphocytes Relative: 8 %
Lymphs Abs: 0.6 10*3/uL — ABNORMAL LOW (ref 0.7–4.0)
MCH: 31.9 pg (ref 26.0–34.0)
MCHC: 33.5 g/dL (ref 30.0–36.0)
MCV: 95.4 fL (ref 80.0–100.0)
Monocytes Absolute: 0.3 10*3/uL (ref 0.1–1.0)
Monocytes Relative: 4 %
Neutro Abs: 5.9 10*3/uL (ref 1.7–7.7)
Neutrophils Relative %: 82 %
Platelets: 208 10*3/uL (ref 150–400)
RBC: 4.54 MIL/uL (ref 3.87–5.11)
RDW: 14.6 % (ref 11.5–15.5)
WBC: 7.2 10*3/uL (ref 4.0–10.5)
nRBC: 0 % (ref 0.0–0.2)

## 2021-08-18 MED ORDER — CYANOCOBALAMIN 1000 MCG/ML IJ SOLN
1000.0000 ug | Freq: Once | INTRAMUSCULAR | Status: AC
  Administered 2021-08-18: 1000 ug via INTRAMUSCULAR
  Filled 2021-08-18: qty 1

## 2021-08-18 MED ORDER — DIPHENHYDRAMINE HCL 25 MG PO CAPS
50.0000 mg | ORAL_CAPSULE | Freq: Once | ORAL | Status: AC
  Administered 2021-08-18: 50 mg via ORAL
  Filled 2021-08-18: qty 2

## 2021-08-18 MED ORDER — DARATUMUMAB-HYALURONIDASE-FIHJ 1800-30000 MG-UT/15ML ~~LOC~~ SOLN
1800.0000 mg | Freq: Once | SUBCUTANEOUS | Status: AC
  Administered 2021-08-18: 1800 mg via SUBCUTANEOUS
  Filled 2021-08-18: qty 15

## 2021-08-18 MED ORDER — ONDANSETRON HCL 4 MG PO TABS
8.0000 mg | ORAL_TABLET | Freq: Once | ORAL | Status: AC
  Administered 2021-08-18: 8 mg via ORAL
  Filled 2021-08-18: qty 2

## 2021-08-18 MED ORDER — DEXAMETHASONE 4 MG PO TABS: 20.0000 mg | ORAL_TABLET | Freq: Once | ORAL | Status: DC

## 2021-08-18 MED ORDER — ACETAMINOPHEN 325 MG PO TABS
650.0000 mg | ORAL_TABLET | Freq: Once | ORAL | Status: AC
  Administered 2021-08-18: 650 mg via ORAL
  Filled 2021-08-18: qty 2

## 2021-08-18 NOTE — Progress Notes (Signed)
? ? ? ?Hematology/Oncology Consult note ?[place]  ?[phone]) [phone] [phone] ? ?Patient Care Team: ?Denton Lank, MD as PCP - General (Family Medicine) ?Sindy Guadeloupe, MD as Consulting Physician (Hematology and Oncology)  ? ?Name of the patient: Laurie [name]  ?[id number]  ?Jan 03, 1946  ? ?Date of visit: 08/18/21 ? ?Diagnosis- stage III biclonal IgA multiple myeloma ? ?Chief complaint/ Reason for visit-treatment assessment prior to next cycle of Darzalex ? ?Heme/Onc history: [name] who was diagnosed with stage III biclonal IgA multiple myeloma in June 2021. SPEP was 5.0 gm/dL on 10/03/2019. Random urine revealed 196.6 mg/dL total protein with 58.3% M-spike.  Bone marrow biopsy on 11/13/2019 revealed a hypercellular bone marrow with extensive involvement by plasma cell neoplasm. Atypical plasma cells represented 70% of all cells in the aspirate and was associated with prominent interstitial infiltrates and diffuse sheets in the clot.  Plasma cells were kappa light chain restricted.   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   Bone survey on 10/26/2019 revealed small lytic lesions in the skull. There was no acute fracture. ?  ?Patient has been on daratumumab Revlimid dexamethasone regimen since July 2021.  She briefly received Retacrit in the past but has not required it for over 1 year now. ? ?Bisphosphonates had to be stopped after treatment was complicated by osteonecrosis of the jaw.  Patient also had 2 episodes of infection in that area and Revlimid has been on hold since March 2023. ?  ?Patient is a Sales promotion account executive Witness ?  ?  ? ?Interval history-patient has not heard back from  [facility] ED about oral surgery opinion for osteonecrosis of the jaw.  She is otherwise doing well and using salt water rinses as recommended by her dentist for osteonecrosis of the jaw.  She has some baseline neuropathy which is stable ? ?ECOG PS- 1 ?Pain scale- 0 ?Opioid associated constipation- no ? ?Review of systems- Review of Systems  ?Constitutional:  Positive for malaise/fatigue. Negative for chills, fever and weight loss.  ?HENT:  Negative for congestion, ear discharge and nosebleeds.   ?Eyes:  Negative for blurred vision.  ?Respiratory:  Negative for cough, hemoptysis, sputum production, shortness of breath and wheezing.   ?Cardiovascular:  Negative for chest pain, palpitations, orthopnea and claudication.  ?Gastrointestinal:  Negative for abdominal pain, blood in stool, constipation, diarrhea, heartburn, melena, nausea and vomiting.  ?Genitourinary:  Negative for dysuria, flank pain, frequency, hematuria and urgency.  ?Musculoskeletal:  Negative for back pain, joint pain and myalgias.  ?Skin:  Negative for rash.  ?Neurological:  Positive for sensory change (Peripheral neuropathy). Negative for dizziness, tingling, focal weakness, seizures, weakness and headaches.  ?Endo/Heme/Allergies:  Does not bruise/bleed easily.  ?Psychiatric/Behavioral:  Negative for depression and suicidal ideas. The patient does not have insomnia.    ? ? ?Allergies  ?Allergen Reactions  ? Levofloxacin   ?  Critical ?  ? Other Other (See Comments)  ?  Jehovah Witness (blood) ? ?Antihistamines   ? ? ? ?Past Medical History:  ?Diagnosis Date  ? Anxiety   ? Asthma   ? Cellulitis and abscess of oral soft tissues   ? Cellulitis and abscess of oral soft tissues 03/31/2021  ? pt in hospital and now out  as outpt  ? Depression   ? Head injury   ? Multiple myeloma ([place])   ? Osteoporosis   ? ? ? ?Past Surgical History:  ?Procedure Laterality Date  ? ABDOMINAL HYSTERECTOMY    ? HEMORROIDECTOMY    ? MOUTH SURGERY    ? SEPTOPLASTY    ? ? ?Social  History  ? ?Socioeconomic History  ? Marital status: Widowed  ?  Spouse name: Not on file  ? Number of children: Not on file  ? Years of education: Not on file  ? Highest education level: Not on file  ?Occupational History  ? Not on file  ?Tobacco Use  ? Smoking status: Never  ? Smokeless tobacco: Never  ?Vaping Use  ? Vaping Use: Never used  ?Substance and Sexual Activity  ? Alcohol use: No  ? Drug use: No  ? Sexual activity: Not Currently  ?Other Topics Concern  ? Not on file  ?Social History Narrative  ? Jehovah Witness   ? ?Social Determinants of Health  ? ?Financial Resource Strain: Not on file  ?Food Insecurity: Not on file  ?Transportation Needs: Not on file  ?Physical Activity: Not on file  ?Stress: Not on file  ?Social Connections: Not on file  ?Intimate Partner Violence: Not on file  ? ? ?Family History  ?Problem Relation Age of Onset  ? Breast cancer Cousin   ?     mat cousin  ? ? ? ?Current Outpatient Medications:  ?  acyclovir (ZOVIRAX) 400 MG tablet, TAKE 1 TABLET BY MOUTH TWICE A DAY, Disp: 180 tablet, Rfl: 3 ?  albuterol (VENTOLIN HFA) 108 (90 Base) MCG/ACT inhaler, 2 inhalations every 4 (four) hours as needed., Disp: , Rfl:  ?  aspirin 81 MG chewable tablet, Chew by mouth daily., Disp: , Rfl:  ?  atorvastatin (LIPITOR) 20 MG tablet, Take 20 mg by mouth daily., Disp: , Rfl:  ?  busPIRone (BUSPAR) 10 MG tablet, Take 10 mg by mouth 2 (two) times daily., Disp: , Rfl:  ?  Calcium Carb-Cholecalciferol (CALCIUM 600 + D PO), Take 600 mg by mouth 5 (five) times daily. Increase calcium from 685m 4 times a day to 6055m5 times a day starting today, Disp: , Rfl:  ?  dexamethasone (DECADRON) 4 MG tablet, Take by mouth., Disp: , Rfl:  ?  docusate sodium (COLACE) 100 MG capsule, Take 100 mg by mouth daily., Disp: , Rfl:  ?  montelukast (SINGULAIR) 10 MG tablet, Take 10 mg by mouth daily after breakfast., Disp: , Rfl:  ?  naproxen (NAPROSYN) 500 MG tablet, Take 500 mg by mouth 2 (two) times daily with a meal.,  Disp: , Rfl:  ?  nortriptyline (PAMELOR) 25 MG capsule, Take 25 mg by mouth at bedtime., Disp: , Rfl:  ?  omeprazole (PRILOSEC) 20 MG capsule, Take 20 mg by mouth daily., Disp: , Rfl:  ?  sertraline (ZOLOFT) 100 MG tablet, Take 100 mg by mouth 2 (two) times daily., Disp: , Rfl:  ?  traZODone (DESYREL) 50 MG tablet, Take 50 mg by mouth at bedtime., Disp: , Rfl:  ?  Vitamin D, Ergocalciferol, (DRISDOL) 50000 UNITS CAPS capsule, Take 50,000 Units by mouth every 30 (thirty) days. Once a month, Disp: , Rfl:  ?  chlorhexidine (PERIDEX) 0.12 % solution, 15 mLs 2 (two) times daily., Disp: , Rfl:  ?  lenalidomide (REVLIMID) 15 MG capsule, Take 1 capsule (15 mg total) by mouth daily. Take for 21 days, then hold for 7  days. Repeat every 28 days. (Patient not taking: Reported on 07/21/2021), Disp: 21 capsule, Rfl: 0 ? ?Physical exam:  ?Vitals:  ? 08/18/21 0833  ?BP: (!) 143/84  ?Pulse: 96  ?Resp: 16  ?Temp: (!) 97.1 ?F (36.2 ?C)  ?SpO2: 98%  ?Weight: 176 lb 3.2 oz (79.9 kg)  ? ?Physical Exam ?Constitutional:   ?   Comments: Ambulates with a walker.  Appears in no acute distress  ?HENT:  ?   Mouth/Throat:  ?   Comments: There is an area of exposed bone seen over the right mandible anteriorly not significantly changed as compared to her last visit ?Cardiovascular:  ?   Rate and Rhythm: Normal rate and regular rhythm.  ?   Heart sounds: Normal heart sounds.  ?Pulmonary:  ?   Effort: Pulmonary effort is normal.  ?   Breath sounds: Normal breath sounds.  ?Abdominal:  ?   General: Bowel sounds are normal.  ?   Palpations: Abdomen is soft.  ?Skin: ?   General: Skin is warm and dry.  ?Neurological:  ?   Mental Status: She is alert and oriented to person, place, and time.  ?  ? ? ?  Latest Ref Rng & Units 08/18/2021  ?  8:11 AM  ?CMP  ?Glucose 70 - 99 mg/dL 95    ?BUN 8 - 23 mg/dL 21    ?Creatinine 0.44 - 1.00 mg/dL 0.92    ?Sodium 135 - 145 mmol/L 138    ?Potassium 3.5 - 5.1 mmol/L 3.8    ?Chloride 98 - 111 mmol/L 105    ?CO2 22 - 32  mmol/L 27    ?Calcium 8.9 - 10.3 mg/dL 9.3    ?Total Protein 6.5 - 8.1 g/dL 6.6    ?Total Bilirubin 0.3 - 1.2 mg/dL 0.5    ?Alkaline Phos 38 - 126 U/L 96    ?AST 15 - 41 U/L 20    ?ALT 0 - 44 U/L 18    ? ? ?  Lat

## 2021-08-18 NOTE — Progress Notes (Signed)
Pt states that she has been feeling pretty weak and fatigued. Pt states her insurance has changed so she is not sure if biologics will pay for her revlimid. PCP will like to start the process to build a ramp at her home. ?

## 2021-08-18 NOTE — Patient Instructions (Signed)
[facility]  Discharge Instructions: °Thank you for choosing [facility] to provide your oncology and hematology care.  ° °If you have a lab appointment with the Cancer Center, please go directly to the Cancer Center and check in at the registration area. °  °Wear comfortable clothing and clothing appropriate for easy access to any Portacath or PICC line.  ° °We strive to give you quality time with your provider. You may need to reschedule your appointment if you arrive late (15 or more minutes).  Arriving late affects you and other patients whose appointments are after yours.  Also, if you miss three or more appointments without notifying the office, you may be dismissed from the clinic at the provider’s discretion.    °  °For prescription refill requests, have your pharmacy contact our office and allow 72 hours for refills to be completed.   ° °Today you received the following chemotherapy and/or immunotherapy agents     °  °To help prevent nausea and vomiting after your treatment, we encourage you to take your nausea medication as directed. ° °BELOW ARE SYMPTOMS THAT SHOULD BE REPORTED IMMEDIATELY: °*FEVER GREATER THAN 100.4 F (38 °C) OR HIGHER °*CHILLS OR SWEATING °*NAUSEA AND VOMITING THAT IS NOT CONTROLLED WITH YOUR NAUSEA MEDICATION °*UNUSUAL SHORTNESS OF BREATH °*UNUSUAL BRUISING OR BLEEDING °*URINARY PROBLEMS (pain or burning when urinating, or frequent urination) °*BOWEL PROBLEMS (unusual diarrhea, constipation, pain near the anus) °TENDERNESS IN MOUTH AND THROAT WITH OR WITHOUT PRESENCE OF ULCERS (sore throat, sores in mouth, or a toothache) °UNUSUAL RASH, SWELLING OR PAIN  °UNUSUAL VAGINAL DISCHARGE OR ITCHING  ° °Items with * indicate a potential emergency and should be followed up as soon as possible or go to the Emergency Department if any problems should occur. ° °Please show the CHEMOTHERAPY ALERT CARD or IMMUNOTHERAPY ALERT CARD at check-in to the  Emergency Department and triage nurse. ° °Should you have questions after your visit or need to cancel or reschedule your appointment, please contact [facility]  Dept: [phone]  and follow the prompts.  Office hours are 8:00 a.m. to 4:30 p.m. Monday - Friday. Please note that voicemails left after 4:00 p.m. may not be returned until the following business day.  We are closed weekends and major holidays. You have access to a nurse at all times for urgent questions. Please call the main number to the clinic Dept: [phone] and follow the prompts. ° ° °For any non-urgent questions, you may also contact your provider using MyChart. We now offer e-Visits for anyone 18 and older to request care online for non-urgent symptoms. For details visit [email or web address]. °  °Also download the MyChart app! Go to the app store, search "MyChart", open the app, select [facility], and log in with your MyChart username and password. ° °Due to Covid, a mask is required upon entering the hospital/clinic. If you do not have a mask, one will be given to you upon arrival. For doctor visits, patients may have 1 support person aged 18 or older with them. For treatment visits, patients cannot have anyone with them due to current Covid guidelines and our immunocompromised population.  ° °

## 2021-08-19 LAB — KAPPA/LAMBDA LIGHT CHAINS
Kappa free light chain: 4.1 mg/L (ref 3.3–19.4)
Kappa, lambda light chain ratio: 2.05 — ABNORMAL HIGH (ref 0.26–1.65)
Lambda free light chains: 2 mg/L — ABNORMAL LOW (ref 5.7–26.3)

## 2021-08-20 LAB — MULTIPLE MYELOMA PANEL, SERUM
Albumin SerPl Elph-Mcnc: 3.8 g/dL (ref 2.9–4.4)
Albumin/Glob SerPl: 2.1 — ABNORMAL HIGH (ref 0.7–1.7)
Alpha 1: 0.2 g/dL (ref 0.0–0.4)
Alpha2 Glob SerPl Elph-Mcnc: 0.7 g/dL (ref 0.4–1.0)
B-Globulin SerPl Elph-Mcnc: 0.7 g/dL (ref 0.7–1.3)
Gamma Glob SerPl Elph-Mcnc: 0.2 g/dL — ABNORMAL LOW (ref 0.4–1.8)
Globulin, Total: 1.9 g/dL — ABNORMAL LOW (ref 2.2–3.9)
IgA: 26 mg/dL — ABNORMAL LOW (ref 64–422)
IgG (Immunoglobin G), Serum: 232 mg/dL — ABNORMAL LOW (ref 586–1602)
IgM (Immunoglobulin M), Srm: 20 mg/dL — ABNORMAL LOW (ref 26–217)
Total Protein ELP: 5.7 g/dL — ABNORMAL LOW (ref 6.0–8.5)

## 2021-08-27 ENCOUNTER — Telehealth: Payer: Self-pay | Admitting: *Deleted

## 2021-08-27 NOTE — Telephone Encounter (Signed)
When I see her next time I will discuss restarting revlimid

## 2021-08-27 NOTE — Telephone Encounter (Signed)
Patient called reporting that she has not heard from [facility] regarding an appointment with oral surgeon and when she calls the oral surgeon office she is told that he cannot do anything for her. She is asking if you should restart her treatment. Please advise ?

## 2021-09-15 ENCOUNTER — Inpatient Hospital Stay (HOSPITAL_BASED_OUTPATIENT_CLINIC_OR_DEPARTMENT_OTHER): Payer: Medicare HMO | Admitting: Oncology

## 2021-09-15 ENCOUNTER — Telehealth: Payer: Self-pay | Admitting: *Deleted

## 2021-09-15 ENCOUNTER — Inpatient Hospital Stay: Payer: Medicare HMO

## 2021-09-15 ENCOUNTER — Encounter: Payer: Self-pay | Admitting: Hematology and Oncology

## 2021-09-15 ENCOUNTER — Other Ambulatory Visit (HOSPITAL_COMMUNITY): Payer: Self-pay

## 2021-09-15 ENCOUNTER — Telehealth: Payer: Self-pay

## 2021-09-15 ENCOUNTER — Other Ambulatory Visit: Payer: Self-pay | Admitting: *Deleted

## 2021-09-15 ENCOUNTER — Inpatient Hospital Stay: Payer: Medicare HMO | Attending: Oncology

## 2021-09-15 ENCOUNTER — Encounter: Payer: Self-pay | Admitting: Oncology

## 2021-09-15 VITALS — BP 147/77 | HR 109

## 2021-09-15 VITALS — BP 137/78 | HR 100 | Temp 98.1°F | Resp 16 | Ht 65.0 in | Wt 180.0 lb

## 2021-09-15 DIAGNOSIS — Z5112 Encounter for antineoplastic immunotherapy: Secondary | ICD-10-CM | POA: Diagnosis present

## 2021-09-15 DIAGNOSIS — C9000 Multiple myeloma not having achieved remission: Secondary | ICD-10-CM

## 2021-09-15 DIAGNOSIS — Z5111 Encounter for antineoplastic chemotherapy: Secondary | ICD-10-CM | POA: Diagnosis not present

## 2021-09-15 DIAGNOSIS — Z79899 Other long term (current) drug therapy: Secondary | ICD-10-CM | POA: Insufficient documentation

## 2021-09-15 DIAGNOSIS — D649 Anemia, unspecified: Secondary | ICD-10-CM

## 2021-09-15 DIAGNOSIS — Z7982 Long term (current) use of aspirin: Secondary | ICD-10-CM | POA: Insufficient documentation

## 2021-09-15 DIAGNOSIS — C9001 Multiple myeloma in remission: Secondary | ICD-10-CM | POA: Diagnosis present

## 2021-09-15 DIAGNOSIS — Z7952 Long term (current) use of systemic steroids: Secondary | ICD-10-CM | POA: Insufficient documentation

## 2021-09-15 LAB — COMPREHENSIVE METABOLIC PANEL
ALT: 23 U/L (ref 0–44)
AST: 27 U/L (ref 15–41)
Albumin: 4.2 g/dL (ref 3.5–5.0)
Alkaline Phosphatase: 111 U/L (ref 38–126)
Anion gap: 8 (ref 5–15)
BUN: 20 mg/dL (ref 8–23)
CO2: 25 mmol/L (ref 22–32)
Calcium: 8.9 mg/dL (ref 8.9–10.3)
Chloride: 103 mmol/L (ref 98–111)
Creatinine, Ser: 1 mg/dL (ref 0.44–1.00)
GFR, Estimated: 59 mL/min — ABNORMAL LOW (ref >60)
Glucose, Bld: 109 mg/dL — ABNORMAL HIGH (ref 70–99)
Potassium: 4 mmol/L (ref 3.5–5.1)
Sodium: 136 mmol/L (ref 135–145)
Total Bilirubin: 0.7 mg/dL (ref 0.3–1.2)
Total Protein: 6.7 g/dL (ref 6.5–8.1)

## 2021-09-15 LAB — CBC WITH DIFFERENTIAL/PLATELET
Abs Immature Granulocytes: 0.06 10*3/uL (ref 0.00–0.07)
Basophils Absolute: 0 10*3/uL (ref 0.0–0.1)
Basophils Relative: 0 %
Eosinophils Absolute: 0.1 10*3/uL (ref 0.0–0.5)
Eosinophils Relative: 1 %
HCT: 42.3 % (ref 36.0–46.0)
Hemoglobin: 14.5 g/dL (ref 12.0–15.0)
Immature Granulocytes: 1 %
Lymphocytes Relative: 4 %
Lymphs Abs: 0.4 10*3/uL — ABNORMAL LOW (ref 0.7–4.0)
MCH: 32.4 pg (ref 26.0–34.0)
MCHC: 34.3 g/dL (ref 30.0–36.0)
MCV: 94.6 fL (ref 80.0–100.0)
Monocytes Absolute: 0.2 10*3/uL (ref 0.1–1.0)
Monocytes Relative: 2 %
Neutro Abs: 10.4 10*3/uL — ABNORMAL HIGH (ref 1.7–7.7)
Neutrophils Relative %: 92 %
Platelets: 270 10*3/uL (ref 150–400)
RBC: 4.47 MIL/uL (ref 3.87–5.11)
RDW: 13.3 % (ref 11.5–15.5)
WBC: 11.2 10*3/uL — ABNORMAL HIGH (ref 4.0–10.5)
nRBC: 0 % (ref 0.0–0.2)

## 2021-09-15 MED ORDER — DEXAMETHASONE 4 MG PO TABS
20.0000 mg | ORAL_TABLET | Freq: Once | ORAL | Status: DC
  Filled 2021-09-15: qty 5

## 2021-09-15 MED ORDER — CYANOCOBALAMIN 1000 MCG/ML IJ SOLN
1000.0000 ug | Freq: Once | INTRAMUSCULAR | Status: AC
  Administered 2021-09-15: 1000 ug via INTRAMUSCULAR

## 2021-09-15 MED ORDER — DIPHENHYDRAMINE HCL 25 MG PO CAPS
50.0000 mg | ORAL_CAPSULE | Freq: Once | ORAL | Status: AC
  Administered 2021-09-15: 50 mg via ORAL
  Filled 2021-09-15: qty 2

## 2021-09-15 MED ORDER — ONDANSETRON HCL 4 MG PO TABS
8.0000 mg | ORAL_TABLET | Freq: Once | ORAL | Status: AC
  Administered 2021-09-15: 8 mg via ORAL
  Filled 2021-09-15: qty 2

## 2021-09-15 MED ORDER — ACETAMINOPHEN 325 MG PO TABS
650.0000 mg | ORAL_TABLET | Freq: Once | ORAL | Status: AC
  Administered 2021-09-15: 650 mg via ORAL
  Filled 2021-09-15: qty 2

## 2021-09-15 MED ORDER — LENALIDOMIDE 15 MG PO CAPS: 15.0000 mg | ORAL_CAPSULE | Freq: Every day | ORAL | 0 refills | Status: DC

## 2021-09-15 MED ORDER — DARATUMUMAB-HYALURONIDASE-FIHJ 1800-30000 MG-UT/15ML ~~LOC~~ SOLN
1800.0000 mg | Freq: Once | SUBCUTANEOUS | Status: AC
  Administered 2021-09-15: 1800 mg via SUBCUTANEOUS
  Filled 2021-09-15: qty 15

## 2021-09-15 NOTE — Patient Instructions (Signed)
[facility]  Discharge Instructions: ?Thank you for choosing [facility] to provide your oncology and hematology care.  ?If you have a lab appointment with the Cancer Center, please go directly to the Cancer Center and check in at the registration area. ? ?Wear comfortable clothing and clothing appropriate for easy access to any Portacath or PICC line.  ? ?We strive to give you quality time with your provider. You may need to reschedule your appointment if you arrive late (15 or more minutes).  Arriving late affects you and other patients whose appointments are after yours.  Also, if you miss three or more appointments without notifying the office, you may be dismissed from the clinic at the provider?s discretion.    ?  ?For prescription refill requests, have your pharmacy contact our office and allow 72 hours for refills to be completed.   ? ?Today you received the following chemotherapy and/or immunotherapy agents: Darzalex Faspro    ?  ?To help prevent nausea and vomiting after your treatment, we encourage you to take your nausea medication as directed. ? ?BELOW ARE SYMPTOMS THAT SHOULD BE REPORTED IMMEDIATELY: ?*FEVER GREATER THAN 100.4 F (38 ?C) OR HIGHER ?*CHILLS OR SWEATING ?*NAUSEA AND VOMITING THAT IS NOT CONTROLLED WITH YOUR NAUSEA MEDICATION ?*UNUSUAL SHORTNESS OF BREATH ?*UNUSUAL BRUISING OR BLEEDING ?*URINARY PROBLEMS (pain or burning when urinating, or frequent urination) ?*BOWEL PROBLEMS (unusual diarrhea, constipation, pain near the anus) ?TENDERNESS IN MOUTH AND THROAT WITH OR WITHOUT PRESENCE OF ULCERS (sore throat, sores in mouth, or a toothache) ?UNUSUAL RASH, SWELLING OR PAIN  ?UNUSUAL VAGINAL DISCHARGE OR ITCHING  ? ?Items with * indicate a potential emergency and should be followed up as soon as possible or go to the Emergency Department if any problems should occur. ? ?Please show the CHEMOTHERAPY ALERT CARD or IMMUNOTHERAPY ALERT CARD at  check-in to the Emergency Department and triage nurse. ? ?Should you have questions after your visit or need to cancel or reschedule your appointment, please contact [facility]  [phone] and follow the prompts.  Office hours are 8:00 a.m. to 4:30 p.m. Monday - Friday. Please note that voicemails left after 4:00 p.m. may not be returned until the following business day.  We are closed weekends and major holidays. You have access to a nurse at all times for urgent questions. Please call the main number to the clinic [phone] and follow the prompts. ? ?For any non-urgent questions, you may also contact your provider using MyChart. We now offer e-Visits for anyone 18 and older to request care online for non-urgent symptoms. For details visit [email or web address]. ?  ?Also download the MyChart app! Go to the app store, search "MyChart", open the app, select [facility], and log in with your MyChart username and password. ? ?Due to Covid, a mask is required upon entering the hospital/clinic. If you do not have a mask, one will be given to you upon arrival. For doctor visits, patients may have 1 support person aged 18 or older with them. For treatment visits, patients cannot have anyone with them due to current Covid guidelines and our immunocompromised population. Daratumumab; Hyaluronidase Injection ?What is this medication? ?DARATUMUMAB; HYALURONIDASE (dar a toom ue mab / hye al ur ON i dase) is a monoclonal antibody. Hyaluronidase is used to improve the effects of daratumumab. It treats certain types of cancer. Some of the cancers treated are multiple myeloma and light-chain amyloidosis. ?This medicine may be used   for other purposes; ask your health care provider or pharmacist if you have questions. ?COMMON BRAND NAME(S): DARZALEX FASPRO ?What should I tell my care team before I take this medication? ?They need to know if you have any of these conditions: ?heart  disease ?infection especially a viral infection such as chickenpox, cold sores, herpes, or hepatitis B ?lung or breathing disease ?an unusual or allergic reaction to daratumumab, hyaluronidase, other medicines, foods, dyes, or preservatives ?pregnant or trying to get pregnant ?breast-feeding ?How should I use this medication? ?This medicine is for injection under the skin. It is given by a health care professional in a hospital or clinic setting. ?Talk to your pediatrician regarding the use of this medicine in children. Special care may be needed. ?Overdosage: If you think you have taken too much of this medicine contact a poison control center or emergency room at once. ?NOTE: This medicine is only for you. Do not share this medicine with others. ?What if I miss a dose? ?Keep appointments for follow-up doses as directed. It is important not to miss your dose. Call your doctor or health care professional if you are unable to keep an appointment. ?What may interact with this medication? ?Interactions have not been studied. ?This list may not describe all possible interactions. Give your health care provider a list of all the medicines, herbs, non-prescription drugs, or dietary supplements you use. Also tell them if you smoke, drink alcohol, or use illegal drugs. Some items may interact with your medicine. ?What should I watch for while using this medication? ?Your condition will be monitored carefully while you are receiving this medicine. ?This medicine can cause serious allergic reactions. To reduce your risk, your health care provider may give you other medicine to take before receiving this one. Be sure to follow the directions from your health care provider. ?This medicine can affect the results of blood tests to match your blood type. These changes can last for up to 6 months after the final dose. Your healthcare provider will do blood tests to match your blood type before you start treatment. Tell all of your  healthcare providers that you are being treated with this medicine before receiving a blood transfusion. ?This medicine can affect the results of some tests used to determine treatment response; extra tests may be needed to evaluate response. ?Do not become pregnant while taking this medicine or for 3 months after stopping it. Women should inform their health care provider if they wish to become pregnant or think they might be pregnant. There is a potential for serious side effects to an unborn child. Talk to your health care provider for more information. Do not breast-feed an infant while taking this medicine. ?What side effects may I notice from receiving this medication? ?Side effects that you should report to your care team as soon as possible: ?Allergic reactions--skin rash, itching or hives, swelling of the face, lips, or tongue ?Blood clot--chest pain, shortness of breath, pain, swelling or warmth in the leg ?Blurred vision ?Fast, irregular heartbeat ?Infection--fever, chills, cough, sore throat, pain or trouble passing urine ?Injection reactions--dizziness, fast heartbeat, feeling faint or lightheaded, falls, headache, increase in blood pressure, nausea, vomiting, or wheezing or trouble breathing with loud or whistling sounds ?Low red blood cell counts--trouble breathing, feeling faint, lightheaded or falls, unusually weak or tired ?Unusual bleeding or bruising ?Side effects that usually do not require medical attention (report these to your care team if they continue or are bothersome): ?Back pain ?Constipation ?Diarrhea ?  Pain, tingling, numbness in the hands or feet ?Pain, redness, or irritation at site where injected ?Muscle cramp or pain ?Swelling of the ankles, feet, hands ?Tiredness ?Trouble sleeping ?This list may not describe all possible side effects. Call your doctor for medical advice about side effects. You may report side effects to FDA at 1-800-FDA-1088. ?Where should I keep my  medication? ?This drug is given in a hospital or clinic and will not be stored at home. ?NOTE: This sheet is a summary. It may not cover all possible information. If you have questions about this medicine, talk to your doctor, pharma

## 2021-09-15 NOTE — Progress Notes (Signed)
? ? ? ?Hematology/Oncology Consult note ?[facility]  ?[phone] [phone] ? ?Patient Care Team: ?Denton Lank, MD as PCP - General (Family Medicine) ?Sindy Guadeloupe, MD as Consulting Physician (Hematology and Oncology)  ? ?Name of the patient: Laurie [name]  ?[id number]  ?1945-08-07  ? ?Date of visit: 09/15/21 ? ?Diagnosis- stage III biclonal IgA multiple myeloma ? ?Chief complaint/ Reason for visit-on treatment assessment prior to next cycle of Darzalex ? ?Heme/Onc history: [name] who was diagnosed with stage III biclonal IgA multiple myeloma in June 2021. SPEP was 5.0 gm/dL on 10/03/2019. Random urine revealed 196.6 mg/dL total protein with 58.3% M-spike.  Bone marrow biopsy on 11/13/2019 revealed a hypercellular bone marrow with extensive involvement by plasma cell neoplasm. Atypical plasma cells represented 70% of all cells in the aspirate and was associated with prominent interstitial infiltrates and diffuse sheets in the clot.  Plasma cells were kappa light chain restricted.   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   Bone survey on 10/26/2019 revealed small lytic lesions in the skull. There was no acute fracture. ?  ?Patient has been on daratumumab Revlimid dexamethasone regimen since July 2021.  She briefly received Retacrit in the past but has not required it for over 1 year now. ?  ?Bisphosphonates had to be stopped after treatment was complicated by osteonecrosis of the jaw.  Patient also had 2 episodes of infection in that area and Revlimid has been on hold since March 2023. ?  ?Patient is a Sales promotion account executive Witness ? ?Interval history-patient is doing well overall.   Denies any pain during swallowing or jaw pain. ? ?ECOG PS- 1 ?Pain scale- 0 ? ? ?Review of systems- Review of Systems  ?Constitutional:  Negative for chills, fever, malaise/fatigue and weight loss.  ?HENT:  Negative for congestion, ear discharge and nosebleeds.   ?Eyes:  Negative for blurred vision.  ?Respiratory:  Negative for cough, hemoptysis, sputum production, shortness of breath and wheezing.   ?Cardiovascular:  Negative for chest pain, palpitations, orthopnea and claudication.  ?Gastrointestinal:  Negative for abdominal pain, blood in stool, constipation, diarrhea, heartburn, melena, nausea and vomiting.  ?Genitourinary:  Negative for dysuria, flank pain, frequency, hematuria and urgency.  ?Musculoskeletal:  Negative for back pain, joint pain and myalgias.  ?Skin:  Negative for rash.  ?Neurological:  Negative for dizziness, tingling, focal weakness, seizures, weakness and headaches.  ?Endo/Heme/Allergies:  Does not bruise/bleed easily.  ?Psychiatric/Behavioral:  Negative for depression and suicidal ideas. The patient does not have insomnia.    ? ?Allergies  ?Allergen Reactions  ? Levofloxacin   ?  Critical ?  ? Other Other (See Comments)  ?  Jehovah Witness (blood) ? ?Antihistamines   ? ? ? ?Past Medical History:  ?Diagnosis Date  ? Anxiety   ? Asthma   ? Cellulitis and abscess of oral soft tissues   ? Cellulitis and abscess of oral soft tissues 03/31/2021  ? pt in hospital and now out as outpt  ? Depression   ? Head injury   ? Multiple myeloma ([place])   ? Osteoporosis   ? ? ? ?Past Surgical History:  ?Procedure Laterality Date  ? ABDOMINAL HYSTERECTOMY    ? HEMORROIDECTOMY    ? MOUTH SURGERY    ?  SEPTOPLASTY    ? ? ?Social History  ? ?Socioeconomic History  ? Marital status: Widowed  ?  Spouse name: Not on file  ? Number of children: Not on file  ? Years of education: Not on file  ? Highest education level: Not on file  ?Occupational History  ? Not on file  ?Tobacco Use  ? Smoking status: Never  ? Smokeless  tobacco: Never  ?Vaping Use  ? Vaping Use: Never used  ?Substance and Sexual Activity  ? Alcohol use: No  ? Drug use: No  ? Sexual activity: Not Currently  ?Other Topics Concern  ? Not on file  ?Social History Narrative  ? Jehovah Witness   ? ?Social Determinants of Health  ? ?Financial Resource Strain: Not on file  ?Food Insecurity: Not on file  ?Transportation Needs: Not on file  ?Physical Activity: Not on file  ?Stress: Not on file  ?Social Connections: Not on file  ?Intimate Partner Violence: Not on file  ? ? ?Family History  ?Problem Relation Age of Onset  ? Breast cancer Cousin   ?     mat cousin  ? ? ? ?Current Outpatient Medications:  ?  acyclovir (ZOVIRAX) 400 MG tablet, TAKE 1 TABLET BY MOUTH TWICE A DAY, Disp: 180 tablet, Rfl: 3 ?  albuterol (VENTOLIN HFA) 108 (90 Base) MCG/ACT inhaler, 2 inhalations every 4 (four) hours as needed., Disp: , Rfl:  ?  aspirin 81 MG chewable tablet, Chew by mouth daily., Disp: , Rfl:  ?  atorvastatin (LIPITOR) 20 MG tablet, Take 20 mg by mouth daily., Disp: , Rfl:  ?  busPIRone (BUSPAR) 10 MG tablet, Take 10 mg by mouth 2 (two) times daily., Disp: , Rfl:  ?  Calcium Carb-Cholecalciferol (CALCIUM 600 + D PO), Take 600 mg by mouth 5 (five) times daily. Increase calcium from $RemoveBefo'600mg'FbSaVRIeJJn$  4 times a day to $Remo'600mg'DhYDr$  5 times a day starting today, Disp: , Rfl:  ?  chlorhexidine (PERIDEX) 0.12 % solution, 15 mLs 2 (two) times daily., Disp: , Rfl:  ?  dexamethasone (DECADRON) 4 MG tablet, Take by mouth., Disp: , Rfl:  ?  docusate sodium (COLACE) 100 MG capsule, Take 100 mg by mouth daily., Disp: , Rfl:  ?  lenalidomide (REVLIMID) 15 MG capsule, Take 1 capsule (15 mg total) by mouth daily. Take for 21 days, then hold for 7 days. Repeat every 28 days. (Patient not taking: Reported on 07/21/2021), Disp: 21 capsule, Rfl: 0 ?  montelukast (SINGULAIR) 10 MG tablet, Take 10 mg by mouth daily after breakfast., Disp: , Rfl:  ?  naproxen (NAPROSYN) 500 MG tablet, Take 500 mg by mouth 2 (two) times daily  with a meal., Disp: , Rfl:  ?  nortriptyline (PAMELOR) 25 MG capsule, Take 25 mg by mouth at bedtime., Disp: , Rfl:  ?  omeprazole (PRILOSEC) 20 MG capsule, Take 20 mg by mouth daily., Disp: , Rfl:  ?  sertraline (ZOLOFT) 100 MG tablet, Take 100 mg by mouth 2 (two) times daily., Disp: , Rfl:  ?  traZODone (DESYREL) 50 MG tablet, Take 50 mg by mouth at bedtime., Disp: , Rfl:  ?  Vitamin D, Ergocalciferol, (DRISDOL) 50000 UNITS CAPS capsule, Take 50,000 Units by mouth every 30 (thirty) days. Once a month, Disp: , Rfl:  ? ?Physical exam:  ?Vitals:  ? 09/15/21 0848  ?BP: 137/78  ?Pulse: 100  ?Resp: 16  ?Temp: 98.1 ?F (36.7 ?C)  ?TempSrc: Oral  ?Weight: 180 lb (81.6 kg)  ?Height:  $'5\' 5"'L$  (1.651 m)  ? ?Physical Exam ?Constitutional:   ?   General: She is not in acute distress. ?Cardiovascular:  ?   Rate and Rhythm: Normal rate and regular rhythm.  ?   Heart sounds: Normal heart sounds.  ?Pulmonary:  ?   Effort: Pulmonary effort is normal.  ?   Breath sounds: Normal breath sounds.  ?Skin: ?   General: Skin is warm and dry.  ?Neurological:  ?   Mental Status: She is alert and oriented to person, place, and time.  ?  ? ? ?  Latest Ref Rng & Units 08/18/2021  ?  8:11 AM  ?CMP  ?Glucose 70 - 99 mg/dL 95    ?BUN 8 - 23 mg/dL 21    ?Creatinine 0.44 - 1.00 mg/dL 0.92    ?Sodium 135 - 145 mmol/L 138    ?Potassium 3.5 - 5.1 mmol/L 3.8    ?Chloride 98 - 111 mmol/L 105    ?CO2 22 - 32 mmol/L 27    ?Calcium 8.9 - 10.3 mg/dL 9.3    ?Total Protein 6.5 - 8.1 g/dL 6.6    ?Total Bilirubin 0.3 - 1.2 mg/dL 0.5    ?Alkaline Phos 38 - 126 U/L 96    ?AST 15 - 41 U/L 20    ?ALT 0 - 44 U/L 18    ? ? ?  Latest Ref Rng & Units 09/15/2021  ?  8:22 AM  ?CBC  ?WBC 4.0 - 10.5 K/uL 11.2    ?Hemoglobin 12.0 - 15.0 g/dL 14.5    ?Hematocrit 36.0 - 46.0 % 42.3    ?Platelets 150 - 400 K/uL 270    ? ? ? ?Assessment and plan- Patient is a 76 y.o. 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  She is here for on treatment assessment prior to next cycle  of Darzalex ? ?Counts okay to proceed with cycle 23 of Darzalex today.We will continue to use every 4 weeks until progression or toxicity.  Serum free kappa light chains are within normal limits with a ra

## 2021-09-15 NOTE — Telephone Encounter (Signed)
Sharyn Lull at Dr. Emogene Morgan office stated that the dentist and his team are in [place] today and she will reach out to them in regards to pts referral to [facility] and see what is going on with the referral; Dr's team is suppose to reach out to [place] and inform us of the referral.  ? ?

## 2021-09-15 NOTE — Telephone Encounter (Signed)
Patient had told us this am that she is not heard from [facility] about appt for her. Dr. Lewanda Rife had referred her there. Called the office of Dr Lewanda Rife and spoke to [place] and she said the policy is now that you have to refer through a portal and she sent the referral in 07/22/2021, then she called them 3/31 and no response with [facility]. On 5/5 they called and left a message and still nothing yet.  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 She gave me the phone number [phone] and fax # [phone]. The office says that is the right place to go so they can put her in hospital and do surgery. ?I will try calling tomorrow to see what we can do. ?

## 2021-09-17 ENCOUNTER — Telehealth: Payer: Self-pay

## 2021-09-17 NOTE — Telephone Encounter (Signed)
Reached out to [facility] in regards to pts referral for stage 2 osteonecrosis and the urgency for the referral. Per Robin, she does not see a referral in the ysstem and it needs to be re-entered. Called Dr. Emogene Morgan office again and spoke to [place] and made her aware of the situation. Per Roselyn Reef, she will re-enter referral for pt and make sure it goes through. Will f/u by the end of week to check on the status.  ?

## 2021-09-24 ENCOUNTER — Telehealth: Payer: Self-pay

## 2021-09-24 NOTE — Telephone Encounter (Signed)
Reached out to [facility] in regards to pts referral for stage 2 osteonecrosis was transferred to the patient care coordinator Anderson Malta for Dr. Sherwood Gambler and recieved her VM [phone]; left a VM explaining this referral was sent back at the end of March and pt has not heard from [facility] at all; referral was re-sent by her dentist office around 5/19 through the portal and has not yet been scheduled. Referral is urgent as pt has been dealing with mouth pain since March. Left a call back phone number and will try again tomorrow.

## 2021-09-25 ENCOUNTER — Telehealth: Payer: Self-pay

## 2021-09-25 NOTE — Telephone Encounter (Signed)
Janett Billow from [facility] returned my call in regards to pts referral and states she has tried calling the pt but the phone number they have on file has been disconnected. I provided Janett Billow with [phone] to contact the pt as I spoke to her yesterday. Janett Billow stated she will call the pt this morning in order to have the appointment set. up. I will reach out to pt later on today to check on the status.

## 2021-10-07 ENCOUNTER — Other Ambulatory Visit: Payer: Self-pay | Admitting: Oncology

## 2021-10-13 ENCOUNTER — Inpatient Hospital Stay: Payer: Medicare HMO | Attending: Oncology

## 2021-10-13 ENCOUNTER — Encounter: Payer: Self-pay | Admitting: Oncology

## 2021-10-13 ENCOUNTER — Other Ambulatory Visit: Payer: Self-pay | Admitting: *Deleted

## 2021-10-13 ENCOUNTER — Inpatient Hospital Stay: Payer: Medicare HMO

## 2021-10-13 ENCOUNTER — Inpatient Hospital Stay (HOSPITAL_BASED_OUTPATIENT_CLINIC_OR_DEPARTMENT_OTHER): Payer: Medicare HMO | Admitting: Oncology

## 2021-10-13 ENCOUNTER — Other Ambulatory Visit: Payer: Self-pay

## 2021-10-13 VITALS — BP 132/84 | HR 108 | Temp 97.8°F | Resp 20 | Wt 171.9 lb

## 2021-10-13 DIAGNOSIS — Z7982 Long term (current) use of aspirin: Secondary | ICD-10-CM | POA: Diagnosis not present

## 2021-10-13 DIAGNOSIS — Z79899 Other long term (current) drug therapy: Secondary | ICD-10-CM | POA: Diagnosis not present

## 2021-10-13 DIAGNOSIS — Z5111 Encounter for antineoplastic chemotherapy: Secondary | ICD-10-CM

## 2021-10-13 DIAGNOSIS — C9001 Multiple myeloma in remission: Secondary | ICD-10-CM | POA: Diagnosis present

## 2021-10-13 DIAGNOSIS — Z7952 Long term (current) use of systemic steroids: Secondary | ICD-10-CM | POA: Diagnosis not present

## 2021-10-13 DIAGNOSIS — C9000 Multiple myeloma not having achieved remission: Secondary | ICD-10-CM

## 2021-10-13 LAB — COMPREHENSIVE METABOLIC PANEL
ALT: 19 U/L (ref 0–44)
AST: 18 U/L (ref 15–41)
Albumin: 4.3 g/dL (ref 3.5–5.0)
Alkaline Phosphatase: 125 U/L (ref 38–126)
Anion gap: 5 (ref 5–15)
BUN: 17 mg/dL (ref 8–23)
CO2: 29 mmol/L (ref 22–32)
Calcium: 9.4 mg/dL (ref 8.9–10.3)
Chloride: 105 mmol/L (ref 98–111)
Creatinine, Ser: 0.93 mg/dL (ref 0.44–1.00)
GFR, Estimated: >60 mL/min (ref >60)
Glucose, Bld: 117 mg/dL — ABNORMAL HIGH (ref 70–99)
Potassium: 4.1 mmol/L (ref 3.5–5.1)
Sodium: 139 mmol/L (ref 135–145)
Total Bilirubin: 0.9 mg/dL (ref 0.3–1.2)
Total Protein: 7.1 g/dL (ref 6.5–8.1)

## 2021-10-13 LAB — CBC WITH DIFFERENTIAL/PLATELET
Abs Immature Granulocytes: 0.09 10*3/uL — ABNORMAL HIGH (ref 0.00–0.07)
Basophils Absolute: 0.1 10*3/uL (ref 0.0–0.1)
Basophils Relative: 0 %
Eosinophils Absolute: 0.2 10*3/uL (ref 0.0–0.5)
Eosinophils Relative: 1 %
HCT: 44.3 % (ref 36.0–46.0)
Hemoglobin: 14.7 g/dL (ref 12.0–15.0)
Immature Granulocytes: 1 %
Lymphocytes Relative: 3 %
Lymphs Abs: 0.4 10*3/uL — ABNORMAL LOW (ref 0.7–4.0)
MCH: 32.4 pg (ref 26.0–34.0)
MCHC: 33.2 g/dL (ref 30.0–36.0)
MCV: 97.6 fL (ref 80.0–100.0)
Monocytes Absolute: 0.4 10*3/uL (ref 0.1–1.0)
Monocytes Relative: 3 %
Neutro Abs: 11.9 10*3/uL — ABNORMAL HIGH (ref 1.7–7.7)
Neutrophils Relative %: 92 %
Platelets: 332 10*3/uL (ref 150–400)
RBC: 4.54 MIL/uL (ref 3.87–5.11)
RDW: 12.9 % (ref 11.5–15.5)
WBC: 13 10*3/uL — ABNORMAL HIGH (ref 4.0–10.5)
nRBC: 0 % (ref 0.0–0.2)

## 2021-10-13 MED ORDER — ACYCLOVIR 400 MG PO TABS: 400.0000 mg | ORAL_TABLET | Freq: Two times a day (BID) | ORAL | 3 refills | Status: DC

## 2021-10-13 MED ORDER — DEXAMETHASONE 4 MG PO TABS: 20.0000 mg | ORAL_TABLET | ORAL | 2 refills | Status: AC

## 2021-10-13 NOTE — Progress Notes (Signed)
Hematology/Oncology Consult note [facility]  Telephone:([phone] [phone]  Patient Care Team: Denton Lank, MD as PCP - General (Family Medicine) Sindy Guadeloupe, MD as Consulting Physician (Hematology and Oncology)   Name of the patient: Laurie [name]  [id number]  Sep 04, 1945   Date of visit: 10/13/21  Diagnosis- stage III biclonal IgA multiple myeloma  Chief complaint/ Reason for visit-treatment assessment prior to next cycle of Darzalex  Heme/Onc history: [name] who was diagnosed with stage III biclonal IgA multiple myeloma in June 2021. SPEP was 5.0 gm/dL on 10/03/2019. Random urine revealed 196.6 mg/dL total protein with 58.3% M-spike.  Bone marrow biopsy on 11/13/2019 revealed a hypercellular bone marrow with extensive involvement by plasma cell neoplasm. Atypical plasma cells represented 70% of all cells in the aspirate and was associated with prominent interstitial infiltrates and diffuse sheets in the clot.  Plasma cells were kappa light chain restricted.   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   Bone survey on 10/26/2019 revealed small lytic lesions in the skull. There was no acute fracture.   Patient has been on daratumumab Revlimid dexamethasone regimen since July 2021.  She briefly received Retacrit in the past but has not required it for over 1 year now.   Bisphosphonates had to be stopped after treatment was complicated by osteonecrosis of the jaw.  Patient also had 2 episodes of infection in that area and Revlimid has been on hold since March 2023.   Patient is a Sales promotion account executive Witness  Interval history-patient reports some pain and  swelling at the floor of her mouth.  Denies any fever.  Denies any difficulty swallowing.  She has an appointment coming up with [facility] later this week.  ECOG PS- 1 Pain scale- 3 Opioid associated constipation- no  Review of systems- Review of Systems  Constitutional:  Positive for malaise/fatigue. Negative for chills, fever and weight loss.  HENT:  Negative for congestion, ear discharge and nosebleeds.   Eyes:  Negative for blurred vision.  Respiratory:  Negative for cough, hemoptysis, sputum production, shortness of breath and wheezing.   Cardiovascular:  Negative for chest pain, palpitations, orthopnea and claudication.  Gastrointestinal:  Negative for abdominal pain, blood in stool, constipation, diarrhea, heartburn, melena, nausea and vomiting.  Genitourinary:  Negative for dysuria, flank pain, frequency, hematuria and urgency.  Musculoskeletal:  Negative for back pain, joint pain and myalgias.  Skin:  Negative for rash.  Neurological:  Negative for dizziness, tingling, focal weakness, seizures, weakness and headaches.  Endo/Heme/Allergies:  Does not bruise/bleed easily.  Psychiatric/Behavioral:  Negative for depression and suicidal ideas. The patient does not have insomnia.       Allergies  Allergen Reactions   Levofloxacin     Critical    Other Other (See Comments)    Jehovah Witness (blood)  Antihistamines      Past Medical History:  Diagnosis Date   Anxiety    Asthma    Cellulitis and abscess of oral soft tissues    Cellulitis and abscess of oral soft tissues 03/31/2021   pt in hospital and now out as outpt   Depression    Head injury    Multiple myeloma ([place])    Osteoporosis  Past Surgical History:  Procedure Laterality Date   ABDOMINAL HYSTERECTOMY     HEMORROIDECTOMY     MOUTH SURGERY     SEPTOPLASTY      Social History   Socioeconomic History   Marital status: Widowed    Spouse name: Not on file   Number of children: Not on file   Years of  education: Not on file   Highest education level: Not on file  Occupational History   Not on file  Tobacco Use   Smoking status: Never   Smokeless tobacco: Never  Vaping Use   Vaping Use: Never used  Substance and Sexual Activity   Alcohol use: No   Drug use: No   Sexual activity: Not Currently  Other Topics Concern   Not on file  Social History Narrative   Jehovah Witness    Social Determinants of Health   Financial Resource Strain: Not on file  Food Insecurity: Not on file  Transportation Needs: Not on file  Physical Activity: Not on file  Stress: Not on file  Social Connections: Not on file  Intimate Partner Violence: Not on file    Family History  Problem Relation Age of Onset   Prostate cancer Brother    Throat cancer Maternal Aunt    Breast cancer Cousin        mat cousin   Breast cancer Cousin    Thyroid cancer Other      Current Outpatient Medications:    albuterol (VENTOLIN HFA) 108 (90 Base) MCG/ACT inhaler, 2 inhalations every 4 (four) hours as needed., Disp: , Rfl:    aspirin 81 MG chewable tablet, Chew by mouth daily., Disp: , Rfl:    atorvastatin (LIPITOR) 20 MG tablet, Take 20 mg by mouth daily., Disp: , Rfl:    busPIRone (BUSPAR) 10 MG tablet, Take 10 mg by mouth 2 (two) times daily., Disp: , Rfl:    Calcium Carb-Cholecalciferol (CALCIUM 600 + D PO), Take 600 mg by mouth 5 (five) times daily. Increase calcium from $RemoveBefo'600mg'kmhLpIwkiXY$  4 times a day to $Remo'600mg'rgvIf$  5 times a day starting today, Disp: , Rfl:    chlorhexidine (PERIDEX) 0.12 % solution, 15 mLs 2 (two) times daily., Disp: , Rfl:    docusate sodium (COLACE) 100 MG capsule, Take 100 mg by mouth 2 (two) times daily., Disp: , Rfl:    lenalidomide (REVLIMID) 15 MG capsule, Take 1 capsule (15 mg total) by mouth daily. Take for 21 days, then hold for 7 days. Repeat every 28 days.  Rems # [id number] on 09/15/2021, Disp: 21 capsule, Rfl: 0   montelukast (SINGULAIR) 10 MG tablet, Take 10 mg by mouth daily after breakfast.,  Disp: , Rfl:    naproxen (NAPROSYN) 500 MG tablet, Take 500 mg by mouth 2 (two) times daily with a meal., Disp: , Rfl:    nortriptyline (PAMELOR) 25 MG capsule, Take 25 mg by mouth at bedtime., Disp: , Rfl:    omeprazole (PRILOSEC) 20 MG capsule, Take 20 mg by mouth daily., Disp: , Rfl:    sertraline (ZOLOFT) 100 MG tablet, Take 100 mg by mouth 2 (two) times daily., Disp: , Rfl:    traZODone (DESYREL) 50 MG tablet, Take 50 mg by mouth at bedtime., Disp: , Rfl:    Vitamin D, Ergocalciferol, (DRISDOL) 50000 UNITS CAPS capsule, Take 50,000 Units by mouth every 30 (thirty) days. Once a month, Disp: , Rfl:    acyclovir (ZOVIRAX) 400 MG tablet, Take 1 tablet (400 mg total) by mouth  2 (two) times daily., Disp: 180 tablet, Rfl: 3   dexamethasone (DECADRON) 4 MG tablet, Take 5 tablets (20 mg total) by mouth once a week., Disp: 120 tablet, Rfl: 2  Physical exam:  Vitals:   10/13/21 0933  BP: 132/84  Pulse: (!) 108  Resp: 20  Temp: 97.8 F (36.6 C)  SpO2: 100%  Weight: 171 lb 14.4 oz (78 kg)   Physical Exam HENT:     Mouth/Throat:     Comments: Exposed bony area noted in the lower jaw.  There is also mild soft tissue swelling noted in the floor of the mouth.  Oropharynx otherwise appears clear.  No palpable cervical adenopathy Cardiovascular:     Rate and Rhythm: Normal rate and regular rhythm.     Heart sounds: Normal heart sounds.  Pulmonary:     Effort: Pulmonary effort is normal.     Breath sounds: Normal breath sounds.  Abdominal:     General: Bowel sounds are normal.     Palpations: Abdomen is soft.  Skin:    General: Skin is warm and dry.  Neurological:     Mental Status: She is alert and oriented to person, place, and time.         Latest Ref Rng & Units 10/13/2021    9:02 AM  CMP  Glucose 70 - 99 mg/dL 190   BUN 8 - 23 mg/dL 17   Creatinine 4.14 - 1.00 mg/dL 3.15   Sodium 112 - 849 mmol/L 139   Potassium 3.5 - 5.1 mmol/L 4.1   Chloride 98 - 111 mmol/L 105   CO2 22 - 32  mmol/L 29   Calcium 8.9 - 10.3 mg/dL 9.4   Total Protein 6.5 - 8.1 g/dL 7.1   Total Bilirubin 0.3 - 1.2 mg/dL 0.9   Alkaline Phos 38 - 126 U/L 125   AST 15 - 41 U/L 18   ALT 0 - 44 U/L 19       Latest Ref Rng & Units 10/13/2021    9:02 AM  CBC  WBC 4.0 - 10.5 K/uL 13.0   Hemoglobin 12.0 - 15.0 g/dL 99.3   Hematocrit 30.0 - 46.0 % 44.3   Platelets 150 - 400 K/uL 332     Assessment and plan- Patient is a 76 y.o. 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  She is here for on treatment assessment prior to next cycle of Darzalex   Previously had a abscess involving the floor of the mouth which had to be drained by ENT back in November 2022.  Presently patient reports some pain and swelling in that area but is able to swallow without any difficulty.  She also has about 1 weeks worth of Augmentin with her which she will start taking today.  I am holding off on Darzalex today and I have asked her to hold off on taking any further Decadron at this time.  Patient also reports that she has not yet restarted her Revlimid and will not do so at this time.I will await further recommendations from [facility] as far as her oral surgery appointment is concerned.  She has not received any bisphosphonates now since November 2022.  She will not be receiving any further doses of Xgeva given her osteonecrosis of the jaw as well.    I will see her back in 4 weeks with CBC with differential CMP myeloma panel and serum free light chains for next dose of Darzalex  Visit Diagnosis 1. Multiple myeloma not having achieved remission ([place])      Dr. Randa Evens, MD, MPH [facility] at [facility] [phone] 10/13/2021 4:38 PM

## 2021-10-13 NOTE — Progress Notes (Signed)
No Darzalex today per Dr. Janese Banks.

## 2021-10-14 LAB — KAPPA/LAMBDA LIGHT CHAINS
Kappa free light chain: 3.5 mg/L (ref 3.3–19.4)
Kappa, lambda light chain ratio: 1.84 — ABNORMAL HIGH (ref 0.26–1.65)
Lambda free light chains: 1.9 mg/L — ABNORMAL LOW (ref 5.7–26.3)

## 2021-10-17 ENCOUNTER — Other Ambulatory Visit: Payer: Self-pay | Admitting: Dentistry

## 2021-10-17 DIAGNOSIS — T458X5A Adverse effect of other primarily systemic and hematological agents, initial encounter: Secondary | ICD-10-CM

## 2021-10-17 LAB — MULTIPLE MYELOMA PANEL, SERUM
Albumin SerPl Elph-Mcnc: 3.7 g/dL (ref 2.9–4.4)
Albumin/Glob SerPl: 1.7 (ref 0.7–1.7)
Alpha 1: 0.3 g/dL (ref 0.0–0.4)
Alpha2 Glob SerPl Elph-Mcnc: 0.9 g/dL (ref 0.4–1.0)
B-Globulin SerPl Elph-Mcnc: 0.8 g/dL (ref 0.7–1.3)
Gamma Glob SerPl Elph-Mcnc: 0.3 g/dL — ABNORMAL LOW (ref 0.4–1.8)
Globulin, Total: 2.3 g/dL (ref 2.2–3.9)
IgA: 20 mg/dL — ABNORMAL LOW (ref 64–422)
IgG (Immunoglobin G), Serum: 264 mg/dL — ABNORMAL LOW (ref 586–1602)
IgM (Immunoglobulin M), Srm: 14 mg/dL — ABNORMAL LOW (ref 26–217)
M Protein SerPl Elph-Mcnc: 0.1 g/dL — ABNORMAL HIGH
Total Protein ELP: 6 g/dL (ref 6.0–8.5)

## 2021-10-20 ENCOUNTER — Ambulatory Visit: Admission: RE | Admit: 2021-10-20 | Payer: Medicare HMO | Source: Ambulatory Visit

## 2021-10-21 ENCOUNTER — Ambulatory Visit: Payer: Medicare HMO

## 2021-10-27 ENCOUNTER — Ambulatory Visit
Admission: RE | Admit: 2021-10-27 | Discharge: 2021-10-27 | Disposition: A | Discharge status: Home / Self Care | Payer: Medicare HMO | Source: Ambulatory Visit | Attending: Dentistry | Admitting: Dentistry

## 2021-10-27 DIAGNOSIS — T458X5A Adverse effect of other primarily systemic and hematological agents, initial encounter: Secondary | ICD-10-CM | POA: Diagnosis present

## 2021-10-27 DIAGNOSIS — M87180 Osteonecrosis due to drugs, jaw: Secondary | ICD-10-CM | POA: Insufficient documentation

## 2021-10-27 MED ORDER — IOHEXOL 300 MG/ML  SOLN
75.0000 mL | Freq: Once | INTRAMUSCULAR | Status: AC | PRN
  Administered 2021-10-27: 75 mL via INTRAVENOUS

## 2021-11-06 ENCOUNTER — Encounter: Payer: Self-pay | Admitting: Oncology

## 2021-11-06 ENCOUNTER — Encounter: Payer: Self-pay | Admitting: Hematology and Oncology

## 2021-11-10 ENCOUNTER — Telehealth: Payer: Self-pay

## 2021-11-10 ENCOUNTER — Inpatient Hospital Stay (HOSPITAL_BASED_OUTPATIENT_CLINIC_OR_DEPARTMENT_OTHER): Payer: Medicare HMO | Admitting: Oncology

## 2021-11-10 ENCOUNTER — Inpatient Hospital Stay: Payer: Medicare HMO

## 2021-11-10 ENCOUNTER — Inpatient Hospital Stay: Payer: Medicare HMO | Attending: Oncology

## 2021-11-10 ENCOUNTER — Encounter: Payer: Self-pay | Admitting: Oncology

## 2021-11-10 VITALS — BP 139/74 | HR 88 | Temp 97.1°F | Resp 16

## 2021-11-10 VITALS — BP 134/76 | HR 101 | Temp 96.6°F | Resp 18 | Wt 182.4 lb

## 2021-11-10 DIAGNOSIS — Z79624 Long term (current) use of inhibitors of nucleotide synthesis: Secondary | ICD-10-CM | POA: Insufficient documentation

## 2021-11-10 DIAGNOSIS — Z79899 Other long term (current) drug therapy: Secondary | ICD-10-CM | POA: Insufficient documentation

## 2021-11-10 DIAGNOSIS — C9000 Multiple myeloma not having achieved remission: Secondary | ICD-10-CM

## 2021-11-10 DIAGNOSIS — Z801 Family history of malignant neoplasm of trachea, bronchus and lung: Secondary | ICD-10-CM | POA: Diagnosis not present

## 2021-11-10 DIAGNOSIS — Z8042 Family history of malignant neoplasm of prostate: Secondary | ICD-10-CM | POA: Insufficient documentation

## 2021-11-10 DIAGNOSIS — Z7982 Long term (current) use of aspirin: Secondary | ICD-10-CM | POA: Insufficient documentation

## 2021-11-10 DIAGNOSIS — Z5111 Encounter for antineoplastic chemotherapy: Secondary | ICD-10-CM

## 2021-11-10 DIAGNOSIS — D649 Anemia, unspecified: Secondary | ICD-10-CM | POA: Diagnosis not present

## 2021-11-10 DIAGNOSIS — Z5112 Encounter for antineoplastic immunotherapy: Secondary | ICD-10-CM | POA: Insufficient documentation

## 2021-11-10 DIAGNOSIS — Z803 Family history of malignant neoplasm of breast: Secondary | ICD-10-CM | POA: Diagnosis not present

## 2021-11-10 DIAGNOSIS — Z808 Family history of malignant neoplasm of other organs or systems: Secondary | ICD-10-CM | POA: Diagnosis not present

## 2021-11-10 DIAGNOSIS — Z7952 Long term (current) use of systemic steroids: Secondary | ICD-10-CM | POA: Diagnosis not present

## 2021-11-10 LAB — CBC WITH DIFFERENTIAL/PLATELET
Abs Immature Granulocytes: 0.05 10*3/uL (ref 0.00–0.07)
Basophils Absolute: 0.1 10*3/uL (ref 0.0–0.1)
Basophils Relative: 1 %
Eosinophils Absolute: 0.3 10*3/uL (ref 0.0–0.5)
Eosinophils Relative: 3 %
HCT: 41.3 % (ref 36.0–46.0)
Hemoglobin: 13.9 g/dL (ref 12.0–15.0)
Immature Granulocytes: 1 %
Lymphocytes Relative: 10 %
Lymphs Abs: 1 10*3/uL (ref 0.7–4.0)
MCH: 32.6 pg (ref 26.0–34.0)
MCHC: 33.7 g/dL (ref 30.0–36.0)
MCV: 96.9 fL (ref 80.0–100.0)
Monocytes Absolute: 0.6 10*3/uL (ref 0.1–1.0)
Monocytes Relative: 6 %
Neutro Abs: 8.1 10*3/uL — ABNORMAL HIGH (ref 1.7–7.7)
Neutrophils Relative %: 79 %
Platelets: 292 10*3/uL (ref 150–400)
RBC: 4.26 MIL/uL (ref 3.87–5.11)
RDW: 12.9 % (ref 11.5–15.5)
WBC: 10.2 10*3/uL (ref 4.0–10.5)
nRBC: 0 % (ref 0.0–0.2)

## 2021-11-10 LAB — COMPREHENSIVE METABOLIC PANEL
ALT: 18 U/L (ref 0–44)
AST: 16 U/L (ref 15–41)
Albumin: 4.2 g/dL (ref 3.5–5.0)
Alkaline Phosphatase: 96 U/L (ref 38–126)
Anion gap: 8 (ref 5–15)
BUN: 23 mg/dL (ref 8–23)
CO2: 27 mmol/L (ref 22–32)
Calcium: 9 mg/dL (ref 8.9–10.3)
Chloride: 102 mmol/L (ref 98–111)
Creatinine, Ser: 1.04 mg/dL — ABNORMAL HIGH (ref 0.44–1.00)
GFR, Estimated: 56 mL/min — ABNORMAL LOW (ref >60)
Glucose, Bld: 89 mg/dL (ref 70–99)
Potassium: 4 mmol/L (ref 3.5–5.1)
Sodium: 137 mmol/L (ref 135–145)
Total Bilirubin: 0.6 mg/dL (ref 0.3–1.2)
Total Protein: 6.5 g/dL (ref 6.5–8.1)

## 2021-11-10 MED ORDER — ACETAMINOPHEN 325 MG PO TABS
650.0000 mg | ORAL_TABLET | Freq: Once | ORAL | Status: AC
  Administered 2021-11-10: 650 mg via ORAL
  Filled 2021-11-10: qty 2

## 2021-11-10 MED ORDER — ONDANSETRON HCL 4 MG PO TABS
8.0000 mg | ORAL_TABLET | Freq: Once | ORAL | Status: AC
  Administered 2021-11-10: 8 mg via ORAL
  Filled 2021-11-10: qty 2

## 2021-11-10 MED ORDER — DIPHENHYDRAMINE HCL 25 MG PO CAPS
50.0000 mg | ORAL_CAPSULE | Freq: Once | ORAL | Status: AC
  Administered 2021-11-10: 50 mg via ORAL
  Filled 2021-11-10: qty 2

## 2021-11-10 MED ORDER — CYANOCOBALAMIN 1000 MCG/ML IJ SOLN
1000.0000 ug | Freq: Once | INTRAMUSCULAR | Status: AC
  Administered 2021-11-10: 1000 ug via INTRAMUSCULAR
  Filled 2021-11-10: qty 1

## 2021-11-10 MED ORDER — DEXAMETHASONE 4 MG PO TABS: 20.0000 mg | ORAL_TABLET | Freq: Once | ORAL | Status: DC

## 2021-11-10 MED ORDER — DARATUMUMAB-HYALURONIDASE-FIHJ 1800-30000 MG-UT/15ML ~~LOC~~ SOLN
1800.0000 mg | Freq: Once | SUBCUTANEOUS | Status: AC
  Administered 2021-11-10: 1800 mg via SUBCUTANEOUS
  Filled 2021-11-10: qty 15

## 2021-11-10 NOTE — Telephone Encounter (Signed)
LVM with the oral surgeon Dr. Donnella Bi office. Asked them to call back in regards to plan for patient at visit on Friday. Phone number [phone]

## 2021-11-10 NOTE — Progress Notes (Signed)
Hematology/Oncology Consult note [facility]  Telephone:([phone] Fax:([phone]  Patient Care Team: Hillery Aldo, MD as PCP - General (Family Medicine) Creig Hines, MD as Consulting Physician (Hematology and Oncology)   Name of the patient: Laurie [name]  [id number]  1945-09-15   Date of visit: 11/10/21  Diagnosis- stage III biclonal IgA multiple myeloma  Chief complaint/ Reason for visit-on treatment assessment prior to next cycle of Darzalex  Heme/Onc history: [name] who was diagnosed with stage III biclonal IgA multiple myeloma in June 2021. SPEP was 5.0 gm/dL on 71/93/4299. Random urine revealed 196.6 mg/dL total protein with 58.3% M-spike.  Bone marrow biopsy on 11/13/2019 revealed a hypercellular bone marrow with extensive involvement by plasma cell neoplasm. Atypical plasma cells represented 70% of all cells in the aspirate and was associated with prominent interstitial infiltrates and diffuse sheets in the clot.  Plasma cells were kappa light chain restricted.   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   Bone survey on 10/26/2019 revealed small lytic lesions in the skull. There was no acute fracture.   Patient has been on daratumumab Revlimid dexamethasone regimen since July 2021.  She briefly received Retacrit in the past but has not required it for over 1 year now.   Bisphosphonates had to be stopped after treatment was complicated by osteonecrosis of the jaw.  Patient also had 2 episodes of infection in that area and Revlimid has been on hold since March 2023.   Patient is a TEFL teacher Witness  Interval history-patient was seen by oral surgery  at [facility] and underwent a CT maxillofacial with contrastWhich shows evidence of chronic osteomyelitis as well as osteonecrosis involving the mandible on the right side.  No evidence of acute tongue or sublingual abscess.  She has another appointment with oral surgery this week with plans for potential surgery in the future.  ECOG PS- 1 Pain scale- 0  Review of systems- Review of Systems  Constitutional:  Negative for chills, fever, malaise/fatigue and weight loss.  HENT:  Negative for congestion, ear discharge and nosebleeds.   Eyes:  Negative for blurred vision.  Respiratory:  Negative for cough, hemoptysis, sputum production, shortness of breath and wheezing.   Cardiovascular:  Negative for chest pain, palpitations, orthopnea and claudication.  Gastrointestinal:  Negative for abdominal pain, blood in stool, constipation, diarrhea, heartburn, melena, nausea and vomiting.  Genitourinary:  Negative for dysuria, flank pain, frequency, hematuria and urgency.  Musculoskeletal:  Negative for back pain, joint pain and myalgias.  Skin:  Negative for rash.  Neurological:  Negative for dizziness, tingling, focal weakness, seizures, weakness and headaches.  Endo/Heme/Allergies:  Does not bruise/bleed easily.  Psychiatric/Behavioral:  Negative for depression and suicidal ideas. The patient does not have insomnia.       Allergies  Allergen Reactions   Levofloxacin     Critical    Other Other (See Comments)    Jehovah Witness (blood)  Antihistamines      Past Medical History:  Diagnosis Date   Anxiety    Asthma    Cellulitis and abscess of oral soft tissues    Cellulitis and abscess of oral soft tissues 03/31/2021   pt in hospital and now out as outpt  Depression    Head injury    Multiple myeloma ([place])    Osteoporosis      Past Surgical History:  Procedure Laterality Date   ABDOMINAL HYSTERECTOMY     HEMORROIDECTOMY     MOUTH SURGERY     SEPTOPLASTY      Social History    Socioeconomic History   Marital status: Widowed    Spouse name: Not on file   Number of children: Not on file   Years of education: Not on file   Highest education level: Not on file  Occupational History   Not on file  Tobacco Use   Smoking status: Never   Smokeless tobacco: Never  Vaping Use   Vaping Use: Never used  Substance and Sexual Activity   Alcohol use: No   Drug use: No   Sexual activity: Not Currently  Other Topics Concern   Not on file  Social History Narrative   Jehovah Witness    Social Determinants of Health   Financial Resource Strain: Not on file  Food Insecurity: Not on file  Transportation Needs: Not on file  Physical Activity: Not on file  Stress: Not on file  Social Connections: Not on file  Intimate Partner Violence: Not on file    Family History  Problem Relation Age of Onset   Prostate cancer Brother    Throat cancer Maternal Aunt    Breast cancer Cousin        mat cousin   Breast cancer Cousin    Thyroid cancer Other      Current Outpatient Medications:    acyclovir (ZOVIRAX) 400 MG tablet, Take 1 tablet (400 mg total) by mouth 2 (two) times daily., Disp: 180 tablet, Rfl: 3   albuterol (VENTOLIN HFA) 108 (90 Base) MCG/ACT inhaler, 2 inhalations every 4 (four) hours as needed., Disp: , Rfl:    aspirin 81 MG chewable tablet, Chew by mouth daily., Disp: , Rfl:    atorvastatin (LIPITOR) 20 MG tablet, Take 20 mg by mouth daily., Disp: , Rfl:    busPIRone (BUSPAR) 10 MG tablet, Take 10 mg by mouth 2 (two) times daily., Disp: , Rfl:    Calcium Carb-Cholecalciferol (CALCIUM 600 + D PO), Take 600 mg by mouth 5 (five) times daily. Increase calcium from $RemoveBefo'600mg'GXGHpFHrMSp$  4 times a day to $Remo'600mg'RzNda$  5 times a day starting today, Disp: , Rfl:    chlorhexidine (PERIDEX) 0.12 % solution, 15 mLs 2 (two) times daily., Disp: , Rfl:    dexamethasone (DECADRON) 4 MG tablet, Take 5 tablets (20 mg total) by mouth once a week., Disp: 120 tablet, Rfl: 2   docusate sodium  (COLACE) 100 MG capsule, Take 100 mg by mouth 2 (two) times daily., Disp: , Rfl:    lenalidomide (REVLIMID) 15 MG capsule, Take 1 capsule (15 mg total) by mouth daily. Take for 21 days, then hold for 7 days. Repeat every 28 days.  Rems # [id number] on 09/15/2021, Disp: 21 capsule, Rfl: 0   montelukast (SINGULAIR) 10 MG tablet, Take 10 mg by mouth daily after breakfast., Disp: , Rfl:    naproxen (NAPROSYN) 500 MG tablet, Take 500 mg by mouth 2 (two) times daily with a meal., Disp: , Rfl:    nortriptyline (PAMELOR) 25 MG capsule, Take 25 mg by mouth at bedtime., Disp: , Rfl:    omeprazole (PRILOSEC) 20 MG capsule, Take 20 mg by mouth daily., Disp: , Rfl:    ondansetron (ZOFRAN) 8 MG tablet, Take 8 mg by  mouth every 8 (eight) hours as needed., Disp: , Rfl:    sertraline (ZOLOFT) 100 MG tablet, Take 100 mg by mouth 2 (two) times daily., Disp: , Rfl:    traZODone (DESYREL) 50 MG tablet, Take 50 mg by mouth at bedtime., Disp: , Rfl:    Vitamin D, Ergocalciferol, (DRISDOL) 50000 UNITS CAPS capsule, Take 50,000 Units by mouth every 30 (thirty) days. Once a month, Disp: , Rfl:   Physical exam:  Vitals:   11/10/21 0901  BP: 134/76  Pulse: (!) 101  Resp: 18  Temp: (!) 96.6 F (35.9 C)  SpO2: 98%  Weight: 182 lb 6.4 oz (82.7 kg)   Physical Exam HENT:     Mouth/Throat:     Comments: Osteonecrosis of the right mandible Cardiovascular:     Rate and Rhythm: Normal rate and regular rhythm.     Heart sounds: Normal heart sounds.  Pulmonary:     Effort: Pulmonary effort is normal.     Breath sounds: Normal breath sounds.  Abdominal:     General: Bowel sounds are normal.     Palpations: Abdomen is soft.  Skin:    General: Skin is warm and dry.  Neurological:     Mental Status: She is alert and oriented to person, place, and time.         Latest Ref Rng & Units 10/13/2021    9:02 AM  CMP  Glucose 70 - 99 mg/dL 117   BUN 8 - 23 mg/dL 17   Creatinine 0.44 - 1.00 mg/dL 0.93   Sodium 135 - 145  mmol/L 139   Potassium 3.5 - 5.1 mmol/L 4.1   Chloride 98 - 111 mmol/L 105   CO2 22 - 32 mmol/L 29   Calcium 8.9 - 10.3 mg/dL 9.4   Total Protein 6.5 - 8.1 g/dL 7.1   Total Bilirubin 0.3 - 1.2 mg/dL 0.9   Alkaline Phos 38 - 126 U/L 125   AST 15 - 41 U/L 18   ALT 0 - 44 U/L 19       Latest Ref Rng & Units 10/13/2021    9:02 AM  CBC  WBC 4.0 - 10.5 K/uL 13.0   Hemoglobin 12.0 - 15.0 g/dL 14.7   Hematocrit 36.0 - 46.0 % 44.3   Platelets 150 - 400 K/uL 332     No images are attached to the encounter.  CT MAXILLOFACIAL W CONTRAST  Result Date: 10/27/2021 CLINICAL DATA:  Swelling under the center of the jaw. Osteonecrosis of the jaw. Preoperative exam. History of multiple myeloma. EXAM: CT MAXILLOFACIAL WITH CONTRAST TECHNIQUE: Multidetector CT imaging of the maxillofacial structures was performed with intravenous contrast. Multiplanar CT image reconstructions were also generated. RADIATION DOSE REDUCTION: This exam was performed according to the departmental dose-optimization program which includes automated exposure control, adjustment of the mA and/or kV according to patient size and/or use of iterative reconstruction technique. CONTRAST:  3mL OMNIPAQUE IOHEXOL 300 MG/ML  SOLN COMPARISON:  Neck CT 03/30/2021 FINDINGS: The study suffers from motion degradation. Osseous: Since the prior study, the patient has had extraction of left mandibular tooth 30. There is slight increase in relative sclerotic density within the right body of the mandible. No other bone finding of note. Various other tooth extractions. Orbits: Normal Sinuses: Paranasal sinuses are clear. No acute or chronic inflammatory changes. Soft tissues: Resolution of the previously seen abscess of the inferior tongue/sublingual space. There may be mild scarring relative to the previous surgical approach. No evidence of  a recurrent drainable collection. Other soft tissues of the region are unremarkable. Limited intracranial: Normal  IMPRESSION: No evidence of recurrent tongue or sublingual abscess. Mild increased density of the soft tissues of the submandibular region underlying the area of the previous abscess felt most likely to represent sequela of previous drainage. No evidence of a recurrent drainable collection. Interval extraction of left mandibular tooth 30 without complicating feature. Various other maxillary extractions. Slight increase in sclerotic change of the body of the mandible on the right since the prior study consistent with osteo necrosis. The differential diagnosis is chronic osteomyelitis. Chronic extraction socket of right mandibular tooth 21 could conceivably be a source of infection. Electronically Signed   By: Nelson Chimes M.D.   On: 10/27/2021 11:45     Assessment and plan- Patient is a 76 y.o. 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  She is here for on treatment assessment prior to next cycle of monthly Darzalex  Counts okay to proceed with cycle 23 of Darzalex which she gets monthly.  This does not typically induce significant cytopenias and while I am waiting to hear from oral surgery about her plans for future surgery, I will proceed with treatment today.  I am continuing to hold Revlimid until I hear back from oral surgery.  Myeloma labs from today are pending but labs from June 2023 showed stable M protein which fluctuates from not detected to 0.1 g and free light chain ratio has been stable.   Patient will be seen by covering NP for possible Darzalex in 4 weeks and I will see her back in 8 weeks.  Decision to restart Revlimid will be based on her upcoming surgery.  If there is no surgery planned and she will start Revlimid with next cycle.  She also gets B12 with each visit   Visit Diagnosis 1. Encounter for antineoplastic chemotherapy   2. Multiple myeloma not having achieved remission ([place])      Dr. Randa Evens, MD, MPH [facility] at [facility] [phone] 11/10/2021 8:55 AM

## 2021-11-10 NOTE — Progress Notes (Signed)
Pt monitored for 15 minutes post darzalex injection. VSS and pt ambulatory at discharge

## 2021-11-10 NOTE — Patient Instructions (Signed)
[facility]  Discharge Instructions: Thank you for choosing [facility] to provide your oncology and hematology care.  If you have a lab appointment with the Cancer Center, please go directly to the Cancer Center and check in at the registration area.  Wear comfortable clothing and clothing appropriate for easy access to any Portacath or PICC line.   We strive to give you quality time with your provider. You may need to reschedule your appointment if you arrive late (15 or more minutes).  Arriving late affects you and other patients whose appointments are after yours.  Also, if you miss three or more appointments without notifying the office, you may be dismissed from the clinic at the provider's discretion.      For prescription refill requests, have your pharmacy contact our office and allow 72 hours for refills to be completed.    Today you received the following chemotherapy and/or immunotherapy agents Darzalex      To help prevent nausea and vomiting after your treatment, we encourage you to take your nausea medication as directed.  BELOW ARE SYMPTOMS THAT SHOULD BE REPORTED IMMEDIATELY: *FEVER GREATER THAN 100.4 F (38 C) OR HIGHER *CHILLS OR SWEATING *NAUSEA AND VOMITING THAT IS NOT CONTROLLED WITH YOUR NAUSEA MEDICATION *UNUSUAL SHORTNESS OF BREATH *UNUSUAL BRUISING OR BLEEDING *URINARY PROBLEMS (pain or burning when urinating, or frequent urination) *BOWEL PROBLEMS (unusual diarrhea, constipation, pain near the anus) TENDERNESS IN MOUTH AND THROAT WITH OR WITHOUT PRESENCE OF ULCERS (sore throat, sores in mouth, or a toothache) UNUSUAL RASH, SWELLING OR PAIN  UNUSUAL VAGINAL DISCHARGE OR ITCHING   Items with * indicate a potential emergency and should be followed up as soon as possible or go to the Emergency Department if any problems should occur.  Please show the CHEMOTHERAPY ALERT CARD or IMMUNOTHERAPY ALERT CARD at check-in to  the Emergency Department and triage nurse.  Should you have questions after your visit or need to cancel or reschedule your appointment, please contact [facility]  [phone] and follow the prompts.  Office hours are 8:00 a.m. to 4:30 p.m. Monday - Friday. Please note that voicemails left after 4:00 p.m. may not be returned until the following business day.  We are closed weekends and major holidays. You have access to a nurse at all times for urgent questions. Please call the main number to the clinic [phone] and follow the prompts.  For any non-urgent questions, you may also contact your provider using MyChart. We now offer e-Visits for anyone 18 and older to request care online for non-urgent symptoms. For details visit [email or web address].   Also download the MyChart app! Go to the app store, search "MyChart", open the app, select [facility], and log in with your MyChart username and password.  Masks are optional in the cancer centers. If you would like for your care team to wear a mask while they are taking care of you, please let them know. For doctor visits, patients may have with them one support person who is at least 74 years old. At this time, visitors are not allowed in the infusion area.   

## 2021-11-11 LAB — KAPPA/LAMBDA LIGHT CHAINS
Kappa free light chain: 4.6 mg/L (ref 3.3–19.4)
Kappa, lambda light chain ratio: 2.42 — ABNORMAL HIGH (ref 0.26–1.65)
Lambda free light chains: 1.9 mg/L — ABNORMAL LOW (ref 5.7–26.3)

## 2021-11-14 LAB — MULTIPLE MYELOMA PANEL, SERUM
Albumin SerPl Elph-Mcnc: 3.6 g/dL (ref 2.9–4.4)
Albumin/Glob SerPl: 1.9 — ABNORMAL HIGH (ref 0.7–1.7)
Alpha 1: 0.2 g/dL (ref 0.0–0.4)
Alpha2 Glob SerPl Elph-Mcnc: 0.7 g/dL (ref 0.4–1.0)
B-Globulin SerPl Elph-Mcnc: 0.9 g/dL (ref 0.7–1.3)
Gamma Glob SerPl Elph-Mcnc: 0.2 g/dL — ABNORMAL LOW (ref 0.4–1.8)
Globulin, Total: 2 g/dL — ABNORMAL LOW (ref 2.2–3.9)
IgA: 19 mg/dL — ABNORMAL LOW (ref 64–422)
IgG (Immunoglobin G), Serum: 222 mg/dL — ABNORMAL LOW (ref 586–1602)
IgM (Immunoglobulin M), Srm: 13 mg/dL — ABNORMAL LOW (ref 26–217)
Total Protein ELP: 5.6 g/dL — ABNORMAL LOW (ref 6.0–8.5)

## 2021-11-24 ENCOUNTER — Other Ambulatory Visit: Payer: Self-pay

## 2021-12-02 ENCOUNTER — Other Ambulatory Visit: Payer: Self-pay

## 2021-12-05 ENCOUNTER — Other Ambulatory Visit: Payer: Self-pay

## 2021-12-08 ENCOUNTER — Inpatient Hospital Stay: Payer: Medicare HMO | Attending: Oncology

## 2021-12-08 ENCOUNTER — Inpatient Hospital Stay: Payer: Medicare HMO

## 2021-12-08 ENCOUNTER — Inpatient Hospital Stay (HOSPITAL_BASED_OUTPATIENT_CLINIC_OR_DEPARTMENT_OTHER): Payer: Medicare HMO | Admitting: Nurse Practitioner

## 2021-12-08 VITALS — BP 152/82 | HR 94 | Temp 96.0°F | Resp 17

## 2021-12-08 VITALS — BP 148/90 | HR 103 | Temp 96.8°F | Resp 14 | Wt 185.0 lb

## 2021-12-08 DIAGNOSIS — Z5112 Encounter for antineoplastic immunotherapy: Secondary | ICD-10-CM | POA: Diagnosis present

## 2021-12-08 DIAGNOSIS — E538 Deficiency of other specified B group vitamins: Secondary | ICD-10-CM | POA: Insufficient documentation

## 2021-12-08 DIAGNOSIS — Z79624 Long term (current) use of inhibitors of nucleotide synthesis: Secondary | ICD-10-CM | POA: Insufficient documentation

## 2021-12-08 DIAGNOSIS — C9000 Multiple myeloma not having achieved remission: Secondary | ICD-10-CM

## 2021-12-08 DIAGNOSIS — Z7952 Long term (current) use of systemic steroids: Secondary | ICD-10-CM | POA: Insufficient documentation

## 2021-12-08 DIAGNOSIS — M879 Osteonecrosis, unspecified: Secondary | ICD-10-CM | POA: Diagnosis not present

## 2021-12-08 DIAGNOSIS — Z79899 Other long term (current) drug therapy: Secondary | ICD-10-CM | POA: Insufficient documentation

## 2021-12-08 DIAGNOSIS — Z7982 Long term (current) use of aspirin: Secondary | ICD-10-CM | POA: Insufficient documentation

## 2021-12-08 DIAGNOSIS — D649 Anemia, unspecified: Secondary | ICD-10-CM

## 2021-12-08 LAB — CBC WITH DIFFERENTIAL/PLATELET
Abs Immature Granulocytes: 0.04 10*3/uL (ref 0.00–0.07)
Basophils Absolute: 0.1 10*3/uL (ref 0.0–0.1)
Basophils Relative: 1 %
Eosinophils Absolute: 0.4 10*3/uL (ref 0.0–0.5)
Eosinophils Relative: 4 %
HCT: 41.8 % (ref 36.0–46.0)
Hemoglobin: 14.1 g/dL (ref 12.0–15.0)
Immature Granulocytes: 0 %
Lymphocytes Relative: 13 %
Lymphs Abs: 1.2 10*3/uL (ref 0.7–4.0)
MCH: 32.5 pg (ref 26.0–34.0)
MCHC: 33.7 g/dL (ref 30.0–36.0)
MCV: 96.3 fL (ref 80.0–100.0)
Monocytes Absolute: 0.6 10*3/uL (ref 0.1–1.0)
Monocytes Relative: 7 %
Neutro Abs: 7.1 10*3/uL (ref 1.7–7.7)
Neutrophils Relative %: 75 %
Platelets: 280 10*3/uL (ref 150–400)
RBC: 4.34 MIL/uL (ref 3.87–5.11)
RDW: 12.2 % (ref 11.5–15.5)
WBC: 9.5 10*3/uL (ref 4.0–10.5)
nRBC: 0 % (ref 0.0–0.2)

## 2021-12-08 LAB — COMPREHENSIVE METABOLIC PANEL
ALT: 15 U/L (ref 0–44)
AST: 17 U/L (ref 15–41)
Albumin: 4.2 g/dL (ref 3.5–5.0)
Alkaline Phosphatase: 83 U/L (ref 38–126)
Anion gap: 7 (ref 5–15)
BUN: 23 mg/dL (ref 8–23)
CO2: 26 mmol/L (ref 22–32)
Calcium: 9.8 mg/dL (ref 8.9–10.3)
Chloride: 107 mmol/L (ref 98–111)
Creatinine, Ser: 1.16 mg/dL — ABNORMAL HIGH (ref 0.44–1.00)
GFR, Estimated: 49 mL/min — ABNORMAL LOW (ref >60)
Glucose, Bld: 101 mg/dL — ABNORMAL HIGH (ref 70–99)
Potassium: 4.1 mmol/L (ref 3.5–5.1)
Sodium: 140 mmol/L (ref 135–145)
Total Bilirubin: 0.6 mg/dL (ref 0.3–1.2)
Total Protein: 6.4 g/dL — ABNORMAL LOW (ref 6.5–8.1)

## 2021-12-08 MED ORDER — DARATUMUMAB-HYALURONIDASE-FIHJ 1800-30000 MG-UT/15ML ~~LOC~~ SOLN
1800.0000 mg | Freq: Once | SUBCUTANEOUS | Status: AC
  Administered 2021-12-08: 1800 mg via SUBCUTANEOUS
  Filled 2021-12-08: qty 15

## 2021-12-08 MED ORDER — DEXAMETHASONE 4 MG PO TABS: 20.0000 mg | ORAL_TABLET | Freq: Once | ORAL | Status: DC

## 2021-12-08 MED ORDER — ACETAMINOPHEN 325 MG PO TABS
650.0000 mg | ORAL_TABLET | Freq: Once | ORAL | Status: AC
  Administered 2021-12-08: 650 mg via ORAL
  Filled 2021-12-08: qty 2

## 2021-12-08 MED ORDER — ONDANSETRON HCL 4 MG PO TABS
8.0000 mg | ORAL_TABLET | Freq: Once | ORAL | Status: AC
  Administered 2021-12-08: 8 mg via ORAL
  Filled 2021-12-08: qty 2

## 2021-12-08 MED ORDER — CYANOCOBALAMIN 1000 MCG/ML IJ SOLN
1000.0000 ug | Freq: Once | INTRAMUSCULAR | Status: AC
  Administered 2021-12-08: 1000 ug via INTRAMUSCULAR
  Filled 2021-12-08: qty 1

## 2021-12-08 MED ORDER — DIPHENHYDRAMINE HCL 25 MG PO CAPS
50.0000 mg | ORAL_CAPSULE | Freq: Once | ORAL | Status: AC
  Administered 2021-12-08: 50 mg via ORAL
  Filled 2021-12-08: qty 2

## 2021-12-08 NOTE — Progress Notes (Signed)
Hematology/Oncology Consult Note [facility]  Telephone:([phone] [phone]  Patient Care Team: Denton Lank, MD as PCP - General (Family Medicine) Sindy Guadeloupe, MD as Consulting Physician (Hematology and Oncology)   Name of the patient: Laurie [name]  [id number]  1945-11-24   Date of visit: 12/08/21  Diagnosis- stage III biclonal IgA multiple myeloma  Chief complaint/ Reason for visit-on treatment assessment prior to next cycle of Darzalex  Heme/Onc history: [name] who was diagnosed with stage III biclonal IgA multiple myeloma in June 2021. SPEP was 5.0 gm/dL on 10/03/2019. Random urine revealed 196.6 mg/dL total protein with 58.3% M-spike.  Bone marrow biopsy on 11/13/2019 revealed a hypercellular bone marrow with extensive involvement by plasma cell neoplasm. Atypical plasma cells represented 70% of all cells in the aspirate and was associated with prominent interstitial infiltrates and diffuse sheets in the clot.  Plasma cells were kappa light chain restricted.   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   Bone survey on 10/26/2019 revealed small lytic lesions in the skull. There was no acute fracture.   Patient has been on daratumumab Revlimid dexamethasone regimen since July 2021.  She briefly received Retacrit in the past but has not required it for over 1 year now.   Bisphosphonates had to be stopped after treatment was complicated by osteonecrosis of the jaw.  Patient also had 2 episodes of infection in that area and Revlimid has been on hold since March 2023.   Patient is a Sales promotion account executive Witness  Interval history-patient is 76 year old female who  returns to clinic for follow-up and consideration of Darzalex.  In the interim, she was seen by Dr. Shary Decamp with oral surgery for history of bisphosphonate associated osteonecrosis of the jaw.  CT previously showed chronic osteomyelitis as well as osteonecrosis involving the mandible of the right side. Tolerating treatment well and denies complaints.   ECOG PS- 1 Pain scale- 0  Review of systems- Review of Systems  Constitutional:  Negative for chills, fever, malaise/fatigue and weight loss.  HENT:  Negative for congestion, ear discharge and nosebleeds.   Eyes:  Negative for blurred vision.  Respiratory:  Negative for cough, hemoptysis, sputum production, shortness of breath and wheezing.   Cardiovascular:  Negative for chest pain, palpitations, orthopnea and claudication.  Gastrointestinal:  Negative for abdominal pain, blood in stool, constipation, diarrhea, heartburn, melena, nausea and vomiting.  Genitourinary:  Negative for dysuria, flank pain, frequency, hematuria and urgency.  Musculoskeletal:  Negative for back pain, joint pain and myalgias.  Skin:  Negative for rash.  Neurological:  Negative for dizziness, tingling, focal weakness, seizures, weakness and headaches.  Endo/Heme/Allergies:  Does not bruise/bleed easily.  Psychiatric/Behavioral:  Negative for depression and suicidal ideas. The patient does not have insomnia.       Allergies  Allergen Reactions   Levofloxacin     Critical    Other Other (See Comments)    Jehovah Witness (blood)  Antihistamines      Past Medical History:  Diagnosis Date   Anxiety    Asthma    Cellulitis and abscess of oral soft tissues    Cellulitis and abscess of oral soft tissues 03/31/2021   pt in hospital and now out as outpt  Depression    Head injury    Multiple myeloma ([place])    Osteoporosis      Past Surgical History:  Procedure Laterality Date   ABDOMINAL HYSTERECTOMY     HEMORROIDECTOMY     MOUTH SURGERY      SEPTOPLASTY      Social History   Socioeconomic History   Marital status: Widowed    Spouse name: Not on file   Number of children: Not on file   Years of education: Not on file   Highest education level: Not on file  Occupational History   Not on file  Tobacco Use   Smoking status: Never   Smokeless tobacco: Never  Vaping Use   Vaping Use: Never used  Substance and Sexual Activity   Alcohol use: No   Drug use: No   Sexual activity: Not Currently  Other Topics Concern   Not on file  Social History Narrative   Jehovah Witness    Social Determinants of Health   Financial Resource Strain: Not on file  Food Insecurity: Not on file  Transportation Needs: Not on file  Physical Activity: Not on file  Stress: Not on file  Social Connections: Not on file  Intimate Partner Violence: Not on file    Family History  Problem Relation Age of Onset   Prostate cancer Brother    Throat cancer Maternal Aunt    Breast cancer Cousin        mat cousin   Breast cancer Cousin    Thyroid cancer Other      Current Outpatient Medications:    Vitamin E 670 MG (1000 UT) CAPS, Take by mouth., Disp: , Rfl:    acyclovir (ZOVIRAX) 400 MG tablet, Take 1 tablet (400 mg total) by mouth 2 (two) times daily., Disp: 180 tablet, Rfl: 3   albuterol (VENTOLIN HFA) 108 (90 Base) MCG/ACT inhaler, 2 inhalations every 4 (four) hours as needed., Disp: , Rfl:    aspirin 81 MG chewable tablet, Chew by mouth daily., Disp: , Rfl:    atorvastatin (LIPITOR) 20 MG tablet, Take 20 mg by mouth daily., Disp: , Rfl:    busPIRone (BUSPAR) 10 MG tablet, Take 10 mg by mouth 2 (two) times daily., Disp: , Rfl:    Calcium Carb-Cholecalciferol (CALCIUM 600 + D PO), Take 600 mg by mouth 5 (five) times daily. Increase calcium from $RemoveBefo'600mg'PdHwIefqIZb$  4 times a day to $Remo'600mg'hqWyO$  5 times a day starting today, Disp: , Rfl:    chlorhexidine (PERIDEX) 0.12 % solution, 15 mLs 2 (two) times daily., Disp: , Rfl:    dexamethasone (DECADRON) 4 MG  tablet, Take 5 tablets (20 mg total) by mouth once a week. (Patient not taking: Reported on 11/10/2021), Disp: 120 tablet, Rfl: 2   docusate sodium (COLACE) 100 MG capsule, Take 100 mg by mouth 2 (two) times daily., Disp: , Rfl:    lenalidomide (REVLIMID) 15 MG capsule, Take 1 capsule (15 mg total) by mouth daily. Take for 21 days, then hold for 7 days. Repeat every 28 days.  Rems # [id number] on 09/15/2021, Disp: 21 capsule, Rfl: 0   montelukast (SINGULAIR) 10 MG tablet, Take 10 mg by mouth daily after breakfast., Disp: , Rfl:    naproxen (NAPROSYN) 500 MG tablet, Take 500 mg by mouth 2 (two) times daily with a meal. (Patient not taking: Reported on 11/10/2021), Disp: , Rfl:    nortriptyline (PAMELOR) 25 MG capsule, Take 25 mg by mouth at bedtime., Disp: , Rfl:  omeprazole (PRILOSEC) 20 MG capsule, Take 20 mg by mouth daily., Disp: , Rfl:    ondansetron (ZOFRAN) 8 MG tablet, Take 8 mg by mouth every 8 (eight) hours as needed. (Patient not taking: Reported on 11/10/2021), Disp: , Rfl:    pentoxifylline (TRENTAL) 400 MG CR tablet, Take 400 mg by mouth 2 (two) times daily., Disp: , Rfl:    sertraline (ZOLOFT) 100 MG tablet, Take 100 mg by mouth 2 (two) times daily., Disp: , Rfl:    traZODone (DESYREL) 50 MG tablet, Take 50 mg by mouth at bedtime., Disp: , Rfl:    Vitamin D, Ergocalciferol, (DRISDOL) 50000 UNITS CAPS capsule, Take 50,000 Units by mouth every 30 (thirty) days. Once a month, Disp: , Rfl:   Physical exam:  Vitals:   12/08/21 0800 12/08/21 0900  BP:  (!) 148/90  Pulse:  (!) 103  Resp:  14  Temp:  (!) 96.8 F (36 C)  TempSrc:  Tympanic  SpO2:  98%  Weight: 185 lb (83.9 kg)    Physical Exam HENT:     Mouth/Throat:     Comments: Osteonecrosis of the right mandible Cardiovascular:     Rate and Rhythm: Normal rate and regular rhythm.     Heart sounds: Normal heart sounds.  Pulmonary:     Effort: Pulmonary effort is normal.     Breath sounds: Normal breath sounds.  Abdominal:      General: Bowel sounds are normal.     Palpations: Abdomen is soft.  Skin:    General: Skin is warm and dry.  Neurological:     Mental Status: She is alert and oriented to person, place, and time.         Latest Ref Rng & Units 12/08/2021    8:32 AM  CMP  Glucose 70 - 99 mg/dL 101   BUN 8 - 23 mg/dL 23   Creatinine 0.44 - 1.00 mg/dL 1.16   Sodium 135 - 145 mmol/L 140   Potassium 3.5 - 5.1 mmol/L 4.1   Chloride 98 - 111 mmol/L 107   CO2 22 - 32 mmol/L 26   Calcium 8.9 - 10.3 mg/dL 9.8   Total Protein 6.5 - 8.1 g/dL 6.4   Total Bilirubin 0.3 - 1.2 mg/dL 0.6   Alkaline Phos 38 - 126 U/L 83   AST 15 - 41 U/L 17   ALT 0 - 44 U/L 15       Latest Ref Rng & Units 12/08/2021    8:32 AM  CBC  WBC 4.0 - 10.5 K/uL 9.5   Hemoglobin 12.0 - 15.0 g/dL 14.1   Hematocrit 36.0 - 46.0 % 41.8   Platelets 150 - 400 K/uL 280     No images are attached to the encounter.  No results found.   Assessment and plan- Patient is a 76 y.o. 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  She is here for on treatment assessment prior to next cycle of monthly Darzalex.   Labs reviewed and acceptable for continuation of treatment.  Proceed with cycle 24 of monthly Darzalex.   Cytopenia-chemotherapy-induced.  Counts remain stable and no significant cytopenias.  May need to hold Darzalex or delay surgery if patient experiences cytopenias.  However, we reviewed that with monthly dosing of Darzalex this is not typically a problem.  Recommend continue close monitoring of blood counts given anticipated surgery on 8/22.  Revlimid continues to be held.  Light chains and myeloma panel are pending at time  of visit.  July 2023 labs showed stable M protein which fluctuates from not being detected to 0.1 g and free light chain ratio has been stable.  Elevated GFR-GFR has dipped to 49.  Creatinine 1.16.  Recommend she increase fluid intake.  ONJ- secondary to fosamax x 20 years and xgeva x 2 years. Right  mandible.  Plan for debridement and possible open reduction internal fixation with oral surgery.  She has not yet had presurgical appointment but does have appointment for surgery on 8/22.   B12 today and monthly.  Disposition: Darzalex & b12 today 4 weeks- lab, Dr. Janese Banks, darzalex & b12- la   Visit Diagnosis 1. Encounter for antineoplastic immunotherapy   2. Multiple myeloma not having achieved remission ([place])   3. Osteonecrosis of jaw ([place])   4. B12 deficiency    Beckey Rutter, DNP, AGNP-C [facility] at [facility] [phone] (clinic) 12/08/2021

## 2021-12-08 NOTE — Progress Notes (Signed)
Returns for follow-up. No new concerns. Has upcoming jaw surgery on 8/22 at [facility].

## 2021-12-08 NOTE — Patient Instructions (Signed)
[facility]  Discharge Instructions: Thank you for choosing [facility] to provide your oncology and hematology care.  If you have a lab appointment with the Cancer Center, please go directly to the Cancer Center and check in at the registration area.  Wear comfortable clothing and clothing appropriate for easy access to any Portacath or PICC line.   We strive to give you quality time with your provider. You may need to reschedule your appointment if you arrive late (15 or more minutes).  Arriving late affects you and other patients whose appointments are after yours.  Also, if you miss three or more appointments without notifying the office, you may be dismissed from the clinic at the provider's discretion.      For prescription refill requests, have your pharmacy contact our office and allow 72 hours for refills to be completed.    Today you received the following chemotherapy and/or immunotherapy agents Darzalex      To help prevent nausea and vomiting after your treatment, we encourage you to take your nausea medication as directed.  BELOW ARE SYMPTOMS THAT SHOULD BE REPORTED IMMEDIATELY: *FEVER GREATER THAN 100.4 F (38 C) OR HIGHER *CHILLS OR SWEATING *NAUSEA AND VOMITING THAT IS NOT CONTROLLED WITH YOUR NAUSEA MEDICATION *UNUSUAL SHORTNESS OF BREATH *UNUSUAL BRUISING OR BLEEDING *URINARY PROBLEMS (pain or burning when urinating, or frequent urination) *BOWEL PROBLEMS (unusual diarrhea, constipation, pain near the anus) TENDERNESS IN MOUTH AND THROAT WITH OR WITHOUT PRESENCE OF ULCERS (sore throat, sores in mouth, or a toothache) UNUSUAL RASH, SWELLING OR PAIN  UNUSUAL VAGINAL DISCHARGE OR ITCHING   Items with * indicate a potential emergency and should be followed up as soon as possible or go to the Emergency Department if any problems should occur.  Please show the CHEMOTHERAPY ALERT CARD or IMMUNOTHERAPY ALERT CARD at check-in to  the Emergency Department and triage nurse.  Should you have questions after your visit or need to cancel or reschedule your appointment, please contact [facility]  [phone] and follow the prompts.  Office hours are 8:00 a.m. to 4:30 p.m. Monday - Friday. Please note that voicemails left after 4:00 p.m. may not be returned until the following business day.  We are closed weekends and major holidays. You have access to a nurse at all times for urgent questions. Please call the main number to the clinic [phone] and follow the prompts.  For any non-urgent questions, you may also contact your provider using MyChart. We now offer e-Visits for anyone 18 and older to request care online for non-urgent symptoms. For details visit [email or web address].   Also download the MyChart app! Go to the app store, search "MyChart", open the app, select [facility], and log in with your MyChart username and password.  Masks are optional in the cancer centers. If you would like for your care team to wear a mask while they are taking care of you, please let them know. For doctor visits, patients may have with them one support person who is at least 74 years old. At this time, visitors are not allowed in the infusion area.   

## 2021-12-09 ENCOUNTER — Other Ambulatory Visit: Payer: Self-pay

## 2021-12-09 LAB — KAPPA/LAMBDA LIGHT CHAINS
Kappa free light chain: 2 mg/L — ABNORMAL LOW (ref 3.3–19.4)
Kappa, lambda light chain ratio: 0.95 (ref 0.26–1.65)
Lambda free light chains: 2.1 mg/L — ABNORMAL LOW (ref 5.7–26.3)

## 2021-12-11 LAB — MULTIPLE MYELOMA PANEL, SERUM
Albumin SerPl Elph-Mcnc: 3.7 g/dL (ref 2.9–4.4)
Albumin/Glob SerPl: 1.9 — ABNORMAL HIGH (ref 0.7–1.7)
Alpha 1: 0.2 g/dL (ref 0.0–0.4)
Alpha2 Glob SerPl Elph-Mcnc: 0.7 g/dL (ref 0.4–1.0)
B-Globulin SerPl Elph-Mcnc: 0.8 g/dL (ref 0.7–1.3)
Gamma Glob SerPl Elph-Mcnc: 0.2 g/dL — ABNORMAL LOW (ref 0.4–1.8)
Globulin, Total: 2 g/dL — ABNORMAL LOW (ref 2.2–3.9)
IgA: 18 mg/dL — ABNORMAL LOW (ref 64–422)
IgG (Immunoglobin G), Serum: 222 mg/dL — ABNORMAL LOW (ref 586–1602)
IgM (Immunoglobulin M), Srm: 13 mg/dL — ABNORMAL LOW (ref 26–217)
M Protein SerPl Elph-Mcnc: 0.1 g/dL — ABNORMAL HIGH
Total Protein ELP: 5.7 g/dL — ABNORMAL LOW (ref 6.0–8.5)

## 2021-12-21 ENCOUNTER — Other Ambulatory Visit: Payer: Self-pay

## 2021-12-24 ENCOUNTER — Telehealth: Payer: Self-pay

## 2021-12-24 NOTE — Telephone Encounter (Signed)
If she was prescribed antibiotics she can take it. She is not allergic to penicillin

## 2021-12-24 NOTE — Telephone Encounter (Signed)
Pt understands and will take medication.

## 2021-12-24 NOTE — Telephone Encounter (Signed)
Pt reached clinical line and stated that she had oral surgery yesterday and everything went well. However, she is currently on antibiotics (amoxcillin) and stated it has magesium in it. Per Dr. Loletha Grayer pt was not to have any mag so pt is curious if antibiotics is safe to take. Please advise.

## 2022-01-06 ENCOUNTER — Inpatient Hospital Stay: Payer: Medicare HMO

## 2022-01-06 ENCOUNTER — Other Ambulatory Visit: Payer: Self-pay | Admitting: Oncology

## 2022-01-06 ENCOUNTER — Inpatient Hospital Stay (HOSPITAL_BASED_OUTPATIENT_CLINIC_OR_DEPARTMENT_OTHER): Payer: Medicare HMO | Admitting: Nurse Practitioner

## 2022-01-06 ENCOUNTER — Inpatient Hospital Stay: Payer: Medicare HMO | Attending: Oncology

## 2022-01-06 VITALS — BP 152/73 | HR 96 | Temp 97.2°F | Resp 16 | Wt 180.0 lb

## 2022-01-06 DIAGNOSIS — Z808 Family history of malignant neoplasm of other organs or systems: Secondary | ICD-10-CM | POA: Diagnosis not present

## 2022-01-06 DIAGNOSIS — Z5112 Encounter for antineoplastic immunotherapy: Secondary | ICD-10-CM | POA: Insufficient documentation

## 2022-01-06 DIAGNOSIS — Z79899 Other long term (current) drug therapy: Secondary | ICD-10-CM | POA: Insufficient documentation

## 2022-01-06 DIAGNOSIS — Z79624 Long term (current) use of inhibitors of nucleotide synthesis: Secondary | ICD-10-CM | POA: Diagnosis not present

## 2022-01-06 DIAGNOSIS — Z803 Family history of malignant neoplasm of breast: Secondary | ICD-10-CM | POA: Insufficient documentation

## 2022-01-06 DIAGNOSIS — E538 Deficiency of other specified B group vitamins: Secondary | ICD-10-CM | POA: Insufficient documentation

## 2022-01-06 DIAGNOSIS — Z8042 Family history of malignant neoplasm of prostate: Secondary | ICD-10-CM | POA: Insufficient documentation

## 2022-01-06 DIAGNOSIS — Z801 Family history of malignant neoplasm of trachea, bronchus and lung: Secondary | ICD-10-CM | POA: Insufficient documentation

## 2022-01-06 DIAGNOSIS — D649 Anemia, unspecified: Secondary | ICD-10-CM

## 2022-01-06 DIAGNOSIS — C9000 Multiple myeloma not having achieved remission: Secondary | ICD-10-CM

## 2022-01-06 DIAGNOSIS — M879 Osteonecrosis, unspecified: Secondary | ICD-10-CM

## 2022-01-06 DIAGNOSIS — Z7952 Long term (current) use of systemic steroids: Secondary | ICD-10-CM | POA: Diagnosis not present

## 2022-01-06 DIAGNOSIS — Z7982 Long term (current) use of aspirin: Secondary | ICD-10-CM | POA: Diagnosis not present

## 2022-01-06 LAB — COMPREHENSIVE METABOLIC PANEL
ALT: 11 U/L (ref 0–44)
AST: 17 U/L (ref 15–41)
Albumin: 4 g/dL (ref 3.5–5.0)
Alkaline Phosphatase: 74 U/L (ref 38–126)
Anion gap: 10 (ref 5–15)
BUN: 18 mg/dL (ref 8–23)
CO2: 25 mmol/L (ref 22–32)
Calcium: 9 mg/dL (ref 8.9–10.3)
Chloride: 103 mmol/L (ref 98–111)
Creatinine, Ser: 1.09 mg/dL — ABNORMAL HIGH (ref 0.44–1.00)
GFR, Estimated: 53 mL/min — ABNORMAL LOW (ref >60)
Glucose, Bld: 108 mg/dL — ABNORMAL HIGH (ref 70–99)
Potassium: 4 mmol/L (ref 3.5–5.1)
Sodium: 138 mmol/L (ref 135–145)
Total Bilirubin: 0.3 mg/dL (ref 0.3–1.2)
Total Protein: 6.4 g/dL — ABNORMAL LOW (ref 6.5–8.1)

## 2022-01-06 LAB — CBC WITH DIFFERENTIAL/PLATELET
Abs Immature Granulocytes: 0.03 10*3/uL (ref 0.00–0.07)
Basophils Absolute: 0.1 10*3/uL (ref 0.0–0.1)
Basophils Relative: 1 %
Eosinophils Absolute: 0.2 10*3/uL (ref 0.0–0.5)
Eosinophils Relative: 3 %
HCT: 40 % (ref 36.0–46.0)
Hemoglobin: 13.5 g/dL (ref 12.0–15.0)
Immature Granulocytes: 0 %
Lymphocytes Relative: 17 %
Lymphs Abs: 1.3 10*3/uL (ref 0.7–4.0)
MCH: 32.1 pg (ref 26.0–34.0)
MCHC: 33.8 g/dL (ref 30.0–36.0)
MCV: 95.2 fL (ref 80.0–100.0)
Monocytes Absolute: 0.5 10*3/uL (ref 0.1–1.0)
Monocytes Relative: 6 %
Neutro Abs: 5.8 10*3/uL (ref 1.7–7.7)
Neutrophils Relative %: 73 %
Platelets: 350 10*3/uL (ref 150–400)
RBC: 4.2 MIL/uL (ref 3.87–5.11)
RDW: 12 % (ref 11.5–15.5)
WBC: 7.9 10*3/uL (ref 4.0–10.5)
nRBC: 0 % (ref 0.0–0.2)

## 2022-01-06 MED ORDER — DARATUMUMAB-HYALURONIDASE-FIHJ 1800-30000 MG-UT/15ML ~~LOC~~ SOLN
1800.0000 mg | Freq: Once | SUBCUTANEOUS | Status: AC
  Administered 2022-01-06: 1800 mg via SUBCUTANEOUS
  Filled 2022-01-06: qty 15

## 2022-01-06 MED ORDER — DIPHENHYDRAMINE HCL 25 MG PO CAPS
50.0000 mg | ORAL_CAPSULE | Freq: Once | ORAL | Status: AC
  Administered 2022-01-06: 50 mg via ORAL
  Filled 2022-01-06: qty 2

## 2022-01-06 MED ORDER — CYANOCOBALAMIN 1000 MCG/ML IJ SOLN
1000.0000 ug | Freq: Once | INTRAMUSCULAR | Status: AC
  Administered 2022-01-06: 1000 ug via INTRAMUSCULAR
  Filled 2022-01-06: qty 1

## 2022-01-06 MED ORDER — DEXAMETHASONE 4 MG PO TABS: 20.0000 mg | ORAL_TABLET | Freq: Once | ORAL | Status: DC

## 2022-01-06 NOTE — Progress Notes (Signed)
ON PATHWAY REGIMEN - Multiple Myeloma and Other Plasma Cell Dyscrasias  No Change  Continue With Treatment as Ordered.  Original Decision Date/Time: 11/23/2019 16:53     Cycles 1 and 2: A cycle is every 28 days:     Lenalidomide      Dexamethasone      Daratumumab and hyaluronidase-fihj    Cycles 3 through 6: A cycle is every 28 days:     Lenalidomide      Dexamethasone      Daratumumab and hyaluronidase-fihj    Cycles 7 and beyond: A cycle is every 28 days:     Lenalidomide      Dexamethasone      Daratumumab and hyaluronidase-fihj   **Always confirm dose/schedule in your pharmacy ordering system**  Patient Characteristics: Multiple Myeloma, Newly Diagnosed, Transplant Ineligible or Refused, High Risk Disease Classification: Multiple Myeloma R-ISS Staging: III Therapeutic Status: Newly Diagnosed Is Patient Eligible for Transplant<= Transplant Ineligible or Refused Risk Status: High Risk Intent of Therapy: Non-Curative / Palliative Intent, Discussed with Patient

## 2022-01-06 NOTE — Progress Notes (Signed)
Pt returns for follow-up. Had jaw surgery at [facility] on 8/22. States she is doing well and has had very little pain.

## 2022-01-06 NOTE — Progress Notes (Signed)
Hematology/Oncology Consult Note [facility]  Telephone:([phone] [phone]  Patient Care Team: Denton Lank, MD as PCP - General (Family Medicine) Sindy Guadeloupe, MD as Consulting Physician (Hematology and Oncology)   Name of the patient: Laurie [name]  [id number]  1946-01-15   Date of visit: 01/06/22  Diagnosis- stage III biclonal IgA multiple myeloma  Chief complaint/ Reason for visit-on treatment assessment prior to next cycle of Darzalex  Heme/Onc history: [name] who was diagnosed with stage III biclonal IgA multiple myeloma in June 2021. SPEP was 5.0 gm/dL on 10/03/2019. Random urine revealed 196.6 mg/dL total protein with 58.3% M-spike.  Bone marrow biopsy on 11/13/2019 revealed a hypercellular bone marrow with extensive involvement by plasma cell neoplasm. Atypical plasma cells represented 70% of all cells in the aspirate and was associated with prominent interstitial infiltrates and diffuse sheets in the clot.  Plasma cells were kappa light chain restricted.   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   Bone survey on 10/26/2019 revealed small lytic lesions in the skull. There was no acute fracture.   Patient has been on daratumumab Revlimid dexamethasone regimen since July 2021.  She briefly received Retacrit in the past but has not required it for over 1 year now.   Bisphosphonates had to be stopped after treatment was complicated by osteonecrosis of the jaw.  Patient also had 2 episodes of infection in that area and Revlimid has been on hold since March 2023.   Patient is a Sales promotion account executive Witness  Interval history-patient is 76 year old female who  returns to clinic for follow-up and consideration of Darzalex. She has history of bisphosphonate associated osteonecrosis of the jaw. CT Previously showed chronic osteomyelitis as well as osteonecrosis involving the mandible of the right side. Continues to tolerate darzalex well without significant side effects. She has now had surgery for ONJ and recovering well. Drinking liquid diet. Mild pain but mostly when talking, singing. Has finished antibiotics. Took oral steroids this morning but has been holding it otherwise. Revlimid has been held. No new bone pain.   ECOG PS- 1 Pain scale- 0  Review of systems- Review of Systems  Constitutional:  Negative for chills, fever, malaise/fatigue and weight loss.  HENT:  Negative for congestion, ear discharge and nosebleeds.   Eyes:  Negative for blurred vision.  Respiratory:  Negative for cough, hemoptysis, sputum production, shortness of breath and wheezing.   Cardiovascular:  Negative for chest pain, palpitations, orthopnea and claudication.  Gastrointestinal:  Negative for abdominal pain, blood in stool, constipation, diarrhea, heartburn, melena, nausea and vomiting.  Genitourinary:  Negative for dysuria, flank pain, frequency, hematuria and urgency.  Musculoskeletal:  Negative for back pain, joint pain and myalgias.  Skin:  Negative for rash.  Neurological:  Negative for dizziness, tingling, focal weakness, seizures, weakness and headaches.  Endo/Heme/Allergies:  Does not bruise/bleed easily.  Psychiatric/Behavioral:  Negative for depression and suicidal ideas. The patient does not have insomnia.      Allergies  Allergen Reactions   Levofloxacin     Critical    Other Other (See Comments)    Jehovah Witness (blood)  Antihistamines     Past Medical History:  Diagnosis Date   Anxiety    Asthma  Cellulitis and abscess of oral soft tissues    Cellulitis and abscess of oral soft tissues 03/31/2021   pt in hospital and now out as outpt    Depression    Head injury    Multiple myeloma ([place])    Osteoporosis     Past Surgical History:  Procedure Laterality Date   ABDOMINAL HYSTERECTOMY     HEMORROIDECTOMY     MOUTH SURGERY     SEPTOPLASTY      Social History   Socioeconomic History   Marital status: Widowed    Spouse name: Not on file   Number of children: Not on file   Years of education: Not on file   Highest education level: Not on file  Occupational History   Not on file  Tobacco Use   Smoking status: Never   Smokeless tobacco: Never  Vaping Use   Vaping Use: Never used  Substance and Sexual Activity   Alcohol use: No   Drug use: No   Sexual activity: Not Currently  Other Topics Concern   Not on file  Social History Narrative   Jehovah Witness    Social Determinants of Health   Financial Resource Strain: Not on file  Food Insecurity: Not on file  Transportation Needs: Not on file  Physical Activity: Not on file  Stress: Not on file  Social Connections: Not on file  Intimate Partner Violence: Not on file    Family History  Problem Relation Age of Onset   Prostate cancer Brother    Throat cancer Maternal Aunt    Breast cancer Cousin        mat cousin   Breast cancer Cousin    Thyroid cancer Other     Current Outpatient Medications:    pentoxifylline (TRENTAL) 400 MG CR tablet, Take by mouth., Disp: , Rfl:    acyclovir (ZOVIRAX) 400 MG tablet, Take 1 tablet (400 mg total) by mouth 2 (two) times daily., Disp: 180 tablet, Rfl: 3   albuterol (VENTOLIN HFA) 108 (90 Base) MCG/ACT inhaler, 2 inhalations every 4 (four) hours as needed., Disp: , Rfl:    aspirin 81 MG chewable tablet, Chew by mouth daily., Disp: , Rfl:    atorvastatin (LIPITOR) 20 MG tablet, Take 20 mg by mouth daily., Disp: , Rfl:    busPIRone (BUSPAR) 10 MG tablet, Take 10 mg by mouth 2 (two) times daily., Disp: , Rfl:    Calcium Carb-Cholecalciferol (CALCIUM 600 + D PO), Take 600 mg by mouth 5 (five) times daily. Increase  calcium from $RemoveBefo'600mg'GqHuExNMZbO$  4 times a day to $Remo'600mg'vmDlO$  5 times a day starting today, Disp: , Rfl:    chlorhexidine (PERIDEX) 0.12 % solution, 15 mLs 2 (two) times daily., Disp: , Rfl:    dexamethasone (DECADRON) 4 MG tablet, Take 5 tablets (20 mg total) by mouth once a week. (Patient not taking: Reported on 11/10/2021), Disp: 120 tablet, Rfl: 2   docusate sodium (COLACE) 100 MG capsule, Take 100 mg by mouth 2 (two) times daily., Disp: , Rfl:    lenalidomide (REVLIMID) 15 MG capsule, Take 1 capsule (15 mg total) by mouth daily. Take for 21 days, then hold for 7 days. Repeat every 28 days.  Rems # [id number] on 09/15/2021, Disp: 21 capsule, Rfl: 0   montelukast (SINGULAIR) 10 MG tablet, Take 10 mg by mouth daily after breakfast., Disp: , Rfl:    naproxen (NAPROSYN) 500 MG tablet, Take 500 mg by mouth 2 (two) times daily with a meal. (Patient  not taking: Reported on 11/10/2021), Disp: , Rfl:    nortriptyline (PAMELOR) 25 MG capsule, Take 25 mg by mouth at bedtime., Disp: , Rfl:    omeprazole (PRILOSEC) 20 MG capsule, Take 20 mg by mouth daily., Disp: , Rfl:    ondansetron (ZOFRAN) 8 MG tablet, Take 8 mg by mouth every 8 (eight) hours as needed. (Patient not taking: Reported on 11/10/2021), Disp: , Rfl:    pentoxifylline (TRENTAL) 400 MG CR tablet, Take 400 mg by mouth 2 (two) times daily., Disp: , Rfl:    sertraline (ZOLOFT) 100 MG tablet, Take 100 mg by mouth 2 (two) times daily., Disp: , Rfl:    traZODone (DESYREL) 50 MG tablet, Take 50 mg by mouth at bedtime., Disp: , Rfl:    Vitamin D, Ergocalciferol, (DRISDOL) 50000 UNITS CAPS capsule, Take 50,000 Units by mouth every 30 (thirty) days. Once a month, Disp: , Rfl:    Vitamin E 670 MG (1000 UT) CAPS, Take by mouth., Disp: , Rfl:   Physical exam:  Vitals:   01/06/22 0854 01/06/22 0859  BP:  (!) 152/73  Pulse:  96  Resp:  16  Temp:  (!) 97.2 F (36.2 C)  TempSrc:  Tympanic  SpO2:  97%  Weight: 180 lb (81.6 kg)    Physical Exam Constitutional:       Appearance: She is not ill-appearing.  HENT:     Mouth/Throat:     Comments: Osteonecrosis of the right mandible-s/p ORIF and debridement. Sutures visible. Tongue coated but no redness or pain.  Cardiovascular:     Rate and Rhythm: Normal rate and regular rhythm.     Heart sounds: Normal heart sounds.  Pulmonary:     Effort: Pulmonary effort is normal.     Breath sounds: Normal breath sounds.  Abdominal:     General: There is no distension.     Palpations: Abdomen is soft.  Skin:    General: Skin is warm and dry.     Coloration: Skin is not pale.     Findings: No rash.  Neurological:     Mental Status: She is alert and oriented to person, place, and time.  Psychiatric:        Mood and Affect: Mood normal.        Behavior: Behavior normal.         Latest Ref Rng & Units 01/06/2022    8:26 AM  CMP  Glucose 70 - 99 mg/dL 108   BUN 8 - 23 mg/dL 18   Creatinine 0.44 - 1.00 mg/dL 1.09   Sodium 135 - 145 mmol/L 138   Potassium 3.5 - 5.1 mmol/L 4.0   Chloride 98 - 111 mmol/L 103   CO2 22 - 32 mmol/L 25   Calcium 8.9 - 10.3 mg/dL 9.0   Total Protein 6.5 - 8.1 g/dL 6.4   Total Bilirubin 0.3 - 1.2 mg/dL 0.3   Alkaline Phos 38 - 126 U/L 74   AST 15 - 41 U/L 17   ALT 0 - 44 U/L 11       Latest Ref Rng & Units 01/06/2022    8:26 AM  CBC  WBC 4.0 - 10.5 K/uL 7.9   Hemoglobin 12.0 - 15.0 g/dL 13.5   Hematocrit 36.0 - 46.0 % 40.0   Platelets 150 - 400 K/uL 350     No images are attached to the encounter.  No results found.   Assessment and plan- Patient is a 76 y.o. female with  stage III biclonal IgA kappa multiple myeloma.  She is here for on treatment assessment prior to next cycle of monthly Darzalex.   Labs reviewed and acceptable for continuation of treatment.  Proceed with cycle 25 of monthly Darzalex. Tolerating well. August 2023 labs showed stable M protein which fluctuates from not being detected to 0.1 g and free light chain ratio has been stable. Today's results are  pending at time of visit but she remains asymptomatic. She has now had oral surgery- see below.Given ongoing healing, continue to hold revlimid and decadron. This was reviewed by Dr. Janese Banks who recommended. She will see patient for next cycle and will consider restarting.   Cytopenia-chemotherapy-induced.  Counts remain stable and no significant cytopenias. Revlimid continues to be held which is likely culpable more so than darzalex.   Elevated GFR-GFR had dipped to 49, now improved to 53. Continue hydrating well though limited post operatively. Monitor closely in setting of MM.   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 She is s/p debridement and ORIF with oral surgery on 8/22. Continues to heal. Now completed amoxicillin. No evidence of infection. Some tongue coating but no pain. Encouraged her to discuss with oral surgery.    B12 - proceed with today and monthly.  Disposition: Darzalex & b12 today. Hold revlimid and decadron.  4 weeks- lab, Dr. Janese Banks, darzalex, decadron, & b12- la   Visit Diagnosis 1. Multiple myeloma not having achieved remission ([place])    Beckey Rutter, [place], AGNP-C [place] at [facility] [phone] (clinic) 01/06/2022

## 2022-01-07 ENCOUNTER — Other Ambulatory Visit: Payer: Self-pay

## 2022-01-07 LAB — KAPPA/LAMBDA LIGHT CHAINS
Kappa free light chain: 2.1 mg/L — ABNORMAL LOW (ref 3.3–19.4)
Kappa, lambda light chain ratio: 0.95 (ref 0.26–1.65)
Lambda free light chains: 2.2 mg/L — ABNORMAL LOW (ref 5.7–26.3)

## 2022-01-12 LAB — MULTIPLE MYELOMA PANEL, SERUM
Albumin SerPl Elph-Mcnc: 3.6 g/dL (ref 2.9–4.4)
Albumin/Glob SerPl: 1.9 — ABNORMAL HIGH (ref 0.7–1.7)
Alpha 1: 0.2 g/dL (ref 0.0–0.4)
Alpha2 Glob SerPl Elph-Mcnc: 0.7 g/dL (ref 0.4–1.0)
B-Globulin SerPl Elph-Mcnc: 0.8 g/dL (ref 0.7–1.3)
Gamma Glob SerPl Elph-Mcnc: 0.1 g/dL — ABNORMAL LOW (ref 0.4–1.8)
Globulin, Total: 1.9 g/dL — ABNORMAL LOW (ref 2.2–3.9)
IgA: 18 mg/dL — ABNORMAL LOW (ref 64–422)
IgG (Immunoglobin G), Serum: 222 mg/dL — ABNORMAL LOW (ref 586–1602)
IgM (Immunoglobulin M), Srm: 12 mg/dL — ABNORMAL LOW (ref 26–217)
Total Protein ELP: 5.5 g/dL — ABNORMAL LOW (ref 6.0–8.5)

## 2022-01-16 ENCOUNTER — Other Ambulatory Visit: Payer: Self-pay

## 2022-01-20 ENCOUNTER — Other Ambulatory Visit: Payer: Self-pay

## 2022-02-02 ENCOUNTER — Other Ambulatory Visit: Payer: Self-pay

## 2022-02-02 DIAGNOSIS — C9000 Multiple myeloma not having achieved remission: Secondary | ICD-10-CM

## 2022-02-03 ENCOUNTER — Telehealth: Payer: Self-pay | Admitting: *Deleted

## 2022-02-03 ENCOUNTER — Encounter: Payer: Self-pay | Admitting: Oncology

## 2022-02-03 ENCOUNTER — Inpatient Hospital Stay: Payer: Medicare HMO

## 2022-02-03 ENCOUNTER — Inpatient Hospital Stay (HOSPITAL_BASED_OUTPATIENT_CLINIC_OR_DEPARTMENT_OTHER): Payer: Medicare HMO | Admitting: Oncology

## 2022-02-03 ENCOUNTER — Inpatient Hospital Stay: Payer: Medicare HMO | Attending: Oncology

## 2022-02-03 VITALS — BP 135/71 | HR 102 | Temp 97.2°F | Resp 16 | Wt 181.1 lb

## 2022-02-03 DIAGNOSIS — Z7952 Long term (current) use of systemic steroids: Secondary | ICD-10-CM | POA: Diagnosis not present

## 2022-02-03 DIAGNOSIS — Z79899 Other long term (current) drug therapy: Secondary | ICD-10-CM | POA: Insufficient documentation

## 2022-02-03 DIAGNOSIS — C9000 Multiple myeloma not having achieved remission: Secondary | ICD-10-CM

## 2022-02-03 DIAGNOSIS — D649 Anemia, unspecified: Secondary | ICD-10-CM

## 2022-02-03 DIAGNOSIS — Z7982 Long term (current) use of aspirin: Secondary | ICD-10-CM | POA: Insufficient documentation

## 2022-02-03 DIAGNOSIS — Z5111 Encounter for antineoplastic chemotherapy: Secondary | ICD-10-CM

## 2022-02-03 DIAGNOSIS — E538 Deficiency of other specified B group vitamins: Secondary | ICD-10-CM | POA: Diagnosis present

## 2022-02-03 DIAGNOSIS — Z5112 Encounter for antineoplastic immunotherapy: Secondary | ICD-10-CM | POA: Diagnosis present

## 2022-02-03 DIAGNOSIS — Z79624 Long term (current) use of inhibitors of nucleotide synthesis: Secondary | ICD-10-CM | POA: Diagnosis not present

## 2022-02-03 LAB — COMPREHENSIVE METABOLIC PANEL
ALT: 12 U/L (ref 0–44)
AST: 15 U/L (ref 15–41)
Albumin: 4.2 g/dL (ref 3.5–5.0)
Alkaline Phosphatase: 78 U/L (ref 38–126)
Anion gap: 8 (ref 5–15)
BUN: 21 mg/dL (ref 8–23)
CO2: 28 mmol/L (ref 22–32)
Calcium: 9.6 mg/dL (ref 8.9–10.3)
Chloride: 102 mmol/L (ref 98–111)
Creatinine, Ser: 1.03 mg/dL — ABNORMAL HIGH (ref 0.44–1.00)
GFR, Estimated: 57 mL/min — ABNORMAL LOW (ref >60)
Glucose, Bld: 108 mg/dL — ABNORMAL HIGH (ref 70–99)
Potassium: 4.4 mmol/L (ref 3.5–5.1)
Sodium: 138 mmol/L (ref 135–145)
Total Bilirubin: 0.4 mg/dL (ref 0.3–1.2)
Total Protein: 6.7 g/dL (ref 6.5–8.1)

## 2022-02-03 LAB — CBC WITH DIFFERENTIAL/PLATELET
Abs Immature Granulocytes: 0.03 10*3/uL (ref 0.00–0.07)
Basophils Absolute: 0.1 10*3/uL (ref 0.0–0.1)
Basophils Relative: 1 %
Eosinophils Absolute: 0.1 10*3/uL (ref 0.0–0.5)
Eosinophils Relative: 1 %
HCT: 44.2 % (ref 36.0–46.0)
Hemoglobin: 14.7 g/dL (ref 12.0–15.0)
Immature Granulocytes: 0 %
Lymphocytes Relative: 4 %
Lymphs Abs: 0.4 10*3/uL — ABNORMAL LOW (ref 0.7–4.0)
MCH: 30.8 pg (ref 26.0–34.0)
MCHC: 33.3 g/dL (ref 30.0–36.0)
MCV: 92.5 fL (ref 80.0–100.0)
Monocytes Absolute: 0.3 10*3/uL (ref 0.1–1.0)
Monocytes Relative: 3 %
Neutro Abs: 8.8 10*3/uL — ABNORMAL HIGH (ref 1.7–7.7)
Neutrophils Relative %: 91 %
Platelets: 296 10*3/uL (ref 150–400)
RBC: 4.78 MIL/uL (ref 3.87–5.11)
RDW: 12 % (ref 11.5–15.5)
WBC: 9.7 10*3/uL (ref 4.0–10.5)
nRBC: 0 % (ref 0.0–0.2)

## 2022-02-03 MED ORDER — DEXAMETHASONE 4 MG PO TABS: 20.0000 mg | ORAL_TABLET | Freq: Once | ORAL | Status: DC

## 2022-02-03 MED ORDER — DARATUMUMAB-HYALURONIDASE-FIHJ 1800-30000 MG-UT/15ML ~~LOC~~ SOLN
1800.0000 mg | Freq: Once | SUBCUTANEOUS | Status: AC
  Administered 2022-02-03: 1800 mg via SUBCUTANEOUS
  Filled 2022-02-03: qty 15

## 2022-02-03 MED ORDER — CYANOCOBALAMIN 1000 MCG/ML IJ SOLN
1000.0000 ug | Freq: Once | INTRAMUSCULAR | Status: AC
  Administered 2022-02-03: 1000 ug via INTRAMUSCULAR
  Filled 2022-02-03: qty 1

## 2022-02-03 MED ORDER — DIPHENHYDRAMINE HCL 25 MG PO CAPS: 50.0000 mg | ORAL_CAPSULE | Freq: Once | ORAL | Status: DC

## 2022-02-03 NOTE — Progress Notes (Signed)
Hematology/Oncology Consult note [facility]  [phone] [phone]  Patient Care Team: Denton Lank, MD as PCP - General (Family Medicine) Sindy Guadeloupe, MD as Consulting Physician (Hematology and Oncology)   Name of the patient: Laurie [name]  [id number]  November 11, 1945   Date of visit: 02/03/22  Diagnosis- stage III biclonal IgA multiple myeloma    Chief complaint/ Reason for visit-on treatment assessment prior to next cycle of Darzalex  Heme/Onc history: [name] who was diagnosed with stage III biclonal IgA multiple myeloma in June 2021. SPEP was 5.0 gm/dL on 10/03/2019. Random urine revealed 196.6 mg/dL total protein with 58.3% M-spike.  Bone marrow biopsy on 11/13/2019 revealed a hypercellular bone marrow with extensive involvement by plasma cell neoplasm. Atypical plasma cells represented 70% of all cells in the aspirate and was associated with prominent interstitial infiltrates and diffuse sheets in the clot.  Plasma cells were kappa light chain restricted.   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   Bone survey on 10/26/2019 revealed small lytic lesions in the skull. There was no acute fracture.   Patient has been on daratumumab Revlimid dexamethasone regimen since July 2021.  She briefly received Retacrit in the past but has not required it for over 1 year now.   Bisphosphonates had to be stopped after treatment was complicated by osteonecrosis of the jaw.  Patient also had 2 episodes of infection in that area and Revlimid has been on hold since March 2023.   Patient is a Sales promotion account executive Witness  Interval history-patient is gradually healing  after her surgery for osteonecrosis of the jaw.  The bone defect is now closed up.  She is eating better.  ECOG PS- 1 Pain scale- 0   Review of systems- Review of Systems  Constitutional:  Negative for chills, fever, malaise/fatigue and weight loss.  HENT:  Negative for congestion, ear discharge and nosebleeds.   Eyes:  Negative for blurred vision.  Respiratory:  Negative for cough, hemoptysis, sputum production, shortness of breath and wheezing.   Cardiovascular:  Negative for chest pain, palpitations, orthopnea and claudication.  Gastrointestinal:  Negative for abdominal pain, blood in stool, constipation, diarrhea, heartburn, melena, nausea and vomiting.  Genitourinary:  Negative for dysuria, flank pain, frequency, hematuria and urgency.  Musculoskeletal:  Negative for back pain, joint pain and myalgias.  Skin:  Negative for rash.  Neurological:  Negative for dizziness, tingling, focal weakness, seizures, weakness and headaches.  Endo/Heme/Allergies:  Does not bruise/bleed easily.  Psychiatric/Behavioral:  Negative for depression and suicidal ideas. The patient does not have insomnia.       Allergies  Allergen Reactions   Levofloxacin     Critical    Other Other (See Comments)    Jehovah Witness (blood)  Antihistamines      Past Medical History:  Diagnosis Date   Anxiety    Asthma    Cellulitis and abscess of oral soft tissues    Cellulitis and abscess of oral soft tissues 03/31/2021   pt in hospital and now out as outpt   Depression    Head injury    Multiple myeloma ([place])    Osteoporosis      Past Surgical History:  Procedure Laterality Date  ABDOMINAL HYSTERECTOMY     HEMORROIDECTOMY     MOUTH SURGERY     SEPTOPLASTY      Social History   Socioeconomic History   Marital status: Widowed    Spouse name: Not on file   Number of children: Not on file   Years of education: Not on file   Highest education level: Not on file  Occupational History   Not  on file  Tobacco Use   Smoking status: Never   Smokeless tobacco: Never  Vaping Use   Vaping Use: Never used  Substance and Sexual Activity   Alcohol use: No   Drug use: No   Sexual activity: Not Currently  Other Topics Concern   Not on file  Social History Narrative   Jehovah Witness    Social Determinants of Health   Financial Resource Strain: Not on file  Food Insecurity: Not on file  Transportation Needs: Not on file  Physical Activity: Not on file  Stress: Not on file  Social Connections: Not on file  Intimate Partner Violence: Not on file    Family History  Problem Relation Age of Onset   Prostate cancer Brother    Throat cancer Maternal Aunt    Breast cancer Cousin        mat cousin   Breast cancer Cousin    Thyroid cancer Other      Current Outpatient Medications:    acyclovir (ZOVIRAX) 400 MG tablet, Take 1 tablet (400 mg total) by mouth 2 (two) times daily., Disp: 180 tablet, Rfl: 3   aspirin 81 MG chewable tablet, Chew by mouth daily., Disp: , Rfl:    atorvastatin (LIPITOR) 20 MG tablet, Take 20 mg by mouth daily., Disp: , Rfl:    busPIRone (BUSPAR) 10 MG tablet, Take 10 mg by mouth 2 (two) times daily., Disp: , Rfl:    Calcium Carb-Cholecalciferol (CALCIUM 600 + D PO), Take 600 mg by mouth 5 (five) times daily. Increase calcium from 668m 4 times a day to 6030m5 times a day starting today, Disp: , Rfl:    chlorhexidine (PERIDEX) 0.12 % solution, 15 mLs 2 (two) times daily., Disp: , Rfl:    docusate sodium (COLACE) 100 MG capsule, Take 100 mg by mouth 2 (two) times daily., Disp: , Rfl:    montelukast (SINGULAIR) 10 MG tablet, Take 10 mg by mouth daily after breakfast., Disp: , Rfl:    nortriptyline (PAMELOR) 25 MG capsule, Take 25 mg by mouth at bedtime., Disp: , Rfl:    omeprazole (PRILOSEC) 20 MG capsule, Take 20 mg by mouth daily., Disp: , Rfl:    pentoxifylline (TRENTAL) 400 MG CR tablet, Take 400 mg by mouth 2 (two) times daily., Disp: , Rfl:     sertraline (ZOLOFT) 100 MG tablet, Take 100 mg by mouth 2 (two) times daily., Disp: , Rfl:    traZODone (DESYREL) 50 MG tablet, Take 50 mg by mouth at bedtime., Disp: , Rfl:    Vitamin D, Ergocalciferol, (DRISDOL) 50000 UNITS CAPS capsule, Take 50,000 Units by mouth every 30 (thirty) days. Once a month, Disp: , Rfl:    Vitamin E 670 MG (1000 UT) CAPS, Take by mouth., Disp: , Rfl:    albuterol (VENTOLIN HFA) 108 (90 Base) MCG/ACT inhaler, 2 inhalations every 4 (four) hours as needed., Disp: , Rfl:    dexamethasone (DECADRON) 4 MG tablet, Take 5 tablets (20 mg total) by mouth once a week. (Patient not taking: Reported on 11/10/2021), Disp: 120  tablet, Rfl: 2   lenalidomide (REVLIMID) 15 MG capsule, Take 1 capsule (15 mg total) by mouth daily. Take for 21 days, then hold for 7 days. Repeat every 28 days.  Rems # [id number] on 09/15/2021 (Patient not taking: Reported on 02/03/2022), Disp: 21 capsule, Rfl: 0   naproxen (NAPROSYN) 500 MG tablet, Take 500 mg by mouth 2 (two) times daily with a meal. (Patient not taking: Reported on 11/10/2021), Disp: , Rfl:    ondansetron (ZOFRAN) 8 MG tablet, Take 8 mg by mouth every 8 (eight) hours as needed. (Patient not taking: Reported on 11/10/2021), Disp: , Rfl:  No current facility-administered medications for this visit.  Facility-Administered Medications Ordered in Other Visits:    dexamethasone (DECADRON) tablet 20 mg, 20 mg, Oral, Once, Sindy Guadeloupe, MD   diphenhydrAMINE (BENADRYL) capsule 50 mg, 50 mg, Oral, Once, Sindy Guadeloupe, MD  Physical exam:  Vitals:   02/03/22 0939 02/03/22 1009  BP: 135/71   Pulse: (!) 102   Resp: 16   Temp:  (!) 97.2 F (36.2 C)  SpO2: 97%   Weight: 181 lb 1.6 oz (82.1 kg)    Physical Exam Constitutional:      General: She is not in acute distress. Cardiovascular:     Rate and Rhythm: Normal rate and regular rhythm.     Heart sounds: Normal heart sounds.  Pulmonary:     Effort: Pulmonary effort is normal.     Breath  sounds: Normal breath sounds.  Abdominal:     General: Bowel sounds are normal.     Palpations: Abdomen is soft.  Skin:    General: Skin is warm and dry.  Neurological:     Mental Status: She is alert and oriented to person, place, and time.         Latest Ref Rng & Units 02/03/2022    9:18 AM  CMP  Glucose 70 - 99 mg/dL 108   BUN 8 - 23 mg/dL 21   Creatinine 0.44 - 1.00 mg/dL 1.03   Sodium 135 - 145 mmol/L 138   Potassium 3.5 - 5.1 mmol/L 4.4   Chloride 98 - 111 mmol/L 102   CO2 22 - 32 mmol/L 28   Calcium 8.9 - 10.3 mg/dL 9.6   Total Protein 6.5 - 8.1 g/dL 6.7   Total Bilirubin 0.3 - 1.2 mg/dL 0.4   Alkaline Phos 38 - 126 U/L 78   AST 15 - 41 U/L 15   ALT 0 - 44 U/L 12       Latest Ref Rng & Units 02/03/2022    9:18 AM  CBC  WBC 4.0 - 10.5 K/uL 9.7   Hemoglobin 12.0 - 15.0 g/dL 14.7   Hematocrit 36.0 - 46.0 % 44.2   Platelets 150 - 400 K/uL 296      Assessment and plan- Patient is a 76 y.o. 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  She is here for on treatment assessment prior to next cycle of monthly Darzalex  Counts okay to proceed with cycle 26 of Darzalex today.  I am continuing to hold Revlimid for another month and based on her oral surgery assessment/wound healing I will consider restarting it in November.  I will see her back in 4 weeks for cycle 26.  B12 deficiency: Continue monthly B12 injections   Visit Diagnosis 1. Multiple myeloma not having achieved remission ([place])   2. Encounter for antineoplastic chemotherapy      Dr. Astrid Divine  Janese Banks, MD, MPH [place] at [facility] [phone] 02/03/2022 12:48 PM

## 2022-02-03 NOTE — Telephone Encounter (Signed)
Per Dr. Janese Banks she will hold her revlimid this cycle. Pt aware of this

## 2022-02-04 ENCOUNTER — Other Ambulatory Visit: Payer: Self-pay

## 2022-02-05 ENCOUNTER — Other Ambulatory Visit: Payer: Self-pay

## 2022-02-10 ENCOUNTER — Telehealth: Payer: Self-pay | Admitting: *Deleted

## 2022-02-10 NOTE — Telephone Encounter (Signed)
The patient called and said that she can start back on xgeva when she comes next.her next appt is exactly 4 weeks from dentist  visit.

## 2022-03-03 ENCOUNTER — Inpatient Hospital Stay (HOSPITAL_BASED_OUTPATIENT_CLINIC_OR_DEPARTMENT_OTHER): Payer: Medicare HMO | Admitting: Oncology

## 2022-03-03 ENCOUNTER — Encounter: Payer: Self-pay | Admitting: Oncology

## 2022-03-03 ENCOUNTER — Inpatient Hospital Stay: Payer: Medicare HMO

## 2022-03-03 VITALS — BP 137/68 | HR 105 | Temp 96.9°F | Resp 16 | Wt 184.2 lb

## 2022-03-03 DIAGNOSIS — Z5112 Encounter for antineoplastic immunotherapy: Secondary | ICD-10-CM | POA: Diagnosis not present

## 2022-03-03 DIAGNOSIS — C9000 Multiple myeloma not having achieved remission: Secondary | ICD-10-CM

## 2022-03-03 DIAGNOSIS — M272 Inflammatory conditions of jaws: Secondary | ICD-10-CM | POA: Diagnosis not present

## 2022-03-03 LAB — CBC WITH DIFFERENTIAL/PLATELET
Abs Immature Granulocytes: 0.04 10*3/uL (ref 0.00–0.07)
Basophils Absolute: 0.1 10*3/uL (ref 0.0–0.1)
Basophils Relative: 1 %
Eosinophils Absolute: 0.1 10*3/uL (ref 0.0–0.5)
Eosinophils Relative: 1 %
HCT: 43.9 % (ref 36.0–46.0)
Hemoglobin: 14.5 g/dL (ref 12.0–15.0)
Immature Granulocytes: 0 %
Lymphocytes Relative: 5 %
Lymphs Abs: 0.5 10*3/uL — ABNORMAL LOW (ref 0.7–4.0)
MCH: 31.2 pg (ref 26.0–34.0)
MCHC: 33 g/dL (ref 30.0–36.0)
MCV: 94.4 fL (ref 80.0–100.0)
Monocytes Absolute: 0.4 10*3/uL (ref 0.1–1.0)
Monocytes Relative: 4 %
Neutro Abs: 9 10*3/uL — ABNORMAL HIGH (ref 1.7–7.7)
Neutrophils Relative %: 89 %
Platelets: 349 10*3/uL (ref 150–400)
RBC: 4.65 MIL/uL (ref 3.87–5.11)
RDW: 12.2 % (ref 11.5–15.5)
WBC: 10.1 10*3/uL (ref 4.0–10.5)
nRBC: 0 % (ref 0.0–0.2)

## 2022-03-03 MED ORDER — AMOXICILLIN-POT CLAVULANATE 875-125 MG PO TABS: 1.0000 | ORAL_TABLET | Freq: Two times a day (BID) | ORAL | 0 refills | Status: DC

## 2022-03-03 NOTE — Progress Notes (Signed)
Pt will like to discuss recent headaches that have been occuring on the left side of her face every night.

## 2022-03-03 NOTE — Progress Notes (Signed)
Hematology/Oncology Consult note [facility]  Telephone:([phone] Fax:([phone]  Patient Care Team: Denton Lank, MD as PCP - General (Family Medicine) Sindy Guadeloupe, MD as Consulting Physician (Hematology and Oncology)   Name of the patient: Laurie [name]  [id number]  1945/06/10   Date of visit: 03/03/22  Diagnosis- stage III biclonal IgA multiple myeloma  Chief complaint/ Reason for visit-on treatment assessment prior to next cycle of Darzalex  Heme/Onc history: [name] who was diagnosed with stage III biclonal IgA multiple myeloma in June 2021. SPEP was 5.0 gm/dL on 10/03/2019. Random urine revealed 196.6 mg/dL total protein with 58.3% M-spike.  Bone marrow biopsy on 11/13/2019 revealed a hypercellular bone marrow with extensive involvement by plasma cell neoplasm. Atypical plasma cells represented 70% of all cells in the aspirate and was associated with prominent interstitial infiltrates and diffuse sheets in the clot.  Plasma cells were kappa light chain restricted.   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   Bone survey on 10/26/2019 revealed small lytic lesions in the skull. There was no acute fracture.   Patient has been on daratumumab Revlimid dexamethasone regimen since July 2021.  She briefly received Retacrit in the past but has not required it for over 1 year now.   Bisphosphonates had to be stopped after treatment was complicated by osteonecrosis of the jaw.  Patient also had 2 episodes of infection in that area and Revlimid has been on hold since March 2023.   Patient is a Sales promotion account executive Witness    Interval history-patient reports having pain  swelling and redness over her chin in the last few days.  There has been some pus draining from the area as well.  Denies any fever.  ECOG PS- 1 Pain scale- 0   Review of systems- Review of Systems  Constitutional:  Negative for chills, fever, malaise/fatigue and weight loss.  HENT:  Negative for congestion, ear discharge and nosebleeds.   Eyes:  Negative for blurred vision.  Respiratory:  Negative for cough, hemoptysis, sputum production, shortness of breath and wheezing.   Cardiovascular:  Negative for chest pain, palpitations, orthopnea and claudication.  Gastrointestinal:  Negative for abdominal pain, blood in stool, constipation, diarrhea, heartburn, melena, nausea and vomiting.  Genitourinary:  Negative for dysuria, flank pain, frequency, hematuria and urgency.  Musculoskeletal:  Negative for back pain, joint pain and myalgias.  Skin:  Negative for rash.  Neurological:  Negative for dizziness, tingling, focal weakness, seizures, weakness and headaches.  Endo/Heme/Allergies:  Does not bruise/bleed easily.  Psychiatric/Behavioral:  Negative for depression and suicidal ideas. The patient does not have insomnia.       Allergies  Allergen Reactions   Levofloxacin     Critical    Other Other (See Comments)    Jehovah Witness (blood)  Antihistamines      Past Medical History:  Diagnosis Date   Anxiety    Asthma    Cellulitis and abscess of oral soft tissues    Cellulitis and abscess of oral soft tissues 03/31/2021   pt in hospital and now out as outpt   Depression    Head injury    Multiple myeloma ([place])    Osteoporosis      Past  Surgical History:  Procedure Laterality Date   ABDOMINAL HYSTERECTOMY     HEMORROIDECTOMY     MOUTH SURGERY     SEPTOPLASTY      Social History   Socioeconomic History   Marital status: Widowed    Spouse name: Not on file   Number of children: Not on file   Years of education: Not on file   Highest education level: Not on file   Occupational History   Not on file  Tobacco Use   Smoking status: Never   Smokeless tobacco: Never  Vaping Use   Vaping Use: Never used  Substance and Sexual Activity   Alcohol use: No   Drug use: No   Sexual activity: Not Currently  Other Topics Concern   Not on file  Social History Narrative   Jehovah Witness    Social Determinants of Health   Financial Resource Strain: Not on file  Food Insecurity: Not on file  Transportation Needs: Not on file  Physical Activity: Not on file  Stress: Not on file  Social Connections: Not on file  Intimate Partner Violence: Not on file    Family History  Problem Relation Age of Onset   Prostate cancer Brother    Throat cancer Maternal Aunt    Breast cancer Cousin        mat cousin   Breast cancer Cousin    Thyroid cancer Other      Current Outpatient Medications:    acyclovir (ZOVIRAX) 400 MG tablet, Take 1 tablet (400 mg total) by mouth 2 (two) times daily., Disp: 180 tablet, Rfl: 3   albuterol (VENTOLIN HFA) 108 (90 Base) MCG/ACT inhaler, 2 inhalations every 4 (four) hours as needed., Disp: , Rfl:    amoxicillin-clavulanate (AUGMENTIN) 875-125 MG tablet, Take 1 tablet by mouth 2 (two) times daily., Disp: 28 tablet, Rfl: 0   aspirin 81 MG chewable tablet, Chew by mouth daily., Disp: , Rfl:    atorvastatin (LIPITOR) 20 MG tablet, Take 20 mg by mouth daily., Disp: , Rfl:    busPIRone (BUSPAR) 10 MG tablet, Take 10 mg by mouth 2 (two) times daily., Disp: , Rfl:    Calcium Carb-Cholecalciferol (CALCIUM 600 + D PO), Take 600 mg by mouth 5 (five) times daily. Increase calcium from 641m 4 times a day to 6048m5 times a day starting today, Disp: , Rfl:    docusate sodium (COLACE) 100 MG capsule, Take 100 mg by mouth 2 (two) times daily., Disp: , Rfl:    montelukast (SINGULAIR) 10 MG tablet, Take 10 mg by mouth daily after breakfast., Disp: , Rfl:    omeprazole (PRILOSEC) 20 MG capsule, Take 20 mg by mouth daily., Disp: , Rfl:     pentoxifylline (TRENTAL) 400 MG CR tablet, Take 400 mg by mouth 2 (two) times daily., Disp: , Rfl:    sertraline (ZOLOFT) 100 MG tablet, Take 100 mg by mouth 2 (two) times daily., Disp: , Rfl:    traZODone (DESYREL) 50 MG tablet, Take 50 mg by mouth at bedtime., Disp: , Rfl:    Vitamin D, Ergocalciferol, (DRISDOL) 50000 UNITS CAPS capsule, Take 50,000 Units by mouth every 30 (thirty) days. Once a month, Disp: , Rfl:    vitamin E 1000 UNIT capsule, Take 1,000 Units by mouth daily., Disp: , Rfl:    chlorhexidine (PERIDEX) 0.12 % solution, 15 mLs 2 (two) times daily., Disp: , Rfl:    dexamethasone (DECADRON) 4 MG tablet, Take 5 tablets (20 mg total) by  mouth once a week. (Patient not taking: Reported on 11/10/2021), Disp: 120 tablet, Rfl: 2   lenalidomide (REVLIMID) 15 MG capsule, Take 1 capsule (15 mg total) by mouth daily. Take for 21 days, then hold for 7 days. Repeat every 28 days.  Rems # [id number] on 09/15/2021 (Patient not taking: Reported on 03/03/2022), Disp: 21 capsule, Rfl: 0   naproxen (NAPROSYN) 500 MG tablet, Take 500 mg by mouth 2 (two) times daily with a meal. (Patient not taking: Reported on 11/10/2021), Disp: , Rfl:    nortriptyline (PAMELOR) 25 MG capsule, Take 25 mg by mouth at bedtime. (Patient not taking: Reported on 03/03/2022), Disp: , Rfl:    ondansetron (ZOFRAN) 8 MG tablet, Take 8 mg by mouth every 8 (eight) hours as needed. (Patient not taking: Reported on 11/10/2021), Disp: , Rfl:   Physical exam:  Vitals:   03/03/22 1000  BP: 137/68  Pulse: (!) 105  Resp: 16  Temp: (!) 96.9 F (36.1 C)  SpO2: 98%  Weight: 184 lb 3.2 oz (83.6 kg)   Physical Exam Constitutional:      General: She is not in acute distress. HENT:     Head:     Comments: There is erythema and fluctuance noted over the chin.  There is a pus pointing at the undersurface of the chin with minimal pustular discharge Cardiovascular:     Rate and Rhythm: Normal rate and regular rhythm.     Heart sounds:  Normal heart sounds.  Pulmonary:     Effort: Pulmonary effort is normal.     Breath sounds: Normal breath sounds.  Skin:    General: Skin is warm and dry.  Neurological:     Mental Status: She is alert and oriented to person, place, and time.         Latest Ref Rng & Units 02/03/2022    9:18 AM  CMP  Glucose 70 - 99 mg/dL 108   BUN 8 - 23 mg/dL 21   Creatinine 0.44 - 1.00 mg/dL 1.03   Sodium 135 - 145 mmol/L 138   Potassium 3.5 - 5.1 mmol/L 4.4   Chloride 98 - 111 mmol/L 102   CO2 22 - 32 mmol/L 28   Calcium 8.9 - 10.3 mg/dL 9.6   Total Protein 6.5 - 8.1 g/dL 6.7   Total Bilirubin 0.3 - 1.2 mg/dL 0.4   Alkaline Phos 38 - 126 U/L 78   AST 15 - 41 U/L 15   ALT 0 - 44 U/L 12       Latest Ref Rng & Units 03/03/2022    9:30 AM  CBC  WBC 4.0 - 10.5 K/uL 10.1   Hemoglobin 12.0 - 15.0 g/dL 14.5   Hematocrit 36.0 - 46.0 % 43.9   Platelets 150 - 400 K/uL 349      Assessment and plan- Patient is a 76 y.o. 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  She is here for on treatment assessment prior to next cycle of monthly Darzalex:  I am holding off on giving her Darzalex today.  I am concerned that she has an area of chin/submandibular abscess.  I am referring her to ENT for possible drainage and I have started her on Augmentin 875 mg twice daily for 14 days.  Patient has seen Dr. Con Memos in the past for neck/tongue abscess due to periodontal disease.  Back then in November 2022 she required hospitalization and IV antibiotics.  Patient also recently underwent surgical procedure  at [facility] for osteonecrosis of the jaw which appears to be healing well  I will see her back in 4 weeks for next cycle of Darzalex.  Myeloma numbers look stable at this time.  Revlimid also continues to be on hold   Visit Diagnosis 1. Multiple myeloma not having achieved remission ([place])   2. Mandibular abscess      Dr. Randa Evens, MD, MPH [facility] [phone] 03/03/2022 1:50 PM

## 2022-03-04 LAB — KAPPA/LAMBDA LIGHT CHAINS
Kappa free light chain: 2.6 mg/L — ABNORMAL LOW (ref 3.3–19.4)
Kappa, lambda light chain ratio: 1.04 (ref 0.26–1.65)
Lambda free light chains: 2.5 mg/L — ABNORMAL LOW (ref 5.7–26.3)

## 2022-03-05 ENCOUNTER — Other Ambulatory Visit: Payer: Self-pay

## 2022-03-06 LAB — MULTIPLE MYELOMA PANEL, SERUM
Albumin SerPl Elph-Mcnc: 3.5 g/dL (ref 2.9–4.4)
Albumin/Glob SerPl: 1.7 (ref 0.7–1.7)
Alpha 1: 0.3 g/dL (ref 0.0–0.4)
Alpha2 Glob SerPl Elph-Mcnc: 0.8 g/dL (ref 0.4–1.0)
B-Globulin SerPl Elph-Mcnc: 0.9 g/dL (ref 0.7–1.3)
Gamma Glob SerPl Elph-Mcnc: 0.2 g/dL — ABNORMAL LOW (ref 0.4–1.8)
Globulin, Total: 2.1 g/dL — ABNORMAL LOW (ref 2.2–3.9)
IgA: 19 mg/dL — ABNORMAL LOW (ref 64–422)
IgG (Immunoglobin G), Serum: 231 mg/dL — ABNORMAL LOW (ref 586–1602)
IgM (Immunoglobulin M), Srm: 13 mg/dL — ABNORMAL LOW (ref 26–217)
M Protein SerPl Elph-Mcnc: 0.1 g/dL — ABNORMAL HIGH
Total Protein ELP: 5.6 g/dL — ABNORMAL LOW (ref 6.0–8.5)

## 2022-03-10 ENCOUNTER — Other Ambulatory Visit: Payer: Self-pay

## 2022-03-31 ENCOUNTER — Telehealth: Payer: Self-pay | Admitting: Pharmacist

## 2022-03-31 ENCOUNTER — Inpatient Hospital Stay (HOSPITAL_BASED_OUTPATIENT_CLINIC_OR_DEPARTMENT_OTHER): Payer: Medicare HMO | Admitting: Oncology

## 2022-03-31 ENCOUNTER — Telehealth: Payer: Self-pay

## 2022-03-31 ENCOUNTER — Other Ambulatory Visit (HOSPITAL_COMMUNITY): Payer: Self-pay

## 2022-03-31 ENCOUNTER — Inpatient Hospital Stay: Payer: Medicare HMO

## 2022-03-31 ENCOUNTER — Inpatient Hospital Stay: Payer: Medicare HMO | Attending: Oncology

## 2022-03-31 ENCOUNTER — Encounter: Payer: Self-pay | Admitting: Oncology

## 2022-03-31 VITALS — BP 162/90 | HR 100 | Temp 96.3°F | Resp 16 | Wt 181.6 lb

## 2022-03-31 DIAGNOSIS — C9000 Multiple myeloma not having achieved remission: Secondary | ICD-10-CM

## 2022-03-31 DIAGNOSIS — Z808 Family history of malignant neoplasm of other organs or systems: Secondary | ICD-10-CM | POA: Diagnosis not present

## 2022-03-31 DIAGNOSIS — Z79899 Other long term (current) drug therapy: Secondary | ICD-10-CM | POA: Diagnosis not present

## 2022-03-31 DIAGNOSIS — Z5112 Encounter for antineoplastic immunotherapy: Secondary | ICD-10-CM | POA: Insufficient documentation

## 2022-03-31 DIAGNOSIS — E538 Deficiency of other specified B group vitamins: Secondary | ICD-10-CM | POA: Diagnosis present

## 2022-03-31 DIAGNOSIS — Z803 Family history of malignant neoplasm of breast: Secondary | ICD-10-CM | POA: Diagnosis not present

## 2022-03-31 DIAGNOSIS — Z7961 Long term (current) use of immunomodulator: Secondary | ICD-10-CM | POA: Insufficient documentation

## 2022-03-31 DIAGNOSIS — Z7982 Long term (current) use of aspirin: Secondary | ICD-10-CM | POA: Diagnosis not present

## 2022-03-31 DIAGNOSIS — Z801 Family history of malignant neoplasm of trachea, bronchus and lung: Secondary | ICD-10-CM | POA: Diagnosis not present

## 2022-03-31 DIAGNOSIS — Z79624 Long term (current) use of inhibitors of nucleotide synthesis: Secondary | ICD-10-CM | POA: Insufficient documentation

## 2022-03-31 DIAGNOSIS — D649 Anemia, unspecified: Secondary | ICD-10-CM

## 2022-03-31 DIAGNOSIS — Z5111 Encounter for antineoplastic chemotherapy: Secondary | ICD-10-CM

## 2022-03-31 DIAGNOSIS — Z8042 Family history of malignant neoplasm of prostate: Secondary | ICD-10-CM | POA: Insufficient documentation

## 2022-03-31 LAB — CBC WITH DIFFERENTIAL/PLATELET
Abs Immature Granulocytes: 0.04 10*3/uL (ref 0.00–0.07)
Basophils Absolute: 0.1 10*3/uL (ref 0.0–0.1)
Basophils Relative: 1 %
Eosinophils Absolute: 0.2 10*3/uL (ref 0.0–0.5)
Eosinophils Relative: 3 %
HCT: 41.8 % (ref 36.0–46.0)
Hemoglobin: 13.8 g/dL (ref 12.0–15.0)
Immature Granulocytes: 1 %
Lymphocytes Relative: 9 %
Lymphs Abs: 0.8 10*3/uL (ref 0.7–4.0)
MCH: 30.5 pg (ref 26.0–34.0)
MCHC: 33 g/dL (ref 30.0–36.0)
MCV: 92.5 fL (ref 80.0–100.0)
Monocytes Absolute: 0.4 10*3/uL (ref 0.1–1.0)
Monocytes Relative: 5 %
Neutro Abs: 6.8 10*3/uL (ref 1.7–7.7)
Neutrophils Relative %: 81 %
Platelets: 318 10*3/uL (ref 150–400)
RBC: 4.52 MIL/uL (ref 3.87–5.11)
RDW: 13.1 % (ref 11.5–15.5)
WBC: 8.3 10*3/uL (ref 4.0–10.5)
nRBC: 0 % (ref 0.0–0.2)

## 2022-03-31 MED ORDER — LENALIDOMIDE 15 MG PO CAPS: 15.0000 mg | ORAL_CAPSULE | Freq: Every day | ORAL | 0 refills | Status: DC

## 2022-03-31 MED ORDER — DIPHENHYDRAMINE HCL 25 MG PO CAPS: 50.0000 mg | ORAL_CAPSULE | Freq: Once | ORAL | Status: DC

## 2022-03-31 MED ORDER — DEXAMETHASONE 4 MG PO TABS: 20.0000 mg | ORAL_TABLET | Freq: Once | ORAL | Status: DC

## 2022-03-31 MED ORDER — DARATUMUMAB-HYALURONIDASE-FIHJ 1800-30000 MG-UT/15ML ~~LOC~~ SOLN
1800.0000 mg | Freq: Once | SUBCUTANEOUS | Status: AC
  Administered 2022-03-31: 1800 mg via SUBCUTANEOUS
  Filled 2022-03-31: qty 15

## 2022-03-31 MED ORDER — CYANOCOBALAMIN 1000 MCG/ML IJ SOLN
1000.0000 ug | Freq: Once | INTRAMUSCULAR | Status: AC
  Administered 2022-03-31: 1000 ug via INTRAMUSCULAR

## 2022-03-31 NOTE — Progress Notes (Signed)
Pt in for follow up, reports having some drainage from chin. Pt has follow up with dentist next week.

## 2022-03-31 NOTE — Telephone Encounter (Signed)
Oral Oncology Pharmacist Encounter  Received notification that patient was going to be resuming therapy with Revlimid (lenalidomide) for the treatment of IgA kappa multiple myeloma in conjunction with daratumumab and dexamethasone, planned duration until disease progression or unacceptable drug toxicity.  CMP from 02/03/22 assessed, no relevant lab abnormalities. Prescription dose and frequency assessed.   Current medication list in Epic reviewed, no DDIs with lenalidomide identified.  Evaluated chart and no patient barriers to medication adherence identified.   Prescription has been e-scribed to [place].  Oral Oncology Clinic will continue to follow for insurance authorization, copayment issues, initial counseling and start date.   Darl Pikes, PharmD, BCPS, BCOP, CPP Hematology/Oncology Clinical Pharmacist Practitioner [place]/DB/AP [facility] [phone]  03/31/2022 11:08 AM

## 2022-03-31 NOTE — Progress Notes (Signed)
Hematology/Oncology Consult note [facility]  Telephone:([phone] Fax:([phone]  Patient Care Team: Denton Lank, MD as PCP - General (Family Medicine) Sindy Guadeloupe, MD as Consulting Physician (Hematology and Oncology)   Name of the patient: Laurie [name]  [id number]  February 25, 1946   Date of visit: 03/31/22  Diagnosis-  stage III biclonal IgA multiple myeloma   Chief complaint/ Reason for visit-on treatment assessment prior to next cycle of Darzalex  Heme/Onc history: [name] who was diagnosed with stage III biclonal IgA multiple myeloma in June 2021. SPEP was 5.0 gm/dL on 10/03/2019. Random urine revealed 196.6 mg/dL total protein with 58.3% M-spike.  Bone marrow biopsy on 11/13/2019 revealed a hypercellular bone marrow with extensive involvement by plasma cell neoplasm. Atypical plasma cells represented 70% of all cells in the aspirate and was associated with prominent interstitial infiltrates and diffuse sheets in the clot.  Plasma cells were kappa light chain restricted.   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   Bone survey on 10/26/2019 revealed small lytic lesions in the skull. There was no acute fracture.   Patient has been on daratumumab Revlimid dexamethasone regimen since July 2021.  She briefly received Retacrit in the past but has not required it for over 1 year now.   Bisphosphonates had to be stopped after treatment was complicated by osteonecrosis of the jaw.  Patient also had 2 episodes of infection in that area and Revlimid has been on hold since March 2023.   Patient is a Sales promotion account executive Witness  Interval history-she is healing well.  She has  completed her antibiotic course.  Otherwise doing well and denies other complaints  ECOG PS- 1 Pain scale- 0   Review of systems- Review of Systems  Constitutional:  Negative for chills, fever, malaise/fatigue and weight loss.  HENT:  Negative for congestion, ear discharge and nosebleeds.   Eyes:  Negative for blurred vision.  Respiratory:  Negative for cough, hemoptysis, sputum production, shortness of breath and wheezing.   Cardiovascular:  Negative for chest pain, palpitations, orthopnea and claudication.  Gastrointestinal:  Negative for abdominal pain, blood in stool, constipation, diarrhea, heartburn, melena, nausea and vomiting.  Genitourinary:  Negative for dysuria, flank pain, frequency, hematuria and urgency.  Musculoskeletal:  Negative for back pain, joint pain and myalgias.  Skin:  Negative for rash.  Neurological:  Negative for dizziness, tingling, focal weakness, seizures, weakness and headaches.  Endo/Heme/Allergies:  Does not bruise/bleed easily.  Psychiatric/Behavioral:  Negative for depression and suicidal ideas. The patient does not have insomnia.       Allergies  Allergen Reactions   Levofloxacin     Critical    Other Other (See Comments)    Jehovah Witness (blood)  Antihistamines      Past Medical History:  Diagnosis Date   Anxiety    Asthma    Cellulitis and abscess of oral soft tissues    Cellulitis and abscess of oral soft tissues 03/31/2021   pt in hospital and now out as outpt   Depression    Head injury    Multiple myeloma ([place])    Osteoporosis      Past Surgical History:  Procedure Laterality Date   ABDOMINAL HYSTERECTOMY  HEMORROIDECTOMY     MOUTH SURGERY     SEPTOPLASTY      Social History   Socioeconomic History   Marital status: Widowed    Spouse name: Not on file   Number of children: Not on file   Years of education: Not on file   Highest education level: Not on file  Occupational History   Not on file  Tobacco Use    Smoking status: Never   Smokeless tobacco: Never  Vaping Use   Vaping Use: Never used  Substance and Sexual Activity   Alcohol use: No   Drug use: No   Sexual activity: Not Currently  Other Topics Concern   Not on file  Social History Narrative   Jehovah Witness    Social Determinants of Health   Financial Resource Strain: Not on file  Food Insecurity: Not on file  Transportation Needs: Not on file  Physical Activity: Not on file  Stress: Not on file  Social Connections: Not on file  Intimate Partner Violence: Not on file    Family History  Problem Relation Age of Onset   Prostate cancer Brother    Throat cancer Maternal Aunt    Breast cancer Cousin        mat cousin   Breast cancer Cousin    Thyroid cancer Other      Current Outpatient Medications:    acyclovir (ZOVIRAX) 400 MG tablet, Take 1 tablet (400 mg total) by mouth 2 (two) times daily., Disp: 180 tablet, Rfl: 3   albuterol (VENTOLIN HFA) 108 (90 Base) MCG/ACT inhaler, 2 inhalations every 4 (four) hours as needed., Disp: , Rfl:    aspirin 81 MG chewable tablet, Chew by mouth daily., Disp: , Rfl:    atorvastatin (LIPITOR) 20 MG tablet, Take 20 mg by mouth daily., Disp: , Rfl:    busPIRone (BUSPAR) 10 MG tablet, Take 10 mg by mouth 2 (two) times daily., Disp: , Rfl:    Calcium Carb-Cholecalciferol (CALCIUM 600 + D PO), Take 600 mg by mouth 5 (five) times daily. Increase calcium from 623m 4 times a day to 6037m5 times a day starting today, Disp: , Rfl:    chlorhexidine (PERIDEX) 0.12 % solution, 15 mLs 2 (two) times daily., Disp: , Rfl:    docusate sodium (COLACE) 100 MG capsule, Take 100 mg by mouth 2 (two) times daily., Disp: , Rfl:    montelukast (SINGULAIR) 10 MG tablet, Take 10 mg by mouth daily after breakfast., Disp: , Rfl:    nortriptyline (PAMELOR) 25 MG capsule, Take 25 mg by mouth at bedtime., Disp: , Rfl:    omeprazole (PRILOSEC) 20 MG capsule, Take 20 mg by mouth daily., Disp: , Rfl:     ondansetron (ZOFRAN) 8 MG tablet, Take 8 mg by mouth every 8 (eight) hours as needed., Disp: , Rfl:    pentoxifylline (TRENTAL) 400 MG CR tablet, Take 400 mg by mouth 2 (two) times daily., Disp: , Rfl:    sertraline (ZOLOFT) 100 MG tablet, Take 100 mg by mouth 2 (two) times daily., Disp: , Rfl:    traZODone (DESYREL) 50 MG tablet, Take 50 mg by mouth at bedtime., Disp: , Rfl:    Vitamin D, Ergocalciferol, (DRISDOL) 50000 UNITS CAPS capsule, Take 50,000 Units by mouth every 30 (thirty) days. Once a month, Disp: , Rfl:    vitamin E 1000 UNIT capsule, Take 1,000 Units by mouth daily., Disp: , Rfl:    dexamethasone (DECADRON) 4 MG tablet, Take 5  tablets (20 mg total) by mouth once a week. (Patient not taking: Reported on 11/10/2021), Disp: 120 tablet, Rfl: 2   lenalidomide (REVLIMID) 15 MG capsule, Take 1 capsule (15 mg total) by mouth daily. Take for 21 days, then hold for 7 days. Repeat every 28 days., Disp: 21 capsule, Rfl: 0 No current facility-administered medications for this visit.  Facility-Administered Medications Ordered in Other Visits:    dexamethasone (DECADRON) tablet 20 mg, 20 mg, Oral, Once, Sindy Guadeloupe, MD   diphenhydrAMINE (BENADRYL) capsule 50 mg, 50 mg, Oral, Once, Sindy Guadeloupe, MD  Physical exam:  Vitals:   03/31/22 1009  BP: (!) 162/90  Pulse: 100  Resp: 16  Temp: (!) 96.3 F (35.7 C)  TempSrc: Tympanic  SpO2: 96%  Weight: 181 lb 9.6 oz (82.4 kg)   Physical Exam Constitutional:      General: She is not in acute distress. HENT:     Head:     Comments: No erythema or swelling noted over the chin.  There is a small opening at the undersurface of the chin with minimal serous discharge. Cardiovascular:     Rate and Rhythm: Normal rate and regular rhythm.     Heart sounds: Normal heart sounds.  Pulmonary:     Effort: Pulmonary effort is normal.     Breath sounds: Normal breath sounds.  Abdominal:     General: Bowel sounds are normal.     Palpations: Abdomen is  soft.  Skin:    General: Skin is warm and dry.  Neurological:     Mental Status: She is alert and oriented to person, place, and time.         Latest Ref Rng & Units 02/03/2022    9:18 AM  CMP  Glucose 70 - 99 mg/dL 108   BUN 8 - 23 mg/dL 21   Creatinine 0.44 - 1.00 mg/dL 1.03   Sodium 135 - 145 mmol/L 138   Potassium 3.5 - 5.1 mmol/L 4.4   Chloride 98 - 111 mmol/L 102   CO2 22 - 32 mmol/L 28   Calcium 8.9 - 10.3 mg/dL 9.6   Total Protein 6.5 - 8.1 g/dL 6.7   Total Bilirubin 0.3 - 1.2 mg/dL 0.4   Alkaline Phos 38 - 126 U/L 78   AST 15 - 41 U/L 15   ALT 0 - 44 U/L 12       Latest Ref Rng & Units 03/31/2022    9:19 AM  CBC  WBC 4.0 - 10.5 K/uL 8.3   Hemoglobin 12.0 - 15.0 g/dL 13.8   Hematocrit 36.0 - 46.0 % 41.8   Platelets 150 - 400 K/uL 318      Assessment and plan- Patient is a 76 y.o. 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  She is here for on treatment assessment prior to next cycle of monthly Darzalex  Counts okay to proceed with Darzalex today.  Her chin abscess has healed well.  She does not require any further antibiotics at this time.  Patient knows to get in touch with ENT should the chin abscess start worsening again in the future.  I will see her back in 4 weeks for next cycle of Darzalex.  Patient will continue low-dose aspirin as well as acyclovir prophylaxis.  We are restarting her Revlimid at this time which she will take 2 weeks on and 1 week off 15 mg.  Myeloma panel shows no evidence of M protein.  Serum free  light chain ratio remains normal   Visit Diagnosis 1. Multiple myeloma not having achieved remission ([place])   2. Encounter for antineoplastic chemotherapy      Dr. Randa Evens, MD, MPH [facility] [phone] 03/31/2022 4:22 PM

## 2022-03-31 NOTE — Telephone Encounter (Signed)
Oral Oncology Patient Advocate Encounter   Received notification that prior authorization for Lenalidomide is required.   PA submitted on 11.28.23  Key [id number]   Status is pending     Berdine Addison, [place] Patient [place]  [phone] (phone) [phone] (fax) 03/31/2022 11:11 AM

## 2022-03-31 NOTE — Patient Instructions (Signed)
[facility]  Discharge Instructions: Thank you for choosing [facility] to provide your oncology and hematology care.  If you have a lab appointment with the Cancer Center, please go directly to the Cancer Center and check in at the registration area.  Wear comfortable clothing and clothing appropriate for easy access to any Portacath or PICC line.   We strive to give you quality time with your provider. You may need to reschedule your appointment if you arrive late (15 or more minutes).  Arriving late affects you and other patients whose appointments are after yours.  Also, if you miss three or more appointments without notifying the office, you may be dismissed from the clinic at the provider's discretion.      For prescription refill requests, have your pharmacy contact our office and allow 72 hours for refills to be completed.       To help prevent nausea and vomiting after your treatment, we encourage you to take your nausea medication as directed.  BELOW ARE SYMPTOMS THAT SHOULD BE REPORTED IMMEDIATELY: *FEVER GREATER THAN 100.4 F (38 C) OR HIGHER *CHILLS OR SWEATING *NAUSEA AND VOMITING THAT IS NOT CONTROLLED WITH YOUR NAUSEA MEDICATION *UNUSUAL SHORTNESS OF BREATH *UNUSUAL BRUISING OR BLEEDING *URINARY PROBLEMS (pain or burning when urinating, or frequent urination) *BOWEL PROBLEMS (unusual diarrhea, constipation, pain near the anus) TENDERNESS IN MOUTH AND THROAT WITH OR WITHOUT PRESENCE OF ULCERS (sore throat, sores in mouth, or a toothache) UNUSUAL RASH, SWELLING OR PAIN  UNUSUAL VAGINAL DISCHARGE OR ITCHING   Items with * indicate a potential emergency and should be followed up as soon as possible or go to the Emergency Department if any problems should occur.  Please show the CHEMOTHERAPY ALERT CARD or IMMUNOTHERAPY ALERT CARD at check-in to the Emergency Department and triage nurse.  Should you have questions after your  visit or need to cancel or reschedule your appointment, please contact [facility]  [phone] and follow the prompts.  Office hours are 8:00 a.m. to 4:30 p.m. Monday - Friday. Please note that voicemails left after 4:00 p.m. may not be returned until the following business day.  We are closed weekends and major holidays. You have access to a nurse at all times for urgent questions. Please call the main number to the clinic [phone] and follow the prompts.  For any non-urgent questions, you may also contact your provider using MyChart. We now offer e-Visits for anyone 18 and older to request care online for non-urgent symptoms. For details visit [email or web address].   Also download the MyChart app! Go to the app store, search "MyChart", open the app, select [facility], and log in with your MyChart username and password.  Masks are optional in the cancer centers. If you would like for your care team to wear a mask while they are taking care of you, please let them know. For doctor visits, patients may have with them one support person who is at least 74 years old. At this time, visitors are not allowed in the infusion area.   

## 2022-03-31 NOTE — Telephone Encounter (Signed)
Oral Oncology Patient Advocate Encounter  Prior Authorization for Lenalidomide has been approved.    PA# [id number]  Effective dates: 03.01.23 through 12.31.23  Patients co-pay is $0.    Berdine Addison, [place] [facility] [place]  [phone] (phone) [phone] (fax) 03/31/2022 11:20 AM

## 2022-04-01 LAB — KAPPA/LAMBDA LIGHT CHAINS
Kappa free light chain: 4.5 mg/L (ref 3.3–19.4)
Kappa, lambda light chain ratio: 1.96 — ABNORMAL HIGH (ref 0.26–1.65)
Lambda free light chains: 2.3 mg/L — ABNORMAL LOW (ref 5.7–26.3)

## 2022-04-02 LAB — MULTIPLE MYELOMA PANEL, SERUM
Albumin SerPl Elph-Mcnc: 3.7 g/dL (ref 2.9–4.4)
Albumin/Glob SerPl: 1.8 — ABNORMAL HIGH (ref 0.7–1.7)
Alpha 1: 0.2 g/dL (ref 0.0–0.4)
Alpha2 Glob SerPl Elph-Mcnc: 0.7 g/dL (ref 0.4–1.0)
B-Globulin SerPl Elph-Mcnc: 0.9 g/dL (ref 0.7–1.3)
Gamma Glob SerPl Elph-Mcnc: 0.2 g/dL — ABNORMAL LOW (ref 0.4–1.8)
Globulin, Total: 2.1 g/dL — ABNORMAL LOW (ref 2.2–3.9)
IgA: 22 mg/dL — ABNORMAL LOW (ref 64–422)
IgG (Immunoglobin G), Serum: 255 mg/dL — ABNORMAL LOW (ref 586–1602)
IgM (Immunoglobulin M), Srm: 27 mg/dL (ref 26–217)
Total Protein ELP: 5.8 g/dL — ABNORMAL LOW (ref 6.0–8.5)

## 2022-04-03 ENCOUNTER — Other Ambulatory Visit: Payer: Self-pay

## 2022-04-06 ENCOUNTER — Other Ambulatory Visit: Payer: Self-pay

## 2022-04-21 ENCOUNTER — Other Ambulatory Visit: Payer: Self-pay

## 2022-04-23 ENCOUNTER — Telehealth: Payer: Self-pay

## 2022-04-23 ENCOUNTER — Other Ambulatory Visit: Payer: Self-pay | Admitting: *Deleted

## 2022-04-23 DIAGNOSIS — C9000 Multiple myeloma not having achieved remission: Secondary | ICD-10-CM

## 2022-04-23 NOTE — Telephone Encounter (Signed)
Oral Oncology Patient Advocate Encounter  Was successful in securing patient a $12,000 grant from [facility] to provide copayment coverage for Lenalidomide.  This will keep the out of pocket expense at $0.     [employer] ID: [id number]  I have spoken with the patient.   The billing information is as follows and has been shared with [employer].    RxBin: [id number] PCN: [id number] Member ID: [id number] Group ID: [id number] Dates of Eligibility: 05/05/22 through 05/05/23  Fund:  Multiple Myeloma - Medicare [place], [facility] Patient [place]  [phone] (phone) [phone] (fax) 04/23/2022 11:02 AM

## 2022-04-24 MED ORDER — LENALIDOMIDE 15 MG PO CAPS: 15.0000 mg | ORAL_CAPSULE | Freq: Every day | ORAL | 0 refills | Status: DC

## 2022-04-28 ENCOUNTER — Other Ambulatory Visit: Payer: Medicare HMO

## 2022-04-28 ENCOUNTER — Ambulatory Visit: Payer: Medicare HMO

## 2022-04-28 ENCOUNTER — Ambulatory Visit: Payer: Medicare HMO | Admitting: Oncology

## 2022-04-29 ENCOUNTER — Inpatient Hospital Stay: Payer: Medicare HMO | Attending: Oncology

## 2022-04-29 ENCOUNTER — Encounter: Payer: Self-pay | Admitting: Oncology

## 2022-04-29 ENCOUNTER — Inpatient Hospital Stay: Payer: Medicare HMO

## 2022-04-29 ENCOUNTER — Inpatient Hospital Stay (HOSPITAL_BASED_OUTPATIENT_CLINIC_OR_DEPARTMENT_OTHER): Payer: Medicare HMO | Admitting: Oncology

## 2022-04-29 VITALS — BP 139/87 | HR 97 | Temp 98.2°F | Resp 16 | Ht 63.39 in | Wt 179.0 lb

## 2022-04-29 DIAGNOSIS — C9000 Multiple myeloma not having achieved remission: Secondary | ICD-10-CM | POA: Insufficient documentation

## 2022-04-29 DIAGNOSIS — Z79899 Other long term (current) drug therapy: Secondary | ICD-10-CM

## 2022-04-29 DIAGNOSIS — Z801 Family history of malignant neoplasm of trachea, bronchus and lung: Secondary | ICD-10-CM | POA: Diagnosis not present

## 2022-04-29 DIAGNOSIS — Z8042 Family history of malignant neoplasm of prostate: Secondary | ICD-10-CM | POA: Diagnosis not present

## 2022-04-29 DIAGNOSIS — D649 Anemia, unspecified: Secondary | ICD-10-CM

## 2022-04-29 DIAGNOSIS — Z5112 Encounter for antineoplastic immunotherapy: Secondary | ICD-10-CM | POA: Insufficient documentation

## 2022-04-29 DIAGNOSIS — Z5111 Encounter for antineoplastic chemotherapy: Secondary | ICD-10-CM

## 2022-04-29 DIAGNOSIS — Z803 Family history of malignant neoplasm of breast: Secondary | ICD-10-CM | POA: Insufficient documentation

## 2022-04-29 DIAGNOSIS — E538 Deficiency of other specified B group vitamins: Secondary | ICD-10-CM | POA: Insufficient documentation

## 2022-04-29 LAB — CBC WITH DIFFERENTIAL/PLATELET
Abs Immature Granulocytes: 0.04 10*3/uL (ref 0.00–0.07)
Basophils Absolute: 0.1 10*3/uL (ref 0.0–0.1)
Basophils Relative: 1 %
Eosinophils Absolute: 0.3 10*3/uL (ref 0.0–0.5)
Eosinophils Relative: 5 %
HCT: 40.8 % (ref 36.0–46.0)
Hemoglobin: 13.5 g/dL (ref 12.0–15.0)
Immature Granulocytes: 1 %
Lymphocytes Relative: 14 %
Lymphs Abs: 0.8 10*3/uL (ref 0.7–4.0)
MCH: 30.4 pg (ref 26.0–34.0)
MCHC: 33.1 g/dL (ref 30.0–36.0)
MCV: 91.9 fL (ref 80.0–100.0)
Monocytes Absolute: 0.6 10*3/uL (ref 0.1–1.0)
Monocytes Relative: 11 %
Neutro Abs: 3.8 10*3/uL (ref 1.7–7.7)
Neutrophils Relative %: 68 %
Platelets: 281 10*3/uL (ref 150–400)
RBC: 4.44 MIL/uL (ref 3.87–5.11)
RDW: 12.8 % (ref 11.5–15.5)
WBC: 5.6 10*3/uL (ref 4.0–10.5)
nRBC: 0 % (ref 0.0–0.2)

## 2022-04-29 MED ORDER — DEXAMETHASONE 4 MG PO TABS: 20.0000 mg | ORAL_TABLET | Freq: Once | ORAL | Status: DC

## 2022-04-29 MED ORDER — DARATUMUMAB-HYALURONIDASE-FIHJ 1800-30000 MG-UT/15ML ~~LOC~~ SOLN
1800.0000 mg | Freq: Once | SUBCUTANEOUS | Status: AC
  Administered 2022-04-29: 1800 mg via SUBCUTANEOUS
  Filled 2022-04-29: qty 15

## 2022-04-29 MED ORDER — CYANOCOBALAMIN 1000 MCG/ML IJ SOLN
1000.0000 ug | Freq: Once | INTRAMUSCULAR | Status: AC
  Administered 2022-04-29: 1000 ug via INTRAMUSCULAR
  Filled 2022-04-29: qty 1

## 2022-04-29 MED ORDER — DIPHENHYDRAMINE HCL 25 MG PO CAPS: 50.0000 mg | ORAL_CAPSULE | Freq: Once | ORAL | Status: DC

## 2022-04-29 NOTE — Progress Notes (Signed)
Hematology/Oncology Consult note [facility]  Telephone:([phone] [phone]  Patient Care Team: Denton Lank, MD as PCP - General (Family Medicine) Sindy Guadeloupe, MD as Consulting Physician (Hematology and Oncology)   Name of the patient: Laurie [name]  [id number]  January 13, 1946   Date of visit: 04/29/22  Diagnosis- stage III biclonal IgA multiple myeloma   Chief complaint/ Reason for visit-on treatment assessment prior to next cycle of Darzalex  Heme/Onc history: [name] who was diagnosed with stage III biclonal IgA multiple myeloma in June 2021. SPEP was 5.0 gm/dL on 10/03/2019. Random urine revealed 196.6 mg/dL total protein with 58.3% M-spike.  Bone marrow biopsy on 11/13/2019 revealed a hypercellular bone marrow with extensive involvement by plasma cell neoplasm. Atypical plasma cells represented 70% of all cells in the aspirate and was associated with prominent interstitial infiltrates and diffuse sheets in the clot.  Plasma cells were kappa light chain restricted.   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   Bone survey on 10/26/2019 revealed small lytic lesions in the skull. There was no acute fracture.   Patient has been on daratumumab Revlimid dexamethasone regimen since July 2021.  She briefly received Retacrit in the past but has not required it for over 1 year now.   Patient is a Sales promotion account executive Witness    Interval history-chin abscess has not increased in size and continues to drain very little amounts.  She has noticed some redness on either side of that abscess which is somewhat tender to touch but without any drainage or pus pointing.  Denies any  fevers.  ECOG PS- 1 Pain scale- 0   Review of systems- Review of Systems  Constitutional:  Negative for chills, fever, malaise/fatigue and weight loss.  HENT:  Negative for congestion, ear discharge and nosebleeds.   Eyes:  Negative for blurred vision.  Respiratory:  Negative for cough, hemoptysis, sputum production, shortness of breath and wheezing.   Cardiovascular:  Negative for chest pain, palpitations, orthopnea and claudication.  Gastrointestinal:  Negative for abdominal pain, blood in stool, constipation, diarrhea, heartburn, melena, nausea and vomiting.  Genitourinary:  Negative for dysuria, flank pain, frequency, hematuria and urgency.  Musculoskeletal:  Negative for back pain, joint pain and myalgias.  Skin:  Negative for rash.  Neurological:  Negative for dizziness, tingling, focal weakness, seizures, weakness and headaches.  Endo/Heme/Allergies:  Does not bruise/bleed easily.  Psychiatric/Behavioral:  Negative for depression and suicidal ideas. The patient does not have insomnia.       Allergies  Allergen Reactions   Levofloxacin     Critical    Other Other (See Comments)    Jehovah Witness (blood)       Past Medical History:  Diagnosis Date   Anxiety    Asthma    Cellulitis and abscess of oral soft tissues    Cellulitis and abscess of oral soft tissues 03/31/2021   pt in hospital and now out as outpt   Depression    Head injury    Multiple myeloma ([place])    Osteoporosis      Past Surgical History:  Procedure Laterality Date   ABDOMINAL HYSTERECTOMY     HEMORROIDECTOMY     MOUTH SURGERY  SEPTOPLASTY      Social History   Socioeconomic History   Marital status: Widowed    Spouse name: Not on file   Number of children: Not on file   Years of education: Not on file   Highest education level: Not on file  Occupational History   Not on file  Tobacco Use   Smoking status: Never   Smokeless tobacco: Never  Vaping Use   Vaping Use: Never  used  Substance and Sexual Activity   Alcohol use: No   Drug use: No   Sexual activity: Not Currently  Other Topics Concern   Not on file  Social History Narrative   Jehovah Witness    Social Determinants of Health   Financial Resource Strain: Not on file  Food Insecurity: Not on file  Transportation Needs: Not on file  Physical Activity: Not on file  Stress: Not on file  Social Connections: Not on file  Intimate Partner Violence: Not on file    Family History  Problem Relation Age of Onset   Prostate cancer Brother    Throat cancer Maternal Aunt    Breast cancer Cousin        mat cousin   Breast cancer Cousin    Thyroid cancer Other      Current Outpatient Medications:    acetaminophen (TYLENOL) 325 MG tablet, Take 650 mg by mouth every 8 (eight) hours as needed for mild pain., Disp: , Rfl:    acyclovir (ZOVIRAX) 400 MG tablet, Take 1 tablet (400 mg total) by mouth 2 (two) times daily., Disp: 180 tablet, Rfl: 3   albuterol (VENTOLIN HFA) 108 (90 Base) MCG/ACT inhaler, 2 inhalations every 4 (four) hours as needed., Disp: , Rfl:    aspirin 81 MG chewable tablet, Chew by mouth daily., Disp: , Rfl:    atorvastatin (LIPITOR) 20 MG tablet, Take 20 mg by mouth daily., Disp: , Rfl:    busPIRone (BUSPAR) 10 MG tablet, Take 10 mg by mouth 2 (two) times daily., Disp: , Rfl:    Calcium Carb-Cholecalciferol (CALCIUM 600 + D PO), Take 600 mg by mouth 4 (four) times daily. Increase calcium from 673m 4 times a day to 6017m5 times a day starting today, Disp: , Rfl:    chlorhexidine (PERIDEX) 0.12 % solution, 15 mLs 2 (two) times daily., Disp: , Rfl:    docusate sodium (COLACE) 100 MG capsule, Take 100 mg by mouth 2 (two) times daily., Disp: , Rfl:    lenalidomide (REVLIMID) 15 MG capsule, Take 1 capsule (15 mg total) by mouth daily. Take for 21 days, then hold for 7 days. Repeat every 28 days. Obtained on 04/23/2022 , REMS: [id number]: 21 capsule, Rfl: 0   montelukast (SINGULAIR) 10  MG tablet, Take 10 mg by mouth daily after breakfast., Disp: , Rfl:    nortriptyline (PAMELOR) 25 MG capsule, Take 25 mg by mouth at bedtime., Disp: , Rfl:    omeprazole (PRILOSEC) 20 MG capsule, Take 20 mg by mouth daily., Disp: , Rfl:    ondansetron (ZOFRAN) 8 MG tablet, Take 8 mg by mouth every 8 (eight) hours as needed., Disp: , Rfl:    pentoxifylline (TRENTAL) 400 MG CR tablet, Take 400 mg by mouth 2 (two) times daily., Disp: , Rfl:    sertraline (ZOLOFT) 100 MG tablet, Take 100 mg by mouth 2 (two) times daily., Disp: , Rfl:    traZODone (DESYREL) 50 MG tablet, Take 50 mg by mouth at bedtime., Disp: ,  Rfl:    Vitamin D, Ergocalciferol, (DRISDOL) 50000 UNITS CAPS capsule, Take 50,000 Units by mouth every 30 (thirty) days. Once a month, Disp: , Rfl:    vitamin E 1000 UNIT capsule, Take 1,000 Units by mouth daily., Disp: , Rfl:    dexamethasone (DECADRON) 4 MG tablet, Take 5 tablets (20 mg total) by mouth once a week. (Patient not taking: Reported on 11/10/2021), Disp: 120 tablet, Rfl: 2  Physical exam:  Vitals:   04/29/22 1055  BP: 139/87  Pulse: 97  Resp: 16  Temp: 98.2 F (36.8 C)  TempSrc: Oral  Weight: 179 lb (81.2 kg)  Height: 5' 3.39" (1.61 m)   Physical Exam HENT:     Head:     Comments: There is chronic healed abscess involving the chin with pus pointing which drains very little discharge.  There is redness noted on either side of the lower jaw without any significant fluctuance or local warmth. Cardiovascular:     Rate and Rhythm: Normal rate and regular rhythm.     Heart sounds: Normal heart sounds.  Pulmonary:     Effort: Pulmonary effort is normal.     Breath sounds: Normal breath sounds.  Abdominal:     General: Bowel sounds are normal.     Palpations: Abdomen is soft.  Skin:    General: Skin is warm and dry.  Neurological:     Mental Status: She is alert and oriented to person, place, and time.         Latest Ref Rng & Units 02/03/2022    9:18 AM  CMP   Glucose 70 - 99 mg/dL 108   BUN 8 - 23 mg/dL 21   Creatinine 0.44 - 1.00 mg/dL 1.03   Sodium 135 - 145 mmol/L 138   Potassium 3.5 - 5.1 mmol/L 4.4   Chloride 98 - 111 mmol/L 102   CO2 22 - 32 mmol/L 28   Calcium 8.9 - 10.3 mg/dL 9.6   Total Protein 6.5 - 8.1 g/dL 6.7   Total Bilirubin 0.3 - 1.2 mg/dL 0.4   Alkaline Phos 38 - 126 U/L 78   AST 15 - 41 U/L 15   ALT 0 - 44 U/L 12       Latest Ref Rng & Units 04/29/2022   10:15 AM  CBC  WBC 4.0 - 10.5 K/uL 5.6   Hemoglobin 12.0 - 15.0 g/dL 13.5   Hematocrit 36.0 - 46.0 % 40.8   Platelets 150 - 400 K/uL 281       No results found.   Assessment and plan- Patient is a 76 y.o. 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  She is here for on treatment assessment prior to next cycle of Darzalex  Okay to proceed with Darzalex today and she will continue to take her Revlimid 2 weeks on and 1 week off as well.  I will see her back in 4 weeks for the next dose.  On exam today her chin abscess is significantly better although there is still some pus pointing and small amounts of drainage.  She has completed her antibiotic course.  There is some new redness noted on either side of the abscess along the angle of the jaw without any significant fluctuance or local warmth.  It is possible that this might develop into abscess in the future.  I am holding off on giving her any antibiotics at this time and I will reach out to Dr. Tami Ribas.  She  does not have a follow-up with ENT until March 2024.  Myeloma labs appear stable presently.   Visit Diagnosis 1. Multiple myeloma not having achieved remission ([place])   2. Encounter for antineoplastic chemotherapy   3. High risk medication use      Dr. Randa Evens, MD, MPH [facility] [phone] 04/29/2022 7:18 PM

## 2022-04-29 NOTE — Patient Instructions (Signed)
[facility] [place] [facility]  Discharge Instructions: Thank you for choosing [facility] to provide your oncology and hematology care.  If you have a lab appointment with the Cancer Center, please go directly to the Cancer Center and check in at the registration area.  Wear comfortable clothing and clothing appropriate for easy access to any Portacath or PICC line.   We strive to give you quality time with your provider. You may need to reschedule your appointment if you arrive late (15 or more minutes).  Arriving late affects you and other patients whose appointments are after yours.  Also, if you miss three or more appointments without notifying the office, you may be dismissed from the clinic at the provider's discretion.      For prescription refill requests, have your pharmacy contact our office and allow 72 hours for refills to be completed.    Today you received the following chemotherapy and/or immunotherapy agents Darzalex      To help prevent nausea and vomiting after your treatment, we encourage you to take your nausea medication as directed.  BELOW ARE SYMPTOMS THAT SHOULD BE REPORTED IMMEDIATELY: *FEVER GREATER THAN 100.4 F (38 C) OR HIGHER *CHILLS OR SWEATING *NAUSEA AND VOMITING THAT IS NOT CONTROLLED WITH YOUR NAUSEA MEDICATION *UNUSUAL SHORTNESS OF BREATH *UNUSUAL BRUISING OR BLEEDING *URINARY PROBLEMS (pain or burning when urinating, or frequent urination) *BOWEL PROBLEMS (unusual diarrhea, constipation, pain near the anus) TENDERNESS IN MOUTH AND THROAT WITH OR WITHOUT PRESENCE OF ULCERS (sore throat, sores in mouth, or a toothache) UNUSUAL RASH, SWELLING OR PAIN  UNUSUAL VAGINAL DISCHARGE OR ITCHING   Items with * indicate a potential emergency and should be followed up as soon as possible or go to the Emergency Department if any problems should occur.  Please show the CHEMOTHERAPY ALERT CARD or IMMUNOTHERAPY ALERT CARD at check-in to  the Emergency Department and triage nurse.  Should you have questions after your visit or need to cancel or reschedule your appointment, please contact [facility]  [phone] and follow the prompts.  Office hours are 8:00 a.m. to 4:30 p.m. Monday - Friday. Please note that voicemails left after 4:00 p.m. may not be returned until the following business day.  We are closed weekends and major holidays. You have access to a nurse at all times for urgent questions. Please call the main number to the clinic [phone] and follow the prompts.  For any non-urgent questions, you may also contact your provider using MyChart. We now offer e-Visits for anyone 18 and older to request care online for non-urgent symptoms. For details visit [email or web address].   Also download the MyChart app! Go to the app store, search "MyChart", open the app, select [facility], and log in with your MyChart username and password.  Masks are optional in the cancer centers. If you would like for your care team to wear a mask while they are taking care of you, please let them know. For doctor visits, patients may have with them one support person who is at least 74 years old. At this time, visitors are not allowed in the infusion area.   

## 2022-04-29 NOTE — Progress Notes (Signed)
The patient has been taking dexamethasone at home prior to daratumumab injections. After discussion with the provider, the offset time of daratumumab was changed to 30 minutes, starting 05/27/2022, in order to reduce patient wait time. A calendar was not sent to the patient because she has already been taking dexamethasone at home.  Laray Anger, PharmD PGY-2 Pharmacy Resident Hematology/Oncology [phone]  04/29/2022 9:15 AM

## 2022-04-30 LAB — KAPPA/LAMBDA LIGHT CHAINS
Kappa free light chain: 8.1 mg/L (ref 3.3–19.4)
Kappa, lambda light chain ratio: 1.19 (ref 0.26–1.65)
Lambda free light chains: 6.8 mg/L (ref 5.7–26.3)

## 2022-05-01 ENCOUNTER — Other Ambulatory Visit: Payer: Self-pay

## 2022-05-06 LAB — MULTIPLE MYELOMA PANEL, SERUM
Albumin SerPl Elph-Mcnc: 3.8 g/dL (ref 2.9–4.4)
Albumin/Glob SerPl: 1.9 — ABNORMAL HIGH (ref 0.7–1.7)
Alpha 1: 0.2 g/dL (ref 0.0–0.4)
Alpha2 Glob SerPl Elph-Mcnc: 0.7 g/dL (ref 0.4–1.0)
B-Globulin SerPl Elph-Mcnc: 0.8 g/dL (ref 0.7–1.3)
Gamma Glob SerPl Elph-Mcnc: 0.4 g/dL (ref 0.4–1.8)
Globulin, Total: 2.1 g/dL — ABNORMAL LOW (ref 2.2–3.9)
IgA: 30 mg/dL — ABNORMAL LOW (ref 64–422)
IgG (Immunoglobin G), Serum: 309 mg/dL — ABNORMAL LOW (ref 586–1602)
IgM (Immunoglobulin M), Srm: 23 mg/dL — ABNORMAL LOW (ref 26–217)
Total Protein ELP: 5.9 g/dL — ABNORMAL LOW (ref 6.0–8.5)

## 2022-05-12 ENCOUNTER — Other Ambulatory Visit: Payer: Self-pay | Admitting: *Deleted

## 2022-05-12 DIAGNOSIS — C9000 Multiple myeloma not having achieved remission: Secondary | ICD-10-CM

## 2022-05-12 MED ORDER — LENALIDOMIDE 15 MG PO CAPS: 15.0000 mg | ORAL_CAPSULE | Freq: Every day | ORAL | 0 refills | Status: DC

## 2022-05-22 ENCOUNTER — Other Ambulatory Visit: Payer: Self-pay

## 2022-05-27 ENCOUNTER — Encounter: Payer: Self-pay | Admitting: Oncology

## 2022-05-27 ENCOUNTER — Inpatient Hospital Stay (HOSPITAL_BASED_OUTPATIENT_CLINIC_OR_DEPARTMENT_OTHER): Payer: Medicare HMO | Admitting: Oncology

## 2022-05-27 ENCOUNTER — Telehealth: Payer: Self-pay | Admitting: *Deleted

## 2022-05-27 ENCOUNTER — Inpatient Hospital Stay: Payer: Medicare HMO

## 2022-05-27 ENCOUNTER — Inpatient Hospital Stay: Payer: Medicare HMO | Attending: Oncology

## 2022-05-27 VITALS — BP 141/62 | HR 96 | Resp 18 | Wt 175.0 lb

## 2022-05-27 DIAGNOSIS — Z5112 Encounter for antineoplastic immunotherapy: Secondary | ICD-10-CM | POA: Insufficient documentation

## 2022-05-27 DIAGNOSIS — C9000 Multiple myeloma not having achieved remission: Secondary | ICD-10-CM | POA: Insufficient documentation

## 2022-05-27 DIAGNOSIS — E538 Deficiency of other specified B group vitamins: Secondary | ICD-10-CM | POA: Diagnosis present

## 2022-05-27 DIAGNOSIS — Z5111 Encounter for antineoplastic chemotherapy: Secondary | ICD-10-CM | POA: Diagnosis not present

## 2022-05-27 LAB — CBC WITH DIFFERENTIAL/PLATELET
Abs Immature Granulocytes: 0.02 10*3/uL (ref 0.00–0.07)
Basophils Absolute: 0.1 10*3/uL (ref 0.0–0.1)
Basophils Relative: 1 %
Eosinophils Absolute: 0.3 10*3/uL (ref 0.0–0.5)
Eosinophils Relative: 6 %
HCT: 39 % (ref 36.0–46.0)
Hemoglobin: 12.9 g/dL (ref 12.0–15.0)
Immature Granulocytes: 0 %
Lymphocytes Relative: 12 %
Lymphs Abs: 0.7 10*3/uL (ref 0.7–4.0)
MCH: 30.6 pg (ref 26.0–34.0)
MCHC: 33.1 g/dL (ref 30.0–36.0)
MCV: 92.6 fL (ref 80.0–100.0)
Monocytes Absolute: 0.6 10*3/uL (ref 0.1–1.0)
Monocytes Relative: 10 %
Neutro Abs: 3.9 10*3/uL (ref 1.7–7.7)
Neutrophils Relative %: 71 %
Platelets: 294 10*3/uL (ref 150–400)
RBC: 4.21 MIL/uL (ref 3.87–5.11)
RDW: 13.3 % (ref 11.5–15.5)
WBC: 5.6 10*3/uL (ref 4.0–10.5)
nRBC: 0 % (ref 0.0–0.2)

## 2022-05-27 MED ORDER — DIPHENHYDRAMINE HCL 25 MG PO CAPS: 50.0000 mg | ORAL_CAPSULE | Freq: Once | ORAL | Status: DC

## 2022-05-27 MED ORDER — DARATUMUMAB-HYALURONIDASE-FIHJ 1800-30000 MG-UT/15ML ~~LOC~~ SOLN
1800.0000 mg | Freq: Once | SUBCUTANEOUS | Status: AC
  Administered 2022-05-27: 1800 mg via SUBCUTANEOUS
  Filled 2022-05-27: qty 15

## 2022-05-27 NOTE — Progress Notes (Signed)
Right eye cataract - will do surgery  Still having some drainage from jaw surgery.

## 2022-05-27 NOTE — Telephone Encounter (Signed)
I got a call back and was told that Dr. Shary Decamp was her surgeon and gave me the phone number [phone] and I called there and went to voicemail and I left message about her drainage in 3 different spots at her chin. Dr. Janese Banks seems like to her like sinus tract of  drainage but it is just not the sinus it is chin. She has appt March 7 and dr Janese Banks request to please make a sooner appt due to the drainage. Left my direct number to call back

## 2022-05-27 NOTE — Patient Instructions (Signed)
[facility] [facility]  Discharge Instructions: Thank you for choosing [facility] to provide your oncology and hematology care.  If you have a lab appointment with the Cancer Center, please go directly to the Cancer Center and check in at the registration area.  Wear comfortable clothing and clothing appropriate for easy access to any Portacath or PICC line.   We strive to give you quality time with your provider. You may need to reschedule your appointment if you arrive late (15 or more minutes).  Arriving late affects you and other patients whose appointments are after yours.  Also, if you miss three or more appointments without notifying the office, you may be dismissed from the clinic at the provider's discretion.      For prescription refill requests, have your pharmacy contact our office and allow 72 hours for refills to be completed.    Today you received the following chemotherapy and/or immunotherapy agents DARZALEX      To help prevent nausea and vomiting after your treatment, we encourage you to take your nausea medication as directed.  BELOW ARE SYMPTOMS THAT SHOULD BE REPORTED IMMEDIATELY: *FEVER GREATER THAN 100.4 F (38 C) OR HIGHER *CHILLS OR SWEATING *NAUSEA AND VOMITING THAT IS NOT CONTROLLED WITH YOUR NAUSEA MEDICATION *UNUSUAL SHORTNESS OF BREATH *UNUSUAL BRUISING OR BLEEDING *URINARY PROBLEMS (pain or burning when urinating, or frequent urination) *BOWEL PROBLEMS (unusual diarrhea, constipation, pain near the anus) TENDERNESS IN MOUTH AND THROAT WITH OR WITHOUT PRESENCE OF ULCERS (sore throat, sores in mouth, or a toothache) UNUSUAL RASH, SWELLING OR PAIN  UNUSUAL VAGINAL DISCHARGE OR ITCHING   Items with * indicate a potential emergency and should be followed up as soon as possible or go to the Emergency Department if any problems should occur.  Please show the CHEMOTHERAPY ALERT CARD or IMMUNOTHERAPY ALERT CARD at check-in to  the Emergency Department and triage nurse.  Should you have questions after your visit or need to cancel or reschedule your appointment, please contact [facility] [place] [facility]  [phone] and follow the prompts.  Office hours are 8:00 a.m. to 4:30 p.m. Monday - Friday. Please note that voicemails left after 4:00 p.m. may not be returned until the following business day.  We are closed weekends and major holidays. You have access to a nurse at all times for urgent questions. Please call the main number to the clinic [phone] and follow the prompts.  For any non-urgent questions, you may also contact your provider using MyChart. We now offer e-Visits for anyone 18 and older to request care online for non-urgent symptoms. For details visit [email or web address].   Also download the MyChart app! Go to the app store, search "MyChart", open the app, select [facility], and log in with your MyChart username and password.  Daratumumab Injection What is this medication? DARATUMUMAB (dar a toom ue mab) treats multiple myeloma, a type of bone marrow cancer. It works by helping your immune system slow or stop the spread of cancer cells. It is a monoclonal antibody. This medicine may be used for other purposes; ask your health care provider or pharmacist if you have questions. COMMON BRAND NAME(S): DARZALEX What should I tell my care team before I take this medication? They need to know if you have any of these conditions: Hereditary fructose intolerance Infection, such as chickenpox, herpes, hepatitis B virus Lung or breathing disease, such as asthma, COPD An unusual or allergic reaction to daratumumab, sorbitol,   other medications, foods, dyes, or preservatives Pregnant or trying to get pregnant Breast-feeding How should I use this medication? This medication is injected into a vein. It is given by your care team in a hospital or clinic setting. Talk to your care team about  the use of this medication in children. Special care may be needed. Overdosage: If you think you have taken too much of this medicine contact a poison control center or emergency room at once. NOTE: This medicine is only for you. Do not share this medicine with others. What if I miss a dose? Keep appointments for follow-up doses. It is important not to miss your dose. Call your care team if you are unable to keep an appointment. What may interact with this medication? Interactions have not been studied. This list may not describe all possible interactions. Give your health care provider a list of all the medicines, herbs, non-prescription drugs, or dietary supplements you use. Also tell them if you smoke, drink alcohol, or use illegal drugs. Some items may interact with your medicine. What should I watch for while using this medication? Your condition will be monitored carefully while you are receiving this medication. This medication can cause serious allergic reactions. To reduce your risk, your care team may give you other medication to take before receiving this one. Be sure to follow the directions from your care team. This medication can affect the results of blood tests to match your blood type. These changes can last for up to 6 months after the final dose. Your care team will do blood tests to match your blood type before you start treatment. Tell all of your care team that you are being treated with this medication before receiving a blood transfusion. This medication can affect the results of some tests used to determine treatment response; extra tests may be needed to evaluate response. Talk to your care team if you wish to become pregnant or think you are pregnant. This medication can cause serious birth defects if taken during pregnancy and for 3 months after the last dose. A reliable form of contraception is recommended while taking this medication and for 3 months after the last dose.  Talk to your care team about effective forms of contraception. Do not breast-feed while taking this medication. What side effects may I notice from receiving this medication? Side effects that you should report to your care team as soon as possible: Allergic reactions--skin rash, itching, hives, swelling of the face, lips, tongue, or throat Infection--fever, chills, cough, sore throat, wounds that don't heal, pain or trouble when passing urine, general feeling of discomfort or being unwell Infusion reactions--chest pain, shortness of breath or trouble breathing, feeling faint or lightheaded Unusual bruising or bleeding Side effects that usually do not require medical attention (report to your care team if they continue or are bothersome): Constipation Diarrhea Fatigue Nausea Pain, tingling, or numbness in the hands or feet Swelling of the ankles, hands, or feet This list may not describe all possible side effects. Call your doctor for medical advice about side effects. You may report side effects to FDA at 1-800-FDA-1088. Where should I keep my medication? This medication is given in a hospital or clinic. It will not be stored at home. NOTE: This sheet is a summary. It may not cover all possible information. If you have questions about this medicine, talk to your doctor, pharmacist, or health care provider.  2023 Elsevier/Gold Standard (2021-08-13 00:00:00)     

## 2022-05-27 NOTE — Progress Notes (Signed)
Hematology/Oncology Consult note [facility]  Telephone:([phone] [phone]  Patient Care Team: Denton Lank, MD as PCP - General (Family Medicine) Sindy Guadeloupe, MD as Consulting Physician (Hematology and Oncology)   Name of the patient: Laurie [name]  [id number]  February 06, 1946   Date of visit: 05/27/22  Diagnosis- stage III biclonal IgA multiple myeloma     Chief complaint/ Reason for visit-on treatment assessment prior to next cycle of Darzalex  Heme/Onc history: Patient is a 77 year old female who was diagnosed with stage III biclonal IgA multiple myeloma in June 2021. SPEP was 5.0 gm/dL on 10/03/2019. Random urine revealed 196.6 mg/dL total protein with 58.3% M-spike.  Bone marrow biopsy on 11/13/2019 revealed a hypercellular bone marrow with extensive involvement by plasma cell neoplasm. Atypical plasma cells represented 70% of all cells in the aspirate and was associated with prominent interstitial infiltrates and diffuse sheets in the clot.  Plasma cells were kappa light chain restricted.   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   Bone survey on 10/26/2019 revealed small lytic lesions in the skull. There was no acute fracture.   Patient has been on daratumumab Revlimid dexamethasone regimen since July 2021.  She briefly received Retacrit in the past but has not required it for over 1 year now.   Patient is a Sales promotion account executive [employer]    Interval history-patient has 3 areas of drainage through her chin.  Denies any fevers.  ECOG PS- 1 Pain scale- 0   Review of systems- Review of Systems  Constitutional:  Positive for malaise/fatigue. Negative for chills, fever and weight  loss.  HENT:  Negative for congestion, ear discharge and nosebleeds.   Eyes:  Negative for blurred vision.  Respiratory:  Negative for cough, hemoptysis, sputum production, shortness of breath and wheezing.   Cardiovascular:  Negative for chest pain, palpitations, orthopnea and claudication.  Gastrointestinal:  Negative for abdominal pain, blood in stool, constipation, diarrhea, heartburn, melena, nausea and vomiting.  Genitourinary:  Negative for dysuria, flank pain, frequency, hematuria and urgency.  Musculoskeletal:  Negative for back pain, joint pain and myalgias.  Skin:  Negative for rash.  Neurological:  Negative for dizziness, tingling, focal weakness, seizures, weakness and headaches.  Endo/Heme/Allergies:  Does not bruise/bleed easily.  Psychiatric/Behavioral:  Negative for depression and suicidal ideas. The patient does not have insomnia.       Allergies  Allergen Reactions   Levofloxacin     Critical      Past Medical History:  Diagnosis Date   Anxiety    Asthma    Cellulitis and abscess of oral soft tissues    Cellulitis and abscess of oral soft tissues 03/31/2021   pt in hospital and now out as outpt   Depression    Head injury    Multiple myeloma ([place])    Osteoporosis      Past Surgical History:  Procedure Laterality Date   ABDOMINAL HYSTERECTOMY     HEMORROIDECTOMY     MOUTH SURGERY     SEPTOPLASTY      Social History   Socioeconomic History   Marital status: Widowed    Spouse name: Not on file   Number of children: Not on file   Years of education: Not  on file   Highest education level: Not on file  Occupational History   Not on file  Tobacco Use   Smoking status: Never   Smokeless tobacco: Never  Vaping Use   Vaping Use: Never used  Substance and Sexual Activity   Alcohol use: No   Drug use: No   Sexual activity: Not Currently  Other Topics Concern   Not on file  Social History Narrative   Jehovah Witness    Social Determinants of  Health   Financial Resource Strain: Not on file  Food Insecurity: Not on file  Transportation Needs: Not on file  Physical Activity: Not on file  Stress: Not on file  Social Connections: Not on file  Intimate Partner Violence: Not on file    Family History  Problem Relation Age of Onset   Prostate cancer Brother    Throat cancer Maternal Aunt    Breast cancer Cousin        mat cousin   Breast cancer Cousin    Thyroid cancer Other      Current Outpatient Medications:    acetaminophen (TYLENOL) 325 MG tablet, Take 650 mg by mouth every 8 (eight) hours as needed for mild pain., Disp: , Rfl:    acyclovir (ZOVIRAX) 400 MG tablet, Take 1 tablet (400 mg total) by mouth 2 (two) times daily., Disp: 180 tablet, Rfl: 3   albuterol (VENTOLIN HFA) 108 (90 Base) MCG/ACT inhaler, 2 inhalations every 4 (four) hours as needed., Disp: , Rfl:    aspirin 81 MG chewable tablet, Chew by mouth daily., Disp: , Rfl:    atorvastatin (LIPITOR) 20 MG tablet, Take 20 mg by mouth daily., Disp: , Rfl:    busPIRone (BUSPAR) 10 MG tablet, Take 10 mg by mouth 2 (two) times daily., Disp: , Rfl:    Calcium Carb-Cholecalciferol (CALCIUM 600 + D PO), Take 600 mg by mouth 4 (four) times daily. Increase calcium from '600mg'$  4 times a day to '600mg'$  5 times a day starting today, Disp: , Rfl:    chlorhexidine (PERIDEX) 0.12 % solution, 15 mLs 2 (two) times daily., Disp: , Rfl:    dexamethasone (DECADRON) 4 MG tablet, Take 5 tablets (20 mg total) by mouth once a week., Disp: 120 tablet, Rfl: 2   docusate sodium (COLACE) 100 MG capsule, Take 100 mg by mouth 2 (two) times daily., Disp: , Rfl:    lenalidomide (REVLIMID) 15 MG capsule, Take 1 capsule (15 mg total) by mouth daily. Take for 21 days, then hold for 7 days. Repeat every 28 days. Obtained on 19/2024, REMS: [id number], Disp: 21 capsule, Rfl: 0   montelukast (SINGULAIR) 10 MG tablet, Take 10 mg by mouth daily after breakfast., Disp: , Rfl:    nortriptyline (PAMELOR) 25 MG  capsule, Take 25 mg by mouth at bedtime., Disp: , Rfl:    omeprazole (PRILOSEC) 20 MG capsule, Take 20 mg by mouth daily., Disp: , Rfl:    ondansetron (ZOFRAN) 8 MG tablet, Take 8 mg by mouth every 8 (eight) hours as needed., Disp: , Rfl:    pentoxifylline (TRENTAL) 400 MG CR tablet, Take 400 mg by mouth 2 (two) times daily., Disp: , Rfl:    sertraline (ZOLOFT) 100 MG tablet, Take 100 mg by mouth 2 (two) times daily., Disp: , Rfl:    traZODone (DESYREL) 50 MG tablet, Take 50 mg by mouth at bedtime., Disp: , Rfl:    Vitamin D, Ergocalciferol, (DRISDOL) 50000 UNITS CAPS capsule, Take 50,000 Units by mouth  every 30 (thirty) days. Once a month, Disp: , Rfl:    vitamin E 1000 UNIT capsule, Take 1,000 Units by mouth daily., Disp: , Rfl:  No current facility-administered medications for this visit.  Facility-Administered Medications Ordered in Other Visits:    diphenhydrAMINE (BENADRYL) capsule 50 mg, 50 mg, Oral, Once, Sindy Guadeloupe, MD  Physical exam:  Vitals:   05/27/22 0908 05/27/22 0918  BP:  (!) 141/62  Pulse:  96  Resp:  18  SpO2:  97%  Weight: 175 lb (79.4 kg)    Physical Exam HENT:     Head:     Comments: There appear to be 3 areas of sinus tract formation and serous discharge noted on the undersurface of the chin Cardiovascular:     Rate and Rhythm: Normal rate and regular rhythm.     Heart sounds: Normal heart sounds.  Pulmonary:     Effort: Pulmonary effort is normal.     Breath sounds: Normal breath sounds.  Abdominal:     General: Bowel sounds are normal.     Palpations: Abdomen is soft.  Skin:    General: Skin is warm and dry.  Neurological:     Mental Status: She is alert and oriented to person, place, and time.         Latest Ref Rng & Units 02/03/2022    9:18 AM  CMP  Glucose 70 - 99 mg/dL 108   BUN 8 - 23 mg/dL 21   Creatinine 0.44 - 1.00 mg/dL 1.03   Sodium 135 - 145 mmol/L 138   Potassium 3.5 - 5.1 mmol/L 4.4   Chloride 98 - 111 mmol/L 102   CO2 22 -  32 mmol/L 28   Calcium 8.9 - 10.3 mg/dL 9.6   Total Protein 6.5 - 8.1 g/dL 6.7   Total Bilirubin 0.3 - 1.2 mg/dL 0.4   Alkaline Phos 38 - 126 U/L 78   AST 15 - 41 U/L 15   ALT 0 - 44 U/L 12       Latest Ref Rng & Units 05/27/2022    8:41 AM  CBC  WBC 4.0 - 10.5 K/uL 5.6   Hemoglobin 12.0 - 15.0 g/dL 12.9   Hematocrit 36.0 - 46.0 % 39.0   Platelets 150 - 400 K/uL 294         Assessment and plan- Patient is a 77 y.o. 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  She is here for on treatment assessment prior to next cycle of Darzalex  Counts okay to proceed with Darzalex today but have asked her to hold her Revlimid at this point given that she has sinus tract formation with discharge on the undersurface of the chin.  I suspect she has a deep-seated oral infection which is making its way outside in the form of sinus tract formation.  I did reach out to Dr. Tami Ribas but he recommended seeing Dr. Patrina Levering who is patient's oral surgeon at [facility].  She has an appointment with him in March 2024 but I think it would be prudent for the patient to see him sooner and we will reach out to him again.  Myeloma labs so far have remained stable.  I will see her back in 4 weeks for the next cycle of Darzalex   Visit Diagnosis 1. Multiple myeloma not having achieved remission ([place])   2. Encounter for antineoplastic chemotherapy      Dr. Randa Evens, MD, MPH [facility] at [facility]  [facility] [phone] 05/27/2022 3:38 PM

## 2022-05-27 NOTE — Telephone Encounter (Signed)
I left message with dental  doctor Dr. Lewanda Rife. To please move up hte date from march to as soon as possible because of the 3 draining areas under the chin. I asked on my message could they please call me back about pt

## 2022-05-28 ENCOUNTER — Other Ambulatory Visit: Payer: Self-pay

## 2022-05-28 LAB — KAPPA/LAMBDA LIGHT CHAINS
Kappa free light chain: 6.8 mg/L (ref 3.3–19.4)
Kappa, lambda light chain ratio: 1.19 (ref 0.26–1.65)
Lambda free light chains: 5.7 mg/L (ref 5.7–26.3)

## 2022-06-01 LAB — MULTIPLE MYELOMA PANEL, SERUM
Albumin SerPl Elph-Mcnc: 3.3 g/dL (ref 2.9–4.4)
Albumin/Glob SerPl: 1.7 (ref 0.7–1.7)
Alpha 1: 0.3 g/dL (ref 0.0–0.4)
Alpha2 Glob SerPl Elph-Mcnc: 0.7 g/dL (ref 0.4–1.0)
B-Globulin SerPl Elph-Mcnc: 0.8 g/dL (ref 0.7–1.3)
Gamma Glob SerPl Elph-Mcnc: 0.3 g/dL — ABNORMAL LOW (ref 0.4–1.8)
Globulin, Total: 2 g/dL — ABNORMAL LOW (ref 2.2–3.9)
IgA: 33 mg/dL — ABNORMAL LOW (ref 64–422)
IgG (Immunoglobin G), Serum: 282 mg/dL — ABNORMAL LOW (ref 586–1602)
IgM (Immunoglobulin M), Srm: 16 mg/dL — ABNORMAL LOW (ref 26–217)
Total Protein ELP: 5.3 g/dL — ABNORMAL LOW (ref 6.0–8.5)

## 2022-06-02 ENCOUNTER — Other Ambulatory Visit: Payer: Self-pay

## 2022-06-08 ENCOUNTER — Other Ambulatory Visit: Payer: Self-pay

## 2022-06-15 ENCOUNTER — Other Ambulatory Visit: Payer: Self-pay | Admitting: *Deleted

## 2022-06-15 DIAGNOSIS — C9000 Multiple myeloma not having achieved remission: Secondary | ICD-10-CM

## 2022-06-24 ENCOUNTER — Inpatient Hospital Stay: Payer: Medicare HMO

## 2022-06-24 ENCOUNTER — Inpatient Hospital Stay (HOSPITAL_BASED_OUTPATIENT_CLINIC_OR_DEPARTMENT_OTHER): Payer: Medicare HMO | Admitting: Oncology

## 2022-06-24 ENCOUNTER — Encounter: Payer: Self-pay | Admitting: Oncology

## 2022-06-24 ENCOUNTER — Inpatient Hospital Stay: Payer: Medicare HMO | Attending: Oncology

## 2022-06-24 VITALS — BP 145/91 | HR 102 | Temp 95.3°F | Resp 18 | Ht 63.0 in | Wt 171.8 lb

## 2022-06-24 DIAGNOSIS — Z79899 Other long term (current) drug therapy: Secondary | ICD-10-CM | POA: Insufficient documentation

## 2022-06-24 DIAGNOSIS — C9000 Multiple myeloma not having achieved remission: Secondary | ICD-10-CM

## 2022-06-24 DIAGNOSIS — Z5112 Encounter for antineoplastic immunotherapy: Secondary | ICD-10-CM | POA: Diagnosis present

## 2022-06-24 DIAGNOSIS — Z7982 Long term (current) use of aspirin: Secondary | ICD-10-CM | POA: Insufficient documentation

## 2022-06-24 DIAGNOSIS — Z90710 Acquired absence of both cervix and uterus: Secondary | ICD-10-CM | POA: Diagnosis not present

## 2022-06-24 DIAGNOSIS — Z5111 Encounter for antineoplastic chemotherapy: Secondary | ICD-10-CM | POA: Diagnosis not present

## 2022-06-24 DIAGNOSIS — E538 Deficiency of other specified B group vitamins: Secondary | ICD-10-CM | POA: Insufficient documentation

## 2022-06-24 DIAGNOSIS — Z79624 Long term (current) use of inhibitors of nucleotide synthesis: Secondary | ICD-10-CM | POA: Diagnosis not present

## 2022-06-24 LAB — CBC WITH DIFFERENTIAL/PLATELET
Abs Immature Granulocytes: 0.04 10*3/uL (ref 0.00–0.07)
Basophils Absolute: 0 10*3/uL (ref 0.0–0.1)
Basophils Relative: 1 %
Eosinophils Absolute: 0.3 10*3/uL (ref 0.0–0.5)
Eosinophils Relative: 3 %
HCT: 44.1 % (ref 36.0–46.0)
Hemoglobin: 14.4 g/dL (ref 12.0–15.0)
Immature Granulocytes: 1 %
Lymphocytes Relative: 13 %
Lymphs Abs: 1.1 10*3/uL (ref 0.7–4.0)
MCH: 30.3 pg (ref 26.0–34.0)
MCHC: 32.7 g/dL (ref 30.0–36.0)
MCV: 92.8 fL (ref 80.0–100.0)
Monocytes Absolute: 0.6 10*3/uL (ref 0.1–1.0)
Monocytes Relative: 7 %
Neutro Abs: 6.4 10*3/uL (ref 1.7–7.7)
Neutrophils Relative %: 75 %
Platelets: 286 10*3/uL (ref 150–400)
RBC: 4.75 MIL/uL (ref 3.87–5.11)
RDW: 13.9 % (ref 11.5–15.5)
WBC: 8.5 10*3/uL (ref 4.0–10.5)
nRBC: 0 % (ref 0.0–0.2)

## 2022-06-24 MED ORDER — DIPHENHYDRAMINE HCL 25 MG PO CAPS: 50.0000 mg | ORAL_CAPSULE | Freq: Once | ORAL | Status: DC

## 2022-06-24 MED ORDER — DARATUMUMAB-HYALURONIDASE-FIHJ 1800-30000 MG-UT/15ML ~~LOC~~ SOLN
1800.0000 mg | Freq: Once | SUBCUTANEOUS | Status: AC
  Administered 2022-06-24: 1800 mg via SUBCUTANEOUS
  Filled 2022-06-24: qty 15

## 2022-06-24 NOTE — Progress Notes (Signed)
Patient states that she was charged for infusion 1/24/ 24 and she did not get infusion.

## 2022-06-24 NOTE — Patient Instructions (Signed)
[facility]  Discharge Instructions: Thank you for choosing [facility] to provide your oncology and hematology care.  If you have a lab appointment with the [facility], please go directly to the [facility] and check in at the registration area.  Wear comfortable clothing and clothing appropriate for easy access to any Portacath or PICC line.   We strive to give you quality time with your provider. You may need to reschedule your appointment if you arrive late (15 or more minutes).  Arriving late affects you and other patients whose appointments are after yours.  Also, if you miss three or more appointments without notifying the office, you may be dismissed from the clinic at the provider's discretion.      For prescription refill requests, have your pharmacy contact our office and allow 72 hours for refills to be completed.    Today you received the following chemotherapy and/or immunotherapy agents Darzalex      To help prevent nausea and vomiting after your treatment, we encourage you to take your nausea medication as directed.  BELOW ARE SYMPTOMS THAT SHOULD BE REPORTED IMMEDIATELY: *FEVER GREATER THAN 100.4 F (38 C) OR HIGHER *CHILLS OR SWEATING *NAUSEA AND VOMITING THAT IS NOT CONTROLLED WITH YOUR NAUSEA MEDICATION *UNUSUAL SHORTNESS OF BREATH *UNUSUAL BRUISING OR BLEEDING *URINARY PROBLEMS (pain or burning when urinating, or frequent urination) *BOWEL PROBLEMS (unusual diarrhea, constipation, pain near the anus) TENDERNESS IN MOUTH AND THROAT WITH OR WITHOUT PRESENCE OF ULCERS (sore throat, sores in mouth, or a toothache) UNUSUAL RASH, SWELLING OR PAIN  UNUSUAL VAGINAL DISCHARGE OR ITCHING   Items with * indicate a potential emergency and should be followed up as soon as possible or go to the Emergency Department if any problems should occur.  Please show the CHEMOTHERAPY ALERT CARD or IMMUNOTHERAPY ALERT CARD at check-in to  the Emergency Department and triage nurse.  Should you have questions after your visit or need to cancel or reschedule your appointment, please contact [facility]  [phone] and follow the prompts.  Office hours are 8:00 a.m. to 4:30 p.m. Monday - Friday. Please note that voicemails left after 4:00 p.m. may not be returned until the following business day.  We are closed weekends and major holidays. You have access to a nurse at all times for urgent questions. Please call the main number to the clinic [phone] and follow the prompts.  For any non-urgent questions, you may also contact your provider using MyChart. We now offer e-Visits for anyone 65 and older to request care online for non-urgent symptoms. For details visit [email or web address].   Also download the MyChart app! Go to the app store, search "MyChart", open the app, select [facility], and log in with your MyChart username and password.

## 2022-06-24 NOTE — Progress Notes (Signed)
Hematology/Oncology Consult note [facility]  [phone] [phone]  Patient Care Team: Denton Lank, MD as PCP - General (Family Medicine) Sindy Guadeloupe, MD as Consulting Physician (Hematology and Oncology)   Name of the patient: Laurie [name]  [id number]  05/23/1945   Date of visit: 06/24/22  Diagnosis- stage III biclonal IgA multiple myeloma    Chief complaint/ Reason for visit-on treatment assessment prior to next cycle of Darzalex  Heme/Onc history: Patient is a 77 year old female who was diagnosed with stage III biclonal IgA multiple myeloma in June 2021. SPEP was 5.0 gm/dL on 10/03/2019. Random urine revealed 196.6 mg/dL total protein with 58.3% M-spike.  Bone marrow biopsy on 11/13/2019 revealed a hypercellular bone marrow with extensive involvement by plasma cell neoplasm. Atypical plasma cells represented 70% of all cells in the aspirate and was associated with prominent interstitial infiltrates and diffuse sheets in the clot.  Plasma cells were kappa light chain restricted.   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   Bone survey on 10/26/2019 revealed small lytic lesions in the skull. There was no acute fracture.   Patient has been on daratumumab Revlimid dexamethasone regimen since July 2021.  She briefly received Retacrit in the past but has not required it for over 1 year now.   Patient is a Sales promotion account executive Witness  Interval history-3 sinus tracts on the undersurface of the chin continues to drain intermittently.  No new complaints.  She has an appointment with Dr. Shary Decamp next week  ECOG PS- 1 Pain scale- 0   Review of systems- Review of Systems   Constitutional:  Positive for malaise/fatigue. Negative for chills, fever and weight loss.  HENT:  Negative for congestion, ear discharge and nosebleeds.   Eyes:  Negative for blurred vision.  Respiratory:  Negative for cough, hemoptysis, sputum production, shortness of breath and wheezing.   Cardiovascular:  Negative for chest pain, palpitations, orthopnea and claudication.  Gastrointestinal:  Negative for abdominal pain, blood in stool, constipation, diarrhea, heartburn, melena, nausea and vomiting.  Genitourinary:  Negative for dysuria, flank pain, frequency, hematuria and urgency.  Musculoskeletal:  Negative for back pain, joint pain and myalgias.  Skin:  Negative for rash.  Neurological:  Negative for dizziness, tingling, focal weakness, seizures, weakness and headaches.  Endo/Heme/Allergies:  Does not bruise/bleed easily.  Psychiatric/Behavioral:  Negative for depression and suicidal ideas. The patient does not have insomnia.       Allergies  Allergen Reactions   Levofloxacin     Critical      Past Medical History:  Diagnosis Date   Anxiety    Asthma    Cellulitis and abscess of oral soft tissues    Cellulitis and abscess of oral soft tissues 03/31/2021   pt in hospital and now out as outpt   Depression    Head injury    Multiple myeloma ([facility])    Osteoporosis      Past Surgical History:  Procedure Laterality Date   ABDOMINAL HYSTERECTOMY     HEMORROIDECTOMY     MOUTH SURGERY     SEPTOPLASTY      Social History   Socioeconomic History   Marital status: Widowed    Spouse name: Not on file   Number  of children: Not on file   Years of education: Not on file   Highest education level: Not on file  Occupational History   Not on file  Tobacco Use   Smoking status: Never   Smokeless tobacco: Never  Vaping Use   Vaping Use: Never used  Substance and Sexual Activity   Alcohol use: No   Drug use: No   Sexual activity: Not Currently  Other Topics Concern    Not on file  Social History Narrative   Jehovah Witness    Social Determinants of Health   Financial Resource Strain: Not on file  Food Insecurity: Not on file  Transportation Needs: Not on file  Physical Activity: Not on file  Stress: Not on file  Social Connections: Not on file  Intimate Partner Violence: Not on file    Family History  Problem Relation Age of Onset   Prostate cancer Brother    Throat cancer Maternal Aunt    Breast cancer Cousin        mat cousin   Breast cancer Cousin    Thyroid cancer Other      Current Outpatient Medications:    acyclovir (ZOVIRAX) 400 MG tablet, Take 1 tablet (400 mg total) by mouth 2 (two) times daily., Disp: 180 tablet, Rfl: 3   albuterol (VENTOLIN HFA) 108 (90 Base) MCG/ACT inhaler, 2 inhalations every 4 (four) hours as needed., Disp: , Rfl:    aspirin 81 MG chewable tablet, Chew by mouth daily., Disp: , Rfl:    atorvastatin (LIPITOR) 20 MG tablet, Take 20 mg by mouth daily., Disp: , Rfl:    busPIRone (BUSPAR) 10 MG tablet, Take 10 mg by mouth 2 (two) times daily., Disp: , Rfl:    Calcium Carb-Cholecalciferol (CALCIUM 600 + D PO), Take 600 mg by mouth 4 (four) times daily. Increase calcium from 672m 4 times a day to 6068m5 times a day starting today, Disp: , Rfl:    chlorhexidine (PERIDEX) 0.12 % solution, 15 mLs 2 (two) times daily., Disp: , Rfl:    dexamethasone (DECADRON) 4 MG tablet, Take 5 tablets (20 mg total) by mouth once a week., Disp: 120 tablet, Rfl: 2   docusate sodium (COLACE) 100 MG capsule, Take 100 mg by mouth 2 (two) times daily., Disp: , Rfl:    lenalidomide (REVLIMID) 15 MG capsule, Take 1 capsule (15 mg total) by mouth daily. Take for 21 days, then hold for 7 days. Repeat every 28 days. Obtained on 19/2024, REMS: [id number]: 21 capsule, Rfl: 0   montelukast (SINGULAIR) 10 MG tablet, Take 10 mg by mouth daily after breakfast., Disp: , Rfl:    nortriptyline (PAMELOR) 25 MG capsule, Take 25 mg by mouth at bedtime.,  Disp: , Rfl:    nortriptyline (PAMELOR) 50 MG capsule, Take 50 mg by mouth at bedtime., Disp: , Rfl:    omeprazole (PRILOSEC) 20 MG capsule, Take 20 mg by mouth daily., Disp: , Rfl:    ondansetron (ZOFRAN) 8 MG tablet, Take 8 mg by mouth every 8 (eight) hours as needed., Disp: , Rfl:    pentoxifylline (TRENTAL) 400 MG CR tablet, Take 400 mg by mouth 2 (two) times daily., Disp: , Rfl:    sertraline (ZOLOFT) 100 MG tablet, Take 100 mg by mouth 2 (two) times daily., Disp: , Rfl:    traZODone (DESYREL) 50 MG tablet, Take 50 mg by mouth at bedtime., Disp: , Rfl:    Vitamin D, Ergocalciferol, (DRISDOL) 50000 UNITS CAPS capsule, Take  50,000 Units by mouth every 30 (thirty) days. Once a month, Disp: , Rfl:    vitamin E 1000 UNIT capsule, Take 1,000 Units by mouth daily., Disp: , Rfl:    acetaminophen (TYLENOL) 325 MG tablet, Take 650 mg by mouth every 8 (eight) hours as needed for mild pain. (Patient not taking: Reported on 06/24/2022), Disp: , Rfl:  No current facility-administered medications for this visit.  Facility-Administered Medications Ordered in Other Visits:    daratumumab-hyaluronidase-fihj (DARZALEX FASPRO) 1800-30000 MG-UT/15ML chemo SQ injection 1,800 mg, 1,800 mg, Subcutaneous, Once, Sindy Guadeloupe, MD   diphenhydrAMINE (BENADRYL) capsule 50 mg, 50 mg, Oral, Once, Sindy Guadeloupe, MD  Physical exam:  Vitals:   06/24/22 0915  BP: (!) 145/91  Pulse: (!) 102  Resp: 18  Temp: (!) 95.3 F (35.2 C)  TempSrc: Tympanic  SpO2: 95%  Weight: 171 lb 12.8 oz (77.9 kg)  Height: 5' 3"$  (1.6 m)   Physical Exam HENT:     Mouth/Throat:     Comments: 3 sinus tracts are noted over the undersurface of the chin with minimal serous discharge Cardiovascular:     Rate and Rhythm: Normal rate and regular rhythm.     Heart sounds: Normal heart sounds.  Pulmonary:     Effort: Pulmonary effort is normal.     Breath sounds: Normal breath sounds.  Abdominal:     General: Bowel sounds are normal.      Palpations: Abdomen is soft.  Skin:    General: Skin is warm and dry.  Neurological:     Mental Status: She is alert and oriented to person, place, and time.         Latest Ref Rng & Units 02/03/2022    9:18 AM  CMP  Glucose 70 - 99 mg/dL 108   BUN 8 - 23 mg/dL 21   Creatinine 0.44 - 1.00 mg/dL 1.03   Sodium 135 - 145 mmol/L 138   Potassium 3.5 - 5.1 mmol/L 4.4   Chloride 98 - 111 mmol/L 102   CO2 22 - 32 mmol/L 28   Calcium 8.9 - 10.3 mg/dL 9.6   Total Protein 6.5 - 8.1 g/dL 6.7   Total Bilirubin 0.3 - 1.2 mg/dL 0.4   Alkaline Phos 38 - 126 U/L 78   AST 15 - 41 U/L 15   ALT 0 - 44 U/L 12       Latest Ref Rng & Units 06/24/2022    8:53 AM  CBC  WBC 4.0 - 10.5 K/uL 8.5   Hemoglobin 12.0 - 15.0 g/dL 14.4   Hematocrit 36.0 - 46.0 % 44.1   Platelets 150 - 400 K/uL 286      Assessment and plan- Patient is a 77 y.o. 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  She is here for on treatment assessment prior to next cycle of Darzalex.    Counts okay to proceed with Darzalex today.  She will continue to hold her Revlimid until she gets seen by Dr. Patrina Levering next week.  She has had persistent sinus tracts on the undersurface of her chin with intermittent drainage.  Overall myeloma numbers are stable.  Free light chain ratio is normal and no M protein detected on SPEP.  Continue aspirin and acyclovir prophylaxis and I will see her back in 4 weeks   Visit Diagnosis 1. Multiple myeloma not having achieved remission ([facility])   2. Encounter for antineoplastic chemotherapy      Dr. Randa Evens, MD,  [facility] at [facility] [id number] 06/24/2022 11:01 AM

## 2022-06-25 ENCOUNTER — Other Ambulatory Visit: Payer: Self-pay

## 2022-06-28 ENCOUNTER — Other Ambulatory Visit: Payer: Self-pay

## 2022-07-10 ENCOUNTER — Other Ambulatory Visit: Payer: Self-pay | Admitting: Dentistry

## 2022-07-10 DIAGNOSIS — M879 Osteonecrosis, unspecified: Secondary | ICD-10-CM

## 2022-07-16 ENCOUNTER — Ambulatory Visit
Admission: RE | Admit: 2022-07-16 | Discharge: 2022-07-16 | Disposition: A | Discharge status: Home / Self Care | Payer: Medicare HMO | Source: Ambulatory Visit | Attending: Dentistry | Admitting: Dentistry

## 2022-07-16 ENCOUNTER — Encounter: Payer: Self-pay | Admitting: Ophthalmology

## 2022-07-16 DIAGNOSIS — M879 Osteonecrosis, unspecified: Secondary | ICD-10-CM | POA: Insufficient documentation

## 2022-07-22 ENCOUNTER — Inpatient Hospital Stay: Payer: Medicare HMO | Attending: Oncology

## 2022-07-22 ENCOUNTER — Inpatient Hospital Stay (HOSPITAL_BASED_OUTPATIENT_CLINIC_OR_DEPARTMENT_OTHER): Payer: Medicare HMO | Admitting: Oncology

## 2022-07-22 ENCOUNTER — Encounter: Payer: Self-pay | Admitting: Oncology

## 2022-07-22 ENCOUNTER — Inpatient Hospital Stay: Payer: Medicare HMO

## 2022-07-22 VITALS — BP 137/82 | HR 91 | Temp 96.7°F | Resp 17 | Wt 165.5 lb

## 2022-07-22 DIAGNOSIS — Z79624 Long term (current) use of inhibitors of nucleotide synthesis: Secondary | ICD-10-CM | POA: Diagnosis not present

## 2022-07-22 DIAGNOSIS — C9000 Multiple myeloma not having achieved remission: Secondary | ICD-10-CM

## 2022-07-22 DIAGNOSIS — Z7982 Long term (current) use of aspirin: Secondary | ICD-10-CM | POA: Diagnosis not present

## 2022-07-22 DIAGNOSIS — Z79899 Other long term (current) drug therapy: Secondary | ICD-10-CM | POA: Insufficient documentation

## 2022-07-22 DIAGNOSIS — E538 Deficiency of other specified B group vitamins: Secondary | ICD-10-CM | POA: Diagnosis present

## 2022-07-22 LAB — CBC WITH DIFFERENTIAL/PLATELET
Abs Immature Granulocytes: 0.04 10*3/uL (ref 0.00–0.07)
Basophils Absolute: 0 10*3/uL (ref 0.0–0.1)
Basophils Relative: 1 %
Eosinophils Absolute: 0.2 10*3/uL (ref 0.0–0.5)
Eosinophils Relative: 3 %
HCT: 43.2 % (ref 36.0–46.0)
Hemoglobin: 14.3 g/dL (ref 12.0–15.0)
Immature Granulocytes: 1 %
Lymphocytes Relative: 13 %
Lymphs Abs: 0.9 10*3/uL (ref 0.7–4.0)
MCH: 30.9 pg (ref 26.0–34.0)
MCHC: 33.1 g/dL (ref 30.0–36.0)
MCV: 93.3 fL (ref 80.0–100.0)
Monocytes Absolute: 0.5 10*3/uL (ref 0.1–1.0)
Monocytes Relative: 7 %
Neutro Abs: 5.4 10*3/uL (ref 1.7–7.7)
Neutrophils Relative %: 75 %
Platelets: 278 10*3/uL (ref 150–400)
RBC: 4.63 MIL/uL (ref 3.87–5.11)
RDW: 13.9 % (ref 11.5–15.5)
WBC: 7.2 10*3/uL (ref 4.0–10.5)
nRBC: 0 % (ref 0.0–0.2)

## 2022-07-22 NOTE — Progress Notes (Signed)
Hematology/Oncology Consult note [facility]  Telephone:([phone] [phone]  Patient Care Team: Denton Lank, MD as PCP - General (Family Medicine) Sindy Guadeloupe, MD as Consulting Physician (Hematology and Oncology)   Name of the patient: Laurie [name]  [id number]  12-12-1945   Date of visit: 07/22/22  Diagnosis- stage III biclonal IgA multiple myeloma    Chief complaint/ Reason for visit-on treatment assessment prior to next cycle of Darzalex  Heme/Onc history:  Patient is a 77 year old female who was diagnosed with stage III biclonal IgA multiple myeloma in June 2021. SPEP was 5.0 gm/dL on 10/03/2019. Random urine revealed 196.6 mg/dL total protein with 58.3% M-spike.  Bone marrow biopsy on 11/13/2019 revealed a hypercellular bone marrow with extensive involvement by plasma cell neoplasm. Atypical plasma cells represented 70% of all cells in the aspirate and was associated with prominent interstitial infiltrates and diffuse sheets in the clot.  Plasma cells were kappa light chain restricted.   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   Bone survey on 10/26/2019 revealed small lytic lesions in the skull. There was no acute fracture.   Patient has been on daratumumab Revlimid dexamethasone regimen since July 2021.  She briefly received Retacrit in the past but has not required it for over 1 year now.   Patient is a Sales promotion account executive Witness  Interval history-patient was seen by Dr. 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  She is planning to undergo surgery for the same on 08/11/2022.  She has completed her course of antibiotics.  ECOG  PS- 1 Pain scale- 0 Opioid associated constipation- no  Review of systems- Review of Systems  Constitutional:  Negative for chills, fever, malaise/fatigue and weight loss.  HENT:  Negative for congestion, ear discharge and nosebleeds.   Eyes:  Negative for blurred vision.  Respiratory:  Negative for cough, hemoptysis, sputum production, shortness of breath and wheezing.   Cardiovascular:  Negative for chest pain, palpitations, orthopnea and claudication.  Gastrointestinal:  Negative for abdominal pain, blood in stool, constipation, diarrhea, heartburn, melena, nausea and vomiting.  Genitourinary:  Negative for dysuria, flank pain, frequency, hematuria and urgency.  Musculoskeletal:  Negative for back pain, joint pain and myalgias.  Skin:  Negative for rash.  Neurological:  Negative for dizziness, tingling, focal weakness, seizures, weakness and headaches.  Endo/Heme/Allergies:  Does not bruise/bleed easily.  Psychiatric/Behavioral:  Negative for depression and suicidal ideas. The patient does not have insomnia.       Allergies  Allergen Reactions   Levofloxacin     Critical      Past Medical History:  Diagnosis Date   Anxiety    Asthma    Cellulitis and abscess of oral soft tissues    Cellulitis and abscess of oral soft tissues 03/31/2021   pt in hospital and now out as outpt   Depression    Head injury    Multiple myeloma ([place])    Osteoporosis    PONV (postoperative nausea and vomiting)      Past Surgical History:  Procedure Laterality Date   ABDOMINAL HYSTERECTOMY     HEMORROIDECTOMY     MOUTH SURGERY     SEPTOPLASTY  Social History   Socioeconomic History   Marital status: Widowed    Spouse name: Not on file   Number of children: Not on file   Years of education: Not on file   Highest education level: Not on file  Occupational History   Not on file  Tobacco Use   Smoking status: Never   Smokeless tobacco: Never  Vaping Use   Vaping Use: Never  used  Substance and Sexual Activity   Alcohol use: No   Drug use: No   Sexual activity: Not Currently  Other Topics Concern   Not on file  Social History Narrative   Jehovah Witness    Social Determinants of Health   Financial Resource Strain: Not on file  Food Insecurity: Not on file  Transportation Needs: Not on file  Physical Activity: Not on file  Stress: Not on file  Social Connections: Not on file  Intimate Partner Violence: Not on file    Family History  Problem Relation Age of Onset   Prostate cancer Brother    Throat cancer Maternal Aunt    Breast cancer Cousin        mat cousin   Breast cancer Cousin    Thyroid cancer Other      Current Outpatient Medications:    acyclovir (ZOVIRAX) 400 MG tablet, Take 1 tablet (400 mg total) by mouth 2 (two) times daily., Disp: 180 tablet, Rfl: 3   albuterol (VENTOLIN HFA) 108 (90 Base) MCG/ACT inhaler, 2 inhalations every 4 (four) hours as needed., Disp: , Rfl:    aspirin 81 MG chewable tablet, Chew by mouth daily., Disp: , Rfl:    atorvastatin (LIPITOR) 20 MG tablet, Take 20 mg by mouth daily., Disp: , Rfl:    busPIRone (BUSPAR) 10 MG tablet, Take 10 mg by mouth 2 (two) times daily., Disp: , Rfl:    Calcium Carb-Cholecalciferol (CALCIUM 600 + D PO), Take 600 mg by mouth 4 (four) times daily. Increase calcium from 600mg  4 times a day to 600mg  5 times a day starting today, Disp: , Rfl:    chlorhexidine (PERIDEX) 0.12 % solution, 15 mLs 2 (two) times daily., Disp: , Rfl:    dexamethasone (DECADRON) 4 MG tablet, Take 5 tablets (20 mg total) by mouth once a week., Disp: 120 tablet, Rfl: 2   docusate sodium (COLACE) 100 MG capsule, Take 100 mg by mouth 2 (two) times daily., Disp: , Rfl:    lenalidomide (REVLIMID) 15 MG capsule, Take 1 capsule (15 mg total) by mouth daily. Take for 21 days, then hold for 7 days. Repeat every 28 days. Obtained on 19/2024, REMS: [id number], Disp: 21 capsule, Rfl: 0   montelukast (SINGULAIR) 10 MG tablet,  Take 10 mg by mouth daily after breakfast., Disp: , Rfl:    nortriptyline (PAMELOR) 50 MG capsule, Take 50 mg by mouth at bedtime., Disp: , Rfl:    nortriptyline (PAMELOR) 50 MG capsule, Take 50 mg by mouth at bedtime., Disp: , Rfl:    omeprazole (PRILOSEC) 20 MG capsule, Take 20 mg by mouth daily., Disp: , Rfl:    ondansetron (ZOFRAN) 8 MG tablet, Take 8 mg by mouth every 8 (eight) hours as needed., Disp: , Rfl:    pentoxifylline (TRENTAL) 400 MG CR tablet, Take 400 mg by mouth 2 (two) times daily., Disp: , Rfl:    sertraline (ZOLOFT) 100 MG tablet, Take 100 mg by mouth 2 (two) times daily., Disp: , Rfl:    traZODone (DESYREL) 50 MG tablet,  Take 50 mg by mouth at bedtime., Disp: , Rfl:    Vitamin D, Ergocalciferol, (DRISDOL) 50000 UNITS CAPS capsule, Take 50,000 Units by mouth every 30 (thirty) days. Once a month, Disp: , Rfl:    vitamin E 1000 UNIT capsule, Take 1,000 Units by mouth daily., Disp: , Rfl:    acetaminophen (TYLENOL) 325 MG tablet, Take 650 mg by mouth every 8 (eight) hours as needed for mild pain. (Patient not taking: Reported on 06/24/2022), Disp: , Rfl:   Physical exam:  Vitals:   07/22/22 0935  BP: 137/82  Pulse: 91  Resp: 17  Temp: (!) 96.7 F (35.9 C)  SpO2: 97%  Weight: 165 lb 8 oz (75.1 kg)   Physical Exam HENT:     Mouth/Throat:     Comments: 3 sinus tracts noted on the undersurface of the chin Cardiovascular:     Rate and Rhythm: Normal rate and regular rhythm.     Heart sounds: Normal heart sounds.  Pulmonary:     Effort: Pulmonary effort is normal.     Breath sounds: Normal breath sounds.  Abdominal:     General: Bowel sounds are normal.     Palpations: Abdomen is soft.  Skin:    General: Skin is warm and dry.  Neurological:     Mental Status: She is alert and oriented to person, place, and time.         Latest Ref Rng & Units 02/03/2022    9:18 AM  CMP  Glucose 70 - 99 mg/dL 108   BUN 8 - 23 mg/dL 21   Creatinine 0.44 - 1.00 mg/dL 1.03    Sodium 135 - 145 mmol/L 138   Potassium 3.5 - 5.1 mmol/L 4.4   Chloride 98 - 111 mmol/L 102   CO2 22 - 32 mmol/L 28   Calcium 8.9 - 10.3 mg/dL 9.6   Total Protein 6.5 - 8.1 g/dL 6.7   Total Bilirubin 0.3 - 1.2 mg/dL 0.4   Alkaline Phos 38 - 126 U/L 78   AST 15 - 41 U/L 15   ALT 0 - 44 U/L 12       Latest Ref Rng & Units 07/22/2022    8:48 AM  CBC  WBC 4.0 - 10.5 K/uL 7.2   Hemoglobin 12.0 - 15.0 g/dL 14.3   Hematocrit 36.0 - 46.0 % 43.2   Platelets 150 - 400 K/uL 278     No images are attached to the encounter.  CT MAXILLOFACIAL WO CONTRAST  Result Date: 07/20/2022 CLINICAL DATA:  History of multiple myeloma. Avascular necrosis of the jaw. EXAM: CT MAXILLOFACIAL WITHOUT CONTRAST TECHNIQUE: Multidetector CT imaging of the maxillofacial structures was performed. Multiplanar CT image reconstructions were also generated. RADIATION DOSE REDUCTION: This exam was performed according to the departmental dose-optimization program which includes automated exposure control, adjustment of the mA and/or kV according to patient size and/or use of iterative reconstruction technique. COMPARISON:  10/27/2021 FINDINGS: Osseous: Visualized calvarium is normal. No mid face abnormality is seen. No evidence of active maxillary dental or periodontal disease. Chronic degenerative change of the temporomandibular joints. Marked progression of abnormal appearance of the mandible in the region of the symphysis, slightly to the left of midline and more extensive to the right of midline. 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 Anterior mandibular dentition is essentially floating. Numerous appearing sinus tracts within the anterior mandible suggest coexistence osteomyelitis. No evidence of drainable soft tissue abscess. Orbits: Normal Sinuses: Clear Soft tissues: Other regional soft  tissues appear unremarkable. Limited intracranial: Negative IMPRESSION: Marked progression of abnormal appearance of the mandible  in the region of the symphysis, slightly to the left of midline and more extensive to the right of midline. 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 Anterior mandibular dentition is essentially floating. Numerous appearing sinus tracts within the anterior mandible suggest coexistence osteomyelitis. No evidence of drainable soft tissue abscess. Electronically Signed   By: Nelson Chimes M.D.   On: 07/20/2022 13:39     Assessment and plan- Patient is a 77 y.o. 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  She is here for on treatment assessment prior to next cycle of Darzalex  Patient was evaluated by oral surgery Dr. 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  She had a CT maxillofacial without contrast which showed marked progression and abnormal appearance of mandible in the region of 7 physis consistent with avascular necrosis.  Numerous sinus tracts within the anterior mandible suggest coexisting osteomyelitis.  She has been scheduled for surgery on 08/11/2022.  I am planning to hold off on giving her any Darzalex or Revlimid for the next 2 months at least.  I would like her to recover from her osteonecrosis complication and heal well before we can restart treatment.  Myeloma labs have been stable so far and we do have time to pause treatment until her osteonecrosis gets sorted out   Visit Diagnosis 1. Multiple myeloma not having achieved remission ([place])      Dr. Randa Evens, MD, MPH [facility] [id number] 07/22/2022 12:32 PM

## 2022-07-23 LAB — KAPPA/LAMBDA LIGHT CHAINS
Kappa free light chain: 4.9 mg/L (ref 3.3–19.4)
Kappa, lambda light chain ratio: 1.44 (ref 0.26–1.65)
Lambda free light chains: 3.4 mg/L — ABNORMAL LOW (ref 5.7–26.3)

## 2022-07-23 NOTE — Discharge Instructions (Signed)

## 2022-07-27 ENCOUNTER — Ambulatory Visit: Payer: Medicare HMO | Admitting: Anesthesiology

## 2022-07-27 ENCOUNTER — Ambulatory Visit
Admission: RE | Admit: 2022-07-27 | Discharge: 2022-07-27 | Disposition: A | Discharge status: Home / Self Care | Payer: Medicare HMO | Attending: Ophthalmology | Admitting: Ophthalmology

## 2022-07-27 ENCOUNTER — Other Ambulatory Visit: Payer: Self-pay

## 2022-07-27 ENCOUNTER — Encounter
Admission: RE | Disposition: A | Discharge status: Home / Self Care | Payer: Self-pay | Source: Home / Self Care | Attending: Ophthalmology

## 2022-07-27 ENCOUNTER — Encounter: Payer: Self-pay | Admitting: Ophthalmology

## 2022-07-27 DIAGNOSIS — C9000 Multiple myeloma not having achieved remission: Secondary | ICD-10-CM | POA: Insufficient documentation

## 2022-07-27 DIAGNOSIS — Z90710 Acquired absence of both cervix and uterus: Secondary | ICD-10-CM | POA: Diagnosis not present

## 2022-07-27 DIAGNOSIS — H2511 Age-related nuclear cataract, right eye: Secondary | ICD-10-CM | POA: Insufficient documentation

## 2022-07-27 DIAGNOSIS — J45909 Unspecified asthma, uncomplicated: Secondary | ICD-10-CM | POA: Insufficient documentation

## 2022-07-27 HISTORY — DX: Other specified postprocedural states: R11.2

## 2022-07-27 HISTORY — DX: Other specified postprocedural states: Z98.890

## 2022-07-27 HISTORY — PX: CATARACT EXTRACTION W/PHACO: SHX586

## 2022-07-27 SURGERY — PHACOEMULSIFICATION, CATARACT, WITH IOL INSERTION
Anesthesia: General | Site: Eye | Laterality: Right

## 2022-07-27 MED ORDER — ARMC OPHTHALMIC DILATING DROPS
1.0000 | OPHTHALMIC | Status: DC | PRN
  Administered 2022-07-27 (×3): 1 via OPHTHALMIC

## 2022-07-27 MED ORDER — MOXIFLOXACIN HCL 0.5 % OP SOLN
OPHTHALMIC | Status: DC | PRN
  Administered 2022-07-27: .2 mL via OPHTHALMIC

## 2022-07-27 MED ORDER — SIGHTPATH DOSE#1 BSS IO SOLN
INTRAOCULAR | Status: DC | PRN
  Administered 2022-07-27: 113 mL via OPHTHALMIC

## 2022-07-27 MED ORDER — MIDAZOLAM HCL 2 MG/2ML IJ SOLN
INTRAMUSCULAR | Status: DC | PRN
  Administered 2022-07-27: 1 mg via INTRAVENOUS

## 2022-07-27 MED ORDER — LACTATED RINGERS IV SOLN: INTRAVENOUS | Status: DC

## 2022-07-27 MED ORDER — BRIMONIDINE TARTRATE-TIMOLOL 0.2-0.5 % OP SOLN
OPHTHALMIC | Status: DC | PRN
  Administered 2022-07-27: 1 [drp] via OPHTHALMIC

## 2022-07-27 MED ORDER — TETRACAINE HCL 0.5 % OP SOLN
1.0000 [drp] | OPHTHALMIC | Status: DC | PRN
  Administered 2022-07-27 (×3): 1 [drp] via OPHTHALMIC

## 2022-07-27 MED ORDER — ONDANSETRON HCL 4 MG/2ML IJ SOLN
INTRAMUSCULAR | Status: DC | PRN
  Administered 2022-07-27: 4 mg via INTRAVENOUS

## 2022-07-27 MED ORDER — LIDOCAINE HCL (PF) 2 % IJ SOLN
INTRAOCULAR | Status: DC | PRN
  Administered 2022-07-27: 1 mL via INTRAOCULAR

## 2022-07-27 MED ORDER — SIGHTPATH DOSE#1 BSS IO SOLN
INTRAOCULAR | Status: DC | PRN
  Administered 2022-07-27: 15 mL

## 2022-07-27 MED ORDER — FENTANYL CITRATE (PF) 100 MCG/2ML IJ SOLN
INTRAMUSCULAR | Status: DC | PRN
  Administered 2022-07-27 (×2): 50 ug via INTRAVENOUS

## 2022-07-27 MED ORDER — SIGHTPATH DOSE#1 NA HYALUR & NA CHOND-NA HYALUR IO KIT
PACK | INTRAOCULAR | Status: DC | PRN
  Administered 2022-07-27: 1 via OPHTHALMIC

## 2022-07-27 SURGICAL SUPPLY — 22 items
CANNULA ANT/CHMB 27G (MISCELLANEOUS) IMPLANT
CANNULA ANT/CHMB 27GA (MISCELLANEOUS) IMPLANT
CATARACT SUITE SIGHTPATH (MISCELLANEOUS) ×1 IMPLANT
DISSECTOR HYDRO NUCLEUS 50X22 (MISCELLANEOUS) ×1 IMPLANT
DRSG TEGADERM 2-3/8X2-3/4 SM (GAUZE/BANDAGES/DRESSINGS) ×1 IMPLANT
FEE CATARACT SUITE SIGHTPATH (MISCELLANEOUS) ×1 IMPLANT
GLOVE SURG SYN 7.5  E (GLOVE) ×1
GLOVE SURG SYN 7.5 E (GLOVE) ×1 IMPLANT
GLOVE SURG SYN 7.5 PF PI (GLOVE) ×1 IMPLANT
GLOVE SURG SYN 8.5  E (GLOVE) ×1
GLOVE SURG SYN 8.5 E (GLOVE) ×1 IMPLANT
GLOVE SURG SYN 8.5 PF PI (GLOVE) ×1 IMPLANT
LENS IOL TECNIS EYHANCE 17.5 (Intraocular Lens) IMPLANT
NDL FILTER BLUNT 18X1 1/2 (NEEDLE) IMPLANT
NEEDLE FILTER BLUNT 18X1 1/2 (NEEDLE) IMPLANT
PACK VIT ANT 23G (MISCELLANEOUS) IMPLANT
RING MALYGIN 7.0 (MISCELLANEOUS) IMPLANT
SUT ETHILON 10-0 CS-B-6CS-B-6 (SUTURE)
SUTURE EHLN 10-0 CS-B-6CS-B-6 (SUTURE) IMPLANT
SYR 3ML LL SCALE MARK (SYRINGE) IMPLANT
SYR 5ML LL (SYRINGE) IMPLANT
WATER STERILE IRR 250ML POUR (IV SOLUTION) ×1 IMPLANT

## 2022-07-27 NOTE — H&P (Signed)
[facility]   Primary Care Physician:  Denton Lank, MD Ophthalmologist: Dr. Merleen Nicely  Pre-Procedure History & Physical: HPI:  Laurie [name] is a 77 y.o. female here for cataract surgery.   Past Medical History:  Diagnosis Date   Anxiety    Asthma    Cellulitis and abscess of oral soft tissues    Cellulitis and abscess of oral soft tissues 03/31/2021   pt in hospital and now out as outpt   Depression    Head injury    Multiple myeloma ([place])    Osteoporosis    PONV (postoperative nausea and vomiting)     Past Surgical History:  Procedure Laterality Date   ABDOMINAL HYSTERECTOMY     HEMORROIDECTOMY     MOUTH SURGERY     SEPTOPLASTY      Prior to Admission medications   Medication Sig Start Date End Date Taking? Authorizing Provider  acyclovir (ZOVIRAX) 400 MG tablet Take 1 tablet (400 mg total) by mouth 2 (two) times daily. 10/13/21  Yes Sindy Guadeloupe, MD  albuterol (VENTOLIN HFA) 108 (90 Base) MCG/ACT inhaler 2 inhalations every 4 (four) hours as needed.   Yes [provider]  aspirin 81 MG chewable tablet Chew by mouth daily.   Yes [provider]  atorvastatin (LIPITOR) 20 MG tablet Take 20 mg by mouth daily.   Yes [provider]  busPIRone (BUSPAR) 10 MG tablet Take 10 mg by mouth 2 (two) times daily.   Yes [provider]  Calcium Carb-Cholecalciferol (CALCIUM 600 + D PO) Take 600 mg by mouth 4 (four) times daily. Increase calcium from 600mg  4 times a day to 600mg  5 times a day starting today 06/13/20  Yes [provider]  chlorhexidine (PERIDEX) 0.12 % solution 15 mLs 2 (two) times daily. 06/10/21  Yes [provider]  dexamethasone (DECADRON) 4 MG tablet Take 5 tablets (20 mg total) by mouth once a week. 10/13/21  Yes Sindy Guadeloupe, MD  docusate sodium (COLACE) 100 MG capsule Take 100 mg by mouth 2 (two) times daily.   Yes [provider]  lenalidomide (REVLIMID) 15 MG capsule Take 1 capsule (15  mg total) by mouth daily. Take for 21 days, then hold for 7 days. Repeat every 28 days. Obtained on 19/2024, REMS: [id number] 05/12/22  Yes Sindy Guadeloupe, MD  montelukast (SINGULAIR) 10 MG tablet Take 10 mg by mouth daily after breakfast.   Yes [provider]  nortriptyline (PAMELOR) 50 MG capsule Take 50 mg by mouth at bedtime. 06/04/22  Yes [provider]  omeprazole (PRILOSEC) 20 MG capsule Take 20 mg by mouth daily.   Yes [provider]  ondansetron (ZOFRAN) 8 MG tablet Take 8 mg by mouth every 8 (eight) hours as needed. 07/04/21  Yes [provider]  pentoxifylline (TRENTAL) 400 MG CR tablet Take 400 mg by mouth 2 (two) times daily. 11/14/21  Yes [provider]  sertraline (ZOLOFT) 100 MG tablet Take 100 mg by mouth 2 (two) times daily.   Yes [provider]  traZODone (DESYREL) 50 MG tablet Take 50 mg by mouth at bedtime. 03/24/21  Yes [provider]  Vitamin D, Ergocalciferol, (DRISDOL) 50000 UNITS CAPS capsule Take 50,000 Units by mouth every 30 (thirty) days. Once a month   Yes [provider]  vitamin E 1000 UNIT capsule Take 1,000 Units by mouth daily.   Yes [provider]  acetaminophen (TYLENOL) 325 MG tablet Take 650  mg by mouth every 8 (eight) hours as needed for mild pain. Patient not taking: Reported on 06/24/2022    [provider]  nortriptyline (PAMELOR) 50 MG capsule Take 50 mg by mouth at bedtime.    [provider]    Allergies as of 07/03/2022 - Review Complete 06/24/2022  Allergen Reaction Noted   Levofloxacin  09/10/2014    Family History  Problem Relation Age of Onset   Prostate cancer Brother    Throat cancer Maternal Aunt    Breast cancer Cousin        mat cousin   Breast cancer Cousin    Thyroid cancer Other     Social History   Socioeconomic History   Marital status: Widowed    Spouse name: Not on file   Number of children: Not on file   Years of  education: Not on file   Highest education level: Not on file  Occupational History   Not on file  Tobacco Use   Smoking status: Never   Smokeless tobacco: Never  Vaping Use   Vaping Use: Never used  Substance and Sexual Activity   Alcohol use: No   Drug use: No   Sexual activity: Not Currently  Other Topics Concern   Not on file  Social History Narrative   Jehovah Witness    Social Determinants of Health   Financial Resource Strain: Not on file  Food Insecurity: Not on file  Transportation Needs: Not on file  Physical Activity: Not on file  Stress: Not on file  Social Connections: Not on file  Intimate Partner Violence: Not on file    Review of Systems: See HPI, otherwise negative ROS  Physical Exam: Ht 5\' 3"  (1.6 m)   Wt 77.6 kg   BMI 30.29 kg/m  General:   Alert, cooperative in NAD Head:  Normocephalic and atraumatic. Respiratory:  Normal work of breathing. Cardiovascular:  RRR  Impression/Plan: Laurie [name] is here for cataract surgery.  Risks, benefits, limitations, and alternatives regarding cataract surgery have been reviewed with the patient.  Questions have been answered.  All parties agreeable.   Norvel Richards, MD  07/27/2022, 7:08 AM

## 2022-07-27 NOTE — Anesthesia Postprocedure Evaluation (Signed)
Anesthesia Post Note  Patient: Laurie [name]  Procedure(s) Performed: CATARACT EXTRACTION PHACO AND INTRAOCULAR LENS PLACEMENT (IOC) RIGHT VISION BLUE (Right: Eye)  Patient location during evaluation: PACU Anesthesia Type: MAC Level of consciousness: awake and alert Pain management: pain level controlled Vital Signs Assessment: post-procedure vital signs reviewed and stable Respiratory status: spontaneous breathing, nonlabored ventilation, respiratory function stable and patient connected to nasal cannula oxygen Cardiovascular status: blood pressure returned to baseline and stable Postop Assessment: no apparent nausea or vomiting Anesthetic complications: no  No notable events documented.   Last Vitals:  Vitals:   07/27/22 1014 07/27/22 1019  BP: (!) 154/81 (!) 152/85  Pulse: 95 92  Resp: 20 14  Temp: (!) 36.2 C (!) 36.2 C  SpO2: 97% 100%    Last Pain:  Vitals:   07/27/22 1019  TempSrc:   PainSc: [place]

## 2022-07-27 NOTE — Transfer of Care (Signed)
Immediate Anesthesia Transfer of Care Note  Patient: Laurie [name]  Procedure(s) Performed: CATARACT EXTRACTION PHACO AND INTRAOCULAR LENS PLACEMENT (IOC) RIGHT VISION BLUE (Right: Eye)  Patient Location: PACU  Anesthesia Type: General  Level of Consciousness: awake, alert  and patient cooperative  Airway and Oxygen Therapy: Patient Spontanous Breathing and Patient connected to supplemental oxygen  Post-op Assessment: Post-op Vital signs reviewed, Patient's Cardiovascular Status Stable, Respiratory Function Stable, Patent Airway and No signs of Nausea or vomiting  Post-op Vital Signs: Reviewed and stable  Complications: No notable events documented.

## 2022-07-27 NOTE — Anesthesia Preprocedure Evaluation (Signed)
Anesthesia Evaluation  Patient identified by MRN, date of birth, ID band Patient awake    Reviewed: Allergy & Precautions, NPO status , Patient's Chart, lab work & pertinent test results  History of Anesthesia Complications Negative for: history of anesthetic complications  Airway Mallampati: II  TM Distance: >3 FB Neck ROM: full    Dental  (+) Chipped, Dental Advidsory Given   Pulmonary neg shortness of breath, asthma    Pulmonary exam normal        Cardiovascular (-) angina (-) Past MI negative cardio ROS Normal cardiovascular exam     Neuro/Psych negative neurological ROS  negative psych ROS   GI/Hepatic negative GI ROS, Neg liver ROS,,,  Endo/Other  negative endocrine ROS    Renal/GU negative Renal ROS  negative genitourinary   Musculoskeletal   Abdominal   Peds  Hematology negative hematology ROS (+)   Anesthesia Other Findings Patient is a Jehovah's Witness and declines blood administration today. Explained the risk of bleeding with cataract surgery was very small but told told her we would honor her request.   Past Medical History: No date: Anxiety No date: Asthma No date: Cellulitis and abscess of oral soft tissues 03/31/2021: Cellulitis and abscess of oral soft tissues     Comment:  pt in hospital and now out as outpt No date: Depression No date: Head injury No date: Multiple myeloma ([place]) No date: Osteoporosis No date: PONV (postoperative nausea and vomiting)  Past Surgical History: No date: ABDOMINAL HYSTERECTOMY No date: HEMORROIDECTOMY No date: MOUTH SURGERY No date: SEPTOPLASTY  BMI    Body Mass Index: 29.81 kg/m      Reproductive/Obstetrics negative OB ROS                             Anesthesia Physical Anesthesia Plan  ASA: 2  Anesthesia Plan: General   Post-op Pain Management:    Induction: Intravenous  PONV Risk Score and Plan: Midazolam and  TIVA  Airway Management Planned: Natural Airway and Nasal Cannula  Additional Equipment:   Intra-op Plan:   Post-operative Plan:   Informed Consent: I have reviewed the patients History and Physical, chart, labs and discussed the procedure including the risks, benefits and alternatives for the proposed anesthesia with the patient or authorized representative who has indicated his/her understanding and acceptance.     Dental Advisory Given  Plan Discussed with: Anesthesiologist, CRNA and Surgeon  Anesthesia Plan Comments: (Patient consented for risks of anesthesia including but not limited to:  - adverse reactions to medications - risk of airway placement if required - damage to eyes, teeth, lips or other oral mucosa - nerve damage due to positioning  - sore throat or hoarseness - Damage to heart, brain, nerves, lungs, other parts of body or loss of life  Patient voiced understanding.)       Anesthesia Quick Evaluation

## 2022-07-27 NOTE — Op Note (Signed)
OPERATIVE NOTE  BELLAMIE CAPOTOSTO [id number] 07/27/2022   PREOPERATIVE DIAGNOSIS: Nuclear sclerotic cataract right eye. H25.11   POSTOPERATIVE DIAGNOSIS: Nuclear sclerotic cataract right eye. H25.11   PROCEDURE:  Phacoemusification with posterior chamber intraocular lens placement of the right eye  Ultrasound time: Procedure(s) with comments: CATARACT EXTRACTION PHACO AND INTRAOCULAR LENS PLACEMENT (IOC) RIGHT VISION BLUE (Right) - 21.53 2:00.6  LENS:   Implant Name Type Inv. Item Serial No. Manufacturer Lot No. LRB No. Used Action  LENS IOL TECNIS EYHANCE 17.5 - [id number] Intraocular Lens LENS IOL TECNIS EYHANCE 17.5 [id number] [employer]  Right 1 Implanted      SURGEON:  Courtney Heys. Lazarus Salines, MD   ANESTHESIA:  Topical with tetracaine drops, augmented with 1% preservative-free intracameral lidocaine.   COMPLICATIONS:  None.   DESCRIPTION OF PROCEDURE:  The patient was identified in the holding room and transported to the operating room and placed in the supine position under the operating microscope.  The right eye was identified as the operative eye, which was prepped and draped in the usual sterile ophthalmic fashion.   A 1 millimeter clear-corneal paracentesis was made superotemporally. Preservative-free 1% lidocaine mixed with 1:1,000 bisulfite-free aqueous solution of epinephrine was injected into the anterior chamber. The anterior chamber was then filled with Viscoat viscoelastic. A 2.4 millimeter keratome was used to make a clear-corneal incision inferotemporally. A curvilinear capsulorrhexis was made with a cystotome and capsulorrhexis forceps. Balanced salt solution was used to hydrodissect and hydrodelineate the nucleus. Phacoemulsification was then used to remove the lens nucleus and epinucleus. The remaining cortex was then removed using the irrigation and aspiration handpiece. Provisc was then placed into the capsular bag to distend it for lens placement. A +17.50 D [id number]  intraocular lens was then injected into the capsular bag. The remaining viscoelastic was aspirated.   Wounds were hydrated with balanced salt solution.  The anterior chamber was inflated to a physiologic pressure with balanced salt solution.  No wound leaks were noted. Vigamox was injected intracamerally.  Timolol and Brimonidine drops were applied to the eye.  The patient was taken to the recovery room in stable condition without complications of anesthesia or surgery.  Maryann Alar Ridgeley 07/27/2022, 10:13 AM

## 2022-07-28 ENCOUNTER — Encounter: Payer: Self-pay | Admitting: Ophthalmology

## 2022-07-28 LAB — MULTIPLE MYELOMA PANEL, SERUM
Albumin SerPl Elph-Mcnc: 3.9 g/dL (ref 2.9–4.4)
Albumin/Glob SerPl: 1.7 (ref 0.7–1.7)
Alpha 1: 0.3 g/dL (ref 0.0–0.4)
Alpha2 Glob SerPl Elph-Mcnc: 0.7 g/dL (ref 0.4–1.0)
B-Globulin SerPl Elph-Mcnc: 0.9 g/dL (ref 0.7–1.3)
Gamma Glob SerPl Elph-Mcnc: 0.3 g/dL — ABNORMAL LOW (ref 0.4–1.8)
Globulin, Total: 2.3 g/dL (ref 2.2–3.9)
IgA: 33 mg/dL — ABNORMAL LOW (ref 64–422)
IgG (Immunoglobin G), Serum: 293 mg/dL — ABNORMAL LOW (ref 586–1602)
IgM (Immunoglobulin M), Srm: 13 mg/dL — ABNORMAL LOW (ref 26–217)
Total Protein ELP: 6.2 g/dL (ref 6.0–8.5)

## 2022-08-04 ENCOUNTER — Other Ambulatory Visit: Payer: Self-pay

## 2022-08-11 ENCOUNTER — Other Ambulatory Visit: Payer: Self-pay

## 2022-08-18 ENCOUNTER — Other Ambulatory Visit: Payer: Self-pay

## 2022-08-19 ENCOUNTER — Ambulatory Visit: Payer: Medicare HMO | Admitting: Oncology

## 2022-08-19 ENCOUNTER — Other Ambulatory Visit: Payer: Medicare HMO

## 2022-08-19 ENCOUNTER — Ambulatory Visit: Payer: Medicare HMO

## 2022-08-20 IMAGING — CT CT HEAD W/O CM
1 series · 16 of 29 positions shown, 20 images · non-contrast
Comparison: 02/26/2014

CLINICAL DATA: Recent fall with headaches, initial encounter

EXAM:
CT HEAD WITHOUT CONTRAST
TECHNIQUE: Contiguous axial images were obtained from the base of the skull
through the vertex without intravenous contrast.

[Series 2: head wo · axial · 0.43mm/px · z∈[-120,+10]mm · 16 of 29 slices shown, 20 images]
[im 2/29  brain]
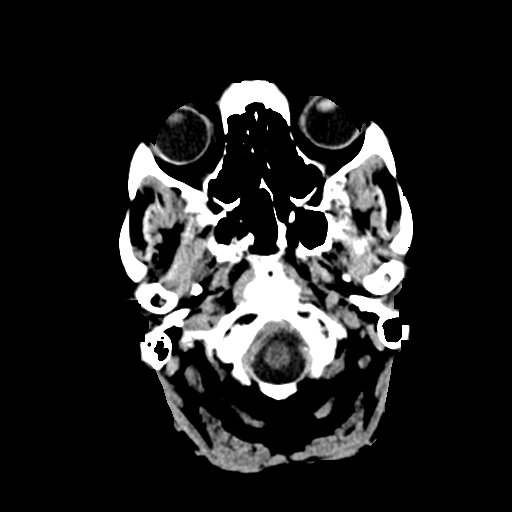
[im 2/29  bone]
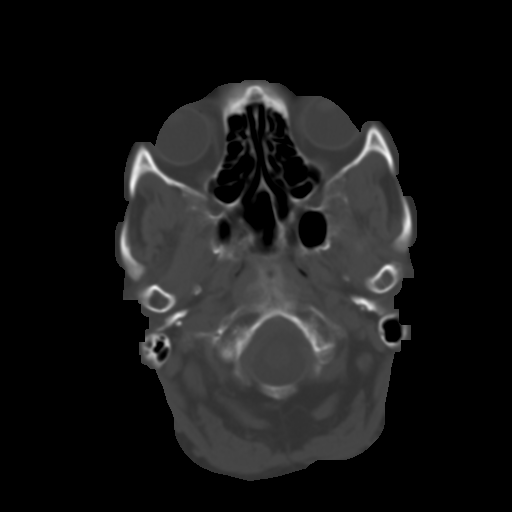
[im 4/29  brain]
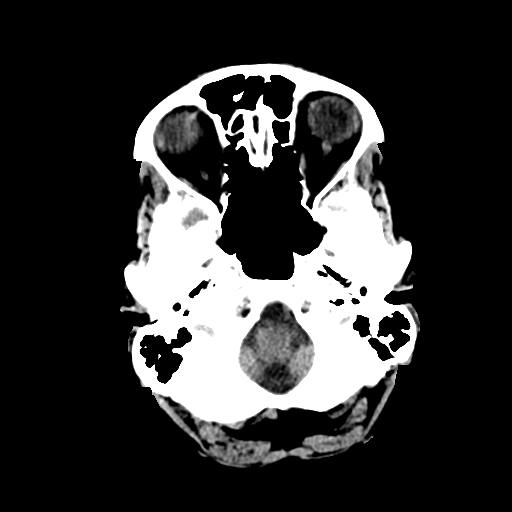
[im 6/29  brain]
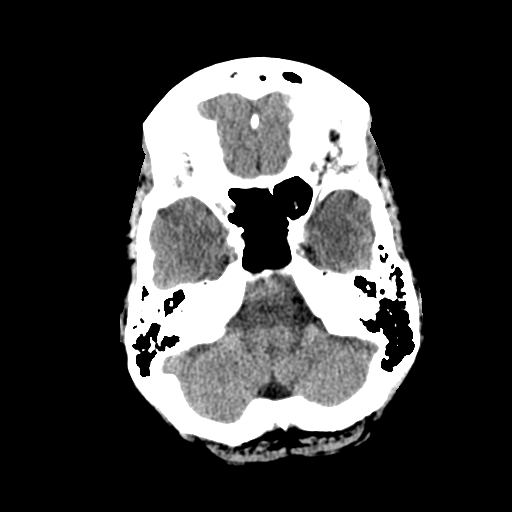
[im 7/29  brain]
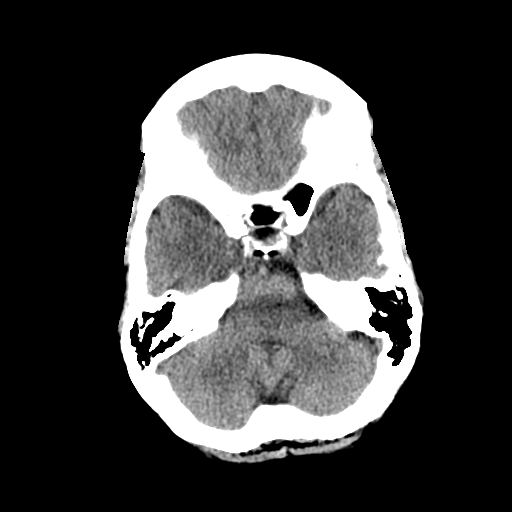
[im 9/29  brain]
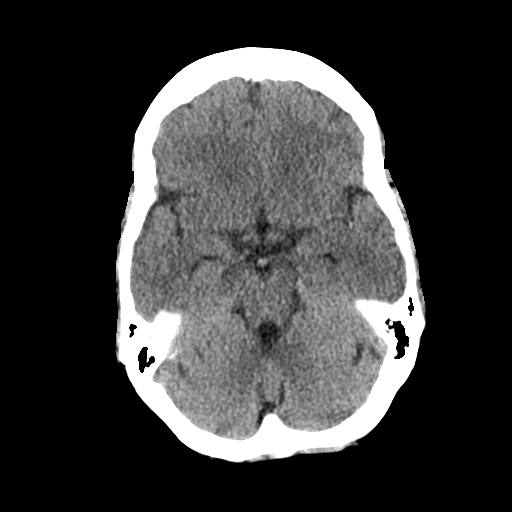
[im 9/29  bone]
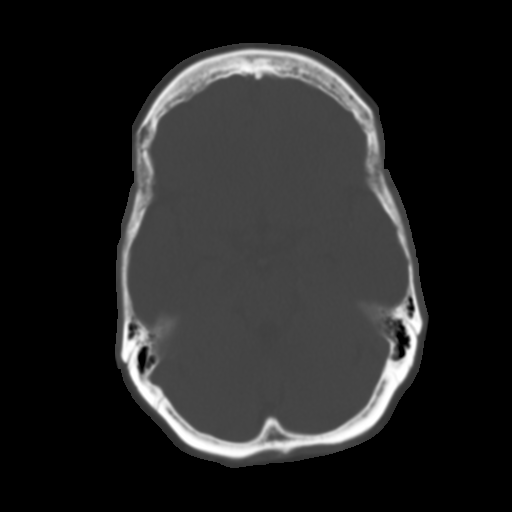
[im 11/29  brain]
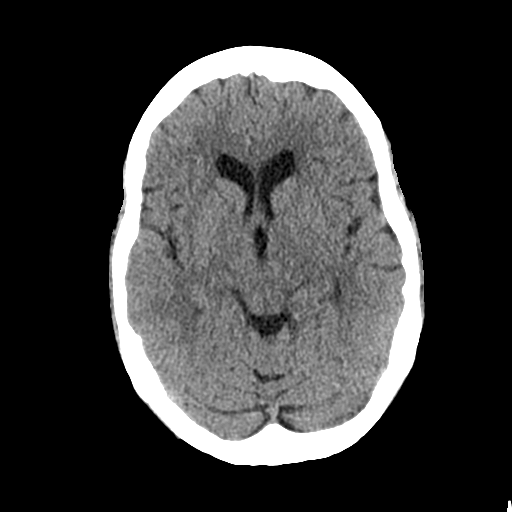
[im 12/29  brain]
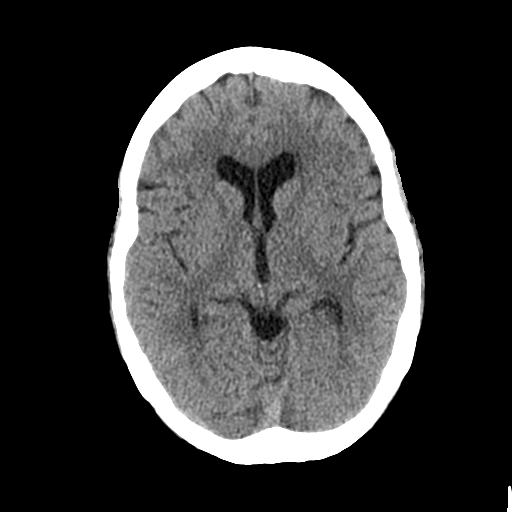
[im 14/29  brain]
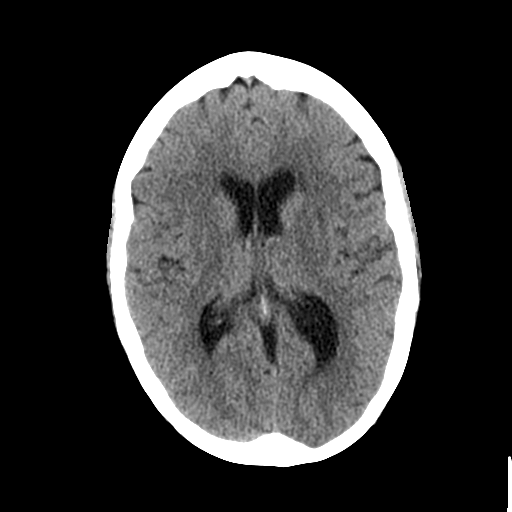
[im 16/29  brain]
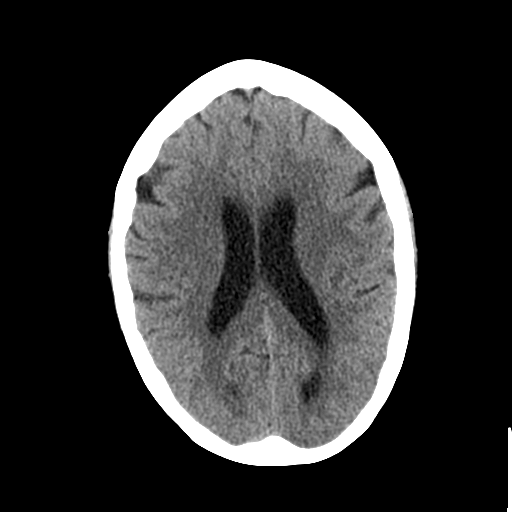
[im 16/29  bone]
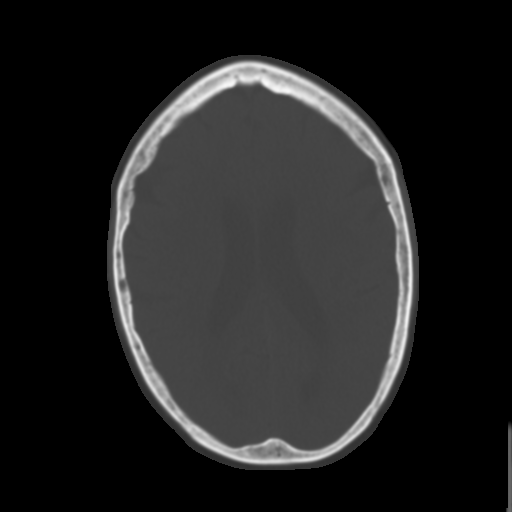
[im 18/29  brain]
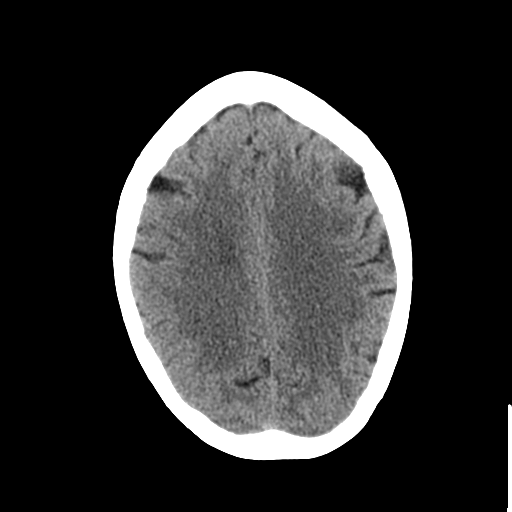
[im 19/29  brain]
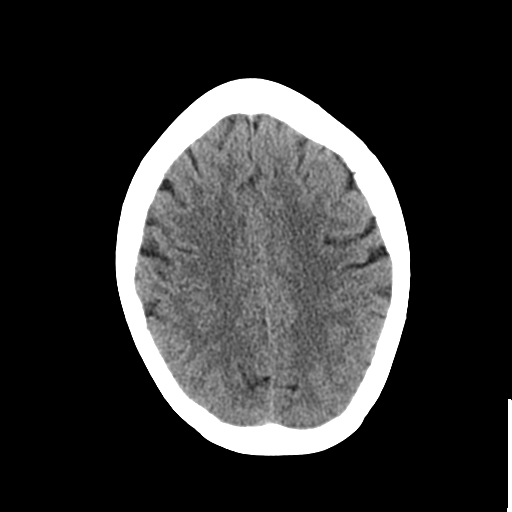
[im 21/29  brain]
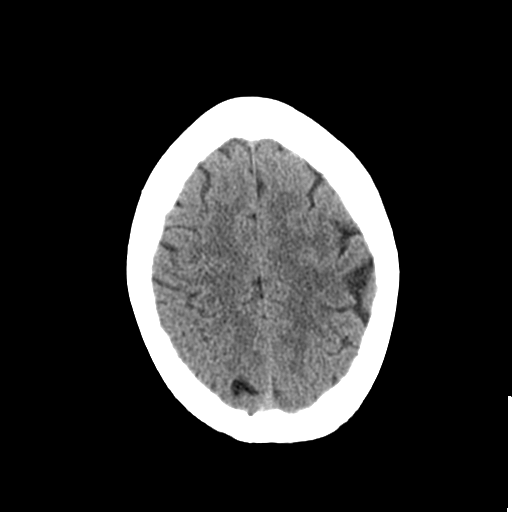
[im 23/29  brain]
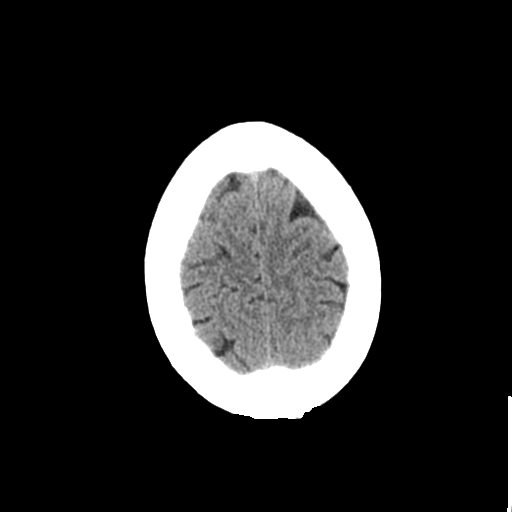
[im 23/29  bone]
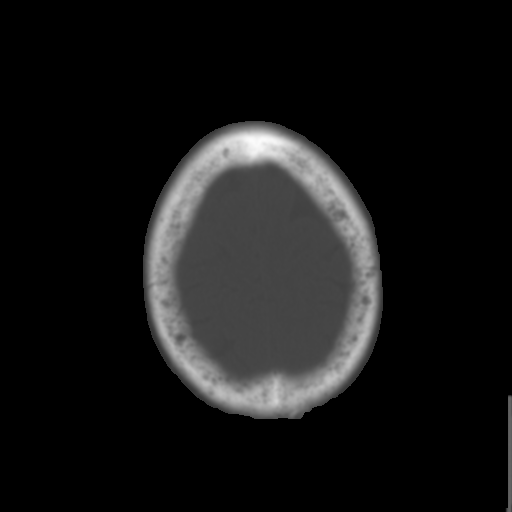
[im 24/29  brain]
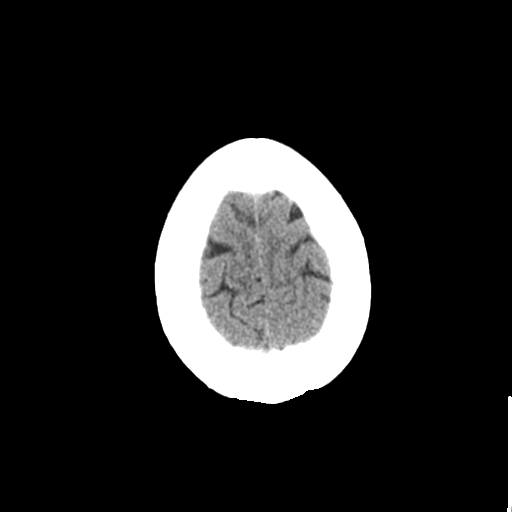
[im 26/29  brain]
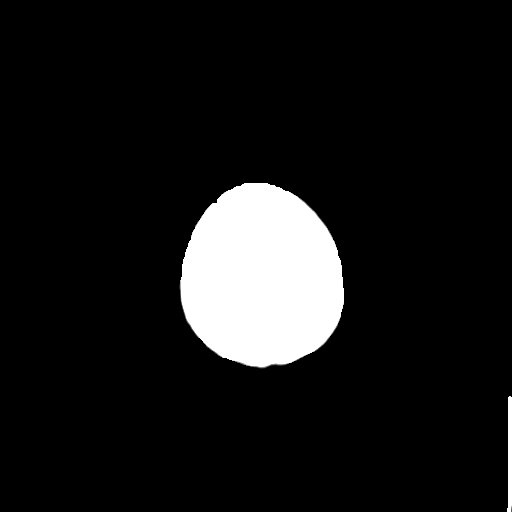
[im 28/29  brain]
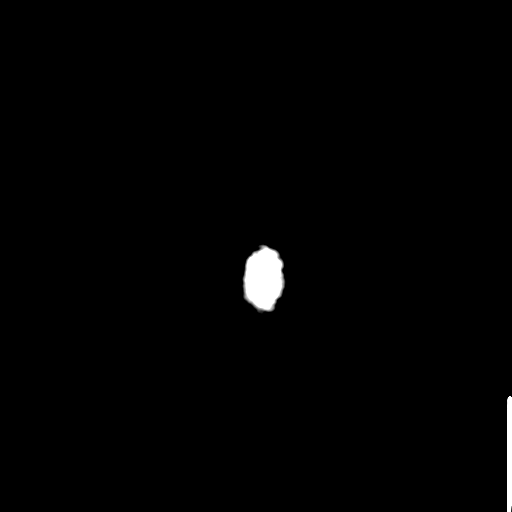

[16 of 29 positions shown; findings below may reference images not displayed]

FINDINGS: Brain: No evidence of acute infarction, hemorrhage, hydrocephalus,
extra-axial collection or mass lesion/mass effect.

Vascular: No hyperdense vessel or unexpected calcification.

Skull: No acute fracture is noted. Multiple lucent areas are
identified consistent with the patient's given clinical history of
multiple myeloma.

Sinuses/Orbits: No acute finding.

Other: None.
IMPRESSION: No acute intracranial abnormality noted.

Increased lucent lesions within the skull consistent with the given
clinical history of myeloma.

## 2022-09-07 ENCOUNTER — Other Ambulatory Visit: Payer: Self-pay | Admitting: Oncology

## 2022-09-07 DIAGNOSIS — C9000 Multiple myeloma not having achieved remission: Secondary | ICD-10-CM

## 2022-09-16 ENCOUNTER — Ambulatory Visit: Payer: Medicare HMO | Admitting: Oncology

## 2022-09-16 ENCOUNTER — Ambulatory Visit: Payer: Medicare HMO

## 2022-09-16 ENCOUNTER — Other Ambulatory Visit: Payer: Medicare HMO

## 2022-09-17 ENCOUNTER — Other Ambulatory Visit: Payer: Self-pay | Admitting: *Deleted

## 2022-09-17 DIAGNOSIS — C9000 Multiple myeloma not having achieved remission: Secondary | ICD-10-CM

## 2022-09-18 ENCOUNTER — Telehealth: Payer: Self-pay | Admitting: Pharmacist

## 2022-09-18 ENCOUNTER — Inpatient Hospital Stay: Payer: Medicare HMO

## 2022-09-18 ENCOUNTER — Encounter: Payer: Self-pay | Admitting: Oncology

## 2022-09-18 ENCOUNTER — Inpatient Hospital Stay (HOSPITAL_BASED_OUTPATIENT_CLINIC_OR_DEPARTMENT_OTHER): Payer: Medicare HMO | Admitting: Oncology

## 2022-09-18 ENCOUNTER — Inpatient Hospital Stay: Payer: Medicare HMO | Attending: Oncology

## 2022-09-18 VITALS — BP 136/65 | HR 95 | Temp 97.6°F | Resp 19 | Wt 163.1 lb

## 2022-09-18 DIAGNOSIS — Z803 Family history of malignant neoplasm of breast: Secondary | ICD-10-CM | POA: Insufficient documentation

## 2022-09-18 DIAGNOSIS — Z8042 Family history of malignant neoplasm of prostate: Secondary | ICD-10-CM | POA: Diagnosis not present

## 2022-09-18 DIAGNOSIS — Z7961 Long term (current) use of immunomodulator: Secondary | ICD-10-CM | POA: Insufficient documentation

## 2022-09-18 DIAGNOSIS — Z79899 Other long term (current) drug therapy: Secondary | ICD-10-CM | POA: Insufficient documentation

## 2022-09-18 DIAGNOSIS — Z79624 Long term (current) use of inhibitors of nucleotide synthesis: Secondary | ICD-10-CM | POA: Insufficient documentation

## 2022-09-18 DIAGNOSIS — E538 Deficiency of other specified B group vitamins: Secondary | ICD-10-CM | POA: Diagnosis present

## 2022-09-18 DIAGNOSIS — Z808 Family history of malignant neoplasm of other organs or systems: Secondary | ICD-10-CM | POA: Diagnosis not present

## 2022-09-18 DIAGNOSIS — Z801 Family history of malignant neoplasm of trachea, bronchus and lung: Secondary | ICD-10-CM | POA: Diagnosis not present

## 2022-09-18 DIAGNOSIS — C9000 Multiple myeloma not having achieved remission: Secondary | ICD-10-CM

## 2022-09-18 DIAGNOSIS — Z7982 Long term (current) use of aspirin: Secondary | ICD-10-CM | POA: Diagnosis not present

## 2022-09-18 DIAGNOSIS — C9001 Multiple myeloma in remission: Secondary | ICD-10-CM

## 2022-09-18 LAB — CBC WITH DIFFERENTIAL/PLATELET
Abs Immature Granulocytes: 0.03 10*3/uL (ref 0.00–0.07)
Basophils Absolute: 0 10*3/uL (ref 0.0–0.1)
Basophils Relative: 1 %
Eosinophils Absolute: 0.1 10*3/uL (ref 0.0–0.5)
Eosinophils Relative: 2 %
HCT: 41.4 % (ref 36.0–46.0)
Hemoglobin: 13.6 g/dL (ref 12.0–15.0)
Immature Granulocytes: 0 %
Lymphocytes Relative: 6 %
Lymphs Abs: 0.5 10*3/uL — ABNORMAL LOW (ref 0.7–4.0)
MCH: 31.2 pg (ref 26.0–34.0)
MCHC: 32.9 g/dL (ref 30.0–36.0)
MCV: 95 fL (ref 80.0–100.0)
Monocytes Absolute: 0.2 10*3/uL (ref 0.1–1.0)
Monocytes Relative: 3 %
Neutro Abs: 7.3 10*3/uL (ref 1.7–7.7)
Neutrophils Relative %: 88 %
Platelets: 263 10*3/uL (ref 150–400)
RBC: 4.36 MIL/uL (ref 3.87–5.11)
RDW: 12.5 % (ref 11.5–15.5)
WBC: 8.2 10*3/uL (ref 4.0–10.5)
nRBC: 0 % (ref 0.0–0.2)

## 2022-09-18 MED ORDER — ONDANSETRON HCL 8 MG PO TABS: 8.0000 mg | ORAL_TABLET | Freq: Three times a day (TID) | ORAL | 2 refills | Status: DC | PRN

## 2022-09-18 MED ORDER — LENALIDOMIDE 10 MG PO CAPS
10.00 mg | ORAL_CAPSULE | Freq: Every day | ORAL | 0 refills | Status: DC
Start: 2022-09-18 — End: 2022-11-09

## 2022-09-18 NOTE — Progress Notes (Signed)
Hematology/Oncology Consult note [facility]  Telephone:([phone] [phone]  Patient Care Team: Hillery Aldo, MD as PCP - General (Family Medicine) Creig Hines, MD as Consulting Physician (Hematology and Oncology)   Name of the patient: Laurie [name]  [id number]  [birth year]   Date of visit: 09/18/22  Diagnosis- stage III biclonal IgA multiple myeloma      Chief complaint/ Reason for visit-routine follow-up of multiple myeloma  Heme/Onc history:  Patient is a 77 year old female who was diagnosed with stage III biclonal IgA multiple myeloma in June 2021. SPEP was 5.0 gm/dL on 62/13/0865. Random urine revealed 196.6 mg/dL total protein with 78.4% M-spike.  Bone marrow biopsy on 11/13/2019 revealed a hypercellular bone marrow with extensive involvement by plasma cell neoplasm. Atypical plasma cells represented 70% of all cells in the aspirate and was associated with prominent interstitial infiltrates and diffuse sheets in the clot.  Plasma cells were kappa light chain restricted.   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   Bone survey on 10/26/2019 revealed small lytic lesions in the skull. There was no acute fracture.   Patient has been on daratumumab Revlimid dexamethasone regimen since July 2021.  She briefly received Retacrit in the past but has not required it for over 1 year now.   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 Mauri Pole at [facility].  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 Mauri Pole and debridement of extensive areas of osteonecrosis.  She is currently on penicillin for cervical actinomycosis  ECOG PS- 1 Pain scale- 0 Opioid associated constipation- no  Review of systems- Review of Systems  Constitutional:  Negative for chills, fever, malaise/fatigue and weight loss.  HENT:  Negative for congestion, ear discharge and nosebleeds.   Eyes:  Negative for blurred vision.  Respiratory:  Negative for cough, hemoptysis, sputum production, shortness of breath and wheezing.   Cardiovascular:  Negative for chest pain, palpitations, orthopnea and claudication.  Gastrointestinal:  Negative for abdominal pain, blood in stool, constipation, diarrhea, heartburn, melena, nausea and vomiting.  Genitourinary:  Negative for dysuria, flank pain, frequency, hematuria and urgency.  Musculoskeletal:  Negative for back pain, joint pain and myalgias.  Skin:  Negative for rash.  Neurological:  Negative for dizziness, tingling, focal weakness, seizures, weakness and headaches.  Endo/Heme/Allergies:  Does not bruise/bleed easily.  Psychiatric/Behavioral:  Negative for depression and suicidal ideas. The patient does not have insomnia.       Allergies  Allergen Reactions   Levofloxacin     Critical      Past Medical History:  Diagnosis Date   Anxiety    Asthma    Cellulitis and abscess of oral soft tissues    Cellulitis and abscess of oral soft tissues 03/31/2021   pt in hospital and now out as outpt   Depression    Head injury    Multiple myeloma (HCC)    Osteoporosis    PONV (postoperative nausea and vomiting)      Past Surgical History:  Procedure Laterality Date  ABDOMINAL HYSTERECTOMY     CATARACT EXTRACTION W/PHACO Right 07/27/2022   Procedure: CATARACT EXTRACTION PHACO AND INTRAOCULAR LENS PLACEMENT (IOC) RIGHT VISION BLUE;  Surgeon: Estanislado Pandy, MD;  Location: [facility];  Service: Ophthalmology;  Laterality: Right;  21.53 2:00.6   HEMORROIDECTOMY     MOUTH SURGERY     SEPTOPLASTY      Social History   Socioeconomic History   Marital status: Widowed    Spouse name: Not on file   Number of children: Not on file   Years of education: Not on file   Highest education level: Not on file  Occupational History   Not on file  Tobacco Use   Smoking status: Never   Smokeless tobacco: Never  Vaping Use   Vaping Use: Never used  Substance and Sexual Activity   Alcohol use: No   Drug use: No   Sexual activity: Not Currently  Other Topics Concern   Not on file  Social History Narrative   Jehovah Witness    Social Determinants of Health   Financial Resource Strain: Not on file  Food Insecurity: Not on file  Transportation Needs: Not on file  Physical Activity: Not on file  Stress: Not on file  Social Connections: Not on file  Intimate Partner Violence: Not on file    Family History  Problem Relation Age of Onset   Prostate cancer Brother    Throat cancer Maternal Aunt    Breast cancer Cousin        mat cousin   Breast cancer Cousin    Thyroid cancer Other      Current Outpatient Medications:    acyclovir (ZOVIRAX) 400 MG tablet, TAKE 1 TABLET BY MOUTH TWICE A DAY, Disp: 180 tablet, Rfl: 3   albuterol (VENTOLIN HFA) 108 (90 Base) MCG/ACT inhaler, 2 inhalations every 4 (four) hours as needed., Disp: , Rfl:    amoxicillin (AMOXIL) 500 MG capsule, Take by mouth., Disp: , Rfl:    amoxicillin-clavulanate (AUGMENTIN) 875-125 MG tablet, Take 1 tablet by mouth 2 (two) times daily., Disp: , Rfl:    aspirin 81 MG chewable tablet, Chew by mouth daily., Disp: , Rfl:    atorvastatin (LIPITOR) 20 MG tablet, Take 20 mg by mouth daily., Disp: , Rfl:    busPIRone (BUSPAR) 10 MG tablet, Take 10 mg by mouth 2 (two) times daily., Disp: , Rfl:    Calcium Carb-Cholecalciferol (CALCIUM 600 + D PO), Take 600 mg by mouth 4 (four) times daily. Increase  calcium from 600mg  4 times a day to 600mg  5 times a day starting today, Disp: , Rfl:    chlorhexidine (PERIDEX) 0.12 % solution, 15 mLs 2 (two) times daily., Disp: , Rfl:    dexamethasone (DECADRON) 4 MG tablet, Take 5 tablets (20 mg total) by mouth once a week., Disp: 120 tablet, Rfl: 2   docusate sodium (COLACE) 100 MG capsule, Take 100 mg by mouth 2 (two) times daily., Disp: , Rfl:    montelukast (SINGULAIR) 10 MG tablet, Take 10 mg by mouth daily after breakfast., Disp: , Rfl:    nortriptyline (PAMELOR) 50 MG capsule, Take 50 mg by mouth at bedtime., Disp: , Rfl:    nortriptyline (PAMELOR) 50 MG capsule, Take 50 mg by mouth at bedtime., Disp: , Rfl:    omeprazole (PRILOSEC) 20 MG capsule, Take 20 mg by mouth daily., Disp: , Rfl:    pentoxifylline (TRENTAL) 400 MG CR tablet, Take 400 mg by mouth 2 (two)  times daily., Disp: , Rfl:    sertraline (ZOLOFT) 100 MG tablet, Take 100 mg by mouth 2 (two) times daily., Disp: , Rfl:    traZODone (DESYREL) 50 MG tablet, Take 50 mg by mouth at bedtime., Disp: , Rfl:    Vitamin D, Ergocalciferol, (DRISDOL) 50000 UNITS CAPS capsule, Take 50,000 Units by mouth every 30 (thirty) days. Once a month, Disp: , Rfl:    vitamin E 1000 UNIT capsule, Take 1,000 Units by mouth daily., Disp: , Rfl:    acetaminophen (TYLENOL) 325 MG tablet, Take 650 mg by mouth every 8 (eight) hours as needed for mild pain. (Patient not taking: Reported on 06/24/2022), Disp: , Rfl:    lenalidomide (REVLIMID) 10 MG capsule, Take 1 capsule (10 mg total) by mouth daily. [employer] Auth # [id number]     Date Obtained 09/18/22, Disp: 28 capsule, Rfl: 0   ondansetron (ZOFRAN) 8 MG tablet, Take 1 tablet (8 mg total) by mouth every 8 (eight) hours as needed., Disp: 20 tablet, Rfl: 2  Physical exam:  Vitals:   09/18/22 0907  BP: 136/65  Pulse: 95  Resp: 19  Temp: 97.6 F (36.4 C)  SpO2: 98%  Weight: 163 lb 1.6 oz (74 kg)   Physical Exam HENT:     Mouth/Throat:     Comments: Scar of recent jaw  surgery and debridement seen.  Sinus tracts are no longer evident Cardiovascular:     Rate and Rhythm: Normal rate and regular rhythm.     Heart sounds: Normal heart sounds.  Pulmonary:     Effort: Pulmonary effort is normal.     Breath sounds: Normal breath sounds.  Abdominal:     General: Bowel sounds are normal.     Palpations: Abdomen is soft.  Skin:    General: Skin is warm and dry.  Neurological:     Mental Status: She is alert and oriented to person, place, and time.         Latest Ref Rng & Units 02/03/2022    9:18 AM  CMP  Glucose 70 - 99 mg/dL 540   BUN 8 - 23 mg/dL 21   Creatinine 9.81 - 1.00 mg/dL 1.91   Sodium 478 - 295 mmol/L 138   Potassium 3.5 - 5.1 mmol/L 4.4   Chloride 98 - 111 mmol/L 102   CO2 22 - 32 mmol/L 28   Calcium 8.9 - 10.3 mg/dL 9.6   Total Protein 6.5 - 8.1 g/dL 6.7   Total Bilirubin 0.3 - 1.2 mg/dL 0.4   Alkaline Phos 38 - 126 U/L 78   AST 15 - 41 U/L 15   ALT 0 - 44 U/L 12       Latest Ref Rng & Units 09/18/2022    8:49 AM  CBC  WBC 4.0 - 10.5 K/uL 8.2   Hemoglobin 12.0 - 15.0 g/dL 62.1   Hematocrit 30.8 - 46.0 % 41.4   Platelets 150 - 400 K/uL 263      Assessment and plan- Patient is a 77 y.o. 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  She is here for routine follow-up of multiple myeloma  Patient has not received any treatment for her myeloma since February 2024.  Treatment was on hold due to ongoing issues with osteonecrosis of the jaw.  Does have presently resolved.  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  Overall her myeloma has remained stable over the years.Serum free light chain ratio is normal and M protein has remained stable between not detected to 0.1 g.  [facility] ID did clear her to restart treatment for her myeloma.  However given her stable disease I would like to hold off on giving her any further Darzalex  at this time.  I will continue with maintenance Revlimid alone.  She will take 15 mg 3 weeks on and 1 week off for this cycle since she already has a supply at home.  From next month onwards she will go on 10 mg Revlimid dosing continuous without any breaks.  I will see her back in 1 month with labs   Visit Diagnosis 1. High risk medication use   2. Multiple myeloma not having achieved remission (HCC)      Dr. Owens Shark, MD, MPH [facility] at [facility] [phone] 09/18/2022 12:31 PM

## 2022-09-23 NOTE — Telephone Encounter (Signed)
Spoke with patient, she is aware that the Rx for Revlimid 10mg  was sent to [facility]. Per MD instructions, she is taking 15mg  she has on hand for 21 days on and 7 days off, then transition to 10mg  daily maintenance therapy.

## 2022-09-27 ENCOUNTER — Other Ambulatory Visit: Payer: Self-pay

## 2022-10-14 ENCOUNTER — Encounter: Payer: Self-pay | Admitting: Oncology

## 2022-10-14 ENCOUNTER — Ambulatory Visit: Payer: Medicare HMO

## 2022-10-14 ENCOUNTER — Inpatient Hospital Stay (HOSPITAL_BASED_OUTPATIENT_CLINIC_OR_DEPARTMENT_OTHER): Payer: Medicare HMO | Admitting: Oncology

## 2022-10-14 ENCOUNTER — Inpatient Hospital Stay: Payer: Medicare HMO | Attending: Oncology

## 2022-10-14 VITALS — BP 135/68 | HR 83 | Temp 97.6°F | Resp 18 | Ht 63.0 in | Wt 162.9 lb

## 2022-10-14 DIAGNOSIS — Z79624 Long term (current) use of inhibitors of nucleotide synthesis: Secondary | ICD-10-CM | POA: Insufficient documentation

## 2022-10-14 DIAGNOSIS — C9000 Multiple myeloma not having achieved remission: Secondary | ICD-10-CM | POA: Diagnosis present

## 2022-10-14 DIAGNOSIS — Z7982 Long term (current) use of aspirin: Secondary | ICD-10-CM | POA: Insufficient documentation

## 2022-10-14 DIAGNOSIS — Z801 Family history of malignant neoplasm of trachea, bronchus and lung: Secondary | ICD-10-CM | POA: Insufficient documentation

## 2022-10-14 DIAGNOSIS — Z79899 Other long term (current) drug therapy: Secondary | ICD-10-CM

## 2022-10-14 DIAGNOSIS — Z803 Family history of malignant neoplasm of breast: Secondary | ICD-10-CM | POA: Diagnosis not present

## 2022-10-14 DIAGNOSIS — Z808 Family history of malignant neoplasm of other organs or systems: Secondary | ICD-10-CM | POA: Diagnosis not present

## 2022-10-14 DIAGNOSIS — E538 Deficiency of other specified B group vitamins: Secondary | ICD-10-CM | POA: Insufficient documentation

## 2022-10-14 DIAGNOSIS — Z8042 Family history of malignant neoplasm of prostate: Secondary | ICD-10-CM | POA: Insufficient documentation

## 2022-10-14 LAB — CBC WITH DIFFERENTIAL (CANCER CENTER ONLY)
Abs Immature Granulocytes: 0.03 10*3/uL (ref 0.00–0.07)
Basophils Absolute: 0.1 10*3/uL (ref 0.0–0.1)
Basophils Relative: 1 %
Eosinophils Absolute: 0.3 10*3/uL (ref 0.0–0.5)
Eosinophils Relative: 6 %
HCT: 38.3 % (ref 36.0–46.0)
Hemoglobin: 12.6 g/dL (ref 12.0–15.0)
Immature Granulocytes: 1 %
Lymphocytes Relative: 15 %
Lymphs Abs: 0.7 10*3/uL (ref 0.7–4.0)
MCH: 31.3 pg (ref 26.0–34.0)
MCHC: 32.9 g/dL (ref 30.0–36.0)
MCV: 95 fL (ref 80.0–100.0)
Monocytes Absolute: 0.4 10*3/uL (ref 0.1–1.0)
Monocytes Relative: 9 %
Neutro Abs: 3.1 10*3/uL (ref 1.7–7.7)
Neutrophils Relative %: 68 %
Platelet Count: 246 10*3/uL (ref 150–400)
RBC: 4.03 MIL/uL (ref 3.87–5.11)
RDW: 12.5 % (ref 11.5–15.5)
WBC Count: 4.5 10*3/uL (ref 4.0–10.5)
nRBC: 0 % (ref 0.0–0.2)

## 2022-10-14 LAB — CMP (CANCER CENTER ONLY)
ALT: 16 U/L (ref 0–44)
AST: 15 U/L (ref 15–41)
Albumin: 3.6 g/dL (ref 3.5–5.0)
Alkaline Phosphatase: 79 U/L (ref 38–126)
Anion gap: 7 (ref 5–15)
BUN: 15 mg/dL (ref 8–23)
CO2: 27 mmol/L (ref 22–32)
Calcium: 7.4 mg/dL — ABNORMAL LOW (ref 8.9–10.3)
Chloride: 108 mmol/L (ref 98–111)
Creatinine: 0.99 mg/dL (ref 0.44–1.00)
GFR, Estimated: 59 mL/min — ABNORMAL LOW (ref >60)
Glucose, Bld: 96 mg/dL (ref 70–99)
Potassium: 3.6 mmol/L (ref 3.5–5.1)
Sodium: 142 mmol/L (ref 135–145)
Total Bilirubin: 0.2 mg/dL — ABNORMAL LOW (ref 0.3–1.2)
Total Protein: 5.7 g/dL — ABNORMAL LOW (ref 6.5–8.1)

## 2022-10-14 NOTE — Progress Notes (Signed)
Hematology/Oncology Consult note [facility]  Telephone:([phone] [phone]  Patient Care Team: Hillery Aldo, MD as PCP - General (Family Medicine) Creig Hines, MD as Consulting Physician (Hematology and Oncology)   Name of the patient: Laurie [name]  [id number]  1946-01-29   Date of visit: 10/14/22  Diagnosis- stage III biclonal IgA multiple myeloma    Chief complaint/ Reason for visit-routine follow-up of multiple myeloma  Heme/Onc history: Patient is a 77 year old female who was diagnosed with stage III biclonal IgA multiple myeloma in June 2021. SPEP was 5.0 gm/dL on 84/69/6295. Random urine revealed 196.6 mg/dL total protein with 28.4% M-spike.  Bone marrow biopsy on 11/13/2019 revealed a hypercellular bone marrow with extensive involvement by plasma cell neoplasm. Atypical plasma cells represented 70% of all cells in the aspirate and was associated with prominent interstitial infiltrates and diffuse sheets in the clot.  Plasma cells were kappa light chain restricted.   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   Bone survey on 10/26/2019 revealed small lytic lesions in the skull. There was no acute fracture.   Patient has been on daratumumab Revlimid dexamethasone regimen since July 2021.  She briefly received Retacrit in the past but has not required it for over 1 year now.  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 Mauri [facility] at [facility].  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  Denies any difficulty swallowing.  She is completing her course of Augmentin for actinomycosis  ECOG PS- 1 Pain scale- 0   Review of systems- Review of Systems  Constitutional:  Negative for chills, fever, malaise/fatigue and weight loss.  HENT:  Negative for congestion, ear discharge and nosebleeds.   Eyes:  Negative for blurred vision.  Respiratory:  Negative for cough, hemoptysis, sputum production, shortness of breath and wheezing.   Cardiovascular:  Negative for chest pain, palpitations, orthopnea and claudication.  Gastrointestinal:  Negative for abdominal pain, blood in stool, constipation, diarrhea, heartburn, melena, nausea and vomiting.  Genitourinary:  Negative for dysuria, flank pain, frequency, hematuria and urgency.  Musculoskeletal:  Negative for back pain, joint pain and myalgias.  Skin:  Negative for rash.  Neurological:  Negative for dizziness, tingling, focal weakness, seizures, weakness and headaches.  Endo/Heme/Allergies:  Does not bruise/bleed easily.  Psychiatric/Behavioral:  Negative for depression and suicidal ideas. The patient does not have insomnia.       Allergies  Allergen Reactions   Levofloxacin     Critical      Past Medical History:  Diagnosis Date   Anxiety    Asthma    Cellulitis and abscess of oral soft tissues    Cellulitis and abscess of oral soft tissues 03/31/2021   pt in hospital and now out as outpt   Depression    Head injury    Multiple myeloma (HCC)    Osteoporosis    PONV (postoperative nausea and vomiting)  Past Surgical History:  Procedure Laterality Date   ABDOMINAL HYSTERECTOMY     CATARACT EXTRACTION W/PHACO Right 07/27/2022   Procedure: CATARACT EXTRACTION PHACO AND INTRAOCULAR LENS PLACEMENT (IOC) RIGHT VISION BLUE;  Surgeon: Estanislado Pandy, MD;   Location: [facility];  Service: Ophthalmology;  Laterality: Right;  21.53 2:00.6   HEMORROIDECTOMY     MOUTH SURGERY     SEPTOPLASTY      Social History   Socioeconomic History   Marital status: Widowed    Spouse name: Not on file   Number of children: Not on file   Years of education: Not on file   Highest education level: Not on file  Occupational History   Not on file  Tobacco Use   Smoking status: Never   Smokeless tobacco: Never  Vaping Use   Vaping Use: Never used  Substance and Sexual Activity   Alcohol use: No   Drug use: No   Sexual activity: Not Currently  Other Topics Concern   Not on file  Social History Narrative   Jehovah Witness    Social Determinants of Health   Financial Resource Strain: Not on file  Food Insecurity: Not on file  Transportation Needs: Not on file  Physical Activity: Not on file  Stress: Not on file  Social Connections: Not on file  Intimate Partner Violence: Not on file    Family History  Problem Relation Age of Onset   Prostate cancer Brother    Throat cancer Maternal Aunt    Breast cancer Cousin        mat cousin   Breast cancer Cousin    Thyroid cancer Other      Current Outpatient Medications:    acyclovir (ZOVIRAX) 400 MG tablet, TAKE 1 TABLET BY MOUTH TWICE A DAY, Disp: 180 tablet, Rfl: 3   albuterol (VENTOLIN HFA) 108 (90 Base) MCG/ACT inhaler, 2 inhalations every 4 (four) hours as needed., Disp: , Rfl:    amoxicillin (AMOXIL) 500 MG capsule, Take by mouth., Disp: , Rfl:    amoxicillin-clavulanate (AUGMENTIN) 875-125 MG tablet, Take 1 tablet by mouth 2 (two) times daily., Disp: , Rfl:    aspirin 81 MG chewable tablet, Chew by mouth daily., Disp: , Rfl:    atorvastatin (LIPITOR) 20 MG tablet, Take 20 mg by mouth daily., Disp: , Rfl:    busPIRone (BUSPAR) 10 MG tablet, Take 10 mg by mouth 2 (two) times daily., Disp: , Rfl:    Calcium Carb-Cholecalciferol (CALCIUM 600 + D PO), Take 600 mg by mouth 4 (four)  times daily. Increase calcium from 600mg  4 times a day to 600mg  5 times a day starting today, Disp: , Rfl:    chlorhexidine (PERIDEX) 0.12 % solution, 15 mLs 2 (two) times daily., Disp: , Rfl:    dexamethasone (DECADRON) 4 MG tablet, Take 5 tablets (20 mg total) by mouth once a week., Disp: 120 tablet, Rfl: 2   docusate sodium (COLACE) 100 MG capsule, Take 100 mg by mouth 2 (two) times daily., Disp: , Rfl:    lenalidomide (REVLIMID) 10 MG capsule, Take 1 capsule (10 mg total) by mouth daily. [employer] Auth # [id number]     Date Obtained 09/18/22, Disp: 28 capsule, Rfl: 0   montelukast (SINGULAIR) 10 MG tablet, Take 10 mg by mouth daily after breakfast., Disp: , Rfl:    nortriptyline (PAMELOR) 50 MG capsule, Take 50 mg by mouth at bedtime., Disp: , Rfl:    nortriptyline (PAMELOR) 50 MG capsule, Take 50  mg by mouth at bedtime., Disp: , Rfl:    omeprazole (PRILOSEC) 20 MG capsule, Take 20 mg by mouth daily., Disp: , Rfl:    ondansetron (ZOFRAN) 8 MG tablet, Take 1 tablet (8 mg total) by mouth every 8 (eight) hours as needed., Disp: 20 tablet, Rfl: 2   pentoxifylline (TRENTAL) 400 MG CR tablet, Take 400 mg by mouth 2 (two) times daily., Disp: , Rfl:    sertraline (ZOLOFT) 100 MG tablet, Take 100 mg by mouth 2 (two) times daily., Disp: , Rfl:    traZODone (DESYREL) 50 MG tablet, Take 50 mg by mouth at bedtime., Disp: , Rfl:    Vitamin D, Ergocalciferol, (DRISDOL) 50000 UNITS CAPS capsule, Take 50,000 Units by mouth every 30 (thirty) days. Once a month, Disp: , Rfl:    vitamin E 1000 UNIT capsule, Take 1,000 Units by mouth daily., Disp: , Rfl:    acetaminophen (TYLENOL) 325 MG tablet, Take 650 mg by mouth every 8 (eight) hours as needed for mild pain. (Patient not taking: Reported on 06/24/2022), Disp: , Rfl:   Physical exam:  Vitals:   10/14/22 0920  BP: 135/68  Pulse: 83  Resp: 18  Temp: 97.6 F (36.4 C)  TempSrc: Tympanic  SpO2: 100%  Weight: 162 lb 14.4 oz (73.9 kg)  Height: 5\' 3"  (1.6 m)    Physical Exam Constitutional:      Comments: Ambulates with a walker.  Appears in no acute distress  Cardiovascular:     Rate and Rhythm: Normal rate and regular rhythm.     Heart sounds: Normal heart sounds.  Pulmonary:     Effort: Pulmonary effort is normal.     Breath sounds: Normal breath sounds.  Abdominal:     General: Bowel sounds are normal.     Palpations: Abdomen is soft.  Skin:    General: Skin is warm and dry.  Neurological:     Mental Status: She is alert and oriented to person, place, and time.         Latest Ref Rng & Units 10/14/2022    8:55 AM  CMP  Glucose 70 - 99 mg/dL 96   BUN 8 - 23 mg/dL 15   Creatinine 4.33 - 1.00 mg/dL 2.95   Sodium 188 - 416 mmol/L 142   Potassium 3.5 - 5.1 mmol/L 3.6   Chloride 98 - 111 mmol/L 108   CO2 22 - 32 mmol/L 27   Calcium 8.9 - 10.3 mg/dL 7.4   Total Protein 6.5 - 8.1 g/dL 5.7   Total Bilirubin 0.3 - 1.2 mg/dL 0.2   Alkaline Phos 38 - 126 U/L 79   AST 15 - 41 U/L 15   ALT 0 - 44 U/L 16       Latest Ref Rng & Units 10/14/2022    8:54 AM  CBC  WBC 4.0 - 10.5 K/uL 4.5   Hemoglobin 12.0 - 15.0 g/dL 60.6   Hematocrit 30.1 - 46.0 % 38.3   Platelets 150 - 400 K/uL 246      Assessment and plan- Patient is a 77 y.o. 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  She is here for routine follow-up of multiple myeloma on Revlimid  Serum free light remains normal and M protein remains undetectable to fluctuating up to 0.3 g.  Plan is to continue with single agent Revlimid alone and starting this cycle she will be taking 10 mg without any breaks.  CBC with differential CMP myeloma  panel and serum free light chains in 2 months and see Dr. Smith Robert   Visit Diagnosis 1. High risk medication use   2. Multiple myeloma not having achieved remission (HCC)      Dr. Owens Shark, MD, MPH [facility] at [facility] [phone] 10/14/2022 12:24 PM

## 2022-10-15 ENCOUNTER — Other Ambulatory Visit: Payer: Self-pay

## 2022-10-15 LAB — KAPPA/LAMBDA LIGHT CHAINS
Kappa free light chain: 4.6 mg/L (ref 3.3–19.4)
Kappa, lambda light chain ratio: 1.59 (ref 0.26–1.65)
Lambda free light chains: 2.9 mg/L — ABNORMAL LOW (ref 5.7–26.3)

## 2022-10-19 LAB — PROTEIN ELECTROPHORESIS, SERUM
A/G Ratio: 2.1 — ABNORMAL HIGH (ref 0.7–1.7)
Albumin ELP: 3.4 g/dL (ref 2.9–4.4)
Alpha-1-Globulin: 0.2 g/dL (ref 0.0–0.4)
Alpha-2-Globulin: 0.5 g/dL (ref 0.4–1.0)
Beta Globulin: 0.7 g/dL (ref 0.7–1.3)
Gamma Globulin: 0.2 g/dL — ABNORMAL LOW (ref 0.4–1.8)
Globulin, Total: 1.6 g/dL — ABNORMAL LOW (ref 2.2–3.9)
Total Protein ELP: 5 g/dL — ABNORMAL LOW (ref 6.0–8.5)

## 2022-10-20 ENCOUNTER — Other Ambulatory Visit: Payer: Self-pay

## 2022-10-20 LAB — MULTIPLE MYELOMA PANEL, SERUM
Albumin SerPl Elph-Mcnc: 3.4 g/dL (ref 2.9–4.4)
Albumin/Glob SerPl: 2.1 — ABNORMAL HIGH (ref 0.7–1.7)
Alpha 1: 0.2 g/dL (ref 0.0–0.4)
Alpha2 Glob SerPl Elph-Mcnc: 0.6 g/dL (ref 0.4–1.0)
B-Globulin SerPl Elph-Mcnc: 0.8 g/dL (ref 0.7–1.3)
Gamma Glob SerPl Elph-Mcnc: 0.1 g/dL — ABNORMAL LOW (ref 0.4–1.8)
Globulin, Total: 1.7 g/dL — ABNORMAL LOW (ref 2.2–3.9)
IgA: 11 mg/dL — ABNORMAL LOW (ref 64–422)
IgG (Immunoglobin G), Serum: 208 mg/dL — ABNORMAL LOW (ref 586–1602)
IgM (Immunoglobulin M), Srm: 10 mg/dL — ABNORMAL LOW (ref 26–217)
Total Protein ELP: 5.1 g/dL — ABNORMAL LOW (ref 6.0–8.5)

## 2022-11-09 ENCOUNTER — Other Ambulatory Visit: Payer: Self-pay | Admitting: *Deleted

## 2022-11-09 DIAGNOSIS — C9001 Multiple myeloma in remission: Secondary | ICD-10-CM

## 2022-11-09 MED ORDER — LENALIDOMIDE 10 MG PO CAPS
10.0000 mg | ORAL_CAPSULE | Freq: Every day | ORAL | 0 refills | Status: DC
Start: 2022-11-09 — End: 2022-12-03

## 2022-11-13 IMAGING — CR DG BONE SURVEY MET
1 series · 9 of 10 positions shown · non-contrast
Comparison: 10/26/2019

CLINICAL DATA: Multiple myeloma

EXAM:
METASTATIC BONE SURVEY

[Series 1: dg bone survey met · 0.14mm/px · 9 of 22 slices shown]
[im 1/22]
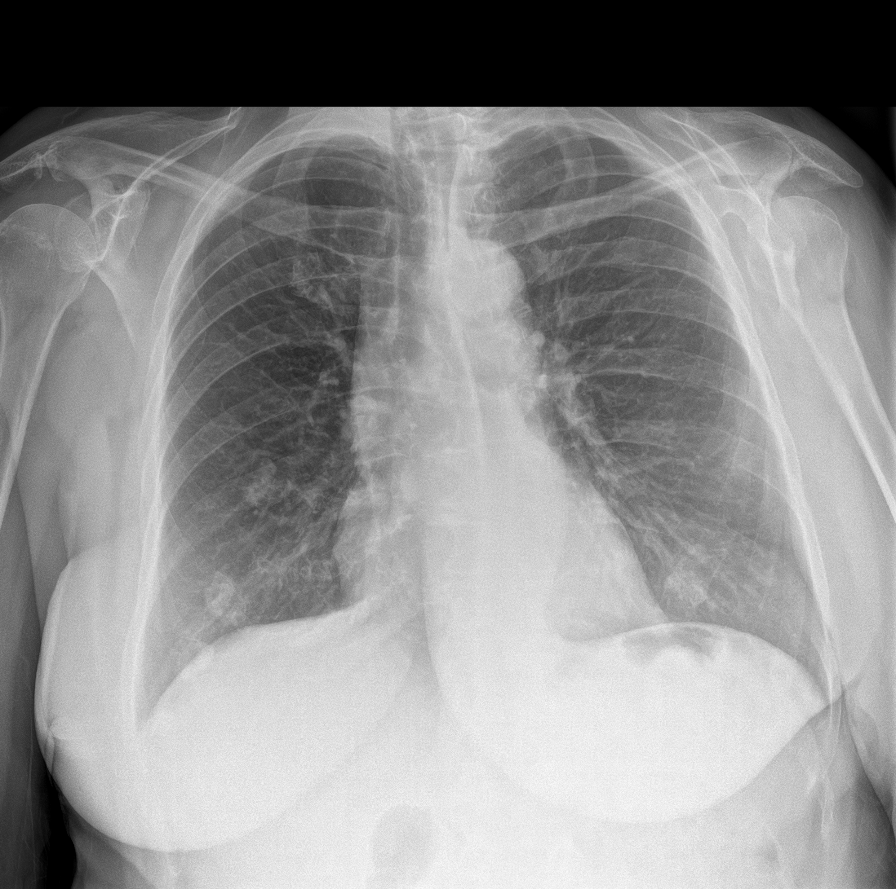
[im 3/22]
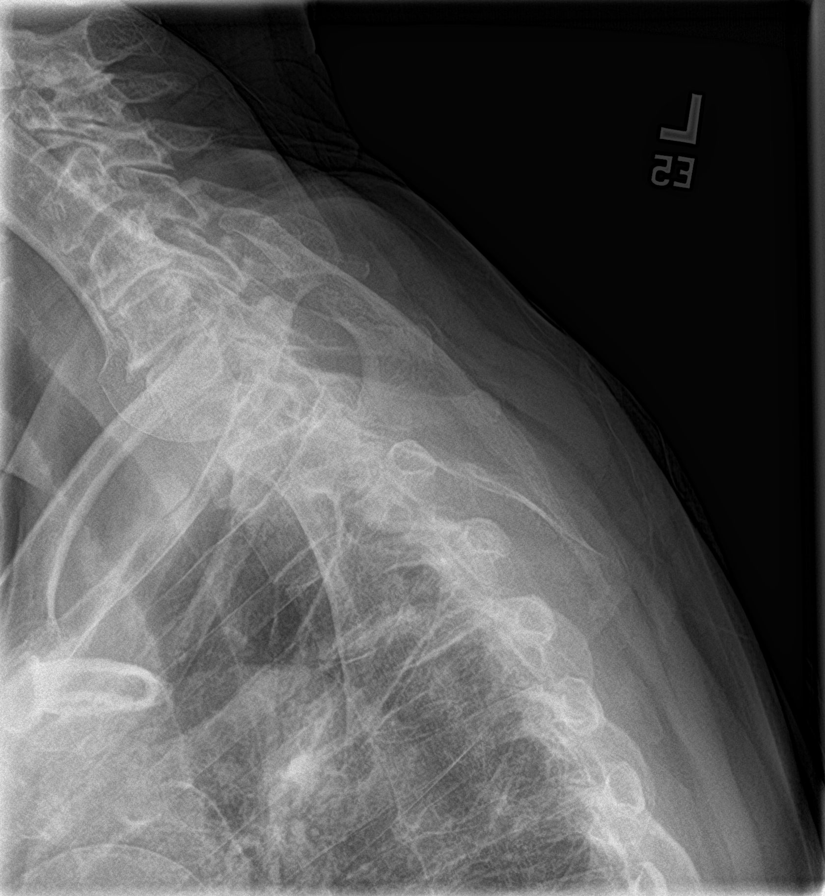
[im 5/22]
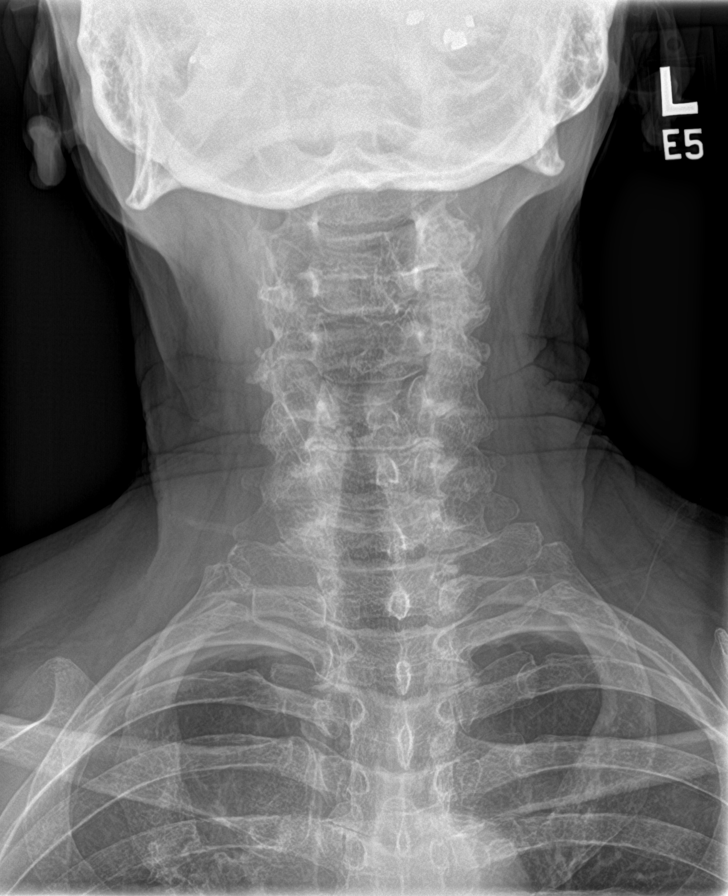
[im 8/22]
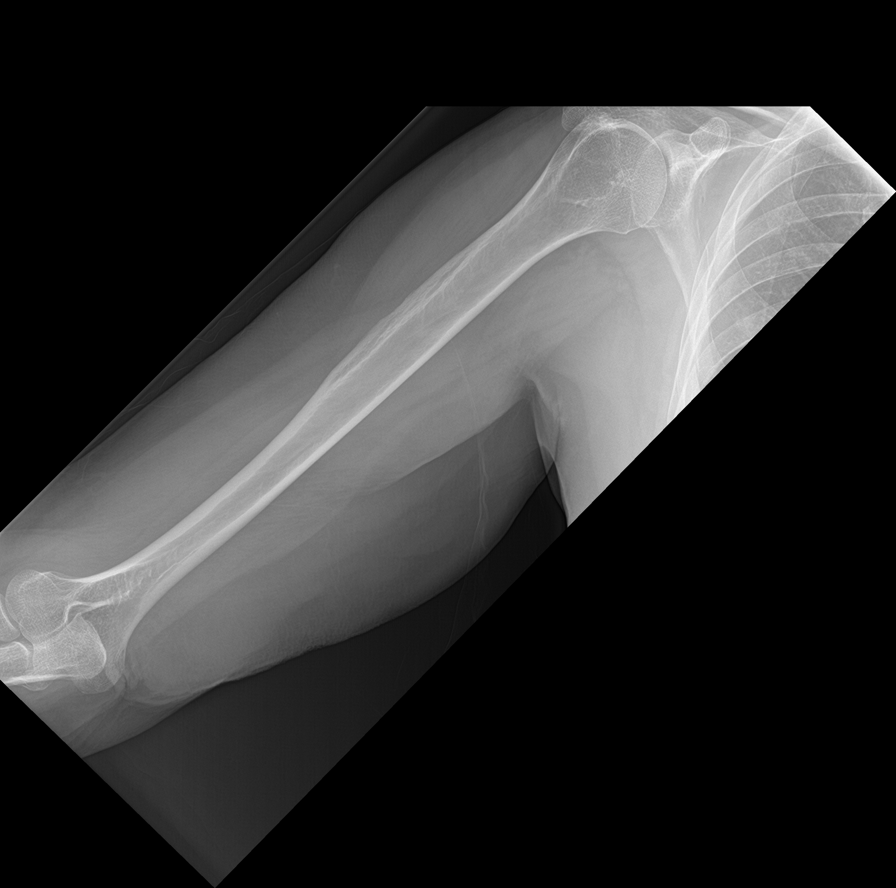
[im 10/22]
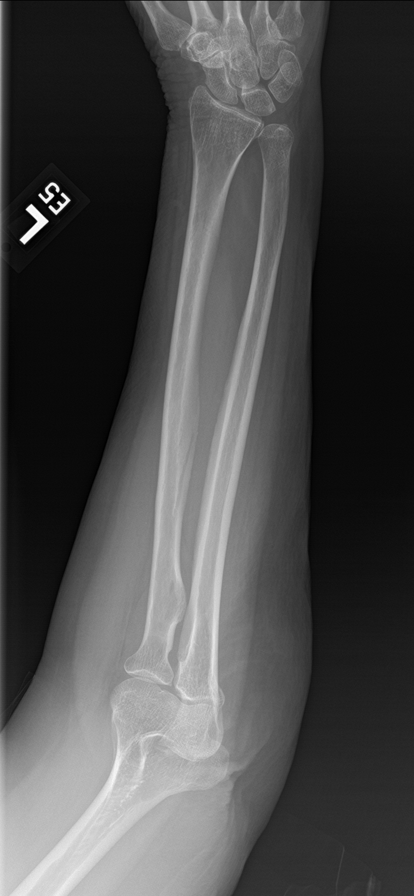
[im 12/22]
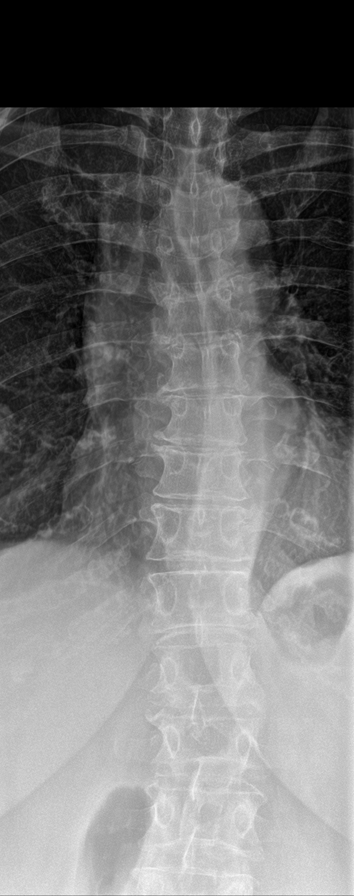
[im 15/22]
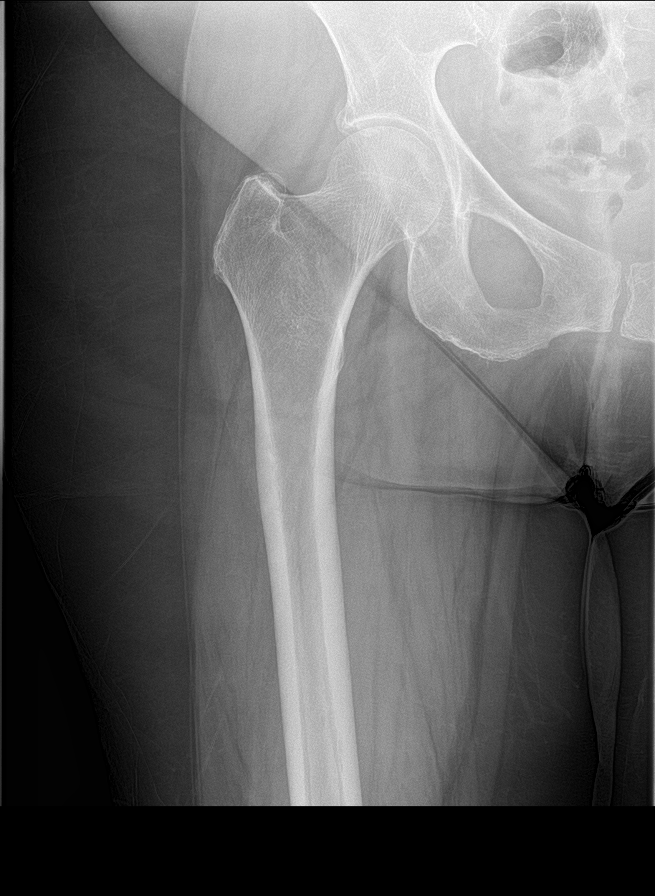
[im 17/22]
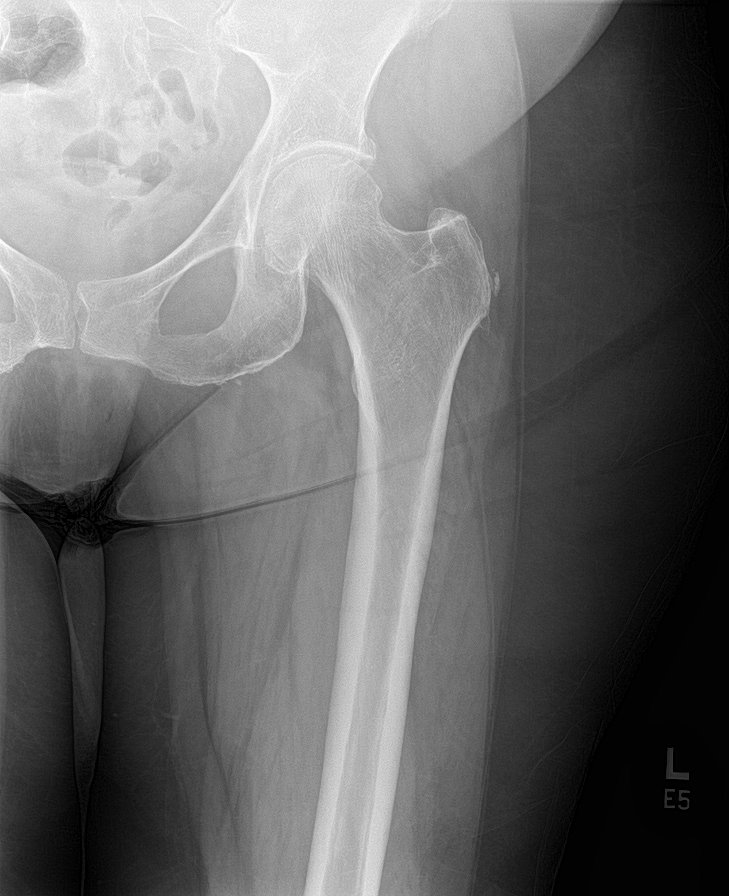
[im 19/22]
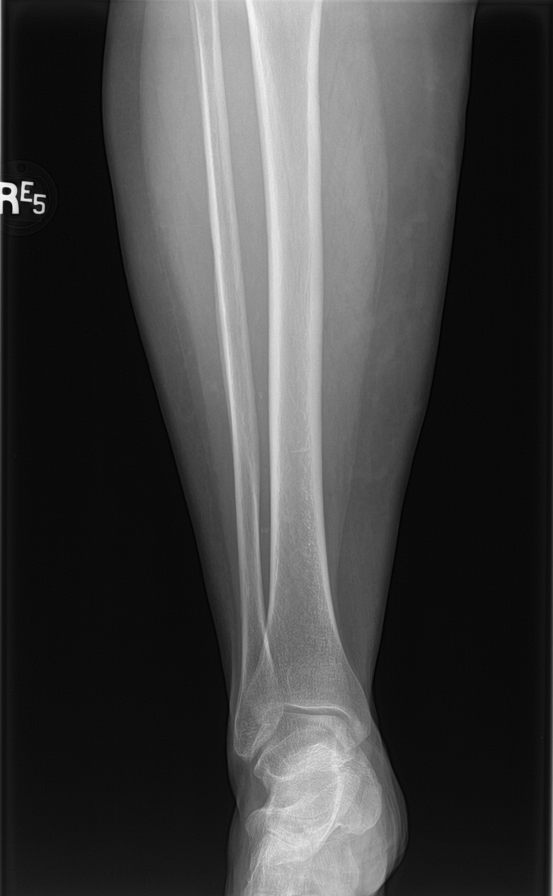

[9 of 10 positions shown; findings below may reference images not displayed]

FINDINGS: Metastatic bone survey was performed.

A view of the chest demonstrates increased sclerosis in the rib cage
bilaterally related to prior fractures with healing. No definitive
lytic lesions are noted within the ribcage.

Cervical spine demonstrates mild degenerative change without lytic
or sclerotic lesions.

The skull demonstrates multiple lytic lesions which appear increased
in both size and number when compared with the prior study.

Upper extremities show no lytic lesions. No acute fractures are
noted.

Thoracic spine demonstrates the pedicles to be within normal limits.
No compression deformity is noted. Osteophytic changes are seen. No
paraspinal mass is noted.

Lumbar spine shows 5 lumbar vertebra. No lytic or sclerotic lesions
are seen. Pelvis and lower extremities show degenerative changes of
the hip and knee joints right slightly greater than left. No lytic
or sclerotic foci are noted.
IMPRESSION: Multiple lytic lesions within the skull which appear increased in
size and number.

Healing rib fractures.

No other focal lytic or sclerotic lesions are seen.

## 2022-12-03 ENCOUNTER — Other Ambulatory Visit: Payer: Self-pay

## 2022-12-03 DIAGNOSIS — C9001 Multiple myeloma in remission: Secondary | ICD-10-CM

## 2022-12-04 ENCOUNTER — Encounter: Payer: Self-pay | Admitting: Hematology and Oncology

## 2022-12-04 ENCOUNTER — Encounter: Payer: Self-pay | Admitting: Oncology

## 2022-12-04 MED ORDER — LENALIDOMIDE 10 MG PO CAPS
10.0000 mg | ORAL_CAPSULE | Freq: Every day | ORAL | 0 refills | Status: DC
Start: 2022-12-04 — End: 2023-01-05

## 2022-12-14 ENCOUNTER — Ambulatory Visit: Payer: Medicare HMO | Admitting: Oncology

## 2022-12-14 ENCOUNTER — Other Ambulatory Visit: Payer: Medicare HMO

## 2022-12-14 ENCOUNTER — Encounter: Payer: Self-pay | Admitting: Oncology

## 2022-12-14 ENCOUNTER — Inpatient Hospital Stay: Payer: Medicare HMO | Attending: Oncology

## 2022-12-14 ENCOUNTER — Inpatient Hospital Stay (HOSPITAL_BASED_OUTPATIENT_CLINIC_OR_DEPARTMENT_OTHER): Payer: Medicare HMO | Admitting: Oncology

## 2022-12-14 VITALS — BP 143/70 | HR 85 | Temp 95.9°F | Resp 18 | Ht 63.0 in | Wt 157.5 lb

## 2022-12-14 DIAGNOSIS — C9001 Multiple myeloma in remission: Secondary | ICD-10-CM

## 2022-12-14 DIAGNOSIS — Z7961 Long term (current) use of immunomodulator: Secondary | ICD-10-CM | POA: Diagnosis not present

## 2022-12-14 DIAGNOSIS — Z7982 Long term (current) use of aspirin: Secondary | ICD-10-CM | POA: Insufficient documentation

## 2022-12-14 DIAGNOSIS — Z79899 Other long term (current) drug therapy: Secondary | ICD-10-CM

## 2022-12-14 DIAGNOSIS — Z79624 Long term (current) use of inhibitors of nucleotide synthesis: Secondary | ICD-10-CM | POA: Diagnosis not present

## 2022-12-14 DIAGNOSIS — E538 Deficiency of other specified B group vitamins: Secondary | ICD-10-CM | POA: Diagnosis present

## 2022-12-14 DIAGNOSIS — C9000 Multiple myeloma not having achieved remission: Secondary | ICD-10-CM

## 2022-12-14 LAB — CBC WITH DIFFERENTIAL/PLATELET
Abs Immature Granulocytes: 0.02 10*3/uL (ref 0.00–0.07)
Basophils Absolute: 0.1 10*3/uL (ref 0.0–0.1)
Basophils Relative: 1 %
Eosinophils Absolute: 0.6 10*3/uL — ABNORMAL HIGH (ref 0.0–0.5)
Eosinophils Relative: 15 %
HCT: 40.6 % (ref 36.0–46.0)
Hemoglobin: 13.4 g/dL (ref 12.0–15.0)
Immature Granulocytes: 1 %
Lymphocytes Relative: 20 %
Lymphs Abs: 0.8 10*3/uL (ref 0.7–4.0)
MCH: 30.6 pg (ref 26.0–34.0)
MCHC: 33 g/dL (ref 30.0–36.0)
MCV: 92.7 fL (ref 80.0–100.0)
Monocytes Absolute: 0.4 10*3/uL (ref 0.1–1.0)
Monocytes Relative: 9 %
Neutro Abs: 2.1 10*3/uL (ref 1.7–7.7)
Neutrophils Relative %: 54 %
Platelets: 197 10*3/uL (ref 150–400)
RBC: 4.38 MIL/uL (ref 3.87–5.11)
RDW: 13.4 % (ref 11.5–15.5)
WBC: 3.9 10*3/uL — ABNORMAL LOW (ref 4.0–10.5)
nRBC: 0 % (ref 0.0–0.2)

## 2022-12-14 LAB — COMPREHENSIVE METABOLIC PANEL
ALT: 17 U/L (ref 0–44)
AST: 15 U/L (ref 15–41)
Albumin: 3.9 g/dL (ref 3.5–5.0)
Alkaline Phosphatase: 90 U/L (ref 38–126)
Anion gap: 10 (ref 5–15)
BUN: 15 mg/dL (ref 8–23)
CO2: 27 mmol/L (ref 22–32)
Calcium: 9.2 mg/dL (ref 8.9–10.3)
Chloride: 101 mmol/L (ref 98–111)
Creatinine, Ser: 0.95 mg/dL (ref 0.44–1.00)
GFR, Estimated: >60 mL/min (ref >60)
Glucose, Bld: 83 mg/dL (ref 70–99)
Potassium: 3.8 mmol/L (ref 3.5–5.1)
Sodium: 138 mmol/L (ref 135–145)
Total Bilirubin: 0.6 mg/dL (ref 0.3–1.2)
Total Protein: 6 g/dL — ABNORMAL LOW (ref 6.5–8.1)

## 2022-12-14 NOTE — Progress Notes (Signed)
Survivorship Care Plan visit completed.  Treatment summary reviewed and given to patient.  ASCO answers booklet reviewed and given to patient.  CARE program and Cancer Transitions discussed with patient along with other resources cancer center offers to patients and caregivers.  Patient verbalized understanding.    

## 2022-12-15 ENCOUNTER — Other Ambulatory Visit: Payer: Self-pay

## 2022-12-20 ENCOUNTER — Encounter: Payer: Self-pay | Admitting: Hematology and Oncology

## 2022-12-20 ENCOUNTER — Encounter: Payer: Self-pay | Admitting: Oncology

## 2022-12-20 NOTE — Progress Notes (Signed)
Hematology/Oncology Consult note [facility]  Telephone:([phone] Fax:(336) [phone]  Patient Care Team: Hillery Aldo, MD as PCP - General (Family Medicine) Creig Hines, MD as Consulting Physician (Hematology and Oncology)   Name of the patient: Laurie [name]  [id number]  [birth year]   Date of visit: 12/20/22  Diagnosis- h/o multiple myeloma in remission  Chief complaint/ Reason for visit- routine f/u of multiple myeloma on revlimid  Heme/Onc history: Patient is a 77 year old female who was diagnosed with stage III biclonal IgA multiple myeloma in June 2021. SPEP was 5.0 gm/dL on 46/96/2952. Random urine revealed 196.6 mg/dL total protein with 84.1% M-spike.  Bone marrow biopsy on 11/13/2019 revealed a hypercellular bone marrow with extensive involvement by plasma cell neoplasm. Atypical plasma cells represented 70% of all cells in the aspirate and was associated with prominent interstitial infiltrates and diffuse sheets in the clot.  Plasma cells were kappa light chain restricted.   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   Bone survey on 10/26/2019 revealed small lytic lesions in the skull. There was no acute fracture.   Patient has been on daratumumab Revlimid dexamethasone regimen since July 2021.  She briefly received Retacrit in the past but has not required it for over 1 year now.  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 Mauri Pole at [facility].  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 Denies any specific complaints today  ECOG PS- 1 Pain scale- 0  Review of systems- Review of Systems  Constitutional:  Negative for chills, fever, malaise/fatigue and weight loss.  HENT:  Negative for congestion, ear discharge and nosebleeds.   Eyes:  Negative for blurred vision.  Respiratory:  Negative for cough, hemoptysis, sputum production, shortness of breath and wheezing.   Cardiovascular:  Negative for chest pain, palpitations, orthopnea and claudication.  Gastrointestinal:  Negative for abdominal pain, blood in stool, constipation, diarrhea, heartburn, melena, nausea and vomiting.  Genitourinary:  Negative for dysuria, flank pain, frequency, hematuria and urgency.  Musculoskeletal:  Negative for back pain, joint pain and myalgias.  Skin:  Negative for rash.  Neurological:  Negative for dizziness, tingling, focal weakness, seizures, weakness and headaches.  Endo/Heme/Allergies:  Does not bruise/bleed easily.  Psychiatric/Behavioral:  Negative for depression and suicidal ideas. The patient does not have insomnia.       Allergies  Allergen Reactions   Levofloxacin     Critical      Past Medical History:  Diagnosis Date   Anxiety    Asthma    Cellulitis and abscess of oral soft tissues    Cellulitis and abscess of oral soft tissues 03/31/2021   pt in hospital and now out as outpt   Depression    Head injury    Multiple myeloma (HCC)    Osteoporosis    PONV (postoperative nausea and vomiting)      Past Surgical History:  Procedure Laterality  Date   ABDOMINAL HYSTERECTOMY     CATARACT EXTRACTION W/PHACO Right 07/27/2022   Procedure: CATARACT EXTRACTION PHACO AND INTRAOCULAR LENS PLACEMENT (IOC) RIGHT VISION BLUE;  Surgeon: Estanislado Pandy, MD;  Location: [facility];   Service: Ophthalmology;  Laterality: Right;  21.53 2:00.6   HEMORROIDECTOMY     MOUTH SURGERY     SEPTOPLASTY      Social History   Socioeconomic History   Marital status: Widowed    Spouse name: Not on file   Number of children: Not on file   Years of education: Not on file   Highest education level: Not on file  Occupational History   Not on file  Tobacco Use   Smoking status: Never   Smokeless tobacco: Never  Vaping Use   Vaping status: Never Used  Substance and Sexual Activity   Alcohol use: No   Drug use: No   Sexual activity: Not Currently  Other Topics Concern   Not on file  Social History Narrative   Jehovah Witness    Social Determinants of Health   Financial Resource Strain: Low Risk  (08/12/2022)   Received from [facility], [facility]   Overall Financial Resource Strain (CARDIA)    Difficulty of Paying Living Expenses: Not very hard  Food Insecurity: No Food Insecurity (08/12/2022)   Received from [facility], [facility]   Hunger Vital Sign    Worried About Running Out of Food in the Last Year: Never true    Ran Out of Food in the Last Year: Never true  Transportation Needs: No Transportation Needs (08/12/2022)   Received from [facility], [facility]   [facility] - Transportation    Lack of Transportation (Medical): No    Lack of Transportation (Non-Medical): No  Physical Activity: Not on file  Stress: Not on file  Social Connections: Not on file  Intimate Partner Violence: Not on file    Family History  Problem Relation Age of Onset   Prostate cancer Brother    Throat cancer Maternal Aunt    Breast cancer Cousin        mat cousin   Breast cancer Cousin    Thyroid cancer Other      Current Outpatient Medications:    acyclovir (ZOVIRAX) 400 MG tablet, TAKE 1 TABLET BY MOUTH TWICE A DAY, Disp: 180 tablet, Rfl: 3   albuterol (VENTOLIN HFA) 108 (90 Base) MCG/ACT inhaler, 2 inhalations every 4 (four) hours as needed., Disp:  , Rfl:    amoxicillin-clavulanate (AUGMENTIN) 875-125 MG tablet, Take 1 tablet by mouth 2 (two) times daily., Disp: , Rfl:    aspirin 81 MG chewable tablet, Chew by mouth daily., Disp: , Rfl:    atorvastatin (LIPITOR) 20 MG tablet, Take 20 mg by mouth daily., Disp: , Rfl:    busPIRone (BUSPAR) 10 MG tablet, Take 10 mg by mouth 2 (two) times daily., Disp: , Rfl:    Calcium Carb-Cholecalciferol (CALCIUM 600 + D PO), Take 600 mg by mouth 4 (four) times daily. Increase calcium from 600mg  4 times a day to 600mg  5 times a day starting today, Disp: , Rfl:    chlorhexidine (PERIDEX) 0.12 % solution, 15 mLs 2 (two) times daily., Disp: , Rfl:    dexamethasone (DECADRON) 4 MG tablet, Take 5 tablets (20 mg total) by mouth once a week., Disp: 120 tablet, Rfl: 2   docusate sodium (COLACE) 100 MG capsule, Take 100 mg by mouth 2 (  two) times daily., Disp: , Rfl:    lenalidomide (REVLIMID) 10 MG capsule, Take 1 capsule (10 mg total) by mouth daily. [employer] Auth # [id number]    Date Obtained 12/03/2021, Disp: 28 capsule, Rfl: 0   montelukast (SINGULAIR) 10 MG tablet, Take 10 mg by mouth daily after breakfast., Disp: , Rfl:    nortriptyline (PAMELOR) 50 MG capsule, Take 50 mg by mouth at bedtime., Disp: , Rfl:    omeprazole (PRILOSEC) 20 MG capsule, Take 20 mg by mouth daily., Disp: , Rfl:    ondansetron (ZOFRAN) 8 MG tablet, Take 1 tablet (8 mg total) by mouth every 8 (eight) hours as needed., Disp: 20 tablet, Rfl: 2   pentoxifylline (TRENTAL) 400 MG CR tablet, Take 400 mg by mouth 2 (two) times daily., Disp: , Rfl:    sertraline (ZOLOFT) 100 MG tablet, Take 100 mg by mouth 2 (two) times daily., Disp: , Rfl:    traZODone (DESYREL) 50 MG tablet, Take 50 mg by mouth at bedtime., Disp: , Rfl:    Vitamin D, Ergocalciferol, (DRISDOL) 50000 UNITS CAPS capsule, Take 50,000 Units by mouth every 30 (thirty) days. Once a month, Disp: , Rfl:    vitamin E 1000 UNIT capsule, Take 1,000 Units by mouth daily., Disp: , Rfl:     acetaminophen (TYLENOL) 325 MG tablet, Take 650 mg by mouth every 8 (eight) hours as needed for mild pain. (Patient not taking: Reported on 06/24/2022), Disp: , Rfl:   Physical exam:  Vitals:   12/14/22 1124  BP: (!) 143/70  Pulse: 85  Resp: 18  Temp: (!) 95.9 F (35.5 C)  TempSrc: Tympanic  SpO2: 97%  Weight: 157 lb 8 oz (71.4 kg)  Height: 5\' 3"  (1.6 m)   Physical Exam Constitutional:      General: She is not in acute distress. Cardiovascular:     Rate and Rhythm: Normal rate and regular rhythm.     Heart sounds: Normal heart sounds.  Pulmonary:     Effort: Pulmonary effort is normal.     Breath sounds: Normal breath sounds.  Abdominal:     General: Bowel sounds are normal.     Palpations: Abdomen is soft.  Skin:    General: Skin is warm and dry.  Neurological:     Mental Status: She is alert and oriented to person, place, and time.         Latest Ref Rng & Units 12/14/2022   11:13 AM  CMP  Glucose 70 - 99 mg/dL 83   BUN 8 - 23 mg/dL 15   Creatinine 2.95 - 1.00 mg/dL 2.84   Sodium 132 - 440 mmol/L 138   Potassium 3.5 - 5.1 mmol/L 3.8   Chloride 98 - 111 mmol/L 101   CO2 22 - 32 mmol/L 27   Calcium 8.9 - 10.3 mg/dL 9.2   Total Protein 6.5 - 8.1 g/dL 6.0   Total Bilirubin 0.3 - 1.2 mg/dL 0.6   Alkaline Phos 38 - 126 U/L 90   AST 15 - 41 U/L 15   ALT 0 - 44 U/L 17       Latest Ref Rng & Units 12/14/2022   11:13 AM  CBC  WBC 4.0 - 10.5 K/uL 3.9   Hemoglobin 12.0 - 15.0 g/dL 10.2   Hematocrit 72.5 - 46.0 % 40.6   Platelets 150 - 400 K/uL 197     No images are attached to the encounter.  No results found.   Assessment and plan-  Patient is a 77 y.o. 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 She is currently on revlimid and here for routine f/u  Cbc and cmp within normal limits. Free light chain ratio is normal and myeloma panel shows no M protein. She will continue with single agent revlimid 10 daily without breaks until progression or  toxicity. Continue low dose aspirin.  I will see her back in 3 months with labs   Visit Diagnosis 1. Multiple myeloma in remission (HCC)   2. High risk medication use      Dr. Owens Shark, MD, MPH [facility] at [facility] [phone] 12/20/2022 2:47 PM

## 2023-01-05 ENCOUNTER — Other Ambulatory Visit: Payer: Self-pay | Admitting: *Deleted

## 2023-01-05 DIAGNOSIS — C9001 Multiple myeloma in remission: Secondary | ICD-10-CM

## 2023-01-05 MED ORDER — LENALIDOMIDE 10 MG PO CAPS
10.0000 mg | ORAL_CAPSULE | Freq: Every day | ORAL | 0 refills | Status: AC
Start: 2023-01-05 — End: ?

## 2023-01-18 ENCOUNTER — Other Ambulatory Visit: Payer: Self-pay

## 2023-02-01 ENCOUNTER — Other Ambulatory Visit: Payer: Self-pay | Admitting: *Deleted

## 2023-02-01 DIAGNOSIS — C9001 Multiple myeloma in remission: Secondary | ICD-10-CM

## 2023-02-01 MED ORDER — LENALIDOMIDE 10 MG PO CAPS
10.0000 mg | ORAL_CAPSULE | Freq: Every day | ORAL | 0 refills | Status: DC
Start: 2023-02-01 — End: 2023-03-03

## 2023-03-03 ENCOUNTER — Other Ambulatory Visit: Payer: Self-pay | Admitting: *Deleted

## 2023-03-03 DIAGNOSIS — C9001 Multiple myeloma in remission: Secondary | ICD-10-CM

## 2023-03-04 ENCOUNTER — Encounter: Payer: Self-pay | Admitting: Hematology and Oncology

## 2023-03-04 ENCOUNTER — Encounter: Payer: Self-pay | Admitting: Oncology

## 2023-03-04 MED ORDER — LENALIDOMIDE 10 MG PO CAPS
10.0000 mg | ORAL_CAPSULE | Freq: Every day | ORAL | 0 refills | Status: DC
Start: 2023-03-04 — End: 2023-03-30

## 2023-03-16 ENCOUNTER — Other Ambulatory Visit: Payer: Medicare HMO

## 2023-03-16 ENCOUNTER — Ambulatory Visit: Payer: Medicare HMO | Admitting: Oncology

## 2023-03-17 ENCOUNTER — Inpatient Hospital Stay: Payer: Medicare HMO | Attending: Oncology

## 2023-03-17 ENCOUNTER — Encounter: Payer: Self-pay | Admitting: Oncology

## 2023-03-17 ENCOUNTER — Inpatient Hospital Stay (HOSPITAL_BASED_OUTPATIENT_CLINIC_OR_DEPARTMENT_OTHER): Payer: Medicare HMO | Admitting: Oncology

## 2023-03-17 VITALS — BP 132/61 | HR 80 | Temp 95.9°F | Resp 19 | Wt 157.4 lb

## 2023-03-17 DIAGNOSIS — C9001 Multiple myeloma in remission: Secondary | ICD-10-CM | POA: Diagnosis not present

## 2023-03-17 DIAGNOSIS — Z79899 Other long term (current) drug therapy: Secondary | ICD-10-CM

## 2023-03-17 DIAGNOSIS — Z7961 Long term (current) use of immunomodulator: Secondary | ICD-10-CM | POA: Diagnosis not present

## 2023-03-17 DIAGNOSIS — Z79624 Long term (current) use of inhibitors of nucleotide synthesis: Secondary | ICD-10-CM | POA: Insufficient documentation

## 2023-03-17 DIAGNOSIS — E538 Deficiency of other specified B group vitamins: Secondary | ICD-10-CM | POA: Insufficient documentation

## 2023-03-17 DIAGNOSIS — Z7982 Long term (current) use of aspirin: Secondary | ICD-10-CM | POA: Insufficient documentation

## 2023-03-17 LAB — COMPREHENSIVE METABOLIC PANEL
ALT: 20 U/L (ref 0–44)
AST: 15 U/L (ref 15–41)
Albumin: 3.7 g/dL (ref 3.5–5.0)
Alkaline Phosphatase: 92 U/L (ref 38–126)
Anion gap: 5 (ref 5–15)
BUN: 12 mg/dL (ref 8–23)
CO2: 29 mmol/L (ref 22–32)
Calcium: 8.4 mg/dL — ABNORMAL LOW (ref 8.9–10.3)
Chloride: 105 mmol/L (ref 98–111)
Creatinine, Ser: 0.93 mg/dL (ref 0.44–1.00)
GFR, Estimated: >60 mL/min (ref >60)
Glucose, Bld: 94 mg/dL (ref 70–99)
Potassium: 3.6 mmol/L (ref 3.5–5.1)
Sodium: 139 mmol/L (ref 135–145)
Total Bilirubin: 0.6 mg/dL (ref <1.2)
Total Protein: 5.8 g/dL — ABNORMAL LOW (ref 6.5–8.1)

## 2023-03-17 LAB — CBC WITH DIFFERENTIAL/PLATELET
Abs Immature Granulocytes: 0.04 10*3/uL (ref 0.00–0.07)
Basophils Absolute: 0.1 10*3/uL (ref 0.0–0.1)
Basophils Relative: 2 %
Eosinophils Absolute: 0.7 10*3/uL — ABNORMAL HIGH (ref 0.0–0.5)
Eosinophils Relative: 14 %
HCT: 37.4 % (ref 36.0–46.0)
Hemoglobin: 12.4 g/dL (ref 12.0–15.0)
Immature Granulocytes: 1 %
Lymphocytes Relative: 17 %
Lymphs Abs: 0.8 10*3/uL (ref 0.7–4.0)
MCH: 31 pg (ref 26.0–34.0)
MCHC: 33.2 g/dL (ref 30.0–36.0)
MCV: 93.5 fL (ref 80.0–100.0)
Monocytes Absolute: 0.4 10*3/uL (ref 0.1–1.0)
Monocytes Relative: 8 %
Neutro Abs: 2.7 10*3/uL (ref 1.7–7.7)
Neutrophils Relative %: 58 %
Platelets: 237 10*3/uL (ref 150–400)
RBC: 4 MIL/uL (ref 3.87–5.11)
RDW: 13.7 % (ref 11.5–15.5)
WBC: 4.6 10*3/uL (ref 4.0–10.5)
nRBC: 0 % (ref 0.0–0.2)

## 2023-03-17 MED ORDER — ONDANSETRON HCL 8 MG PO TABS: 8.0000 mg | ORAL_TABLET | Freq: Three times a day (TID) | ORAL | 2 refills | Status: DC | PRN

## 2023-03-17 NOTE — Progress Notes (Signed)
Hematology/Oncology Consult note [facility]  Telephone:([phone] Fax:([phone]  Patient Care Team: Hillery Aldo, MD as PCP - General (Family Medicine) Creig Hines, MD as Consulting Physician (Hematology and Oncology)   Name of the patient: Laurie [name]  [id number]  10/11/1945   Date of visit: 03/17/23  Diagnosis-history of multiple myeloma in remission  Chief complaint/ Reason for visit-routine follow-up of multiple myeloma on Revlimid  Heme/Onc history: Patient is a 77 year old female who was diagnosed with stage III biclonal IgA multiple myeloma in June 2021. SPEP was 5.0 gm/dL on 42/59/5638. Random urine revealed 196.6 mg/dL total protein with 75.6% M-spike.  Bone marrow biopsy on 11/13/2019 revealed a hypercellular bone marrow with extensive involvement by plasma cell neoplasm. Atypical plasma cells represented 70% of all cells in the aspirate and was associated with prominent interstitial infiltrates and diffuse sheets in the clot.  Plasma cells were kappa light chain restricted.   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   Bone survey on 10/26/2019 revealed small lytic lesions in the skull. There was no acute fracture.   Patient has been on daratumumab Revlimid dexamethasone regimen since July 2021.  She briefly received Retacrit in the past but has not required it for over 1 year now.  Starting May 2024 patient has been switched to Revlimid alone and Darzalex has been dropped given stable disease   Patient is a TEFL teacher Witness   Patient previously received Xgeva but that has been discontinued permanently due to osteonecrosis of the jaw which has required extensive  debridement and surgery by Dr. Mauri [name] at [facility].  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  She mostly eats mashed foods after her jaw reconstruction surgery as she is unable to chew.  ECOG PS- 1 Pain scale- 0   Review of systems- Review of Systems  Constitutional:  Positive for malaise/fatigue. Negative for chills, fever and weight loss.  HENT:  Negative for congestion, ear discharge and nosebleeds.   Eyes:  Negative for blurred vision.  Respiratory:  Negative for cough, hemoptysis, sputum production, shortness of breath and wheezing.   Cardiovascular:  Negative for chest pain, palpitations, orthopnea and claudication.  Gastrointestinal:  Negative for abdominal pain, blood in stool, constipation, diarrhea, heartburn, melena, nausea and vomiting.  Genitourinary:  Negative for dysuria, flank pain, frequency, hematuria and urgency.  Musculoskeletal:  Negative for back pain, joint pain and myalgias.  Skin:  Negative for rash.  Neurological:  Negative for dizziness, tingling, focal weakness, seizures, weakness and headaches.  Endo/Heme/Allergies:  Does not bruise/bleed easily.  Psychiatric/Behavioral:  Negative for depression and suicidal ideas. The patient does not have insomnia.       Allergies  Allergen Reactions   Levofloxacin     Critical      Past Medical History:  Diagnosis Date   Anxiety    Asthma    Cellulitis and abscess of oral soft tissues    Cellulitis and abscess of oral soft tissues 03/31/2021   pt in hospital and now out as outpt   Depression    Head injury    Multiple myeloma (HCC)  Osteoporosis    PONV (postoperative nausea and vomiting)      Past Surgical History:  Procedure Laterality Date   ABDOMINAL HYSTERECTOMY     CATARACT EXTRACTION W/PHACO Right 07/27/2022   Procedure: CATARACT EXTRACTION PHACO AND INTRAOCULAR LENS  PLACEMENT (IOC) RIGHT VISION BLUE;  Surgeon: Estanislado Pandy, MD;  Location: [facility];  Service: Ophthalmology;  Laterality: Right;  21.53 2:00.6   HEMORROIDECTOMY     MOUTH SURGERY     SEPTOPLASTY      Social History   Socioeconomic History   Marital status: Widowed    Spouse name: Not on file   Number of children: Not on file   Years of education: Not on file   Highest education level: Not on file  Occupational History   Not on file  Tobacco Use   Smoking status: Never   Smokeless tobacco: Never  Vaping Use   Vaping status: Never Used  Substance and Sexual Activity   Alcohol use: No   Drug use: No   Sexual activity: Not Currently  Other Topics Concern   Not on file  Social History Narrative   Jehovah Witness    Social Determinants of Health   Financial Resource Strain: Low Risk  (08/12/2022)   Received from [facility], [facility]   Overall Financial Resource Strain (CARDIA)    Difficulty of Paying Living Expenses: Not very hard  Food Insecurity: No Food Insecurity (08/12/2022)   Received from [facility], [facility]   Hunger Vital Sign    Worried About Running Out of Food in the Last Year: Never true    Ran Out of Food in the Last Year: Never true  Transportation Needs: No Transportation Needs (08/12/2022)   Received from [facility], [facility]   [facility] - Transportation    Lack of Transportation (Medical): No    Lack of Transportation (Non-Medical): No  Physical Activity: Not on file  Stress: Not on file  Social Connections: Not on file  Intimate Partner Violence: Not on file    Family History  Problem Relation Age of Onset   Prostate cancer Brother    Throat cancer Maternal Aunt    Breast cancer Cousin        mat cousin   Breast cancer Cousin    Thyroid cancer Other      Current Outpatient Medications:    cyanocobalamin (VITAMIN B12) 1000 MCG tablet, Take 1,000 mcg by mouth daily., Disp: , Rfl:     acetaminophen (TYLENOL) 325 MG tablet, Take 650 mg by mouth every 8 (eight) hours as needed for mild pain. (Patient not taking: Reported on 06/24/2022), Disp: , Rfl:    acyclovir (ZOVIRAX) 400 MG tablet, TAKE 1 TABLET BY MOUTH TWICE A DAY, Disp: 180 tablet, Rfl: 3   albuterol (VENTOLIN HFA) 108 (90 Base) MCG/ACT inhaler, 2 inhalations every 4 (four) hours as needed., Disp: , Rfl:    amoxicillin-clavulanate (AUGMENTIN) 875-125 MG tablet, Take 1 tablet by mouth 2 (two) times daily., Disp: , Rfl:    aspirin 81 MG chewable tablet, Chew by mouth daily., Disp: , Rfl:    atorvastatin (LIPITOR) 20 MG tablet, Take 20 mg by mouth daily., Disp: , Rfl:    busPIRone (BUSPAR) 10 MG tablet, Take 10 mg by mouth 2 (two) times daily., Disp: , Rfl:    Calcium Carb-Cholecalciferol (CALCIUM 600 + D PO), Take 600 mg by mouth 4 (four) times daily. Increase calcium from 600mg  4  times a day to 600mg  5 times a day starting today, Disp: , Rfl:    chlorhexidine (PERIDEX) 0.12 % solution, 15 mLs 2 (two) times daily., Disp: , Rfl:    dexamethasone (DECADRON) 4 MG tablet, Take 5 tablets (20 mg total) by mouth once a week., Disp: 120 tablet, Rfl: 2   docusate sodium (COLACE) 100 MG capsule, Take 100 mg by mouth 2 (two) times daily., Disp: , Rfl:    lenalidomide (REVLIMID) 10 MG capsule, Take 1 capsule (10 mg total) by mouth daily. [employer] Auth # [id number]     Date Obtained:03/04/2023, Disp: 28 capsule, Rfl: 0   montelukast (SINGULAIR) 10 MG tablet, Take 10 mg by mouth daily after breakfast., Disp: , Rfl:    nortriptyline (PAMELOR) 50 MG capsule, Take 50 mg by mouth at bedtime., Disp: , Rfl:    omeprazole (PRILOSEC) 20 MG capsule, Take 20 mg by mouth daily., Disp: , Rfl:    ondansetron (ZOFRAN) 8 MG tablet, Take 1 tablet (8 mg total) by mouth every 8 (eight) hours as needed., Disp: 20 tablet, Rfl: 2   pentoxifylline (TRENTAL) 400 MG CR tablet, Take 400 mg by mouth 2 (two) times daily., Disp: , Rfl:    sertraline (ZOLOFT) 100 MG  tablet, Take 100 mg by mouth 2 (two) times daily., Disp: , Rfl:    traZODone (DESYREL) 50 MG tablet, Take 50 mg by mouth at bedtime., Disp: , Rfl:    Vitamin D, Ergocalciferol, (DRISDOL) 50000 UNITS CAPS capsule, Take 50,000 Units by mouth every 30 (thirty) days. Once a month, Disp: , Rfl:    vitamin E 1000 UNIT capsule, Take 1,000 Units by mouth daily., Disp: , Rfl:   Physical exam:  Vitals:   03/17/23 1008  BP: 132/61  Pulse: 80  Resp: 19  Temp: (!) 95.9 F (35.5 C)  TempSrc: Tympanic  SpO2: 100%  Weight: 157 lb 6.4 oz (71.4 kg)   Physical Exam Cardiovascular:     Rate and Rhythm: Normal rate and regular rhythm.     Heart sounds: Normal heart sounds.  Pulmonary:     Effort: Pulmonary effort is normal.     Breath sounds: Normal breath sounds.  Skin:    General: Skin is warm and dry.  Neurological:     Mental Status: She is alert and oriented to person, place, and time.         Latest Ref Rng & Units 03/17/2023    9:41 AM  CMP  Glucose 70 - 99 mg/dL 94   BUN 8 - 23 mg/dL 12   Creatinine 4.03 - 1.00 mg/dL 4.74   Sodium 259 - 563 mmol/L 139   Potassium 3.5 - 5.1 mmol/L 3.6   Chloride 98 - 111 mmol/L 105   CO2 22 - 32 mmol/L 29   Calcium 8.9 - 10.3 mg/dL 8.4   Total Protein 6.5 - 8.1 g/dL 5.8   Total Bilirubin <8.7 mg/dL 0.6   Alkaline Phos 38 - 126 U/L 92   AST 15 - 41 U/L 15   ALT 0 - 44 U/L 20       Latest Ref Rng & Units 03/17/2023    9:41 AM  CBC  WBC 4.0 - 10.5 K/uL 4.6   Hemoglobin 12.0 - 15.0 g/dL 56.4   Hematocrit 33.2 - 46.0 % 37.4   Platelets 150 - 400 K/uL 237      Assessment and plan- Patient is a 77 y.o. female with stage III biclonal IgA kappa multiple  myeloma.  She is currently on Revlimid and this is a routine follow-up visit for multiple myeloma  Patient is currently on Revlimid 10 mg without any breaks.  Tolerating it well without significant side effects.  She is also on low-dose aspirin prophylaxis.  CBC CMP are within normal limits.   Myeloma panel and serum free light chains are pending from today.  Levels from 3 months ago showed no evidence of M protein on SPEP and free light chain ratio was normal.  I will see her back in 3 months with labs   Visit Diagnosis 1. Multiple myeloma in remission (HCC)   2. High risk medication use      Dr. Owens Shark, MD, MPH [facility] at [facility] [phone] 03/17/2023 1:56 PM

## 2023-03-18 ENCOUNTER — Other Ambulatory Visit: Payer: Self-pay

## 2023-03-18 LAB — KAPPA/LAMBDA LIGHT CHAINS
Kappa free light chain: 10.1 mg/L (ref 3.3–19.4)
Kappa, lambda light chain ratio: 1.36 (ref 0.26–1.65)
Lambda free light chains: 7.4 mg/L (ref 5.7–26.3)

## 2023-03-22 LAB — MULTIPLE MYELOMA PANEL, SERUM
Albumin SerPl Elph-Mcnc: 3.4 g/dL (ref 2.9–4.4)
Albumin/Glob SerPl: 1.9 — ABNORMAL HIGH (ref 0.7–1.7)
Alpha 1: 0.2 g/dL (ref 0.0–0.4)
Alpha2 Glob SerPl Elph-Mcnc: 0.5 g/dL (ref 0.4–1.0)
B-Globulin SerPl Elph-Mcnc: 0.7 g/dL (ref 0.7–1.3)
Gamma Glob SerPl Elph-Mcnc: 0.3 g/dL — ABNORMAL LOW (ref 0.4–1.8)
Globulin, Total: 1.8 g/dL — ABNORMAL LOW (ref 2.2–3.9)
IgA: 46 mg/dL — ABNORMAL LOW (ref 64–422)
IgG (Immunoglobin G), Serum: 362 mg/dL — ABNORMAL LOW (ref 586–1602)
IgM (Immunoglobulin M), Srm: 33 mg/dL (ref 26–217)
Total Protein ELP: 5.2 g/dL — ABNORMAL LOW (ref 6.0–8.5)

## 2023-03-30 ENCOUNTER — Other Ambulatory Visit: Payer: Self-pay | Admitting: *Deleted

## 2023-03-30 DIAGNOSIS — C9001 Multiple myeloma in remission: Secondary | ICD-10-CM

## 2023-03-30 MED ORDER — LENALIDOMIDE 10 MG PO CAPS: 10.0000 mg | ORAL_CAPSULE | Freq: Every day | ORAL | 0 refills | Status: DC

## 2023-04-12 ENCOUNTER — Other Ambulatory Visit: Payer: Self-pay

## 2023-04-30 ENCOUNTER — Other Ambulatory Visit: Payer: Self-pay | Admitting: *Deleted

## 2023-04-30 ENCOUNTER — Telehealth: Payer: Self-pay | Admitting: *Deleted

## 2023-04-30 DIAGNOSIS — C9001 Multiple myeloma in remission: Secondary | ICD-10-CM

## 2023-04-30 MED ORDER — LENALIDOMIDE 10 MG PO CAPS: 10.0000 mg | ORAL_CAPSULE | Freq: Every day | ORAL | 0 refills | Status: DC

## 2023-04-30 NOTE — Telephone Encounter (Signed)
Pharmacy needs refill order for Revlimid.

## 2023-05-06 ENCOUNTER — Encounter: Payer: Self-pay | Admitting: Hematology and Oncology

## 2023-05-06 ENCOUNTER — Encounter: Payer: Self-pay | Admitting: Oncology

## 2023-05-09 ENCOUNTER — Telehealth: Payer: Self-pay

## 2023-05-09 NOTE — Telephone Encounter (Signed)
 Oral Oncology Patient Advocate Encounter  Was successful in securing patient a $12,000.00 grant from [facility] to provide copayment coverage for Lenalidomide .  This will keep the out of pocket expense at $0.     [employer] ID: [id number]   The billing information is as follows and has been shared with [facility].    RxBin: [id number] PCN: [id number] Member ID: [id number] Group ID: [id number] Dates of Eligibility: 05/06/23 through 05/04/24  Fund:  Multiple Myeloma - Medicare Access   Morene Potters, CPhT Oncology Pharmacy Patient Advocate  [facility]  [phone] (phone) [phone] (fax)

## 2023-05-24 ENCOUNTER — Other Ambulatory Visit: Payer: Self-pay | Admitting: *Deleted

## 2023-05-24 DIAGNOSIS — C9001 Multiple myeloma in remission: Secondary | ICD-10-CM

## 2023-05-24 MED ORDER — LENALIDOMIDE 10 MG PO CAPS: 10.0000 mg | ORAL_CAPSULE | Freq: Every day | ORAL | 0 refills | Status: DC

## 2023-05-24 NOTE — Progress Notes (Signed)
sent revlimid for next dose

## 2023-06-02 ENCOUNTER — Other Ambulatory Visit (HOSPITAL_COMMUNITY): Payer: Self-pay

## 2023-06-02 ENCOUNTER — Telehealth: Payer: Self-pay | Admitting: Pharmacist

## 2023-06-02 DIAGNOSIS — C9001 Multiple myeloma in remission: Secondary | ICD-10-CM

## 2023-06-02 MED ORDER — LENALIDOMIDE 10 MG PO CAPS: 10.0000 mg | ORAL_CAPSULE | Freq: Every day | ORAL | 0 refills | Status: DC

## 2023-06-02 NOTE — Telephone Encounter (Signed)
Clinical Pharmacist Practitioner Encounter  Due to insurance restriction the medication could not be filled at [facility]. Prescription has been e-scribed to [facility].  Supportive information was faxed to [facility]. We will continue to follow medication access.   Called and notified patient, provided with new pharmacy number.  Remi Haggard, PharmD, BCPS, [facility] Hematology/Oncology Clinical Pharmacist [facility] [phone]  06/02/2023 11:25 AM

## 2023-06-02 NOTE — Telephone Encounter (Signed)
All supporting documents, including [employer] billing information, have been sent to [facility] for processing and fulfillment.    Ardeen Fillers, CPhT Oncology Pharmacy Patient Advocate  [facility]  [phone] (phone) [phone] (fax) 06/02/2023 12:22 PM

## 2023-06-14 ENCOUNTER — Telehealth: Payer: Self-pay | Admitting: *Deleted

## 2023-06-14 NOTE — Telephone Encounter (Signed)
 Patient called today and said that she cannot come to the appointment this week she does not have transportation because the people that are helping her one of them has the flu and the other 1 person is helping her family .  She would like to have an appointment next week if you could give her appointment as well as calling her with the date and time

## 2023-06-15 ENCOUNTER — Other Ambulatory Visit: Payer: Self-pay

## 2023-06-16 ENCOUNTER — Inpatient Hospital Stay: Payer: Medicare HMO | Admitting: Oncology

## 2023-06-16 ENCOUNTER — Inpatient Hospital Stay: Payer: Medicare HMO

## 2023-06-29 ENCOUNTER — Encounter: Payer: Self-pay | Admitting: Oncology

## 2023-06-29 ENCOUNTER — Inpatient Hospital Stay (HOSPITAL_BASED_OUTPATIENT_CLINIC_OR_DEPARTMENT_OTHER): Payer: Medicare HMO | Admitting: Oncology

## 2023-06-29 ENCOUNTER — Inpatient Hospital Stay: Payer: Medicare HMO | Attending: Oncology

## 2023-06-29 VITALS — BP 136/71 | HR 80 | Temp 96.4°F | Resp 16 | Wt 155.0 lb

## 2023-06-29 DIAGNOSIS — Z7982 Long term (current) use of aspirin: Secondary | ICD-10-CM | POA: Diagnosis not present

## 2023-06-29 DIAGNOSIS — Z79899 Other long term (current) drug therapy: Secondary | ICD-10-CM | POA: Diagnosis not present

## 2023-06-29 DIAGNOSIS — Z7961 Long term (current) use of immunomodulator: Secondary | ICD-10-CM | POA: Insufficient documentation

## 2023-06-29 DIAGNOSIS — Z79624 Long term (current) use of inhibitors of nucleotide synthesis: Secondary | ICD-10-CM | POA: Diagnosis not present

## 2023-06-29 DIAGNOSIS — Z5111 Encounter for antineoplastic chemotherapy: Secondary | ICD-10-CM

## 2023-06-29 DIAGNOSIS — C9001 Multiple myeloma in remission: Secondary | ICD-10-CM | POA: Insufficient documentation

## 2023-06-29 DIAGNOSIS — C9000 Multiple myeloma not having achieved remission: Secondary | ICD-10-CM

## 2023-06-29 LAB — COMPREHENSIVE METABOLIC PANEL
ALT: 27 U/L (ref 0–44)
AST: 22 U/L (ref 15–41)
Albumin: 4.1 g/dL (ref 3.5–5.0)
Alkaline Phosphatase: 110 U/L (ref 38–126)
Anion gap: 9 (ref 5–15)
BUN: 17 mg/dL (ref 8–23)
CO2: 26 mmol/L (ref 22–32)
Calcium: 8.5 mg/dL — ABNORMAL LOW (ref 8.9–10.3)
Chloride: 103 mmol/L (ref 98–111)
Creatinine, Ser: 1.09 mg/dL — ABNORMAL HIGH (ref 0.44–1.00)
GFR, Estimated: 53 mL/min — ABNORMAL LOW (ref >60)
Glucose, Bld: 93 mg/dL (ref 70–99)
Potassium: 3.5 mmol/L (ref 3.5–5.1)
Sodium: 138 mmol/L (ref 135–145)
Total Bilirubin: 0.6 mg/dL (ref 0.0–1.2)
Total Protein: 6.8 g/dL (ref 6.5–8.1)

## 2023-06-29 LAB — CBC WITH DIFFERENTIAL/PLATELET
Abs Immature Granulocytes: 0.04 10*3/uL (ref 0.00–0.07)
Basophils Absolute: 0 10*3/uL (ref 0.0–0.1)
Basophils Relative: 1 %
Eosinophils Absolute: 0.3 10*3/uL (ref 0.0–0.5)
Eosinophils Relative: 8 %
HCT: 41.9 % (ref 36.0–46.0)
Hemoglobin: 13.8 g/dL (ref 12.0–15.0)
Immature Granulocytes: 1 %
Lymphocytes Relative: 18 %
Lymphs Abs: 0.7 10*3/uL (ref 0.7–4.0)
MCH: 31.6 pg (ref 26.0–34.0)
MCHC: 32.9 g/dL (ref 30.0–36.0)
MCV: 95.9 fL (ref 80.0–100.0)
Monocytes Absolute: 0.5 10*3/uL (ref 0.1–1.0)
Monocytes Relative: 12 %
Neutro Abs: 2.3 10*3/uL (ref 1.7–7.7)
Neutrophils Relative %: 60 %
Platelets: 136 10*3/uL — ABNORMAL LOW (ref 150–400)
RBC: 4.37 MIL/uL (ref 3.87–5.11)
RDW: 13.6 % (ref 11.5–15.5)
WBC: 3.9 10*3/uL — ABNORMAL LOW (ref 4.0–10.5)
nRBC: 0 % (ref 0.0–0.2)

## 2023-06-29 NOTE — Progress Notes (Signed)
Pt in for follow up, denies any concerns today. 

## 2023-06-30 ENCOUNTER — Other Ambulatory Visit: Payer: Self-pay

## 2023-06-30 LAB — KAPPA/LAMBDA LIGHT CHAINS
Kappa free light chain: 15.3 mg/L (ref 3.3–19.4)
Kappa, lambda light chain ratio: 0.98 (ref 0.26–1.65)
Lambda free light chains: 15.6 mg/L (ref 5.7–26.3)

## 2023-07-01 ENCOUNTER — Other Ambulatory Visit: Payer: Self-pay | Admitting: Oncology

## 2023-07-01 ENCOUNTER — Other Ambulatory Visit: Payer: Self-pay

## 2023-07-01 ENCOUNTER — Other Ambulatory Visit: Payer: Self-pay | Admitting: *Deleted

## 2023-07-01 DIAGNOSIS — C9001 Multiple myeloma in remission: Secondary | ICD-10-CM

## 2023-07-01 NOTE — Progress Notes (Unsigned)
 error

## 2023-07-02 LAB — MULTIPLE MYELOMA PANEL, SERUM
Albumin SerPl Elph-Mcnc: 3.5 g/dL (ref 2.9–4.4)
Albumin/Glob SerPl: 1.6 (ref 0.7–1.7)
Alpha 1: 0.2 g/dL (ref 0.0–0.4)
Alpha2 Glob SerPl Elph-Mcnc: 0.6 g/dL (ref 0.4–1.0)
B-Globulin SerPl Elph-Mcnc: 0.8 g/dL (ref 0.7–1.3)
Gamma Glob SerPl Elph-Mcnc: 0.5 g/dL (ref 0.4–1.8)
Globulin, Total: 2.2 g/dL (ref 2.2–3.9)
IgA: 164 mg/dL (ref 64–422)
IgG (Immunoglobin G), Serum: 533 mg/dL — ABNORMAL LOW (ref 586–1602)
IgM (Immunoglobulin M), Srm: 28 mg/dL (ref 26–217)
Total Protein ELP: 5.7 g/dL — ABNORMAL LOW (ref 6.0–8.5)

## 2023-07-03 NOTE — Progress Notes (Unsigned)
 Hematology/Oncology Consult note [address] [facility]  [phone] [phone]  Patient Care Team: Hillery Aldo, MD as PCP - General (Family Medicine) Creig Hines, MD as Consulting Physician (Hematology and Oncology)   Name of the patient: Laurie [name]  [id number]  1946-01-17   Date of visit: 07/03/23  Diagnosis- stage IIIA IgA multiple myeloma in remission  Chief complaint/ Reason for visit- routine f/u of multiple myeloma in remission  Heme/Onc history: Patient is a 78 year old female who was diagnosed with stage III biclonal IgA multiple myeloma in June 2021. SPEP was 5.0 gm/dL on 57/84/6962. Random urine revealed 196.6 mg/dL total protein with 95.2% M-spike.  Bone marrow biopsy on 11/13/2019 revealed a hypercellular bone marrow with extensive involvement by plasma cell neoplasm. Atypical plasma cells represented 70% of all cells in the aspirate and was associated with prominent interstitial infiltrates and diffuse sheets in the clot.  Plasma cells were kappa light chain restricted.   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   Bone survey on 10/26/2019 revealed small lytic lesions in the skull. There was no acute fracture.   Patient has been on daratumumab Revlimid dexamethasone regimen since July 2021.  She briefly received Retacrit in the past but has not required it for over 1 year now.  Starting May 2024 patient has been switched to Revlimid alone and Darzalex has been dropped given stable disease   Patient is a TEFL teacher Witness  Interval history-patient is doing well presently.  Tolerating Revlimid well without any significant side effects.  She only eats soft food due  to her jaw reconstruction surgery for ONJ.   ECOG PS- 1 Pain scale- 0   Review of systems- Review of Systems  Constitutional:  Negative for chills, fever, malaise/fatigue and weight loss.  HENT:  Negative for congestion, ear discharge and nosebleeds.   Eyes:  Negative for blurred vision.  Respiratory:  Negative for cough, hemoptysis, sputum production, shortness of breath and wheezing.   Cardiovascular:  Negative for chest pain, palpitations, orthopnea and claudication.  Gastrointestinal:  Negative for abdominal pain, blood in stool, constipation, diarrhea, heartburn, melena, nausea and vomiting.  Genitourinary:  Negative for dysuria, flank pain, frequency, hematuria and urgency.  Musculoskeletal:  Negative for back pain, joint pain and myalgias.  Skin:  Negative for rash.  Neurological:  Negative for dizziness, tingling, focal weakness, seizures, weakness and headaches.  Endo/Heme/Allergies:  Does not bruise/bleed easily.  Psychiatric/Behavioral:  Negative for depression and suicidal ideas. The patient does not have insomnia.       Allergies  Allergen Reactions   Levofloxacin     Critical      Past Medical History:  Diagnosis Date   Anxiety    Asthma    Cellulitis and abscess of oral soft tissues    Cellulitis and abscess of oral soft tissues 03/31/2021   pt in hospital and now out as outpt   Depression    Head injury    Multiple myeloma (HCC)    Osteoporosis    PONV (postoperative nausea and vomiting)      Past Surgical History:  Procedure Laterality Date   ABDOMINAL HYSTERECTOMY     CATARACT EXTRACTION W/PHACO Right 07/27/2022   Procedure: CATARACT EXTRACTION PHACO AND INTRAOCULAR LENS  PLACEMENT (IOC) RIGHT VISION BLUE;  Surgeon: Estanislado Pandy, MD;  Location: [facility];  Service: Ophthalmology;  Laterality: Right;  21.53 2:00.6   HEMORROIDECTOMY     MOUTH SURGERY     SEPTOPLASTY      Social History   Socioeconomic History   Marital  status: Widowed    Spouse name: Not on file   Number of children: Not on file   Years of education: Not on file   Highest education level: Not on file  Occupational History   Not on file  Tobacco Use   Smoking status: Never   Smokeless tobacco: Never  Vaping Use   Vaping status: Never Used  Substance and Sexual Activity   Alcohol use: No   Drug use: No   Sexual activity: Not Currently  Other Topics Concern   Not on file  Social History Narrative   Jehovah Witness    Social Drivers of Health   Financial Resource Strain: Low Risk  (08/12/2022)   Received from [facility], [facility]   Overall Financial Resource Strain (CARDIA)    Difficulty of Paying Living Expenses: Not very hard  Food Insecurity: No Food Insecurity (08/12/2022)   Received from [facility], [facility]   Hunger Vital Sign    Worried About Running Out of Food in the Last Year: Never true    Ran Out of Food in the Last Year: Never true  Transportation Needs: No Transportation Needs (08/12/2022)   Received from [facility], [facility]   [facility] - Transportation    Lack of Transportation (Medical): No    Lack of Transportation (Non-Medical): No  Physical Activity: Not on file  Stress: Not on file  Social Connections: Not on file  Intimate Partner Violence: Not on file    Family History  Problem Relation Age of Onset   Prostate cancer Brother    Throat cancer Maternal Aunt    Breast cancer Cousin        mat cousin   Breast cancer Cousin    Thyroid cancer Other      Current Outpatient Medications:    acetaminophen (TYLENOL) 325 MG tablet, Take 650 mg by mouth every 8 (eight) hours as needed for mild pain (pain score 1-3)., Disp: , Rfl:    acyclovir (ZOVIRAX) 400 MG tablet, TAKE 1 TABLET BY MOUTH TWICE A DAY, Disp: 180 tablet, Rfl: 3   albuterol (VENTOLIN HFA) 108 (90 Base) MCG/ACT inhaler, 2 inhalations every 4 (four) hours as needed., Disp: , Rfl:    aspirin 81 MG chewable  tablet, Chew by mouth daily., Disp: , Rfl:    atorvastatin (LIPITOR) 20 MG tablet, Take 20 mg by mouth daily., Disp: , Rfl:    busPIRone (BUSPAR) 10 MG tablet, Take 10 mg by mouth 2 (two) times daily., Disp: , Rfl:    Calcium Carb-Cholecalciferol (CALCIUM 600 + D PO), Take 600 mg by mouth 4 (four) times daily. Increase calcium from 600mg  4 times a day to 600mg  5 times a day starting today, Disp: , Rfl:    chlorhexidine (PERIDEX) 0.12 % solution, 15 mLs 2 (two) times daily., Disp: , Rfl:    cyanocobalamin (VITAMIN B12) 1000 MCG tablet, Take 1,000 mcg by mouth daily., Disp: , Rfl:    dexamethasone (DECADRON) 4 MG tablet, Take 5 tablets (20 mg total) by mouth once a week., Disp: 120 tablet, Rfl: 2   docusate sodium (COLACE) 100 MG capsule, Take 100 mg  by mouth 2 (two) times daily., Disp: , Rfl:    montelukast (SINGULAIR) 10 MG tablet, Take 10 mg by mouth daily after breakfast., Disp: , Rfl:    nortriptyline (PAMELOR) 50 MG capsule, Take 50 mg by mouth at bedtime., Disp: , Rfl:    omeprazole (PRILOSEC) 20 MG capsule, Take 20 mg by mouth daily., Disp: , Rfl:    ondansetron (ZOFRAN) 8 MG tablet, Take 1 tablet (8 mg total) by mouth every 8 (eight) hours as needed., Disp: 20 tablet, Rfl: 2   pentoxifylline (TRENTAL) 400 MG CR tablet, Take 400 mg by mouth 2 (two) times daily., Disp: , Rfl:    sertraline (ZOLOFT) 100 MG tablet, Take 100 mg by mouth 2 (two) times daily., Disp: , Rfl:    traZODone (DESYREL) 50 MG tablet, Take 50 mg by mouth at bedtime., Disp: , Rfl:    Vitamin D, Ergocalciferol, (DRISDOL) 50000 UNITS CAPS capsule, Take 50,000 Units by mouth every 30 (thirty) days. Once a month, Disp: , Rfl:    vitamin E 1000 UNIT capsule, Take 1,000 Units by mouth daily., Disp: , Rfl:    lenalidomide (REVLIMID) 10 MG capsule, TAKE 1 CAPSULE BY MOUTH 1 TIME A DAY, Disp: 28 capsule, Rfl: 0  Physical exam:  Vitals:   06/29/23 1117  BP: 136/71  Pulse: 80  Resp: 16  Temp: (!) 96.4 F (35.8 C)  TempSrc:  Tympanic  SpO2: 97%  Weight: 155 lb (70.3 kg)   Physical Exam Cardiovascular:     Rate and Rhythm: Normal rate and regular rhythm.     Heart sounds: Normal heart sounds.  Pulmonary:     Effort: Pulmonary effort is normal.     Breath sounds: Normal breath sounds.  Abdominal:     General: Bowel sounds are normal.     Palpations: Abdomen is soft.  Skin:    General: Skin is warm and dry.  Neurological:     Mental Status: She is alert and oriented to person, place, and time.         Latest Ref Rng & Units 06/29/2023   10:50 AM  CMP  Glucose 70 - 99 mg/dL 93   BUN 8 - 23 mg/dL 17   Creatinine 7.25 - 1.00 mg/dL 3.66   Sodium 440 - 347 mmol/L 138   Potassium 3.5 - 5.1 mmol/L 3.5   Chloride 98 - 111 mmol/L 103   CO2 22 - 32 mmol/L 26   Calcium 8.9 - 10.3 mg/dL 8.5   Total Protein 6.5 - 8.1 g/dL 6.8   Total Bilirubin 0.0 - 1.2 mg/dL 0.6   Alkaline Phos 38 - 126 U/L 110   AST 15 - 41 U/L 22   ALT 0 - 44 U/L 27       Latest Ref Rng & Units 06/29/2023   10:50 AM  CBC  WBC 4.0 - 10.5 K/uL 3.9   Hemoglobin 12.0 - 15.0 g/dL 42.5   Hematocrit 95.6 - 46.0 % 41.9   Platelets 150 - 400 K/uL 136      Assessment and plan- Patient is a 78 y.o. 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  She is here for a routine follow-up of multiple myeloma on Revlimid  Tolerating Revlimid at 10 mg daily without any breaks well without any significant side effects.  CBC shows normal H&H of 13.8/41.9.  CMP is within normal limits with normal creatinine and calcium.  Still no evidence of worsening renal failure or hypercalcemia or  anemia.  Myeloma panel shows no M protein and serum free light chain ratio remains normal.  Her myeloma is therefore in remission.  I will see her back in 3 months with CBC with differential CMP myeloma panel and serum free light chain continue aspirin prophylaxis as well   Visit Diagnosis 1. Multiple myeloma in remission (HCC)   2. High risk medication use       Dr. Owens Shark, MD, MPH [facility] at [facility] [phone] 07/03/2023 10:26 AM

## 2023-07-04 ENCOUNTER — Encounter: Payer: Self-pay | Admitting: Oncology

## 2023-07-04 ENCOUNTER — Encounter: Payer: Self-pay | Admitting: Hematology and Oncology

## 2023-07-23 ENCOUNTER — Other Ambulatory Visit: Payer: Self-pay | Admitting: Oncology

## 2023-07-23 DIAGNOSIS — C9001 Multiple myeloma in remission: Secondary | ICD-10-CM

## 2023-08-19 ENCOUNTER — Other Ambulatory Visit: Payer: Self-pay

## 2023-08-19 ENCOUNTER — Other Ambulatory Visit: Payer: Self-pay | Admitting: Oncology

## 2023-08-19 DIAGNOSIS — C9001 Multiple myeloma in remission: Secondary | ICD-10-CM

## 2023-08-19 MED ORDER — LENALIDOMIDE 10 MG PO CAPS: 10.0000 mg | ORAL_CAPSULE | Freq: Every day | ORAL | 0 refills | Status: DC

## 2023-08-19 MED ORDER — LENALIDOMIDE 10 MG PO CAPS
10.0000 mg | ORAL_CAPSULE | Freq: Every day | ORAL | 0 refills | Status: DC
Start: 2023-08-19 — End: 2023-09-10

## 2023-08-20 ENCOUNTER — Encounter: Payer: Self-pay | Admitting: Hematology and Oncology

## 2023-08-20 ENCOUNTER — Encounter: Payer: Self-pay | Admitting: Oncology

## 2023-09-09 ENCOUNTER — Other Ambulatory Visit: Payer: Self-pay | Admitting: Oncology

## 2023-09-09 DIAGNOSIS — C9001 Multiple myeloma in remission: Secondary | ICD-10-CM

## 2023-09-10 ENCOUNTER — Encounter: Payer: Self-pay | Admitting: Hematology and Oncology

## 2023-09-10 ENCOUNTER — Encounter: Payer: Self-pay | Admitting: Oncology

## 2023-09-23 ENCOUNTER — Other Ambulatory Visit: Payer: Self-pay | Admitting: Oncology

## 2023-09-23 DIAGNOSIS — C9000 Multiple myeloma not having achieved remission: Secondary | ICD-10-CM

## 2023-10-01 ENCOUNTER — Other Ambulatory Visit: Payer: Self-pay

## 2023-10-01 DIAGNOSIS — C9001 Multiple myeloma in remission: Secondary | ICD-10-CM

## 2023-10-04 ENCOUNTER — Encounter: Payer: Self-pay | Admitting: Oncology

## 2023-10-04 ENCOUNTER — Inpatient Hospital Stay: Payer: Medicare HMO | Attending: Oncology

## 2023-10-04 ENCOUNTER — Inpatient Hospital Stay: Payer: Medicare HMO | Admitting: Oncology

## 2023-10-04 VITALS — BP 121/97 | HR 88 | Temp 98.6°F | Resp 18 | Ht 63.0 in | Wt 145.5 lb

## 2023-10-04 DIAGNOSIS — Z79624 Long term (current) use of inhibitors of nucleotide synthesis: Secondary | ICD-10-CM | POA: Insufficient documentation

## 2023-10-04 DIAGNOSIS — T50905A Adverse effect of unspecified drugs, medicaments and biological substances, initial encounter: Secondary | ICD-10-CM | POA: Diagnosis not present

## 2023-10-04 DIAGNOSIS — Z7982 Long term (current) use of aspirin: Secondary | ICD-10-CM | POA: Diagnosis not present

## 2023-10-04 DIAGNOSIS — R112 Nausea with vomiting, unspecified: Secondary | ICD-10-CM | POA: Diagnosis not present

## 2023-10-04 DIAGNOSIS — C9001 Multiple myeloma in remission: Secondary | ICD-10-CM | POA: Insufficient documentation

## 2023-10-04 DIAGNOSIS — Z7961 Long term (current) use of immunomodulator: Secondary | ICD-10-CM | POA: Diagnosis not present

## 2023-10-04 DIAGNOSIS — Z79899 Other long term (current) drug therapy: Secondary | ICD-10-CM | POA: Insufficient documentation

## 2023-10-04 LAB — CBC WITH DIFFERENTIAL (CANCER CENTER ONLY)
Abs Immature Granulocytes: 0.02 10*3/uL (ref 0.00–0.07)
Basophils Absolute: 0.1 10*3/uL (ref 0.0–0.1)
Basophils Relative: 2 %
Eosinophils Absolute: 0.2 10*3/uL (ref 0.0–0.5)
Eosinophils Relative: 7 %
HCT: 36.6 % (ref 36.0–46.0)
Hemoglobin: 12.1 g/dL (ref 12.0–15.0)
Immature Granulocytes: 1 %
Lymphocytes Relative: 21 %
Lymphs Abs: 0.8 10*3/uL (ref 0.7–4.0)
MCH: 31.3 pg (ref 26.0–34.0)
MCHC: 33.1 g/dL (ref 30.0–36.0)
MCV: 94.8 fL (ref 80.0–100.0)
Monocytes Absolute: 0.4 10*3/uL (ref 0.1–1.0)
Monocytes Relative: 10 %
Neutro Abs: 2.2 10*3/uL (ref 1.7–7.7)
Neutrophils Relative %: 59 %
Platelet Count: 140 10*3/uL — ABNORMAL LOW (ref 150–400)
RBC: 3.86 MIL/uL — ABNORMAL LOW (ref 3.87–5.11)
RDW: 14 % (ref 11.5–15.5)
WBC Count: 3.6 10*3/uL — ABNORMAL LOW (ref 4.0–10.5)
nRBC: 0 % (ref 0.0–0.2)

## 2023-10-04 LAB — CMP (CANCER CENTER ONLY)
ALT: 16 U/L (ref 0–44)
AST: 17 U/L (ref 15–41)
Albumin: 3.6 g/dL (ref 3.5–5.0)
Alkaline Phosphatase: 75 U/L (ref 38–126)
Anion gap: 9 (ref 5–15)
BUN: 12 mg/dL (ref 8–23)
CO2: 27 mmol/L (ref 22–32)
Calcium: 8.2 mg/dL — ABNORMAL LOW (ref 8.9–10.3)
Chloride: 103 mmol/L (ref 98–111)
Creatinine: 0.9 mg/dL (ref 0.44–1.00)
GFR, Estimated: >60 mL/min (ref >60)
Glucose, Bld: 96 mg/dL (ref 70–99)
Potassium: 3.5 mmol/L (ref 3.5–5.1)
Sodium: 139 mmol/L (ref 135–145)
Total Bilirubin: 0.6 mg/dL (ref 0.0–1.2)
Total Protein: 6 g/dL — ABNORMAL LOW (ref 6.5–8.1)

## 2023-10-04 MED ORDER — ONDANSETRON HCL 8 MG PO TABS: 8.0000 mg | ORAL_TABLET | Freq: Three times a day (TID) | ORAL | 2 refills | Status: DC | PRN

## 2023-10-04 MED ORDER — ONDANSETRON HCL 8 MG PO TABS: 8.0000 mg | ORAL_TABLET | Freq: Three times a day (TID) | ORAL | 3 refills | Status: AC | PRN

## 2023-10-04 NOTE — Progress Notes (Signed)
 Hematology/Oncology Consult note [facility]  [phone] [phone]  Patient Care Team: Comer Decamp, MD as PCP - General (Family Medicine) Avonne Boettcher, MD as Consulting Physician (Hematology and Oncology)   Name of the patient: Laurie [name]  [id number]  1945-08-16   Date of visit: 10/04/23  Diagnosis- stage IIIA IgA multiple myeloma in remission   Chief complaint/ Reason for visit-routine follow-up of multiple myeloma presently on Revlimid  and in remission  Heme/Onc history:  Patient is a 78 year old female who was diagnosed with stage III biclonal IgA multiple myeloma in June 2021. SPEP was 5.0 gm/dL on 62/13/0865. Random urine revealed 196.6 mg/dL total protein with 78.4% M-spike.  Bone marrow biopsy on 11/13/2019 revealed a hypercellular bone marrow with extensive involvement by plasma cell neoplasm. Atypical plasma cells represented 70% of all cells in the aspirate and was associated with prominent interstitial infiltrates and diffuse sheets in the clot.  Plasma cells were kappa light chain restricted.   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   Bone survey on 10/26/2019 revealed small lytic lesions in the skull. There was no acute fracture.   Patient has been on daratumumab  Revlimid  dexamethasone  regimen since July 2021.  She briefly received Retacrit  in the past but has not required it for over 1 year now.  Starting May 2024 patient has been switched to Revlimid  alone and Darzalex  has been dropped given stable disease   Patient is a TEFL teacher Witness    Interval history-patient has been compliant with her Revlimid  and is tolerating it well without any  significant side effects other than occasional nausea for which she uses 1 dose of Zofran  daily.  She does report some increased fatigue over the last week to 10 days.  ECOG PS- 1 Pain scale- 0   Review of systems- Review of Systems  Constitutional:  Positive for malaise/fatigue. Negative for chills, fever and weight loss.  HENT:  Negative for congestion, ear discharge and nosebleeds.   Eyes:  Negative for blurred vision.  Respiratory:  Negative for cough, hemoptysis, sputum production, shortness of breath and wheezing.   Cardiovascular:  Negative for chest pain, palpitations, orthopnea and claudication.  Gastrointestinal:  Positive for nausea. Negative for abdominal pain, blood in stool, constipation, diarrhea, heartburn, melena and vomiting.  Genitourinary:  Negative for dysuria, flank pain, frequency, hematuria and urgency.  Musculoskeletal:  Negative for back pain, joint pain and myalgias.  Skin:  Negative for rash.  Neurological:  Negative for dizziness, tingling, focal weakness, seizures, weakness and headaches.  Endo/Heme/Allergies:  Does not bruise/bleed easily.  Psychiatric/Behavioral:  Negative for depression and suicidal ideas. The patient does not have insomnia.       Allergies  Allergen Reactions   Levofloxacin     Critical      Past Medical History:  Diagnosis Date   Anxiety    Asthma    Cellulitis and abscess of oral soft tissues    Cellulitis and abscess of oral soft tissues 03/31/2021   pt in hospital and now out as outpt   Depression    Head injury    Multiple myeloma (HCC)    Osteoporosis    PONV (postoperative nausea and vomiting)      Past Surgical History:  Procedure Laterality Date   ABDOMINAL HYSTERECTOMY     CATARACT EXTRACTION W/PHACO Right 07/27/2022   Procedure: CATARACT EXTRACTION PHACO AND INTRAOCULAR LENS PLACEMENT (IOC) RIGHT VISION BLUE;  Surgeon: Trudi Fus, MD;  Location: [facility];  Service: Ophthalmology;   Laterality: Right;  21.53 2:00.6   HEMORROIDECTOMY     MOUTH SURGERY     SEPTOPLASTY      Social History   Socioeconomic History   Marital status: Widowed    Spouse name: Not on file   Number of children: Not on file   Years of education: Not on file   Highest education level: Not on file  Occupational History   Not on file  Tobacco Use   Smoking status: Never   Smokeless tobacco: Never  Vaping Use   Vaping status: Never Used  Substance and Sexual Activity   Alcohol use: No   Drug use: No   Sexual activity: Not Currently  Other Topics Concern   Not on file  Social History Narrative   Jehovah Witness    Social Drivers of Health   Financial Resource Strain: Low Risk  (08/12/2022)   Received from [facility], [facility]   Overall Financial Resource Strain (CARDIA)    Difficulty of Paying Living Expenses: Not very hard  Food Insecurity: No Food Insecurity (08/12/2022)   Received from [facility], [facility]   Hunger Vital Sign    Worried About Running Out of Food in the Last Year: Never true    Ran Out of Food in the Last Year: Never true  Transportation Needs: No Transportation Needs (08/12/2022)   Received from [facility], [facility]   [facility] - Transportation    Lack of Transportation (Medical): No    Lack of Transportation (Non-Medical): No  Physical Activity: Not on file  Stress: Not on file  Social Connections: Not on file  Intimate Partner Violence: Not on file    Family History  Problem Relation Age of Onset   Prostate cancer Brother    Throat cancer Maternal Aunt    Breast cancer Cousin        mat cousin   Breast cancer Cousin    Thyroid  cancer Other      Current Outpatient Medications:    acetaminophen  (TYLENOL ) 325 MG tablet, Take 650 mg by mouth every 8 (eight) hours as needed for mild pain (pain score 1-3)., Disp: , Rfl:    acyclovir  (ZOVIRAX ) 400 MG tablet, TAKE 1 TABLET BY MOUTH TWICE A DAY, Disp: 180 tablet, Rfl:  3   albuterol  (VENTOLIN  HFA) 108 (90 Base) MCG/ACT inhaler, 2 inhalations every 4 (four) hours as needed., Disp: , Rfl:    aspirin 81 MG chewable tablet, Chew by mouth daily., Disp: , Rfl:    atorvastatin  (LIPITOR) 20 MG tablet, Take 20 mg by mouth daily., Disp: , Rfl:    busPIRone  (BUSPAR ) 10 MG tablet, Take 10 mg by mouth 2 (two) times daily., Disp: , Rfl:    Calcium  Carb-Cholecalciferol (CALCIUM  600 + D PO), Take 600 mg by mouth 4 (four) times daily. Increase calcium  from 600mg  4 times a day to 600mg  5 times a day starting today, Disp: , Rfl:    chlorhexidine (PERIDEX) 0.12 % solution, 15 mLs 2 (two) times daily., Disp: , Rfl:    cyanocobalamin  (VITAMIN B12) 1000 MCG tablet, Take 1,000 mcg by mouth daily., Disp: , Rfl:    dexamethasone  (DECADRON ) 4 MG tablet, Take 5  tablets (20 mg total) by mouth once a week., Disp: 120 tablet, Rfl: 2   docusate sodium (COLACE) 100 MG capsule, Take 100 mg by mouth 2 (two) times daily., Disp: , Rfl:    lenalidomide  (REVLIMID ) 10 MG capsule, TAKE 1 CAPSULE BY MOUTH 1 TIME A DAY, Disp: 28 capsule, Rfl: 0   lenalidomide  (REVLIMID ) 10 MG capsule, TAKE 1 CAPSULE BY MOUTH 1 TIME A DAY FOR 28 DAYS, Disp: 28 capsule, Rfl: 0   montelukast  (SINGULAIR ) 10 MG tablet, Take 10 mg by mouth daily after breakfast., Disp: , Rfl:    nortriptyline  (PAMELOR ) 50 MG capsule, Take 50 mg by mouth at bedtime., Disp: , Rfl:    omeprazole  (PRILOSEC) 20 MG capsule, Take 20 mg by mouth daily., Disp: , Rfl:    pentoxifylline (TRENTAL) 400 MG CR tablet, Take 400 mg by mouth 2 (two) times daily., Disp: , Rfl:    sertraline  (ZOLOFT ) 100 MG tablet, Take 100 mg by mouth 2 (two) times daily., Disp: , Rfl:    traZODone  (DESYREL ) 50 MG tablet, Take 50 mg by mouth at bedtime., Disp: , Rfl:    Vitamin D, Ergocalciferol, (DRISDOL) 50000 UNITS CAPS capsule, Take 50,000 Units by mouth every 30 (thirty) days. Once a month, Disp: , Rfl:    vitamin E 1000 UNIT capsule, Take 1,000 Units by mouth daily., Disp:  , Rfl:    ondansetron  (ZOFRAN ) 8 MG tablet, Take 1 tablet (8 mg total) by mouth every 8 (eight) hours as needed., Disp: 60 tablet, Rfl: 3  Physical exam:  Vitals:   10/04/23 1128  BP: (!) 121/97  Pulse: 88  Resp: 18  Temp: 98.6 F (37 C)  TempSrc: Tympanic  SpO2: 100%  Weight: 145 lb 8 oz (66 kg)  Height: 5\' 3"  (1.6 m)   Physical Exam Constitutional:      Comments: Ambulates with a wheelchair.  Appears in no acute distress  Cardiovascular:     Rate and Rhythm: Normal rate and regular rhythm.     Heart sounds: Normal heart sounds.  Pulmonary:     Effort: Pulmonary effort is normal.     Breath sounds: Normal breath sounds.  Skin:    General: Skin is warm and dry.  Neurological:     Mental Status: She is alert and oriented to person, place, and time.      I have personally reviewed labs listed below:    Latest Ref Rng & Units 10/04/2023   11:08 AM  CMP  Glucose 70 - 99 mg/dL 96   BUN 8 - 23 mg/dL 12   Creatinine 0.10 - 1.00 mg/dL 2.72   Sodium 536 - 644 mmol/L 139   Potassium 3.5 - 5.1 mmol/L 3.5   Chloride 98 - 111 mmol/L 103   CO2 22 - 32 mmol/L 27   Calcium  8.9 - 10.3 mg/dL 8.2   Total Protein 6.5 - 8.1 g/dL 6.0   Total Bilirubin 0.0 - 1.2 mg/dL 0.6   Alkaline Phos 38 - 126 U/L 75   AST 15 - 41 U/L 17   ALT 0 - 44 U/L 16       Latest Ref Rng & Units 10/04/2023   11:07 AM  CBC  WBC 4.0 - 10.5 K/uL 3.6   Hemoglobin 12.0 - 15.0 g/dL 03.4   Hematocrit 74.2 - 46.0 % 36.6   Platelets 150 - 400 K/uL 140      Assessment and plan- Patient is a 78 y.o. female with stage III  biclonal IgA kappa multiple myeloma.  She is currently in remission and on Revlimid  here for routine follow-up  Myeloma panel from 3 months ago did not show any evidence of M protein and serum free light chains were within normal limits.  Myeloma panel and light chains from today are pending.  Overall her labs are stable with no evidence of anemia.  She has mild chronic leukopenia and  thrombocytopenia which we will continue to monitor.  No evidence of hypercalcemia or AKI.  She will continue Revlimid  10 mg daily until progression or toxicity.  She will also remain on low-dose aspirin 81 mg.  I am renewing her prescription for Zofran  that she can take as needed for nausea.   Visit Diagnosis 1. High risk medication use   2. Multiple myeloma in remission (HCC)   3. Drug-induced nausea and vomiting      Dr. Seretha Dance, MD, MPH [facility] [phone] 10/04/2023 12:58 PM

## 2023-10-04 NOTE — Progress Notes (Signed)
 Patient reports she's been very weak and hasn't been able to drive since December. She also hasn't been able to leave home due to her weakness. She states that she has been experiencing some nausea and would like medication for it.

## 2023-10-05 ENCOUNTER — Other Ambulatory Visit: Payer: Self-pay

## 2023-10-05 ENCOUNTER — Other Ambulatory Visit: Payer: Self-pay | Admitting: Oncology

## 2023-10-05 DIAGNOSIS — C9001 Multiple myeloma in remission: Secondary | ICD-10-CM

## 2023-10-05 LAB — KAPPA/LAMBDA LIGHT CHAINS
Kappa free light chain: 16.9 mg/L (ref 3.3–19.4)
Kappa, lambda light chain ratio: 0.96 (ref 0.26–1.65)
Lambda free light chains: 17.6 mg/L (ref 5.7–26.3)

## 2023-10-05 MED ORDER — LENALIDOMIDE 10 MG PO CAPS: 10.0000 mg | ORAL_CAPSULE | Freq: Every day | ORAL | 0 refills | Status: DC

## 2023-10-06 LAB — MULTIPLE MYELOMA PANEL, SERUM
Albumin SerPl Elph-Mcnc: 3.4 g/dL (ref 2.9–4.4)
Albumin/Glob SerPl: 1.7 (ref 0.7–1.7)
Alpha 1: 0.2 g/dL (ref 0.0–0.4)
Alpha2 Glob SerPl Elph-Mcnc: 0.6 g/dL (ref 0.4–1.0)
B-Globulin SerPl Elph-Mcnc: 0.8 g/dL (ref 0.7–1.3)
Gamma Glob SerPl Elph-Mcnc: 0.5 g/dL (ref 0.4–1.8)
Globulin, Total: 2.1 g/dL — ABNORMAL LOW (ref 2.2–3.9)
IgA: 177 mg/dL (ref 64–422)
IgG (Immunoglobin G), Serum: 521 mg/dL — ABNORMAL LOW (ref 586–1602)
IgM (Immunoglobulin M), Srm: 27 mg/dL (ref 26–217)
Total Protein ELP: 5.5 g/dL — ABNORMAL LOW (ref 6.0–8.5)

## 2023-10-08 ENCOUNTER — Telehealth: Payer: Self-pay

## 2023-10-08 NOTE — Telephone Encounter (Signed)
 Voicemail received from patient 10/07/23 at 7:15PM saying she was returning a call. Outbound call to patient; informed reviewed chart and touched base with scheduling and do not see that someone has been trying to contact her.  Patient stated she might have been mistaken; advised to contact the center for any questions / concerns. Patient mentioned she wasn't well but is feeling better now.

## 2023-11-03 ENCOUNTER — Other Ambulatory Visit: Payer: Self-pay | Admitting: Oncology

## 2023-11-03 DIAGNOSIS — C9001 Multiple myeloma in remission: Secondary | ICD-10-CM

## 2023-11-13 ENCOUNTER — Other Ambulatory Visit: Payer: Self-pay

## 2023-11-25 ENCOUNTER — Other Ambulatory Visit: Payer: Self-pay | Admitting: Oncology

## 2023-11-25 DIAGNOSIS — C9001 Multiple myeloma in remission: Secondary | ICD-10-CM

## 2023-11-26 ENCOUNTER — Other Ambulatory Visit: Payer: Self-pay | Admitting: *Deleted

## 2023-11-26 NOTE — Telephone Encounter (Signed)
 [facility] called to get a refill for the patient.  Revlimid  10 mg pills.  And they left a message to make sure that you get the rems number on the prescription

## 2023-11-29 ENCOUNTER — Encounter: Payer: Self-pay | Admitting: Oncology

## 2023-11-29 ENCOUNTER — Encounter: Payer: Self-pay | Admitting: Hematology and Oncology

## 2023-12-24 ENCOUNTER — Other Ambulatory Visit: Payer: Self-pay | Admitting: Internal Medicine

## 2023-12-24 DIAGNOSIS — C9001 Multiple myeloma in remission: Secondary | ICD-10-CM

## 2023-12-28 ENCOUNTER — Telehealth: Payer: Self-pay | Admitting: *Deleted

## 2023-12-28 NOTE — Telephone Encounter (Signed)
 [facility] called today and they need the rems number for this patient.  Was not sent when they put the prescription in.  You can fax it at [phone].  Please get it done so that the patient's weight and weight for her medications

## 2023-12-28 NOTE — Telephone Encounter (Signed)
 Please look into this

## 2023-12-29 ENCOUNTER — Other Ambulatory Visit: Payer: Self-pay

## 2023-12-29 ENCOUNTER — Encounter: Payer: Self-pay | Admitting: Hematology and Oncology

## 2023-12-29 ENCOUNTER — Encounter: Payer: Self-pay | Admitting: Oncology

## 2023-12-29 DIAGNOSIS — C9001 Multiple myeloma in remission: Secondary | ICD-10-CM

## 2023-12-29 MED ORDER — LENALIDOMIDE 10 MG PO CAPS: 10.0000 mg | ORAL_CAPSULE | Freq: Every day | ORAL | 0 refills | Status: DC

## 2023-12-29 NOTE — Telephone Encounter (Signed)
 Outbound call to [facility] to verify Revlimid  script sent earlier today was received and can be processed. Spoke to April today that new script received and order is scheduled for delivery tomorrow.

## 2024-01-04 ENCOUNTER — Inpatient Hospital Stay: Admitting: Nurse Practitioner

## 2024-01-04 ENCOUNTER — Inpatient Hospital Stay: Attending: Oncology

## 2024-01-04 ENCOUNTER — Encounter: Payer: Self-pay | Admitting: Nurse Practitioner

## 2024-01-04 VITALS — BP 138/58 | HR 77 | Temp 97.6°F | Resp 16 | Wt 154.5 lb

## 2024-01-04 DIAGNOSIS — Z79899 Other long term (current) drug therapy: Secondary | ICD-10-CM | POA: Diagnosis not present

## 2024-01-04 DIAGNOSIS — C9001 Multiple myeloma in remission: Secondary | ICD-10-CM | POA: Diagnosis present

## 2024-01-04 LAB — CMP (CANCER CENTER ONLY)
ALT: 19 U/L (ref 0–44)
AST: 17 U/L (ref 15–41)
Albumin: 3.6 g/dL (ref 3.5–5.0)
Alkaline Phosphatase: 84 U/L (ref 38–126)
Anion gap: 7 (ref 5–15)
BUN: 19 mg/dL (ref 8–23)
CO2: 26 mmol/L (ref 22–32)
Calcium: 8.5 mg/dL — ABNORMAL LOW (ref 8.9–10.3)
Chloride: 104 mmol/L (ref 98–111)
Creatinine: 1.03 mg/dL — ABNORMAL HIGH (ref 0.44–1.00)
GFR, Estimated: 56 mL/min — ABNORMAL LOW (ref >60)
Glucose, Bld: 96 mg/dL (ref 70–99)
Potassium: 3.9 mmol/L (ref 3.5–5.1)
Sodium: 137 mmol/L (ref 135–145)
Total Bilirubin: 0.6 mg/dL (ref 0.0–1.2)
Total Protein: 5.9 g/dL — ABNORMAL LOW (ref 6.5–8.1)

## 2024-01-04 LAB — CBC WITH DIFFERENTIAL (CANCER CENTER ONLY)
Abs Immature Granulocytes: 0.04 K/uL (ref 0.00–0.07)
Basophils Absolute: 0.1 K/uL (ref 0.0–0.1)
Basophils Relative: 2 %
Eosinophils Absolute: 0.4 K/uL (ref 0.0–0.5)
Eosinophils Relative: 9 %
HCT: 37.7 % (ref 36.0–46.0)
Hemoglobin: 12.5 g/dL (ref 12.0–15.0)
Immature Granulocytes: 1 %
Lymphocytes Relative: 19 %
Lymphs Abs: 0.8 K/uL (ref 0.7–4.0)
MCH: 32.5 pg (ref 26.0–34.0)
MCHC: 33.2 g/dL (ref 30.0–36.0)
MCV: 97.9 fL (ref 80.0–100.0)
Monocytes Absolute: 0.6 K/uL (ref 0.1–1.0)
Monocytes Relative: 13 %
Neutro Abs: 2.6 K/uL (ref 1.7–7.7)
Neutrophils Relative %: 56 %
Platelet Count: 133 K/uL — ABNORMAL LOW (ref 150–400)
RBC: 3.85 MIL/uL — ABNORMAL LOW (ref 3.87–5.11)
RDW: 13.1 % (ref 11.5–15.5)
WBC Count: 4.5 K/uL (ref 4.0–10.5)
nRBC: 0 % (ref 0.0–0.2)

## 2024-01-04 NOTE — Progress Notes (Signed)
Pt in for follow up, denies any concerns.   

## 2024-01-04 NOTE — Progress Notes (Signed)
 Hematology/Oncology Consult Note [facility]  [phone] [phone]  Patient Care Team: Tobie Domino, MD as PCP - General (Family Medicine) Melanee Annah BROCKS, MD as Consulting Physician (Hematology and Oncology)   Name of the patient: Laurie [name]  [id number]  29-Sep-1945   Date of visit: 01/04/24  Diagnosis- stage IIIA IgA multiple myeloma in remission   Chief complaint/ Reason for visit-routine follow-up of multiple myeloma presently on Revlimid  and in remission  Heme/Onc history:  Patient is a 78 year old female who was diagnosed with stage III biclonal IgA multiple myeloma in June 2021. SPEP was 5.0 gm/dL on 93/98/7978. Random urine revealed 196.6 mg/dL total protein with 41.6% M-spike.  Bone marrow biopsy on 11/13/2019 revealed a hypercellular bone marrow with extensive involvement by plasma cell neoplasm. Atypical plasma cells represented 70% of all cells in the aspirate and was associated with prominent interstitial infiltrates and diffuse sheets in the clot.  Plasma cells were kappa light chain restricted.   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   Bone survey on 10/26/2019 revealed small lytic lesions in the skull. There was no acute fracture.   Patient has been on daratumumab  Revlimid  dexamethasone  regimen since July 2021.  She briefly received Retacrit  in the past but has not required it for over 1 year now.  Starting May 2024 patient has been switched to Revlimid  alone and Darzalex  has been dropped given stable disease   Patient is a [employer] Witness    Interval history- Laurie [name] is a 78 y.o. female who returns to clinic for follow up of her multiple  myeloma.  She continues Revlimid .  Reports compliance.  Says that she is tolerating well without significant side effects.  Reports some fatigue and rare nausea.  Denies other complaints.  No recent infections or use of antibiotics.  No new bone pain.  ECOG PS- 1 Pain scale- 0 Review of systems- Review of Systems  Constitutional:  Positive for malaise/fatigue. Negative for chills, fever and weight loss.  HENT:  Negative for congestion, ear discharge and nosebleeds.   Eyes:  Negative for blurred vision.  Respiratory:  Negative for cough, hemoptysis, sputum production, shortness of breath and wheezing.   Cardiovascular:  Negative for chest pain, palpitations, orthopnea and claudication.  Gastrointestinal:  Positive for nausea. Negative for abdominal pain, blood in stool, constipation, diarrhea, heartburn, melena and vomiting.  Genitourinary:  Negative for dysuria, flank pain, frequency, hematuria and urgency.  Musculoskeletal:  Negative for back pain, joint pain and myalgias.  Skin:  Negative for rash.  Neurological:  Negative for dizziness, tingling, focal weakness, seizures, weakness and headaches.  Endo/Heme/Allergies:  Does not bruise/bleed easily.  Psychiatric/Behavioral:  Negative for depression and suicidal ideas. The patient does not have insomnia.     Allergies  Allergen Reactions   Levofloxacin     Critical    Past Medical History:  Diagnosis Date   Anxiety    Asthma    Cellulitis and abscess of oral soft tissues    Cellulitis and abscess of oral soft tissues 03/31/2021   pt in hospital and now out as outpt   Depression    Head injury    Multiple myeloma (HCC)    Osteoporosis    PONV (postoperative nausea  and vomiting)    Past Surgical History:  Procedure Laterality Date   ABDOMINAL HYSTERECTOMY     CATARACT EXTRACTION W/PHACO Right 07/27/2022   Procedure: CATARACT EXTRACTION PHACO AND INTRAOCULAR LENS PLACEMENT (IOC) RIGHT VISION BLUE;  Surgeon: Enola Feliciano Hugger, MD;  Location: [facility];  Service: Ophthalmology;  Laterality: Right;  21.53 2:00.6   HEMORROIDECTOMY     MOUTH SURGERY     SEPTOPLASTY     Social History   Socioeconomic History   Marital status: Widowed    Spouse name: Not on file   Number of children: Not on file   Years of education: Not on file   Highest education level: Not on file  Occupational History   Not on file  Tobacco Use   Smoking status: Never   Smokeless tobacco: Never  Vaping Use   Vaping status: Never Used  Substance and Sexual Activity   Alcohol use: No   Drug use: No   Sexual activity: Not Currently  Other Topics Concern   Not on file  Social History Narrative   [employer] Witness    Social Drivers of Health   Financial Resource Strain: Low Risk  (08/12/2022)   Received from [facility]   Overall Financial Resource Strain (CARDIA)    Difficulty of Paying Living Expenses: Not very hard  Food Insecurity: No Food Insecurity (08/12/2022)   Received from [facility]   Hunger Vital Sign    Within the past 12 months, you worried that your food would run out before you got the money to buy more.: Never true    Within the past 12 months, the food you bought just didn't last and you didn't have money to get more.: Never true  Transportation Needs: No Transportation Needs (08/12/2022)   Received from [facility]   PRAPARE - Transportation    Lack of Transportation (Medical): No    Lack of Transportation (Non-Medical): No  Physical Activity: Not on file  Stress: Not on file  Social Connections: Not on file  Intimate Partner Violence: Not on file   Family History  Problem Relation Age of Onset   Prostate cancer Brother    Throat cancer Maternal Aunt    Breast cancer Cousin        mat cousin   Breast cancer Cousin    Thyroid  cancer Other     Current Outpatient Medications:    acetaminophen  (TYLENOL ) 325 MG tablet, Take 650 mg by mouth every 8 (eight) hours as needed for  mild pain (pain score 1-3)., Disp: , Rfl:    acyclovir  (ZOVIRAX ) 400 MG tablet, TAKE 1 TABLET BY MOUTH TWICE A DAY, Disp: 180 tablet, Rfl: 3   albuterol  (VENTOLIN  HFA) 108 (90 Base) MCG/ACT inhaler, 2 inhalations every 4 (four) hours as needed., Disp: , Rfl:    aspirin  81 MG chewable tablet, Chew by mouth daily., Disp: , Rfl:    atorvastatin  (LIPITOR) 20 MG tablet, Take 20 mg by mouth daily., Disp: , Rfl:    busPIRone  (BUSPAR ) 10 MG tablet, Take 10 mg by mouth 2 (two) times daily., Disp: , Rfl:    Calcium  Carb-Cholecalciferol (CALCIUM  600 + D PO), Take 600 mg by mouth 4 (four) times daily. Increase calcium  from 600mg  4 times a day to 600mg  5 times a day starting today, Disp: , Rfl:    chlorhexidine (PERIDEX) 0.12 % solution, 15 mLs 2 (two) times daily., Disp: , Rfl:    cyanocobalamin  (VITAMIN B12) 1000 MCG  tablet, Take 1,000 mcg by mouth daily., Disp: , Rfl:    dexamethasone  (DECADRON ) 4 MG tablet, Take 5 tablets (20 mg total) by mouth once a week., Disp: 120 tablet, Rfl: 2   docusate sodium (COLACE) 100 MG capsule, Take 100 mg by mouth 2 (two) times daily., Disp: , Rfl:    lenalidomide  (REVLIMID ) 10 MG capsule, TAKE 1 CAPSULE BY MOUTH 1 TIME A DAY, Disp: 28 capsule, Rfl: 0   lenalidomide  (REVLIMID ) 10 MG capsule, Take 1 capsule (10 mg total) by mouth daily., Disp: 28 capsule, Rfl: 0   montelukast  (SINGULAIR ) 10 MG tablet, Take 10 mg by mouth daily after breakfast., Disp: , Rfl:    nortriptyline  (PAMELOR ) 50 MG capsule, Take 50 mg by mouth at bedtime., Disp: , Rfl:    omeprazole  (PRILOSEC) 20 MG capsule, Take 20 mg by mouth daily., Disp: , Rfl:    ondansetron  (ZOFRAN ) 8 MG tablet, Take 1 tablet (8 mg total) by mouth every 8 (eight) hours as needed., Disp: 60 tablet, Rfl: 3   pentoxifylline  (TRENTAL ) 400 MG CR tablet, Take 400 mg by mouth 2 (two) times daily., Disp: , Rfl:    sertraline  (ZOLOFT ) 100 MG tablet, Take 100 mg by mouth 2 (two) times daily., Disp: , Rfl:    traZODone  (DESYREL ) 50 MG  tablet, Take 50 mg by mouth at bedtime., Disp: , Rfl:    Vitamin D, Ergocalciferol, (DRISDOL) 1.25 MG (50000 UNIT) CAPS capsule, Take 1,250 mcg by mouth., Disp: , Rfl:    vitamin E 1000 UNIT capsule, Take 1,000 Units by mouth daily., Disp: , Rfl:    chlorhexidine (PERIDEX) 0.12 % solution, 15 mLs., Disp: , Rfl:    Vitamin D, Ergocalciferol, (DRISDOL) 50000 UNITS CAPS capsule, Take 50,000 Units by mouth every 30 (thirty) days. Once a month, Disp: , Rfl:   Physical exam:  Vitals:   01/04/24 1056  BP: (!) 138/58  Pulse: 77  Resp: 16  Temp: 97.6 F (36.4 C)  TempSrc: Tympanic  SpO2: 97%  Weight: 154 lb 8 oz (70.1 kg)   Physical Exam Vitals reviewed.  Constitutional:      Appearance: She is not ill-appearing.     Comments: Ambulates with a wheelchair.  Appears in no acute distress  Cardiovascular:     Rate and Rhythm: Normal rate and regular rhythm.  Pulmonary:     Effort: Pulmonary effort is normal.     Breath sounds: Normal breath sounds.  Abdominal:     General: There is no distension.     Tenderness: There is no abdominal tenderness.  Skin:    General: Skin is warm.     Coloration: Skin is not pale.  Neurological:     Mental Status: She is alert and oriented to person, place, and time.  Psychiatric:        Mood and Affect: Mood normal.        Behavior: Behavior normal.     I have personally reviewed labs listed below:    Latest Ref Rng & Units 01/04/2024   10:05 AM  CMP  Glucose 70 - 99 mg/dL 96   BUN 8 - 23 mg/dL 19   Creatinine 9.55 - 1.00 mg/dL 8.96   Sodium 864 - 854 mmol/L 137   Potassium 3.5 - 5.1 mmol/L 3.9   Chloride 98 - 111 mmol/L 104   CO2 22 - 32 mmol/L 26   Calcium  8.9 - 10.3 mg/dL 8.5   Total Protein 6.5 - 8.1 g/dL 5.9  Total Bilirubin 0.0 - 1.2 mg/dL 0.6   Alkaline Phos 38 - 126 U/L 84   AST 15 - 41 U/L 17   ALT 0 - 44 U/L 19       Latest Ref Rng & Units 01/04/2024   10:04 AM  CBC  WBC 4.0 - 10.5 K/uL 4.5   Hemoglobin 12.0 - 15.0 g/dL 87.4    Hematocrit 63.9 - 46.0 % 37.7   Platelets 150 - 400 K/uL 133      Assessment and plan- Patient is a 78 y.o. female who returns to clinic for follow up of:   Stage III biclonal IgA kappa multiple myeloma- on revlimid  maintenance. Currently in remission. June 2025 labs reviewed- no evidence of M protein. Serum free light chains were normal. Myeloma panel and light chains from today are pending. Her labs are stable overall without evidence of anemia. She has chronic thrombocytopenia and leukopenia.  Today, her WBCs are normal.  Platelet count is 133 which is stable and unchanged.  No evidence of anemia.  Her kidney function is also stable with GFR 56, creatinine 1.03.  No evidence of hypercalcemia.  Multiple myeloma panel is pending at time of visit but later resulted-no evidence of M protein and serum free light chains are within normal limits.  She continues Revlimid  10 mg daily.  Tolerating well.  We discussed continuing Revlimid  until progression of disease or unacceptable toxicity.  She will also continue low-dose aspirin  81 mg for prophylaxis. Nausea-secondary to Revlimid .  Mild.  Grade 1.  Continue Zofran  as needed.  Disposition: 3 mo- lab (CBC with differential, CMP, multiple myeloma panel, kappa lambda light chains), Dr Melanee- la    Visit Diagnosis 1. Multiple myeloma in remission (HCC)   2. High risk medication use    Tinnie Dawn, DNP, AGNP-C, [facility] [phone] (clinic) 01/04/2024

## 2024-01-05 ENCOUNTER — Other Ambulatory Visit: Payer: Self-pay

## 2024-01-05 LAB — KAPPA/LAMBDA LIGHT CHAINS
Kappa free light chain: 18.7 mg/L (ref 3.3–19.4)
Kappa, lambda light chain ratio: 0.96 (ref 0.26–1.65)
Lambda free light chains: 19.5 mg/L (ref 5.7–26.3)

## 2024-01-07 LAB — MULTIPLE MYELOMA PANEL, SERUM
Albumin SerPl Elph-Mcnc: 3.4 g/dL (ref 2.9–4.4)
Albumin/Glob SerPl: 1.8 — ABNORMAL HIGH (ref 0.7–1.7)
Alpha 1: 0.2 g/dL (ref 0.0–0.4)
Alpha2 Glob SerPl Elph-Mcnc: 0.5 g/dL (ref 0.4–1.0)
B-Globulin SerPl Elph-Mcnc: 0.8 g/dL (ref 0.7–1.3)
Gamma Glob SerPl Elph-Mcnc: 0.5 g/dL (ref 0.4–1.8)
Globulin, Total: 2 g/dL — ABNORMAL LOW (ref 2.2–3.9)
IgA: 178 mg/dL (ref 64–422)
IgG (Immunoglobin G), Serum: 538 mg/dL — ABNORMAL LOW (ref 586–1602)
IgM (Immunoglobulin M), Srm: 24 mg/dL — ABNORMAL LOW (ref 26–217)
Total Protein ELP: 5.4 g/dL — ABNORMAL LOW (ref 6.0–8.5)

## 2024-01-19 ENCOUNTER — Other Ambulatory Visit: Payer: Self-pay | Admitting: Oncology

## 2024-01-19 DIAGNOSIS — C9001 Multiple myeloma in remission: Secondary | ICD-10-CM

## 2024-01-28 ENCOUNTER — Other Ambulatory Visit: Payer: Self-pay

## 2024-01-28 ENCOUNTER — Emergency Department

## 2024-01-28 DIAGNOSIS — R531 Weakness: Secondary | ICD-10-CM | POA: Diagnosis present

## 2024-01-28 DIAGNOSIS — R2681 Unsteadiness on feet: Secondary | ICD-10-CM | POA: Insufficient documentation

## 2024-01-28 DIAGNOSIS — Z79899 Other long term (current) drug therapy: Secondary | ICD-10-CM | POA: Insufficient documentation

## 2024-01-28 DIAGNOSIS — Z7901 Long term (current) use of anticoagulants: Secondary | ICD-10-CM | POA: Diagnosis not present

## 2024-01-28 DIAGNOSIS — C9000 Multiple myeloma not having achieved remission: Secondary | ICD-10-CM | POA: Insufficient documentation

## 2024-01-28 DIAGNOSIS — Z7982 Long term (current) use of aspirin: Secondary | ICD-10-CM | POA: Insufficient documentation

## 2024-01-28 DIAGNOSIS — I6389 Other cerebral infarction: Secondary | ICD-10-CM | POA: Diagnosis not present

## 2024-01-28 DIAGNOSIS — D539 Nutritional anemia, unspecified: Secondary | ICD-10-CM | POA: Diagnosis not present

## 2024-01-28 DIAGNOSIS — J4520 Mild intermittent asthma, uncomplicated: Secondary | ICD-10-CM | POA: Diagnosis not present

## 2024-01-28 DIAGNOSIS — E538 Deficiency of other specified B group vitamins: Secondary | ICD-10-CM | POA: Insufficient documentation

## 2024-01-28 DIAGNOSIS — D472 Monoclonal gammopathy: Secondary | ICD-10-CM | POA: Diagnosis not present

## 2024-01-28 DIAGNOSIS — I700 Atherosclerosis of aorta: Secondary | ICD-10-CM | POA: Diagnosis not present

## 2024-01-28 DIAGNOSIS — M4802 Spinal stenosis, cervical region: Secondary | ICD-10-CM | POA: Diagnosis not present

## 2024-01-28 LAB — COMPREHENSIVE METABOLIC PANEL WITH GFR
ALT: 25 U/L (ref 0–44)
AST: 24 U/L (ref 15–41)
Albumin: 4 g/dL (ref 3.5–5.0)
Alkaline Phosphatase: 96 U/L (ref 38–126)
Anion gap: 10 (ref 5–15)
BUN: 21 mg/dL (ref 8–23)
CO2: 25 mmol/L (ref 22–32)
Calcium: 8.8 mg/dL — ABNORMAL LOW (ref 8.9–10.3)
Chloride: 104 mmol/L (ref 98–111)
Creatinine, Ser: 1 mg/dL (ref 0.44–1.00)
GFR, Estimated: 58 mL/min — ABNORMAL LOW (ref >60)
Glucose, Bld: 97 mg/dL (ref 70–99)
Potassium: 4.1 mmol/L (ref 3.5–5.1)
Sodium: 139 mmol/L (ref 135–145)
Total Bilirubin: 0.5 mg/dL (ref 0.0–1.2)
Total Protein: 6.8 g/dL (ref 6.5–8.1)

## 2024-01-28 LAB — DIFFERENTIAL
Abs Immature Granulocytes: 0.03 K/uL (ref 0.00–0.07)
Basophils Absolute: 0.1 K/uL (ref 0.0–0.1)
Basophils Relative: 1 %
Eosinophils Absolute: 0.4 K/uL (ref 0.0–0.5)
Eosinophils Relative: 8 %
Immature Granulocytes: 1 %
Lymphocytes Relative: 25 %
Lymphs Abs: 1.2 K/uL (ref 0.7–4.0)
Monocytes Absolute: 0.5 K/uL (ref 0.1–1.0)
Monocytes Relative: 11 %
Neutro Abs: 2.7 K/uL (ref 1.7–7.7)
Neutrophils Relative %: 54 %

## 2024-01-28 LAB — CBC
HCT: 40.8 % (ref 36.0–46.0)
Hemoglobin: 13.8 g/dL (ref 12.0–15.0)
MCH: 32.5 pg (ref 26.0–34.0)
MCHC: 33.8 g/dL (ref 30.0–36.0)
MCV: 96 fL (ref 80.0–100.0)
Platelets: 161 K/uL (ref 150–400)
RBC: 4.25 MIL/uL (ref 3.87–5.11)
RDW: 13 % (ref 11.5–15.5)
WBC: 4.9 K/uL (ref 4.0–10.5)
nRBC: 0 % (ref 0.0–0.2)

## 2024-01-28 LAB — APTT: aPTT: 31 s (ref 24–36)

## 2024-01-28 LAB — ETHANOL: Alcohol, Ethyl (B): <15 mg/dL (ref <15)

## 2024-01-28 LAB — CBG MONITORING, ED: Glucose-Capillary: 92 mg/dL (ref 70–99)

## 2024-01-28 LAB — PROTIME-INR
INR: 1 (ref 0.8–1.2)
Prothrombin Time: 13.5 s (ref 11.4–15.2)

## 2024-01-28 NOTE — ED Triage Notes (Signed)
 Weakness and visual changes since yesterday around 1000. Patient states that she has just had increased weakness and double vision today. Patient endorses that the left side is weaker. Denies any tingling or numbness in extremities.

## 2024-01-29 ENCOUNTER — Observation Stay

## 2024-01-29 ENCOUNTER — Observation Stay: Admit: 2024-01-29 | Discharge: 2024-01-29 | Disposition: A | Attending: Internal Medicine

## 2024-01-29 ENCOUNTER — Observation Stay
Admission: EM | Admit: 2024-01-29 | Discharge: 2024-02-02 | Disposition: A | Discharge status: Home / Self Care | Attending: Internal Medicine | Admitting: Internal Medicine

## 2024-01-29 ENCOUNTER — Emergency Department

## 2024-01-29 DIAGNOSIS — D539 Nutritional anemia, unspecified: Secondary | ICD-10-CM | POA: Diagnosis present

## 2024-01-29 DIAGNOSIS — R29705 NIHSS score 5: Secondary | ICD-10-CM

## 2024-01-29 DIAGNOSIS — I739 Peripheral vascular disease, unspecified: Secondary | ICD-10-CM | POA: Diagnosis not present

## 2024-01-29 DIAGNOSIS — C9000 Multiple myeloma not having achieved remission: Secondary | ICD-10-CM | POA: Diagnosis not present

## 2024-01-29 DIAGNOSIS — D472 Monoclonal gammopathy: Secondary | ICD-10-CM | POA: Diagnosis present

## 2024-01-29 DIAGNOSIS — I6389 Other cerebral infarction: Secondary | ICD-10-CM | POA: Diagnosis not present

## 2024-01-29 DIAGNOSIS — I639 Cerebral infarction, unspecified: Secondary | ICD-10-CM | POA: Diagnosis not present

## 2024-01-29 DIAGNOSIS — J4520 Mild intermittent asthma, uncomplicated: Secondary | ICD-10-CM | POA: Diagnosis present

## 2024-01-29 DIAGNOSIS — E538 Deficiency of other specified B group vitamins: Secondary | ICD-10-CM | POA: Diagnosis present

## 2024-01-29 DIAGNOSIS — R2681 Unsteadiness on feet: Secondary | ICD-10-CM | POA: Diagnosis present

## 2024-01-29 LAB — ECHOCARDIOGRAM COMPLETE
AR max vel: 1.86 cm2
AV Area VTI: 1.91 cm2
AV Area mean vel: 1.92 cm2
AV Mean grad: 3 mmHg
AV Peak grad: 5.9 mmHg
Ao pk vel: 1.21 m/s
Area-P 1/2: 6.37 cm2
Calc EF: 51.9 %
MV VTI: 1.69 cm2
P 1/2 time: 773 ms
S' Lateral: 2.7 cm
Single Plane A2C EF: 52.9 %
Single Plane A4C EF: 51.5 %

## 2024-01-29 LAB — LIPID PANEL
Cholesterol: 116 mg/dL (ref 0–200)
HDL: 56 mg/dL (ref >40)
LDL Cholesterol: 47 mg/dL (ref 0–99)
Total CHOL/HDL Ratio: 2.1 ratio
Triglycerides: 67 mg/dL (ref <150)
VLDL: 13 mg/dL (ref 0–40)

## 2024-01-29 LAB — HEMOGLOBIN A1C
Hgb A1c MFr Bld: 4.6 % — ABNORMAL LOW (ref 4.8–5.6)
Mean Plasma Glucose: 85.32 mg/dL

## 2024-01-29 MED ORDER — IOHEXOL 350 MG/ML SOLN
75.0000 mL | Freq: Once | INTRAVENOUS | Status: AC | PRN
  Administered 2024-01-29: 75 mL via INTRAVENOUS

## 2024-01-29 MED ORDER — STROKE: EARLY STAGES OF RECOVERY BOOK: Freq: Once | Status: AC

## 2024-01-29 MED ORDER — SERTRALINE HCL 50 MG PO TABS
100.0000 mg | ORAL_TABLET | Freq: Two times a day (BID) | ORAL | Status: DC
  Administered 2024-01-29 – 2024-02-02 (×9): 100 mg via ORAL
  Filled 2024-01-29 (×9): qty 2

## 2024-01-29 MED ORDER — ATORVASTATIN CALCIUM 20 MG PO TABS
20.0000 mg | ORAL_TABLET | Freq: Every day | ORAL | Status: DC
Start: 2024-01-29 — End: 2024-02-02
  Administered 2024-01-29 – 2024-02-01 (×4): 20 mg via ORAL
  Filled 2024-01-29 (×4): qty 1

## 2024-01-29 MED ORDER — ASPIRIN 81 MG PO CHEW
81.0000 mg | CHEWABLE_TABLET | Freq: Every day | ORAL | Status: DC
Start: 2024-01-29 — End: 2024-02-02
  Administered 2024-01-29 – 2024-02-02 (×5): 81 mg via ORAL
  Filled 2024-01-29 (×5): qty 1

## 2024-01-29 MED ORDER — LENALIDOMIDE 10 MG PO CAPS: 10.0000 mg | ORAL_CAPSULE | Freq: Every day | ORAL | Status: DC

## 2024-01-29 MED ORDER — PENTOXIFYLLINE ER 400 MG PO TBCR
400.0000 mg | EXTENDED_RELEASE_TABLET | Freq: Two times a day (BID) | ORAL | Status: DC
  Administered 2024-01-29 – 2024-02-02 (×8): 400 mg via ORAL
  Filled 2024-01-29 (×8): qty 1

## 2024-01-29 MED ORDER — SODIUM CHLORIDE 0.9 % IV SOLN: INTRAVENOUS | Status: DC

## 2024-01-29 MED ORDER — TRAZODONE HCL 50 MG PO TABS
50.0000 mg | ORAL_TABLET | Freq: Every day | ORAL | Status: DC
  Administered 2024-01-29 – 2024-02-01 (×4): 50 mg via ORAL
  Filled 2024-01-29 (×4): qty 1

## 2024-01-29 MED ORDER — ACETAMINOPHEN 650 MG RE SUPP: 650.0000 mg | RECTAL | Status: DC | PRN

## 2024-01-29 MED ORDER — ENOXAPARIN SODIUM 40 MG/0.4ML IJ SOSY
40.0000 mg | PREFILLED_SYRINGE | INTRAMUSCULAR | Status: DC
  Administered 2024-01-29 – 2024-02-02 (×5): 40 mg via SUBCUTANEOUS
  Filled 2024-01-29 (×5): qty 0.4

## 2024-01-29 MED ORDER — ACETAMINOPHEN 325 MG PO TABS: 650.0000 mg | ORAL_TABLET | ORAL | Status: DC | PRN

## 2024-01-29 MED ORDER — VITAMIN B-12 1000 MCG PO TABS
1000.0000 ug | ORAL_TABLET | Freq: Every day | ORAL | Status: DC
  Administered 2024-01-29 – 2024-02-02 (×5): 1000 ug via ORAL
  Filled 2024-01-29: qty 2
  Filled 2024-01-29 (×4): qty 1

## 2024-01-29 MED ORDER — ACETAMINOPHEN 160 MG/5ML PO SOLN: 650.0000 mg | ORAL | Status: DC | PRN

## 2024-01-29 MED ORDER — BUSPIRONE HCL 10 MG PO TABS
10.0000 mg | ORAL_TABLET | Freq: Two times a day (BID) | ORAL | Status: DC
  Administered 2024-01-29 – 2024-02-02 (×9): 10 mg via ORAL
  Filled 2024-01-29 (×2): qty 1
  Filled 2024-01-29: qty 2
  Filled 2024-01-29 (×6): qty 1

## 2024-01-29 NOTE — Progress Notes (Signed)
 SLP Cancellation Note  Patient Details Name: Laurie [name] MRN: [id number] DOB: [birth year]   Cancelled treatment:       Reason Eval/Treat Not Completed: SLP screened, no needs identified, will sign off  Pt seen for cognitive communication and motor speech evaluation in the setting of acute basal ganglia CVA. No focal facial weakness noted during screen. Min reduced articulation precision. Pt reporting that speech is at baseline, indicating previous jaw surgery has chronic impact on speech. Pt denied cognitive communication concerns. No further acute SLP services indicated at this time.   [place] Juelle Dickmann Clapp, MS, CCC-SLP Speech Language Pathologist Rehab Services; [facility] [phone] (ascom)      [place] J Clapp 01/29/2024, 4:18 PM

## 2024-01-29 NOTE — ED Provider Notes (Signed)
 [facility] Provider Note    Event Date/Time   First MD Initiated Contact with Patient 01/29/24 0033     (approximate)   History   Weakness   HPI  Laurie [name] is a 78 y.o. female with history of multiple myeloma, asthma, and osteoporosis who presents with weakness that she first noticed 2 days ago, mainly in the left arm, persistent course.  The patient states that she noticed it when she was trying to write and felt like the left arm was weaker than normal and not working normally.  She denies any numbness.  She reports bilateral leg weakness which is chronic and is not acutely weak in the left leg.  She denies any numbness or tingling to her face.  She also reports intermittent double vision noticed when she tries to read, however she is not having double vision currently.  She is unsure if it was involving one or both eyes.  She denies any difficulty speaking.  She denies feeling dizzy or lightheaded.  She has no chest pain or difficulty breathing.  I reviewed the past medical records.  The patient's most recent outpatient visit was with hematology on 9/2 for follow-up of her MM.   Physical Exam   Triage Vital Signs: ED Triage Vitals  Encounter Vitals Group     BP 01/28/24 2048 (!) 158/72     Girls Systolic BP Percentile --      Girls Diastolic BP Percentile --      Boys Systolic BP Percentile --      Boys Diastolic BP Percentile --      Pulse Rate 01/28/24 2048 (!) 105     Resp 01/28/24 2048 18     Temp 01/28/24 2048 98.1 F (36.7 C)     Temp Source 01/28/24 2048 Oral     SpO2 01/28/24 2048 100 %     Weight 01/28/24 2051 145 lb (65.8 kg)     Height --      Head Circumference --      Peak Flow --      Pain Score 01/28/24 2050 0     Pain Loc --      Pain Education --      Exclude from Growth Chart --     Most recent vital signs: Vitals:   01/29/24 0517 01/29/24 0530  BP:  138/61  Pulse: 83 80  Resp: (!) 21 12  Temp:  97.8 F (36.6  C)  SpO2: 99% 100%     General: Awake, no distress.  CV:  Good peripheral perfusion.  Resp:  Normal effort.  Abd:  No distention.  Other:  Very mild decreased strength left upper extremity compared to right with slight ataxia.  Otherwise 5/5 motor strength all extremities.  Normal sensation all extremities.  EOMI.  PERRLA.  No facial droop.  Normal speech.  No peripheral edema.   ED Results / Procedures / Treatments   Labs (all labs ordered are listed, but only abnormal results are displayed) Labs Reviewed  COMPREHENSIVE METABOLIC PANEL WITH GFR - Abnormal; Notable for the following components:      Result Value   Calcium  8.8 (*)    GFR, Estimated 58 (*)    All other components within normal limits  PROTIME-INR  APTT  CBC  DIFFERENTIAL  ETHANOL  LIPID PANEL  HEMOGLOBIN A1C  CBG MONITORING, ED     EKG  ED ECG REPORT I, Waylon Cassis, the attending physician, personally viewed  and interpreted this ECG.  Date: 01/29/2024 EKG Time: 01 38 Rate: 94 Rhythm: normal sinus rhythm QRS Axis: normal Intervals: Prolonged QTc ST/T Wave abnormalities: normal Narrative Interpretation: no evidence of acute ischemia    RADIOLOGY  CT head: I independently viewed and interpreted the images; there is no ICH.  Allergy report indicates the following:  IMPRESSION:  1. Generalized cerebral atrophy and microvascular disease changes of  the supratentorial brain.  2. No acute intracranial abnormality.  3. Innumerable, small lytic lesions scattered throughout the  calvarium, consistent with the patient's history of multiple  myeloma.   MR brain:  IMPRESSION:  1. 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  2. Underlying moderate chronic microvascular ischemic disease.   MR cervical spine:  IMPRESSION:  1. No acute abnormality about the cervical spine or spinal cord.  2. 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  3. Multifactorial degenerative changes with resultant multilevel  foraminal narrowing as above. Notable findings include moderate left  C4 foraminal stenosis, moderate right C5 foraminal narrowing, severe  right with moderate left C6 foraminal stenosis, with moderate left  C7 foraminal narrowing.     PROCEDURES:  Critical Care performed: No  Procedures   MEDICATIONS ORDERED IN ED: Medications   stroke: early stages of recovery book (has no administration in time range)  0.9 %  sodium chloride  infusion (has no administration in time range)  acetaminophen  (TYLENOL ) tablet 650 mg (has no administration in time range)    Or  acetaminophen  (TYLENOL ) 160 MG/5ML solution 650 mg (has no administration in time range)    Or  acetaminophen  (TYLENOL ) suppository 650 mg (has no administration in time range)  enoxaparin  (LOVENOX ) injection 40 mg (has no administration in time range)     IMPRESSION / MDM / ASSESSMENT AND PLAN / ED COURSE  I reviewed the triage vital signs and the nursing notes.  78 year old female with PMH as noted above including multiple myeloma presents with left upper extremity weakness for the last 2 days associated with some intermittent double vision.  On exam the patient does have very mild decrease strength and mild ataxia to the left upper extremity but otherwise nonfocal neurologic exam.  She is not experiencing the diplopia currently, and it is unclear if it was monocular or binocular.  Differential diagnosis includes, but is not limited to, CVA, TIA, cervical radiculopathy myelopathy, electrolyte abnormality, other metabolic cause.  There is no clinical evidence of a vascular cause.  CMP and CBC show no acute findings.  CT head is negative.  I will obtain an MRI of the brain and cervical spine and reassess.  Patient's presentation is most consistent with acute presentation with potential threat to life or bodily  function.  The patient is on the cardiac monitor to evaluate for evidence of arrhythmia and/or significant heart rate changes.  ----------------------------------------- 4:30 AM on 01/29/2024 -----------------------------------------  MRI shows an acute right basal ganglia infarct.  The patient will be admitted for stroke workup.  I consulted Dr. Cleatus from the hospitalist service; based on our discussion she agrees to evaluate the patient for admission.   FINAL CLINICAL IMPRESSION(S) / ED DIAGNOSES   Final diagnoses:  Acute CVA (cerebrovascular accident) (HCC)     Rx / DC Orders   ED Discharge Orders     None        Note:  This document was prepared using Dragon voice recognition software and may  include unintentional dictation errors.    Jacolyn Pae, MD 01/29/24 [phone]

## 2024-01-29 NOTE — Evaluation (Signed)
 Occupational Therapy Evaluation Patient Details Name: Laurie [name] MRN: [id number] DOB: 1945-09-27 Today's Date: 01/29/2024   History of Present Illness   Laurie [name] is a pleasant 78 y.o. female with medical history significant for multiple myeloma on chemotherapy, asthma, osteoporosis who presented to [facility] ED for left-sided weakness 2 days ago.  Patient is stated that first time she started noticing the symptoms when she was trying to write.  She felt that her left arm was weak and not normal.     Clinical Impressions Pt seen for OT evaluation this date. Pt lives in a mobile home with 9 steps to enter with handrails on one side, pt lives alone with family close by who assists with IADLs, pt reports she is MODI in ADLs. Pt was MODI with mobility amb with use of rollator and cane, limited community mobility. Pt currently presents with L visual field deficits which can impact her functional mobility, safety, and ability to perform ADL and IADL tasks at [facility]. Pt educated in symptoms and compensatory strategies for homonymous hemianopsia including visual tracking, safety awareness and falls prevention. Pt at [facility] level for mobility during assessment, limited safety awareness, strength and ROM WNL, decreases light touch and coordination and her L side. Noted double vision when R eye occluded. Limited visual assessment as pt had some difficulty follow directions to keep head still. Will further assess visual deficits in functional settings. Pt will required 24/7 supervision if discharged home to ensure pt safety. Pt would benefit from skilled OT services to address noted impairments and functional limitations (see below for any additional details) in order to maximize safety and independence while minimizing future risk of falls, injury, and readmission. OT will follow acutely.   If plan is discharge home, recommend the following:   Direct supervision/assist for medications management;Direct  supervision/assist for financial management;Help with stairs or ramp for entrance;Assistance with cooking/housework;A little help with bathing/dressing/bathroom;A little help with walking and/or transfers     Functional Status Assessment   Patient has had a recent decline in their functional status and demonstrates the ability to make significant improvements in function in a reasonable and predictable amount of time.     Equipment Recommendations   None recommended by OT     Recommendations for Other Services         Precautions/Restrictions   Precautions Precautions: Fall Recall of Precautions/Restrictions: Intact Restrictions Weight Bearing Restrictions Per Provider Order: No     Mobility Bed Mobility Overal bed mobility: Modified Independent             General bed mobility comments: No physical assistance to complete    Transfers Overall transfer level: Needs assistance Equipment used: Rollator (4 wheels) Transfers: Sit to/from Stand Sit to Stand: Contact guard assist           General transfer comment: STS from low toilet seat, CGA use of railings and rollator to come to full standing position      Balance Overall balance assessment: Needs assistance Sitting-balance support: No upper extremity supported, Feet supported Sitting balance-Leahy Scale: Good Sitting balance - Comments: Steady reaching within BOS   Standing balance support: During functional activity, Reliant on assistive device for balance Standing balance-Leahy Scale: Fair                             ADL either performed or assessed with clinical judgement   ADL Overall ADL's : Needs assistance/impaired Eating/Feeding: Sitting;Set  up   Grooming: Wash/dry face;Sitting;Set up               Lower Body Dressing: Set up;Sitting/lateral leans Lower Body Dressing Details (indicate cue type and reason): Donning bilateral shoes in sitting Toilet Transfer:  Ambulation;Rolling walker (2 wheels);Regular Toilet;Grab bars;Contact guard assist           Functional mobility during ADLs: Contact guard assist;Rolling walker (2 wheels) General ADL Comments: CGA with use of rollator during toilet transfer     Vision Baseline Vision/History: 1 Wears glasses Patient Visual Report: Diplopia;Eye fatigue/eye pain/headache Vision Assessment?: Yes Eye Alignment: Within Functional Limits Ocular Range of Motion: Within Functional Limits Alignment/Gaze Preference: Within Defined Limits Tracking/Visual Pursuits: Requires cues, head turns, or add eye shifts to track;Decreased smoothness of vertical tracking;Impaired - to be further tested in functional context Saccades: Decreased speed of saccadic movement Convergence: Impaired (comment) Diplopia Assessment: Disappears with one eye closed     Perception Perception: Within Functional Limits       Praxis Praxis: WFL       Pertinent Vitals/Pain Pain Assessment Pain Assessment: No/denies pain     Extremity/Trunk Assessment Upper Extremity Assessment Upper Extremity Assessment: Left hand dominant;LUE deficits/detail LUE Sensation: decreased light touch LUE Coordination: decreased fine motor;decreased gross motor   Lower Extremity Assessment Lower Extremity Assessment: Defer to PT evaluation   Cervical / Trunk Assessment Cervical / Trunk Assessment: Normal   Communication Communication Communication: No apparent difficulties   Cognition Arousal: Alert Behavior During Therapy: WFL for tasks assessed/performed Cognition: No apparent impairments             OT - Cognition Comments: A/Ox4                 Following commands: Intact       Cueing   Cueing Techniques: Verbal cues      Exercises Exercises: Other exercises Other Exercises Other Exercises: Edu: Role of OT eval, safe ADL completion, safety precautions reguarding decreases sensation         Home Living  Family/patient expects to be discharged to:: Private residence Living Arrangements: Alone Available Help at Discharge: Available PRN/intermittently;Family Type of Home: Mobile home Home Access: Stairs to enter Entrance Stairs-Number of Steps: 9 Entrance Stairs-Rails: Can reach both;Left;Right Home Layout: One level     Bathroom Shower/Tub: Sponge bathes at baseline   Bathroom Toilet: Handicapped height Bathroom Accessibility: Yes How Accessible: Accessible via walker Home Equipment: Rolling Walker (2 wheels);Cane - single point          Prior Functioning/Environment Prior Level of Function : Independent/Modified Independent             Mobility Comments: Uses rollator at baseline, can't fit throughout home, states she has DME placed throughout the home to help amb within her home and STS from surfaces ADLs Comments: MODI for most ADLs and IADLs - family assist with groceries    OT Problem List: Decreased strength;Decreased activity tolerance;Impaired balance (sitting and/or standing);Decreased coordination;Decreased safety awareness;Decreased knowledge of precautions;Decreased knowledge of use of DME or AE;Impaired sensation;Impaired UE functional use   OT Treatment/Interventions: Self-care/ADL training;Therapeutic exercise;Neuromuscular education;Energy conservation;DME and/or AE instruction;Therapeutic activities;Cognitive remediation/compensation;Visual/perceptual remediation/compensation;Patient/family education;Balance training      OT Goals(Current goals can be found in the care plan section)   Acute Rehab OT Goals Patient Stated Goal: Get better OT Goal Formulation: With patient Time For Goal Achievement: 02/12/24 Potential to Achieve Goals: Good ADL Goals Pt Will Perform Grooming: with modified independence;standing Pt Will Perform Lower  Body Dressing: with modified independence;sitting/lateral leans Pt Will Transfer to Toilet: with modified  independence;ambulating Pt Will Perform Toileting - Clothing Manipulation and hygiene: with modified independence;sit to/from stand   OT Frequency:  Min 3X/week    Co-evaluation              AM-PAC OT 6 Clicks Daily Activity     Outcome Measure Help from another person eating meals?: A Little Help from another person taking care of personal grooming?: A Little Help from another person toileting, which includes using toliet, bedpan, or urinal?: A Little Help from another person bathing (including washing, rinsing, drying)?: A Lot Help from another person to put on and taking off regular upper body clothing?: A Little Help from another person to put on and taking off regular lower body clothing?: A Little 6 Click Score: 17   End of Session Equipment Utilized During Treatment: Gait belt;Rollator (4 wheels) Nurse Communication: Mobility status  Activity Tolerance: Patient tolerated treatment well Patient left: in bed;with call bell/phone within reach;with nursing/sitter in room  OT Visit Diagnosis: Unsteadiness on feet (R26.81);Other abnormalities of gait and mobility (R26.89);Muscle weakness (generalized) (M62.81);Hemiplegia and hemiparesis;Low vision, both eyes (H54.2) Hemiplegia - Right/Left: Left Hemiplegia - dominant/non-dominant: Dominant Hemiplegia - caused by: Cerebral infarction                Time: 8578-8557 OT Time Calculation (min): 21 min Charges:  OT General Charges $OT Visit: 1 Visit OT Evaluation $OT Eval Moderate Complexity: 1 Mod OT Treatments $Self Care/Home Management : 8-22 mins  Larraine Colas M.S. OTR/L  01/29/24, 3:21 PM

## 2024-01-29 NOTE — ED Notes (Signed)
 MD Paduel at bedside during NIHSS assessment. Pt states the weakness in R leg is new and NIHSS is different from previous. Will continue to monitor

## 2024-01-29 NOTE — Progress Notes (Signed)
*  PRELIMINARY RESULTS* Echocardiogram 2D Echocardiogram has been performed.  Laurie [name] Laurie [name] 01/29/2024, 2:15 PM

## 2024-01-29 NOTE — H&P (Signed)
 History and Physical    Laurie [name] FMW:[id number] DOB: 10-07-1945 DOA: 01/29/2024  DOS: the patient was seen and examined on 01/29/2024  PCP: Tobie Domino, MD   Patient coming from: Home  I have personally briefly reviewed patient's old medical records in [facility]  Chief Complaint: Left-sided weakness  HPI: Laurie [name] is a pleasant 78 y.o. female with medical history significant for multiple myeloma on chemotherapy, asthma, osteoporosis who presented to [facility] ED for left-sided weakness 2 days ago.  Patient is stated that first time she started noticing the symptoms when she was trying to write.  She felt that her left arm was weak and not normal.  She denies any trauma to the arm, numbness, tingling.  She also stated that she has bilateral lower extremity weakness that is chronic.  She denies any difficulty speaking, dizziness, lightheadedness.  She denies any fever, chills, cough, shortness of breath, chest pain or palpitations.  ED Course: Upon arrival to the ED, patient is found to be hypertensive around 158/72, tachycardic at 105, CT brain showed no acute pathology, MRI of the brain was done which showed 1.3 cm acute ischemic nonhemorrhagic right basal ganglia infarct.  MRI of cervical spine was done which showed diffuse multilevel spinal stenosis between C3-C4 and C6-C7. Hospital service was consulted for evaluation for admission for stroke workup.  Review of Systems:  ROS  All other systems negative except as noted in the HPI.  Past Medical History:  Diagnosis Date   Anxiety    Asthma    Cellulitis and abscess of oral soft tissues    Cellulitis and abscess of oral soft tissues 03/31/2021   pt in hospital and now out as outpt   Depression    Head injury    Multiple myeloma (HCC)    Osteoporosis    PONV (postoperative nausea and vomiting)     Past Surgical History:  Procedure Laterality Date   ABDOMINAL HYSTERECTOMY     CATARACT EXTRACTION W/PHACO Right  07/27/2022   Procedure: CATARACT EXTRACTION PHACO AND INTRAOCULAR LENS PLACEMENT (IOC) RIGHT VISION BLUE;  Surgeon: Enola Feliciano Hugger, MD;  Location: [facility];  Service: Ophthalmology;  Laterality: Right;  21.53 2:00.6   HEMORROIDECTOMY     MOUTH SURGERY     SEPTOPLASTY       reports that she has never smoked. She has never used smokeless tobacco. She reports that she does not drink alcohol and does not use drugs.  Allergies  Allergen Reactions   Levofloxacin     Critical     Family History  Problem Relation Age of Onset   Prostate cancer Brother    Throat cancer Maternal Aunt    Breast cancer Cousin        mat cousin   Breast cancer Cousin    Thyroid  cancer Other     Prior to Admission medications   Medication Sig Start Date End Date Taking? Authorizing Provider  acetaminophen  (TYLENOL ) 325 MG tablet Take 650 mg by mouth every 8 (eight) hours as needed for mild pain (pain score 1-3).    [provider]  acyclovir  (ZOVIRAX ) 400 MG tablet TAKE 1 TABLET BY MOUTH TWICE A DAY 09/23/23   Melanee Annah BROCKS, MD  albuterol  (VENTOLIN  HFA) 108 [phone] Base) MCG/ACT inhaler 2 inhalations every 4 (four) hours as needed.    [provider]  aspirin 81 MG chewable tablet Chew by mouth daily.    [provider]  atorvastatin  (LIPITOR) 20  MG tablet Take 20 mg by mouth daily.    [provider]  busPIRone  (BUSPAR ) 10 MG tablet Take 10 mg by mouth 2 (two) times daily.    [provider]  Calcium  Carb-Cholecalciferol (CALCIUM  600 + D PO) Take 600 mg by mouth 4 (four) times daily. Increase calcium  from 600mg  4 times a day to 600mg  5 times a day starting today 06/13/20   [provider]  chlorhexidine (PERIDEX) 0.12 % solution 15 mLs 2 (two) times daily. 06/10/21   [provider]  chlorhexidine (PERIDEX) 0.12 % solution 15 mLs. 10/28/23   [provider]  cyanocobalamin  (VITAMIN B12) 1000 MCG tablet Take 1,000 mcg by mouth  daily. 02/08/23   [provider]  dexamethasone  (DECADRON ) 4 MG tablet Take 5 tablets (20 mg total) by mouth once a week. 10/13/21   Melanee Annah BROCKS, MD  docusate sodium (COLACE) 100 MG capsule Take 100 mg by mouth 2 (two) times daily.    [provider]  lenalidomide  (REVLIMID ) 10 MG capsule TAKE 1 CAPSULE BY MOUTH 1 TIME A DAY 12/24/23   Melanee Annah BROCKS, MD  lenalidomide  (REVLIMID ) 10 MG capsule TAKE 1 CAPSULE BY MOUTH 1 TIME A DAY 01/19/24   Melanee Annah BROCKS, MD  montelukast  (SINGULAIR ) 10 MG tablet Take 10 mg by mouth daily after breakfast.    [provider]  nortriptyline  (PAMELOR ) 50 MG capsule Take 50 mg by mouth at bedtime.    [provider]  omeprazole  (PRILOSEC) 20 MG capsule Take 20 mg by mouth daily.    [provider]  ondansetron  (ZOFRAN ) 8 MG tablet Take 1 tablet (8 mg total) by mouth every 8 (eight) hours as needed. 10/04/23   Rao, Archana C, MD  pentoxifylline (TRENTAL) 400 MG CR tablet Take 400 mg by mouth 2 (two) times daily. 11/14/21   [provider]  sertraline  (ZOLOFT ) 100 MG tablet Take 100 mg by mouth 2 (two) times daily.    [provider]  traZODone  (DESYREL ) 50 MG tablet Take 50 mg by mouth at bedtime. 03/24/21   [provider]  Vitamin D, Ergocalciferol, (DRISDOL) 1.25 MG (50000 UNIT) CAPS capsule Take 1,250 mcg by mouth. 10/28/23 10/27/24  [provider]  Vitamin D, Ergocalciferol, (DRISDOL) 50000 UNITS CAPS capsule Take 50,000 Units by mouth every 30 (thirty) days. Once a month    [provider]  vitamin E 1000 UNIT capsule Take 1,000 Units by mouth daily.    [provider]    Physical Exam: Vitals:   01/29/24 0504 01/29/24 0517 01/29/24 0530 01/29/24 0825  BP: (!) 140/69  138/61 (!) 148/73  Pulse: 82 83 80 82  Resp: (!) 22 (!) 21 12 16   Temp:   97.8 F (36.6 C)   TempSrc:   Axillary   SpO2: 100% 99% 100% 98%  Weight:        Physical Exam   Constitutional: Alert,  awake, calm, comfortable HEENT: Neck supple Respiratory: Clear to auscultation B/L, no wheezing, no rales.  Cardiovascular: Regular rate and rhythm, no murmurs / rubs / gallops. No extremity edema. 2+ pedal pulses. No carotid bruits.  Abdomen: Soft, no tenderness, Bowel sounds positive.  Musculoskeletal: Bilateral lower extremity weakness 3-4/5, upper extremities both have 4/5 power.  No sensory deficits. Skin: no rashes, lesions, ulcers. Neurologic: CN 2-12 grossly intact. Sensation intact, No focal deficit identified Psychiatric: Alert and oriented x 3. Normal mood.    Labs on Admission: I have personally reviewed following  labs and imaging studies  CBC: Recent Labs  Lab 01/28/24 2054  WBC 4.9  NEUTROABS 2.7  HGB 13.8  HCT 40.8  MCV 96.0  PLT 161   Basic Metabolic Panel: Recent Labs  Lab 01/28/24 2054  NA 139  K 4.1  CL 104  CO2 25  GLUCOSE 97  BUN 21  CREATININE 1.00  CALCIUM  8.8*   GFR: Estimated Creatinine Clearance: 43.7 mL/min (by C-G formula based on SCr of 1 mg/dL). Liver Function Tests: Recent Labs  Lab 01/28/24 2054  AST 24  ALT 25  ALKPHOS 96  BILITOT 0.5  PROT 6.8  ALBUMIN 4.0   No results for input(s): LIPASE, AMYLASE in the last 168 hours. No results for input(s): AMMONIA in the last 168 hours. Coagulation Profile: Recent Labs  Lab 01/28/24 2054  INR 1.0   Cardiac Enzymes: No results for input(s): CKTOTAL, CKMB, CKMBINDEX, TROPONINI, TROPONINIHS in the last 168 hours. BNP (last 3 results) No results for input(s): BNP in the last 8760 hours. HbA1C: No results for input(s): HGBA1C in the last 72 hours. CBG: Recent Labs  Lab 01/28/24 2101  GLUCAP 92   Lipid Profile: Recent Labs    01/29/24 0531  CHOL 116  HDL 56  LDLCALC 47  TRIG 67  CHOLHDL 2.1   Thyroid  Function Tests: No results for input(s): TSH, T4TOTAL, FREET4, T3FREE, THYROIDAB in the last 72 hours. Anemia Panel: No results for input(s):  VITAMINB12, FOLATE, FERRITIN, TIBC, IRON , RETICCTPCT in the last 72 hours. Urine analysis: No results found for: COLORURINE, APPEARANCEUR, LABSPEC, PHURINE, GLUCOSEU, HGBUR, BILIRUBINUR, KETONESUR, PROTEINUR, UROBILINOGEN, NITRITE, LEUKOCYTESUR  Radiological Exams on Admission: I have personally reviewed images MR Cervical Spine Wo Contrast Result Date: 01/29/2024 CLINICAL DATA:  Initial evaluation for cervical radiculopathy, left-sided weakness. EXAM: MRI CERVICAL SPINE WITHOUT CONTRAST TECHNIQUE: Multiplanar, multisequence MR imaging of the cervical spine was performed. No intravenous contrast was administered. COMPARISON:  None Available. FINDINGS: Alignment: Straightening of the normal cervical lordosis. No listhesis. Vertebrae: Mild chronic height loss noted at the superior endplates of T2 through T4. Vertebral body height otherwise maintained with no acute or recent fracture. Bone marrow signal intensity within normal limits. No worrisome osseous lesions or abnormal marrow edema. Cord: Normal signal and morphology. Posterior Fossa, vertebral arteries, paraspinal tissues: Patchy signal abnormality within the pons, consistent with chronic small vessel ischemic disease. Craniocervical junction normal. Paraspinous soft tissues within normal limits. Normal flow voids seen within the vertebral arteries bilaterally. Disc levels: C2-C3: Negative interspace. Moderate left facet hypertrophy. No spinal stenosis. Foramina remain patent. C3-C4: Small right paracentral disc protrusion indents the ventral thecal sac (series 9, image 10). Moderate left with mild right facet hypertrophy. Resultant mild spinal stenosis. Moderate left C4 foraminal narrowing. Right neural foramen remains patent. C4-C5: Right paracentral disc protrusion indents the right ventral thecal sac, contacting and mildly flattening the right ventral cord (series 9, image 15). No cord signal changes. Superimposed  bilateral uncovertebral and facet hypertrophy. Resultant moderate spinal stenosis. Moderate right with mild left C5 foraminal narrowing. C5-C6: Degenerative disc space narrowing with circumferential disc osteophyte complex. Flattening and partial effacement of the ventral thecal sac. Resultant mild-to-moderate spinal stenosis. Severe right with moderate left C6 foraminal narrowing. C6-C7: Degenerative vertebral disc space narrowing with diffuse disc osteophyte complex. Flattening and partial effacement of the ventral thecal sac with resultant mild spinal stenosis. Moderate left with mild right C7 foraminal narrowing. C7-T1: Mild disc bulge. Mild facet and ligament flavum hypertrophy. No spinal stenosis. Foramina remain patent.  IMPRESSION: 1. No acute abnormality about the cervical spine or spinal cord. 2. Multilevel cervical spondylosis with resultant mild to moderate diffuse spinal stenosis at C3-4 through C6-7. 3. Multifactorial degenerative changes with resultant multilevel foraminal narrowing as above. Notable findings include moderate left C4 foraminal stenosis, moderate right C5 foraminal narrowing, severe right with moderate left C6 foraminal stenosis, with moderate left C7 foraminal narrowing. Electronically Signed   By: Morene Hoard M.D.   On: 01/29/2024 02:47   MR BRAIN WO CONTRAST Result Date: 01/29/2024 CLINICAL DATA:  Initial evaluation for acute neuro deficit, stroke suspected. EXAM: MRI HEAD WITHOUT CONTRAST TECHNIQUE: Multiplanar, multiecho pulse sequences of the brain and surrounding structures were obtained without intravenous contrast. COMPARISON:  CT from 01/28/2024. FINDINGS: Brain: Cerebral volume within normal limits. Patchy T2/FLAIR hyperintensity involving the periventricular deep white matter both cerebral hemispheres as well as the pons, consistent with chronic small vessel ischemic disease, moderate in nature. 1.3 cm focus of restricted diffusion seen involving the right basal  ganglia, consistent with an acute ischemic infarct (series 9, image 25). No associated hemorrhage or mass effect. No other evidence for acute or subacute ischemia. Gray-white matter distinction otherwise maintained. No areas of chronic cortical infarction. No acute or significant chronic intracranial blood products. No mass lesion, midline shift or mass effect. No hydrocephalus or extra-axial fluid collection. Pituitary gland within normal limits. Vascular: Major intracranial vascular flow voids are maintained. Skull and upper cervical spine: Craniocervical junction normal limits. Bone marrow signal intensity normal. No scalp soft tissue abnormality. Sinuses/Orbits: Prior ocular lens replacement on the right. Paranasal sinuses are largely clear. No significant mastoid effusion. Other: None. IMPRESSION: 1. 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 2. Underlying moderate chronic microvascular ischemic disease. Electronically Signed   By: Morene Hoard M.D.   On: 01/29/2024 02:42   CT HEAD WO CONTRAST Result Date: 01/28/2024 CLINICAL DATA:  Weakness and visual changes. EXAM: CT HEAD WITHOUT CONTRAST TECHNIQUE: Contiguous axial images were obtained from the base of the skull through the vertex without intravenous contrast. RADIATION DOSE REDUCTION: This exam was performed according to the departmental dose-optimization program which includes automated exposure control, adjustment of the mA and/or kV according to patient size and/or use of iterative reconstruction technique. COMPARISON:  July 30, 2020 FINDINGS: Brain: There is generalized cerebral atrophy with widening of the extra-axial spaces and ventricular dilatation. There are areas of decreased attenuation within the white matter tracts of the supratentorial brain, consistent with microvascular disease changes. Vascular: No hyperdense vessel or unexpected calcification. Skull: Innumerable, predominantly subcentimeter lytic lesions  are again seen scattered throughout the calvarium. Sinuses/Orbits: No acute finding. Other: None. IMPRESSION: 1. Generalized cerebral atrophy and microvascular disease changes of the supratentorial brain. 2. No acute intracranial abnormality. 3. Innumerable, small lytic lesions scattered throughout the calvarium, consistent with the patient's history of multiple myeloma. Electronically Signed   By: Suzen Dials M.D.   On: 01/28/2024 21:48    EKG: My personal interpretation of EKG shows: Normal sinus rhythm at 94 bpm, no ST elevation    Assessment/Plan Principal Problem:   Acute CVA (cerebrovascular accident) (HCC) Active Problems:   Monoclonal gammopathy   B12 deficiency   Macrocytic anemia   Multiple myeloma not having achieved remission (HCC)   Asthma, intermittent, uncomplicated   Unsteady gait    Assessment and Plan: 78 year old female/PMH of multiple myeloma on chemotherapy, B12 deficiency, asthma, history of unsteady gait, macrocytic anemia, who came into the ED complaining of left-sided weakness.  1.  Acute right basal ganglia ischemic stroke - She will be placed in observation for complete stroke workup - Patient's symptoms improved on left arm and she complains right arm is slightly weak today. - She has a chronic weakness on bilateral lower extremity. - It is not clear whether this stroke is affecting her upper and lower extremities or not. - MRI of the brain showed right basal ganglia infarction. - CT angio of head and neck pending - She has been placed in stroke protocol - Neurology evaluation has been requested - PT/OT/ST  2.  Multiple myeloma - Stable - Takes oral chemotherapy. - Continue home medications - Follow-up with oncologist as outpatient  3.  Multilevel cervical spine stenosis - I am not sure if this is causing her weakness - Will continue to monitor - She may need to have neurosurgery evaluation if her symptoms were to be persistent after the  stroke evaluation by neurology   DVT prophylaxis: Lovenox  Code Status: DNR/DNI Family Communication: Family Disposition Plan: Home versus rehab Consults called: Neurology Admission status: Observation, Telemetry bed   Nena Rebel, MD [facility] 01/29/2024, 8:58 AM

## 2024-01-29 NOTE — Consult Note (Signed)
 NEUROLOGY CONSULT NOTE   Date of service: January 29, 2024 Patient Name: Laurie [name] MRN:  [id number] DOB:  06-29-1945 Chief Complaint: acute stroke Requesting Provider: Roann Gouty, MD  History of Present Illness  TIAJUANA [name] is a 78 y.o. female with hx of multiple myeloma on chemo, asthma, osteoporosis who presented to the [facility] ED for left-sided weakness starting 2 days ago.  She is left-handed and she noticed her symptoms for the first time when she was trying to write.  She felt that her left arm was weak and incoordinated and she was unable to write legibly.  She has chronic bilateral lower extremity weakness that she does not feel is significantly different.  She has no other neurologic symptoms.  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  LKW: 2 days prior to admission Modified rankin score: 2-Slight disability-UNABLE to perform all activities but does not need assistance IV Thrombolysis: no outside of window  NIHSS components Score: Comment  1a Level of Conscious 0[x]  1[]  2[]  3[]      1b LOC Questions 0[x]  1[]  2[]       1c LOC Commands 0[x]  1[]  2[]       2 Best Gaze 0[x]  1[]  2[]       3 Visual 0[x]  1[]  2[]  3[]      4 Facial Palsy 0[]  1[x]  2[]  3[]      5a Motor Arm - left 0[]  1[x]  2[]  3[]  4[]  UN[]    5b Motor Arm - Right 0[x]  1[]  2[]  3[]  4[]  UN[]    6a Motor Leg - Left 0[]  1[x]  2[]  3[]  4[]  UN[]    6b Motor Leg - Right 0[]  1[x]  2[]  3[]  4[]  UN[]    7 Limb Ataxia 0[x]  1[]  2[]  UN[]      8 Sensory 0[x]  1[]  2[]  UN[]      9 Best Language 0[x]  1[]  2[]  3[]      10 Dysarthria 0[]  1[x]  2[]  UN[]      11 Extinct. and Inattention 0[x]  1[]  2[]       TOTAL:  5      ROS   Comprehensive ROS performed and pertinent positives documented in HPI   Past History   Past Medical History:  Diagnosis Date   Anxiety    Asthma    Cellulitis and abscess of oral soft tissues    Cellulitis and abscess of oral soft tissues 03/31/2021   pt in  hospital and now out as outpt   Depression    Head injury    Multiple myeloma (HCC)    Osteoporosis    PONV (postoperative nausea and vomiting)     Past Surgical History:  Procedure Laterality Date   ABDOMINAL HYSTERECTOMY     CATARACT EXTRACTION W/PHACO Right 07/27/2022   Procedure: CATARACT EXTRACTION PHACO AND INTRAOCULAR LENS PLACEMENT (IOC) RIGHT VISION BLUE;  Surgeon: Enola Feliciano Hugger, MD;  Location: [facility];  Service: Ophthalmology;  Laterality: Right;  21.53 2:00.6   HEMORROIDECTOMY     MOUTH SURGERY     SEPTOPLASTY      Family History: Family History  Problem Relation Age of Onset   Prostate cancer Brother    Throat cancer Maternal Aunt    Breast cancer Cousin        mat cousin   Breast cancer Cousin    Thyroid  cancer Other     Social History  reports that she has never smoked. She has never used smokeless tobacco. She reports that she does not drink alcohol and  does not use drugs.  Allergies  Allergen Reactions   Levofloxacin     Critical  Other Reaction(s): Itching hives    Medications   Current Facility-Administered Medications:    [START ON 01/30/2024]  stroke: early stages of recovery book, , Does not apply, Once, Cleatus Delayne GAILS, MD   0.9 %  sodium chloride  infusion, , Intravenous, Continuous, Cleatus Delayne GAILS, MD, Last Rate: 40 mL/hr at 01/29/24 0837, New Bag at 01/29/24 0837   acetaminophen  (TYLENOL ) tablet 650 mg, 650 mg, Oral, Q4H PRN **OR** acetaminophen  (TYLENOL ) 160 MG/5ML solution 650 mg, 650 mg, Per Tube, Q4H PRN **OR** acetaminophen  (TYLENOL ) suppository 650 mg, 650 mg, Rectal, Q4H PRN, Cleatus Delayne GAILS, MD   aspirin chewable tablet 81 mg, 81 mg, Oral, Daily, Paudel, Keshab, MD, 81 mg at 01/29/24 1254   atorvastatin  (LIPITOR) tablet 20 mg, 20 mg, Oral, Daily, Paudel, Keshab, MD   busPIRone  (BUSPAR ) tablet 10 mg, 10 mg, Oral, BID, Paudel, Keshab, MD, 10 mg at 01/29/24 1254   cyanocobalamin  (VITAMIN B12) tablet 1,000 mcg, 1,000  mcg, Oral, Daily, Paudel, Keshab, MD, 1,000 mcg at 01/29/24 1254   enoxaparin  (LOVENOX ) injection 40 mg, 40 mg, Subcutaneous, Q24H, Cleatus Delayne GAILS, MD, 40 mg at 01/29/24 0818   lenalidomide  (REVLIMID ) capsule 10 mg, 10 mg, Oral, Daily, Paudel, Keshab, MD   pentoxifylline (TRENTAL) CR tablet 400 mg, 400 mg, Oral, BID, Paudel, Keshab, MD   sertraline  (ZOLOFT ) tablet 100 mg, 100 mg, Oral, BID, Paudel, Keshab, MD, 100 mg at 01/29/24 1254   traZODone  (DESYREL ) tablet 50 mg, 50 mg, Oral, QHS, Paudel, Keshab, MD  Current Outpatient Medications:    acetaminophen  (TYLENOL ) 325 MG tablet, Take 650 mg by mouth every 8 (eight) hours as needed for mild pain (pain score 1-3)., Disp: , Rfl:    acyclovir  (ZOVIRAX ) 400 MG tablet, TAKE 1 TABLET BY MOUTH TWICE A DAY, Disp: 180 tablet, Rfl: 3   albuterol  (VENTOLIN  HFA) 108 (90 Base) MCG/ACT inhaler, 2 inhalations every 4 (four) hours as needed., Disp: , Rfl:    aspirin 81 MG chewable tablet, Chew by mouth daily., Disp: , Rfl:    busPIRone  (BUSPAR ) 10 MG tablet, Take 10 mg by mouth 3 (three) times daily., Disp: , Rfl:    Calcium  Carb-Cholecalciferol (CALCIUM  600 + D PO), Take 600 mg by mouth 4 (four) times daily. Increase calcium  from 600mg  4 times a day to 600mg  5 times a day starting today, Disp: , Rfl:    cyanocobalamin  (VITAMIN B12) 1000 MCG tablet, Take 1,000 mcg by mouth daily., Disp: , Rfl:    dexamethasone  (DECADRON ) 4 MG tablet, Take 5 tablets (20 mg total) by mouth once a week., Disp: 120 tablet, Rfl: 2   docusate sodium (COLACE) 100 MG capsule, Take 100 mg by mouth 2 (two) times daily., Disp: , Rfl:    lenalidomide  (REVLIMID ) 10 MG capsule, TAKE 1 CAPSULE BY MOUTH 1 TIME A DAY, Disp: 28 capsule, Rfl: 0   montelukast  (SINGULAIR ) 10 MG tablet, Take 10 mg by mouth daily after breakfast., Disp: , Rfl:    nortriptyline  (PAMELOR ) 50 MG capsule, Take 50 mg by mouth at bedtime., Disp: , Rfl:    omeprazole  (PRILOSEC) 20 MG capsule, Take 20 mg by mouth daily.,  Disp: , Rfl:    ondansetron  (ZOFRAN ) 8 MG tablet, Take 1 tablet (8 mg total) by mouth every 8 (eight) hours as needed., Disp: 60 tablet, Rfl: 3   pentoxifylline (TRENTAL) 400 MG CR tablet, Take 400 mg by  mouth 2 (two) times daily., Disp: , Rfl:    sertraline  (ZOLOFT ) 100 MG tablet, Take 100 mg by mouth 2 (two) times daily., Disp: , Rfl:    traZODone  (DESYREL ) 50 MG tablet, Take 50 mg by mouth at bedtime., Disp: , Rfl:    Vitamin D, Ergocalciferol, (DRISDOL) 1.25 MG (50000 UNIT) CAPS capsule, Take 1,250 mcg by mouth., Disp: , Rfl:    vitamin E 1000 UNIT capsule, Take 1,000 Units by mouth daily., Disp: , Rfl:    atorvastatin  (LIPITOR) 20 MG tablet, Take 20 mg by mouth daily. (Patient not taking: Reported on 01/29/2024), Disp: , Rfl:    chlorhexidine (PERIDEX) 0.12 % solution, 15 mLs. (Patient not taking: Reported on 01/29/2024), Disp: , Rfl:   Vitals   Vitals:   01/29/24 0530 01/29/24 0825 01/29/24 0921 01/29/24 1230  BP: 138/61 (!) 148/73  (!) 150/78  Pulse: 80 82  87  Resp: 12 16  16   Temp: 97.8 F (36.6 C)  97.6 F (36.4 C)   TempSrc: Axillary  Oral   SpO2: 100% 98%  99%  Weight:        Body mass index is 25.69 kg/m.   Physical Exam   Gen: patient lying in bed, NAD CV: extremities appear well-perfused Resp: normal WOB  Neurologic exam MS: alert, oriented x4, follows commands Speech: mild dysarthria, no aphasia CN: PERRL, VFF, EOMI, sensory intact, L NLF flattening, hearing intact to voice Motor: drift in LUE but not to bed with more pronounced distal weakness, RUE no drift, BLE mild drift symmetric Sensory: SILT Reflexes: 2+ symm with toes down bilat Coordination: FNF intact on R Gait: deferred  Labs/Imaging/Neurodiagnostic studies   CBC:  Recent Labs  Lab 2024/02/24 2054  WBC 4.9  NEUTROABS 2.7  HGB 13.8  HCT 40.8  MCV 96.0  PLT 161   Basic Metabolic Panel:  Lab Results  Component Value Date   NA 139 Feb 24, 2024   K 4.1 02-24-24   CO2 25 02-24-2024    GLUCOSE 97 Feb 24, 2024   BUN 21 February 24, 2024   CREATININE 1.00 24-Feb-2024   CALCIUM  8.8 (L) 02-24-2024   GFRNONAA 58 (L) 2024/02/24   GFRAA >60 01/31/2020   Lipid Panel:  Lab Results  Component Value Date   LDLCALC 47 01/29/2024   HgbA1c:  Lab Results  Component Value Date   HGBA1C 4.6 (L) 01/29/2024   Urine Drug Screen: No results found for: LABOPIA, COCAINSCRNUR, LABBENZ, AMPHETMU, THCU, LABBARB  Alcohol Level     Component Value Date/Time   [facility] <15 24-Feb-2024 2054   INR  Lab Results  Component Value Date   INR 1.0 February 24, 2024   APTT  Lab Results  Component Value Date   APTT 31 Feb 24, 2024   AED levels: No results found for: PHENYTOIN, ZONISAMIDE, LAMOTRIGINE, LEVETIRACETA   CT angio Head and Neck with contrast(Personally reviewed): 1. Negative for large vessel occlusion.   2. Age mild for age extracranial atherosclerosis, no extracranial stenosis. More advanced intracranial atherosclerosis. Calcified ICA siphon plaque with up to moderate supraclinoid right ICA stenosis. And short segment moderate to severe Left PCA P2 segment stenosis.   3.  Stable non contrast CT appearance of the brain.   4.  Aortic Atherosclerosis (ICD10-I70.0).  MRI brain wo contrast  Stroke Labs     Component Value Date/Time   CHOL 116 01/29/2024 0531   TRIG 67 01/29/2024 0531   HDL 56 01/29/2024 0531   CHOLHDL 2.1 01/29/2024 0531   VLDL 13 01/29/2024 0531  LDLCALC 47 01/29/2024 0531    Lab Results  Component Value Date/Time   HGBA1C 4.6 (L) 01/29/2024 05:31 AM   TTE pending  ASSESSMENT   CATERINA RACINE is a 78 y.o. female with hx of multiple myeloma on chemo, asthma, osteoporosis who presented to the [facility] ED for left-sided weakness starting 2 days ago who was found to have an acute R basal ganglia stroke on MRI. Etiology of stroke is favored to be small vessel disease.  RECOMMENDATIONS   - No indication for permissive HTN >48 hrs out from symptom  onset - ASA 81mg  daily + plavix 75mg  daily x90 days f/b plavix 75mg  daily monotherapy after that (90 days 2/2 severe intracranial stenosis) - Atorvastatin  20mg  daily - PT/OT/SLP - Stroke education - Amb referral to neurology upon discharge - F/u TTE, if no intracardiac clot or other significant abnormalities OK to discharge home after cleared by PT/OT  ______________________________________________________________________    Signed, Elida CHRISTELLA Ross, MD [facility]

## 2024-01-30 DIAGNOSIS — I639 Cerebral infarction, unspecified: Secondary | ICD-10-CM | POA: Diagnosis not present

## 2024-01-30 LAB — GLUCOSE, CAPILLARY: Glucose-Capillary: 92 mg/dL (ref 70–99)

## 2024-01-30 MED ORDER — ONDANSETRON HCL 4 MG/2ML IJ SOLN
4.0000 mg | Freq: Four times a day (QID) | INTRAMUSCULAR | Status: DC | PRN
  Administered 2024-01-30 – 2024-02-02 (×3): 4 mg via INTRAVENOUS
  Filled 2024-01-30 (×2): qty 2

## 2024-01-30 NOTE — Plan of Care (Signed)
 Problem: Education: Goal: Knowledge of disease or condition will improve 01/30/2024 0405 by Vicci Delon PARAS, RN Outcome: Progressing 01/30/2024 0404 by Vicci Delon PARAS, RN Outcome: Progressing Goal: Knowledge of secondary prevention will improve (MUST DOCUMENT ALL) 01/30/2024 0405 by Vicci Delon PARAS, RN Outcome: Progressing 01/30/2024 0404 by Vicci Delon PARAS, RN Outcome: Progressing Goal: Knowledge of patient specific risk factors will improve (DELETE if not current risk factor) 01/30/2024 0405 by Vicci Delon PARAS, RN Outcome: Progressing 01/30/2024 0404 by Vicci Delon PARAS, RN Outcome: Progressing   Problem: Ischemic Stroke/TIA Tissue Perfusion: Goal: Complications of ischemic stroke/TIA will be minimized 01/30/2024 0405 by Vicci Delon PARAS, RN Outcome: Progressing 01/30/2024 0404 by Vicci Delon PARAS, RN Outcome: Progressing   Problem: Coping: Goal: Will verbalize positive feelings about self 01/30/2024 0405 by Vicci Delon PARAS, RN Outcome: Progressing 01/30/2024 0404 by Vicci Delon PARAS, RN Outcome: Progressing Goal: Will identify appropriate support needs 01/30/2024 0405 by Vicci Delon PARAS, RN Outcome: Progressing 01/30/2024 0404 by Vicci Delon PARAS, RN Outcome: Progressing   Problem: Health Behavior/Discharge Planning: Goal: Ability to manage health-related needs will improve 01/30/2024 0405 by Vicci Delon PARAS, RN Outcome: Progressing 01/30/2024 0404 by Vicci Delon PARAS, RN Outcome: Progressing Goal: Goals will be collaboratively established with patient/family 01/30/2024 0405 by Vicci Delon PARAS, RN Outcome: Progressing 01/30/2024 0404 by Vicci Delon PARAS, RN Outcome: Progressing   Problem: Self-Care: Goal: Ability to participate in self-care as condition permits will improve 01/30/2024 0405 by Vicci Delon PARAS, RN Outcome: Progressing 01/30/2024 0404 by Vicci Delon PARAS, RN Outcome: Progressing Goal: Verbalization  of feelings and concerns over difficulty with self-care will improve 01/30/2024 0405 by Vicci Delon PARAS, RN Outcome: Progressing 01/30/2024 0404 by Vicci Delon PARAS, RN Outcome: Progressing Goal: Ability to communicate needs accurately will improve 01/30/2024 0405 by Vicci Delon PARAS, RN Outcome: Progressing 01/30/2024 0404 by Vicci Delon PARAS, RN Outcome: Progressing   Problem: Nutrition: Goal: Risk of aspiration will decrease 01/30/2024 0405 by Vicci Delon PARAS, RN Outcome: Progressing 01/30/2024 0404 by Vicci Delon PARAS, RN Outcome: Progressing Goal: Dietary intake will improve 01/30/2024 0405 by Vicci Delon PARAS, RN Outcome: Progressing 01/30/2024 0404 by Vicci Delon PARAS, RN Outcome: Progressing   Problem: Education: Goal: Knowledge of General Education information will improve Description: Including pain rating scale, medication(s)/side effects and non-pharmacologic comfort measures 01/30/2024 0405 by Vicci Delon PARAS, RN Outcome: Progressing 01/30/2024 0404 by Vicci Delon PARAS, RN Outcome: Progressing   Problem: Health Behavior/Discharge Planning: Goal: Ability to manage health-related needs will improve 01/30/2024 0405 by Vicci Delon PARAS, RN Outcome: Progressing 01/30/2024 0404 by Vicci Delon PARAS, RN Outcome: Progressing   Problem: Clinical Measurements: Goal: Ability to maintain clinical measurements within normal limits will improve 01/30/2024 0405 by Vicci Delon PARAS, RN Outcome: Progressing 01/30/2024 0404 by Vicci Delon PARAS, RN Outcome: Progressing Goal: Will remain free from infection 01/30/2024 0405 by Vicci Delon PARAS, RN Outcome: Progressing 01/30/2024 0404 by Vicci Delon PARAS, RN Outcome: Progressing Goal: Diagnostic test results will improve 01/30/2024 0405 by Vicci Delon PARAS, RN Outcome: Progressing 01/30/2024 0404 by Vicci Delon PARAS, RN Outcome: Progressing Goal: Respiratory complications will  improve 01/30/2024 0405 by Vicci Delon PARAS, RN Outcome: Progressing 01/30/2024 0404 by Vicci Delon PARAS, RN Outcome: Progressing Goal: Cardiovascular complication will be avoided 01/30/2024 0405 by Vicci Delon PARAS, RN Outcome: Progressing 01/30/2024 0404 by Vicci Delon PARAS, RN Outcome: Progressing   Problem: Activity: Goal: Risk for activity intolerance will decrease 01/30/2024 0405 by Vicci Delon PARAS, RN Outcome: Progressing 01/30/2024 0404 by  Vicci Delon PARAS, RN Outcome: Progressing   Problem: Nutrition: Goal: Adequate nutrition will be maintained 01/30/2024 0405 by Vicci Delon PARAS, RN Outcome: Progressing 01/30/2024 0404 by Vicci Delon PARAS, RN Outcome: Progressing   Problem: Coping: Goal: Level of anxiety will decrease 01/30/2024 0405 by Vicci Delon PARAS, RN Outcome: Progressing 01/30/2024 0404 by Vicci Delon PARAS, RN Outcome: Progressing   Problem: Elimination: Goal: Will not experience complications related to bowel motility 01/30/2024 0405 by Vicci Delon PARAS, RN Outcome: Progressing 01/30/2024 0404 by Vicci Delon PARAS, RN Outcome: Progressing Goal: Will not experience complications related to urinary retention 01/30/2024 0405 by Vicci Delon PARAS, RN Outcome: Progressing 01/30/2024 0404 by Vicci Delon PARAS, RN Outcome: Progressing   Problem: Pain Managment: Goal: General experience of comfort will improve and/or be controlled 01/30/2024 0405 by Vicci Delon PARAS, RN Outcome: Progressing 01/30/2024 0404 by Vicci Delon PARAS, RN Outcome: Progressing   Problem: Safety: Goal: Ability to remain free from injury will improve 01/30/2024 0405 by Vicci Delon PARAS, RN Outcome: Progressing 01/30/2024 0404 by Vicci Delon PARAS, RN Outcome: Progressing   Problem: Skin Integrity: Goal: Risk for impaired skin integrity will decrease 01/30/2024 0405 by Vicci Delon PARAS, RN Outcome: Progressing 01/30/2024 0404 by Vicci Delon PARAS, RN Outcome: Progressing

## 2024-01-30 NOTE — TOC Initial Note (Addendum)
 Transition of Care [facility]) - Initial/Assessment Note    Patient Details  Name: Laurie [name] MRN: [id number] Date of Birth: May 25, 1945  [facility]) CM/SW Contact:    [place] L Brendolyn Stockley, LCSW Phone Number: 01/30/2024, 11:15 AM  Clinical Narrative:                  CSW reviewed chart. Secure chat sent to attending and OT. There are recommendations for HHPT/OT however, it is noted that patient needs 24/7 supervision. Home Health would need to know that patient would have a safe plan for supervision before the start of any services.   CSW contacted niece, Laurie [name], who advised that patient does not want to go to SNF. She stated that she can speak with patient. CSW urged for there to be a serious conversation regarding safety. Patient can not be left alone.   CSW went to the room to discuss this with patient. Patient was very positive and receptive during the interaction. Patient confirmed that she resides alone. She reported that she has DME re: RW and BSC. She stated that she is homebound. 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 She stated that a fellow witness, Laurie [name], is a retired Charity fundraiser and she comes in to assist. Patient acknowledges that she has had a lot of falls and decline in her mobility.   CSW expressed concerns that it is not safe for patient to reside independently unless she has 24/7 supervision. If patient insists on going home, she will need [facility] services however, it would be difficult to set those services up because it is well documented that 24/7 is needed.   After much discussion, patient agreed that SNF is the better option for her to allow more time for services in the home to be coordinated.   CSW will update medical team. FL2 will be completed. Patient advised of no SNF choice other than she wants to remain in [facility].   11:55am: Update received from OT. Patient did well. There are no concerns  regarding her return home with Home Health.   CSW contacted [place] with [facility]. Per conversation with patient, although patient was agreeable to SNF, she expressed that she did want to try going home before she considered rehab.    CSW is awaiting on disposition from PT.   12:43pm: Update received from PT. PT advised that there was a concern for safety and patients falls. Patient reported feeling weaker. If she went home some initial 24/7 supervision/assistance would be good until she returned to baseline.     Patient Goals and CMS Choice            Expected Discharge Plan and Services                                              Prior Living Arrangements/Services                       Activities of Daily Living   ADL Screening (condition at time of admission) Independently performs ADLs?: Yes (appropriate for developmental age) Is the patient deaf or have difficulty hearing?: No Does the patient have difficulty seeing, even when wearing glasses/contacts?: No Does the patient have difficulty concentrating, remembering, or making decisions?: No  Permission Sought/Granted  Emotional Assessment              Admission diagnosis:  Acute CVA (cerebrovascular accident) [facility]) [I63.9] Patient Active Problem List   Diagnosis Date Noted   Acute CVA (cerebrovascular accident) (HCC) 01/29/2024   Abscess of tongue 03/31/2021   Maintenance chemotherapy 03/31/2021   Hypocalcemia 03/13/2020   Chemotherapy-induced neutropenia 01/28/2020   Encounter for antineoplastic immunotherapy 12/03/2019   Multiple myeloma not having achieved remission (HCC) 11/23/2019   Anemia 11/23/2019   Lytic bone lesions on xray 11/13/2019   Goals of care, counseling/discussion 11/13/2019   Monoclonal gammopathy 10/26/2019   B12 deficiency 10/26/2019   Macrocytic anemia 10/26/2019   Aphasia 07/18/2015   Neck pain 07/18/2015   Unsteady gait 07/18/2015    Acute post-traumatic headache, not intractable 03/14/2015   Asthma, intermittent, uncomplicated 01/18/2014   PCP:  Tobie Domino, MD Pharmacy:   [facility] [address] [place] - [address] [place] [address] Phone: [phone] Fax: [phone]  [facility] [place] [place] - [address] [place] [address] Phone: [phone] Fax: [phone]  [facility] [place] [facility] [place] - [address] [place] [address] Phone: [phone] Fax: [phone]     Social Drivers of Health (SDOH) Social History: SDOH Screenings   Food Insecurity: No Food Insecurity (01/29/2024)  Housing: Low Risk  (01/29/2024)  Transportation Needs: No Transportation Needs (01/29/2024)  Utilities: Not At Risk (01/29/2024)  Financial Resource Strain: Low Risk  (08/12/2022)   Received from [facility]  Social Connections: Moderately Integrated (01/29/2024)  Tobacco Use: Low Risk  (01/28/2024)   SDOH Interventions:     Readmission Risk Interventions     No data to display

## 2024-01-30 NOTE — Progress Notes (Signed)
 Physical Therapy Evaluation Patient Details Name: Laurie [name] MRN: [id number] DOB: 01/22/1946 Today's Date: 01/30/2024  History of Present Illness  Pt is a pleasant 78 y.o. female with medical history significant for multiple myeloma on chemotherapy, asthma, osteoporosis who presented to [facility] ED for left-sided weakness. MRI of the brain revealed an acute right basal ganglia ischemic stroke.   Clinical Impression  Pt presented supine in bed with HOB elevated, awake and willing to participate in therapy session. Prior to admission, pt reported that she was able to ambulate with use of her rollator and completed ADLs independently. She lives alone in a mobile home with several steps to enter (with rails). At the time of evaluation, pt endorsing feeling weak and fatigued but able to completed bed mobility at a mod Ind level, transfers with supervision and ambulated a short distance into the hallway with use of rollator and CGA for safety. Pt will need initial 24/7 supervision if discharged home to ensure pt safety. Pt would benefit from skilled PT services to address noted impairments and functional limitations (see below for any additional details) in order to maximize safety and independence while minimizing future risk of falls, injury, and readmission. PT will follow acutely and progress mobility as tolerated.        If plan is discharge home, recommend the following: A little help with walking and/or transfers;A little help with bathing/dressing/bathroom;Assistance with cooking/housework;Assist for transportation;Help with stairs or ramp for entrance   Can travel by private vehicle        Equipment Recommendations None recommended by PT  Recommendations for Other Services       Functional Status Assessment Patient has had a recent decline in their functional status and demonstrates the ability to make significant improvements in function in a reasonable and predictable amount of time.      Precautions / Restrictions Precautions Precautions: Fall Recall of Precautions/Restrictions: Intact Restrictions Weight Bearing Restrictions Per Provider Order: No      Mobility  Bed Mobility Overal bed mobility: Modified Independent             General bed mobility comments: No physical assistance to complete    Transfers Overall transfer level: Needs assistance Equipment used: Rollator (4 wheels) Transfers: Sit to/from Stand Sit to Stand: Supervision           General transfer comment: pt able to stand from EOB without physical assistance from PT, with use of rollator and supervision for safety    Ambulation/Gait Ambulation/Gait assistance: Contact guard assist Gait Distance (Feet): 40 Feet Assistive device: Rollator (4 wheels) Gait Pattern/deviations: Step-to pattern, Decreased stride length Gait velocity: decreased     General Gait Details: pt with slow, cautious and guarded gait with no overt LOB or need for external physical assistance, CGA for safety  Stairs            Wheelchair Mobility     Tilt Bed    Modified Rankin (Stroke Patients Only) Modified Rankin (Stroke Patients Only) Pre-Morbid Rankin Score: No symptoms Modified Rankin: Moderately severe disability     Balance Overall balance assessment: Needs assistance Sitting-balance support: No upper extremity supported, Feet supported Sitting balance-Leahy Scale: Good     Standing balance support: During functional activity, Reliant on assistive device for balance Standing balance-Leahy Scale: Poor                               Pertinent Vitals/Pain Pain Assessment  Pain Assessment: No/denies pain    Home Living Family/patient expects to be discharged to:: Private residence Living Arrangements: Alone Available Help at Discharge: Available PRN/intermittently;Family Type of Home: Mobile home Home Access: Stairs to enter Entrance Stairs-Rails: Can reach  both;Left;Right Entrance Stairs-Number of Steps: 9   Home Layout: One level Home Equipment: Agricultural consultant (2 wheels);Cane - single point      Prior Function Prior Level of Function : Independent/Modified Independent             Mobility Comments: Uses rollator at baseline, can't fit throughout home, states she has DME placed throughout the home to help amb within her home and STS from surfaces ADLs Comments: MODI for most ADLs and IADLs - she gets groceries, food and meds delivered to her home     Extremity/Trunk Assessment   Upper Extremity Assessment Upper Extremity Assessment: Left hand dominant;Defer to OT evaluation    Lower Extremity Assessment Lower Extremity Assessment: Generalized weakness    Cervical / Trunk Assessment Cervical / Trunk Assessment: Normal  Communication   Communication Communication: No apparent difficulties    Cognition Arousal: Alert Behavior During Therapy: WFL for tasks assessed/performed   PT - Cognitive impairments: Safety/Judgement                       PT - Cognition Comments: pt very pleasant and chatty throughout, needing some redirection to attend to tasks Following commands: Intact       Cueing Cueing Techniques: Verbal cues     General Comments      Exercises     Assessment/Plan    PT Assessment Patient needs continued PT services  PT Problem List Decreased strength;Decreased range of motion;Decreased activity tolerance;Decreased balance;Decreased mobility;Decreased coordination;Decreased knowledge of use of DME;Decreased safety awareness;Decreased knowledge of precautions       PT Treatment Interventions DME instruction;Gait training;Stair training;Functional mobility training;Therapeutic activities;Therapeutic exercise;Balance training;Neuromuscular re-education;Patient/family education    PT Goals (Current goals can be found in the Care Plan section)  Acute Rehab PT Goals Patient Stated Goal: to  return home PT Goal Formulation: With patient Time For Goal Achievement: 02/13/24 Potential to Achieve Goals: Good    Frequency Min 3X/week     Co-evaluation               AM-PAC PT 6 Clicks Mobility  Outcome Measure Help needed turning from your back to your side while in a flat bed without using bedrails?: None Help needed moving from lying on your back to sitting on the side of a flat bed without using bedrails?: None Help needed moving to and from a bed to a chair (including a wheelchair)?: A Little Help needed standing up from a chair using your arms (e.g., wheelchair or bedside chair)?: None Help needed to walk in hospital room?: A Little Help needed climbing 3-5 steps with a railing? : A Little 6 Click Score: 21    End of Session   Activity Tolerance: Patient tolerated treatment well Patient left: in bed;with call bell/phone within reach;with bed alarm set Nurse Communication: Mobility status PT Visit Diagnosis: Other abnormalities of gait and mobility (R26.89)    Time: 9180-9163 PT Time Calculation (min) (ACUTE ONLY): 17 min   Charges:   PT Evaluation $PT Eval Low Complexity: 1 Low   PT General Charges $$ ACUTE PT VISIT: 1 Visit         Delon LABOR, PT, DPT  Acute Rehabilitation Services Office [phone]   [facility]  01/30/2024, 9:44 AM

## 2024-01-30 NOTE — Progress Notes (Signed)
 Occupational Therapy Treatment Patient Details Name: Laurie [name] MRN: [id number] DOB: 1946/02/08 Today's Date: 01/30/2024   History of present illness Pt is a pleasant 78 y.o. female with medical history significant for multiple myeloma on chemotherapy, asthma, osteoporosis who presented to [facility] ED for left-sided weakness. MRI of the brain revealed an acute right basal ganglia ischemic stroke.   OT comments  Pt demonstrates improvement this date, with increased strength in L UE. Pt able to perform bed mobility w/ Mod I, SUPV for transfers and ambulation, CGA for toileting and grooming in standing. Pt reports having friends who can provide daytime support at home PRN. Recommend DC with home-based rehab services.      If plan is discharge home, recommend the following:  Direct supervision/assist for medications management;Direct supervision/assist for financial management;Help with stairs or ramp for entrance;Assistance with cooking/housework;A little help with bathing/dressing/bathroom;A little help with walking and/or transfers   Equipment Recommendations  None recommended by OT    Recommendations for Other Services      Precautions / Restrictions Precautions Precautions: Fall Restrictions Weight Bearing Restrictions Per Provider Order: No       Mobility Bed Mobility Overal bed mobility: Modified Independent                  Transfers Overall transfer level: Needs assistance Equipment used: Rollator (4 wheels) Transfers: Sit to/from Stand Sit to Stand: Supervision           General transfer comment: SUPV for safety, no physical assistance required     Balance Overall balance assessment: Needs assistance Sitting-balance support: No upper extremity supported, Feet supported Sitting balance-Leahy Scale: Good Sitting balance - Comments: Steady reaching within BOS   Standing balance support: During functional activity, Reliant on assistive device for balance  (using rollator) Standing balance-Leahy Scale: Fair Standing balance comment: able to maintain standing balance for 5+ minutes with light support on rollator                           ADL either performed or assessed with clinical judgement   ADL Overall ADL's : Needs assistance/impaired Eating/Feeding: Supervision/ safety;Sitting   Grooming: Wash/dry hands;Wash/dry face;Standing                   Toilet Transfer: Regular Toilet;Contact guard assist;Rollator (4 wheels);Ambulation             General ADL Comments: CGA with use of rollator during toilet transfer    Extremity/Trunk Assessment Upper Extremity Assessment Upper Extremity Assessment: Left hand dominant;Overall [facility] for tasks assessed   Lower Extremity Assessment Lower Extremity Assessment: Generalized weakness        Vision       Perception     Praxis     Communication     Cognition Arousal: Alert Behavior During Therapy: WFL for tasks assessed/performed Cognition: No apparent impairments                                        Cueing      Exercises Other Exercises Other Exercises: Educ re: falls prevention, home safety, visual scanning    Shoulder Instructions       General Comments      Pertinent Vitals/ Pain       Pain Assessment Pain Assessment: No/denies pain  Home Living  Prior Functioning/Environment              Frequency  Min 3X/week        Progress Toward Goals  OT Goals(current goals can now be found in the care plan section)  Progress towards OT goals: Progressing toward goals     Plan      Co-evaluation                 AM-PAC OT 6 Clicks Daily Activity     Outcome Measure   Help from another person eating meals?: A Little Help from another person taking care of personal grooming?: A Little Help from another person toileting, which includes using toliet,  bedpan, or urinal?: A Little Help from another person bathing (including washing, rinsing, drying)?: A Little Help from another person to put on and taking off regular upper body clothing?: A Little Help from another person to put on and taking off regular lower body clothing?: A Little 6 Click Score: 18    End of Session Equipment Utilized During Treatment: Rollator (4 wheels)  OT Visit Diagnosis: Unsteadiness on feet (R26.81);Other abnormalities of gait and mobility (R26.89);Muscle weakness (generalized) (M62.81)   Activity Tolerance Patient tolerated treatment well   Patient Left in bed;with call bell/phone within reach;with family/visitor present   Nurse Communication          Time: 8944-8876 OT Time Calculation (min): 28 min  Charges: OT General Charges $OT Visit: 1 Visit OT Treatments $Self Care/Home Management : 23-37 mins Suzen Hock, PhD, MS, OTR/L 01/30/24, 1:45 PM

## 2024-01-30 NOTE — Plan of Care (Signed)

## 2024-01-30 NOTE — Progress Notes (Signed)
 Good day. Lots of company. Sat up in chair x 2 hours. Walked to the bathroom with walker. No complaints.

## 2024-01-30 NOTE — Progress Notes (Addendum)
 PROGRESS NOTE    Laurie [name]  FMW:[id number] DOB: 04/26/1946 DOA: 01/29/2024 PCP: Tobie Domino, MD   Brief Narrative:   78 y.o. female with medical history significant for multiple myeloma on chemotherapy, asthma, osteoporosis who presented to [facility] ED for left-sided weakness 2 days ago.  CT brain showed no acute pathology, MRI of the brain was done which showed 1.3 cm acute ischemic nonhemorrhagic right basal ganglia infarct.  MRI of cervical spine was done which showed diffuse multilevel spinal stenosis between C3-C4 and C6-C7. Hospital service was consulted for evaluation for admission for stroke workup.Neurology was consulted. She is on DAPT and Statin. She will likely need SNF placement.  Assessment & Plan:  Principal Problem:   Acute CVA (cerebrovascular accident) (HCC) Active Problems:   Monoclonal gammopathy   B12 deficiency   Macrocytic anemia   Multiple myeloma not having achieved remission (HCC)   Asthma, intermittent, uncomplicated   Unsteady gait    78 year old female/PMH of multiple myeloma on chemotherapy, B12 deficiency, asthma, history of unsteady gait, macrocytic anemia, who came into the ED complaining of left-sided weakness.    Acute right basal ganglia ischemic stroke,POA: - Improving symptoms of left sided weakness - MRI of the brain showed right basal ganglia infarction. - CT angio of head and neck showed no LVO. More advanced intracranial atherosclerosis. Calcified ICA siphon plaque with up to moderate supraclinoid right ICA stenosis. And short segment moderate to severe Left PCA P2 segment stenosis.  - Neurology on board. - PT/OT/ST evaluations - ASA 81mg  daily + plavix 75mg  daily x90 days f/b plavix 75mg  daily monotherapy after that (90 days 2/2 severe intracranial stenosis)  -Continue with lipitor -f/u TTE-Unremarkable.    Multiple myeloma - Stable - Takes oral lenalidomide  daily  - Follow-up with oncologist as outpatient   Multilevel cervical  spine stenosis - Will continue to monitor - Outpatient follow up with spine surgery  Disposition: She may need SNF and she lives at home by herself, is unsteady and at high risk of falls.  DVT prophylaxis: enoxaparin  (LOVENOX ) injection 40 mg Start: 01/29/24 0800     Code Status: Full Code Family Communication:  None at the bedside Status is: Observation The patient remains OBS appropriate and will d/c before 2 midnights.    Subjective:  She feels better in terms of her left arm and left leg strength. We spoke about her ischemic stroke and further management plan. She says that she lives at home by herself and doesn't feel comfortable going back home today. Pending PT evaluation.  Examination:  General exam: Appears calm and comfortable  Respiratory system: Clear to auscultation. Respiratory effort normal. Cardiovascular system: S1 & S2 heard, RRR. No JVD, murmurs, rubs, gallops or clicks. No pedal edema. Gastrointestinal system: Abdomen is nondistended, soft and nontender. No organomegaly or masses felt. Normal bowel sounds heard. Central nervous system: Alert and oriented. No focal neurological deficits. Extremities: Power 5/5 on the right side and 4/5 on the left Skin: No rashes, lesions or ulcers Psychiatry: Judgement and insight appear normal. Mood & affect appropriate.      Diet Orders (From admission, onward)     Start     Ordered   01/29/24 0846  DIET DYS 3 Room service appropriate? Yes; Fluid consistency: Thin  Diet effective now       Question Answer Comment  Room service appropriate? Yes   Fluid consistency: Thin      01/29/24 0845  Objective: Vitals:   01/29/24 2317 01/30/24 0102 01/30/24 0400 01/30/24 0800  BP: 139/84 (!) 121/99 124/67 (!) 145/66  Pulse: 77 77 77 78  Resp: 18 16 16 18   Temp: 97.8 F (36.6 C) 98.2 F (36.8 C) 98.1 F (36.7 C) 98 F (36.7 C)  TempSrc: Oral  Oral Oral  SpO2:  96% 96% 95%  Weight:      Height: 5' 3  (1.6 m)       Intake/Output Summary (Last 24 hours) at 01/30/2024 0854 Last data filed at 01/29/2024 2051 Gross per 24 hour  Intake 240 ml  Output --  Net 240 ml   Filed Weights   01/28/24 2051  Weight: 65.8 kg    Scheduled Meds:   stroke: early stages of recovery book   Does not apply Once   aspirin  81 mg Oral Daily   atorvastatin   20 mg Oral Daily   busPIRone   10 mg Oral BID   cyanocobalamin   1,000 mcg Oral Daily   enoxaparin  (LOVENOX ) injection  40 mg Subcutaneous Q24H   lenalidomide   10 mg Oral Daily   pentoxifylline  400 mg Oral BID   sertraline   100 mg Oral BID   traZODone   50 mg Oral QHS   Continuous Infusions:  Nutritional status     Body mass index is 25.69 kg/m.  Data Reviewed:   CBC: Recent Labs  Lab 01/28/24 2054  WBC 4.9  NEUTROABS 2.7  HGB 13.8  HCT 40.8  MCV 96.0  PLT 161   Basic Metabolic Panel: Recent Labs  Lab 01/28/24 2054  NA 139  K 4.1  CL 104  CO2 25  GLUCOSE 97  BUN 21  CREATININE 1.00  CALCIUM  8.8*   GFR: Estimated Creatinine Clearance: 43.7 mL/min (by C-G formula based on SCr of 1 mg/dL). Liver Function Tests: Recent Labs  Lab 01/28/24 2054  AST 24  ALT 25  ALKPHOS 96  BILITOT 0.5  PROT 6.8  ALBUMIN 4.0   No results for input(s): LIPASE, AMYLASE in the last 168 hours. No results for input(s): AMMONIA in the last 168 hours. Coagulation Profile: Recent Labs  Lab 01/28/24 2054  INR 1.0   Cardiac Enzymes: No results for input(s): CKTOTAL, CKMB, CKMBINDEX, TROPONINI in the last 168 hours. BNP (last 3 results) No results for input(s): PROBNP in the last 8760 hours. HbA1C: Recent Labs    01/29/24 0531  HGBA1C 4.6*   CBG: Recent Labs  Lab 01/28/24 2101  GLUCAP 92   Lipid Profile: Recent Labs    01/29/24 0531  CHOL 116  HDL 56  LDLCALC 47  TRIG 67  CHOLHDL 2.1   Thyroid  Function Tests: No results for input(s): TSH, T4TOTAL, FREET4, T3FREE, THYROIDAB in the last 72  hours. Anemia Panel: No results for input(s): VITAMINB12, FOLATE, FERRITIN, TIBC, IRON , RETICCTPCT in the last 72 hours. Sepsis Labs: No results for input(s): PROCALCITON, LATICACIDVEN in the last 168 hours.  No results found for this or any previous visit (from the past 240 hours).       Radiology Studies: ECHOCARDIOGRAM COMPLETE Result Date: 01/29/2024    ECHOCARDIOGRAM REPORT   Patient Name:   BRITTAINY BUCKER Date of Exam: 01/29/2024 Medical Rec #:  [id number]       Height:       63.0 in Accession #:    [id number]      Weight:       145.0 lb Date of Birth:  1946-03-13  BSA:          1.687 m Patient Age:    78 years        BP:           138/61 mmHg Patient Gender: F               HR:           77 bpm. Exam Location:  [facility] Procedure: 2D Echo, Cardiac Doppler and Color Doppler (Both Spectral and Color            Flow Doppler were utilized during procedure). Indications:     Stroke I63.9  History:         Patient has no prior history of Echocardiogram examinations.  Sonographer:     Bari Roar Referring Phys:  [id number] DELAYNE LULLA SOLIAN Diagnosing Phys: Redell Cave MD IMPRESSIONS  1. Left ventricular ejection fraction, by estimation, is 55 to 60%. The left ventricle has normal function. The left ventricle has no regional wall motion abnormalities. There is mild left ventricular hypertrophy. Left ventricular diastolic parameters are consistent with Grade I diastolic dysfunction (impaired relaxation).  2. Right ventricular systolic function is normal. The right ventricular size is normal.  3. The mitral valve is degenerative. Mild mitral valve regurgitation.  4. The aortic valve is tricuspid. Aortic valve regurgitation is trivial. FINDINGS  Left Ventricle: Left ventricular ejection fraction, by estimation, is 55 to 60%. The left ventricle has normal function. The left ventricle has no regional wall motion abnormalities. The left ventricular internal cavity size was normal in size.  There is  mild left ventricular hypertrophy. Left ventricular diastolic parameters are consistent with Grade I diastolic dysfunction (impaired relaxation). Right Ventricle: The right ventricular size is normal. No increase in right ventricular wall thickness. Right ventricular systolic function is normal. Left Atrium: Left atrial size was normal in size. Right Atrium: Right atrial size was normal in size. Pericardium: There is no evidence of pericardial effusion. Mitral Valve: The mitral valve is degenerative in appearance. Mild to moderate mitral annular calcification. Mild mitral valve regurgitation. MV peak gradient, 6.2 mmHg. The mean mitral valve gradient is 2.0 mmHg. Tricuspid Valve: The tricuspid valve is normal in structure. Tricuspid valve regurgitation is trivial. Aortic Valve: The aortic valve is tricuspid. Aortic valve regurgitation is trivial. Aortic regurgitation PHT measures 773 msec. Aortic valve mean gradient measures 3.0 mmHg. Aortic valve peak gradient measures 5.9 mmHg. Aortic valve area, by VTI measures  1.91 cm. Pulmonic Valve: The pulmonic valve was not well visualized. Pulmonic valve regurgitation is not visualized. Aorta: The aortic root and ascending aorta are structurally normal, with no evidence of dilitation. IAS/Shunts: No atrial level shunt detected by color flow Doppler.  LEFT VENTRICLE PLAX 2D LVIDd:         3.70 cm     Diastology LVIDs:         2.70 cm     LV e' medial:    5.87 cm/s LV PW:         1.20 cm     LV E/e' medial:  13.7 LV IVS:        1.30 cm     LV e' lateral:   7.07 cm/s LVOT diam:     1.70 cm     LV E/e' lateral: 11.3 LV SV:         44 LV SV Index:   26 LVOT Area:     2.27 cm  LV Volumes (MOD) LV vol d,  MOD A2C: 86.0 ml LV vol d, MOD A4C: 75.1 ml LV vol s, MOD A2C: 40.5 ml LV vol s, MOD A4C: 36.4 ml LV SV MOD A2C:     45.5 ml LV SV MOD A4C:     75.1 ml LV SV MOD BP:      44.2 ml RIGHT VENTRICLE RV Basal diam:  2.60 cm RV Mid diam:    2.40 cm RV S prime:     9.36 cm/s  TAPSE (M-mode): 1.9 cm LEFT ATRIUM             Index        RIGHT ATRIUM          Index LA diam:        3.70 cm 2.19 cm/m   RA Area:     8.11 cm LA Vol (A2C):   48.4 ml 28.70 ml/m  RA Volume:   12.40 ml 7.35 ml/m LA Vol (A4C):   48.3 ml 28.64 ml/m LA Biplane Vol: 49.6 ml 29.41 ml/m  AORTIC VALVE                    PULMONIC VALVE AV Area (Vmax):    1.86 cm     PV Vmax:          0.88 m/s AV Area (Vmean):   1.92 cm     PV Peak grad:     3.1 mmHg AV Area (VTI):     1.91 cm     PR End Diast Vel: 3.34 msec AV Vmax:           121.00 cm/s  RVOT Peak grad:   2 mmHg AV Vmean:          72.100 cm/s AV VTI:            0.230 m AV Peak Grad:      5.9 mmHg AV Mean Grad:      3.0 mmHg LVOT Vmax:         99.00 cm/s LVOT Vmean:        60.900 cm/s LVOT VTI:          0.194 m LVOT/AV VTI ratio: 0.84 AI PHT:            773 msec  AORTA Ao Root diam: 2.40 cm Ao Asc diam:  3.30 cm MITRAL VALVE                TRICUSPID VALVE MV Area (PHT): 6.37 cm     TR Peak grad:   18.5 mmHg MV Area VTI:   1.69 cm     TR Vmax:        215.00 cm/s MV Peak grad:  6.2 mmHg MV Mean grad:  2.0 mmHg     SHUNTS MV Vmax:       1.24 m/s     Systemic VTI:  0.19 m MV Vmean:      71.7 cm/s    Systemic Diam: 1.70 cm MV Decel Time: 119 msec MV E velocity: 80.20 cm/s MV A velocity: 121.00 cm/s MV E/A ratio:  0.66 MV A Prime:    9.4 cm/s Redell Cave MD Electronically signed by Redell Cave MD Signature Date/Time: 01/29/2024/5:06:57 PM    Final    CT ANGIO HEAD NECK W WO CM Result Date: 01/29/2024 CLINICAL DATA:  78 year old female with neurologic deficit. Subtle right internal capsule lacunar infarcts suspected on MRI earlier today. EXAM: CT ANGIOGRAPHY HEAD AND NECK WITH AND WITHOUT CONTRAST TECHNIQUE:  Multidetector CT imaging of the head and neck was performed using the standard protocol during bolus administration of intravenous contrast. Multiplanar CT image reconstructions and MIPs were obtained to evaluate the vascular anatomy. Carotid stenosis  measurements (when applicable) are obtained utilizing NASCET criteria, using the distal internal carotid diameter as the denominator. RADIATION DOSE REDUCTION: This exam was performed according to the departmental dose-optimization program which includes automated exposure control, adjustment of the mA and/or kV according to patient size and/or use of iterative reconstruction technique. CONTRAST:  75mL OMNIPAQUE  IOHEXOL  350 MG/ML SOLN COMPARISON:  Brain MRI 0153 hours today. Head CT yesterday. Neck CT 03/30/2021. FINDINGS: CT HEAD Brain: Stable non contrast CT appearance of the brain. Scattered bilateral white matter hypodensity, no CT correlation in the right internal capsule. No acute intracranial hemorrhage or mass effect. Calvarium and skull base: Stable visualized osseous structures. Paranasal sinuses: Visualized paranasal sinuses and mastoids are stable and well aerated. Orbits: Stable orbit and scalp soft tissues. CTA NECK Skeleton: Previous mandible ORIF. Bone mineralization in the spine is within normal limits. Stable visible cervical and upper thoracic vertebrae from the 2022 CT (mild chronic upper thoracic superior endplate compression. Upper chest: Negative. Other neck: New or increased since 2022 round and mildly lobulated hypodense right thyroid  nodule is 13 mm (no follow-up imaging recommended). Other nonvascular soft tissue spaces in the neck appear negative. Aortic arch: 3 vessel arch is mildly tortuous. Mild Calcified aortic atherosclerosis. Right carotid system: Proximal brachiocephalic artery calcified plaque without stenosis. Negative right CCA origin. Mild tortuosity. Right ICA origin calcified plaque without stenosis. Left carotid system: Tortuosity. Minimal plaque at the left ICA origin. No stenosis. Vertebral arteries: Calcified plaque and tortuosity of the right subclavian artery origin. Less than 50 % stenosis with respect to the distal vessel. Normal right vertebral artery origin. Right  vertebral artery appears mildly dominant, widely patent to the skull base with no significant plaque, no stenosis. Proximal left subclavian artery mild calcified plaque without stenosis. Mild calcified plaque at the left vertebral origin without stenosis. Tortuous left V1 segment. Non dominant but fairly normal size left vertebral artery is patent to the skull base with no plaque or stenosis. Proximal left subclavian artery mild calcified plaque without stenosis. CTA HEAD Posterior circulation: Distal vertebral arteries, PICA origins and vertebrobasilar junction are patent with no plaque or stenosis. Dominant right V4 segment. Patent basilar artery without stenosis. Normal SCA and PCA origins. Tortuous right P1 segment. Small right posterior communicating artery, the left is diminutive or absent. Right PCA branches are within normal limits. Left P2 segment 4-5 mm moderate to severe irregularity and stenosis (series 14, image 21 and series 16, image 22) with preserved distal enhancement. Anterior circulation: Both ICA siphons are patent. Left siphon calcified plaque is moderate in the supraclinoid segment, only mild stenosis results. Right siphon similar moderate calcified plaque in the distal cavernous and supraclinoid segment. Moderate supraclinoid right ICA stenosis on series 13, image 82. Normal right posterior communicating artery origin. Patent carotid termini. Normal MCA and ACA origins. Normal anterior communicating artery. Bilateral ACA branches are within normal limits. Left MCA M1 segment and bifurcation are patent without stenosis. Right MCA M1 segment and bifurcation are patent without stenosis. Bilateral MCA branches are within normal limits. Venous sinuses: Patent. Anatomic variants: Dominant right vertebral artery. Review of the MIP images confirms the above findings IMPRESSION: 1. Negative for large vessel occlusion. 2. Age mild for age extracranial atherosclerosis, no extracranial stenosis. More  advanced intracranial atherosclerosis. Calcified ICA siphon  plaque with up to moderate supraclinoid right ICA stenosis. And short segment moderate to severe Left PCA P2 segment stenosis. 3.  Stable non contrast CT appearance of the brain. 4.  Aortic Atherosclerosis (ICD10-I70.0). Electronically Signed   By: VEAR Hurst M.D.   On: 01/29/2024 10:53   MR Cervical Spine Wo Contrast Result Date: 01/29/2024 CLINICAL DATA:  Initial evaluation for cervical radiculopathy, left-sided weakness. EXAM: MRI CERVICAL SPINE WITHOUT CONTRAST TECHNIQUE: Multiplanar, multisequence MR imaging of the cervical spine was performed. No intravenous contrast was administered. COMPARISON:  None Available. FINDINGS: Alignment: Straightening of the normal cervical lordosis. No listhesis. Vertebrae: Mild chronic height loss noted at the superior endplates of T2 through T4. Vertebral body height otherwise maintained with no acute or recent fracture. Bone marrow signal intensity within normal limits. No worrisome osseous lesions or abnormal marrow edema. Cord: Normal signal and morphology. Posterior Fossa, vertebral arteries, paraspinal tissues: Patchy signal abnormality within the pons, consistent with chronic small vessel ischemic disease. Craniocervical junction normal. Paraspinous soft tissues within normal limits. Normal flow voids seen within the vertebral arteries bilaterally. Disc levels: C2-C3: Negative interspace. Moderate left facet hypertrophy. No spinal stenosis. Foramina remain patent. C3-C4: Small right paracentral disc protrusion indents the ventral thecal sac (series 9, image 10). Moderate left with mild right facet hypertrophy. Resultant mild spinal stenosis. Moderate left C4 foraminal narrowing. Right neural foramen remains patent. C4-C5: Right paracentral disc protrusion indents the right ventral thecal sac, contacting and mildly flattening the right ventral cord (series 9, image 15). No cord signal changes. Superimposed  bilateral uncovertebral and facet hypertrophy. Resultant moderate spinal stenosis. Moderate right with mild left C5 foraminal narrowing. C5-C6: Degenerative disc space narrowing with circumferential disc osteophyte complex. Flattening and partial effacement of the ventral thecal sac. Resultant mild-to-moderate spinal stenosis. Severe right with moderate left C6 foraminal narrowing. C6-C7: Degenerative vertebral disc space narrowing with diffuse disc osteophyte complex. Flattening and partial effacement of the ventral thecal sac with resultant mild spinal stenosis. Moderate left with mild right C7 foraminal narrowing. C7-T1: Mild disc bulge. Mild facet and ligament flavum hypertrophy. No spinal stenosis. Foramina remain patent. IMPRESSION: 1. No acute abnormality about the cervical spine or spinal cord. 2. Multilevel cervical spondylosis with resultant mild to moderate diffuse spinal stenosis at C3-4 through C6-7. 3. Multifactorial degenerative changes with resultant multilevel foraminal narrowing as above. Notable findings include moderate left C4 foraminal stenosis, moderate right C5 foraminal narrowing, severe right with moderate left C6 foraminal stenosis, with moderate left C7 foraminal narrowing. Electronically Signed   By: Morene Hoard M.D.   On: 01/29/2024 02:47   MR BRAIN WO CONTRAST Result Date: 01/29/2024 CLINICAL DATA:  Initial evaluation for acute neuro deficit, stroke suspected. EXAM: MRI HEAD WITHOUT CONTRAST TECHNIQUE: Multiplanar, multiecho pulse sequences of the brain and surrounding structures were obtained without intravenous contrast. COMPARISON:  CT from 01/28/2024. FINDINGS: Brain: Cerebral volume within normal limits. Patchy T2/FLAIR hyperintensity involving the periventricular deep white matter both cerebral hemispheres as well as the pons, consistent with chronic small vessel ischemic disease, moderate in nature. 1.3 cm focus of restricted diffusion seen involving the right basal  ganglia, consistent with an acute ischemic infarct (series 9, image 25). No associated hemorrhage or mass effect. No other evidence for acute or subacute ischemia. Gray-white matter distinction otherwise maintained. No areas of chronic cortical infarction. No acute or significant chronic intracranial blood products. No mass lesion, midline shift or mass effect. No hydrocephalus or extra-axial fluid collection. Pituitary gland within normal limits. Vascular:  Major intracranial vascular flow voids are maintained. Skull and upper cervical spine: Craniocervical junction normal limits. Bone marrow signal intensity normal. No scalp soft tissue abnormality. Sinuses/Orbits: Prior ocular lens replacement on the right. Paranasal sinuses are largely clear. No significant mastoid effusion. Other: None. IMPRESSION: 1. 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 2. Underlying moderate chronic microvascular ischemic disease. Electronically Signed   By: Morene Hoard M.D.   On: 01/29/2024 02:42   CT HEAD WO CONTRAST Result Date: 01/28/2024 CLINICAL DATA:  Weakness and visual changes. EXAM: CT HEAD WITHOUT CONTRAST TECHNIQUE: Contiguous axial images were obtained from the base of the skull through the vertex without intravenous contrast. RADIATION DOSE REDUCTION: This exam was performed according to the departmental dose-optimization program which includes automated exposure control, adjustment of the mA and/or kV according to patient size and/or use of iterative reconstruction technique. COMPARISON:  July 30, 2020 FINDINGS: Brain: There is generalized cerebral atrophy with widening of the extra-axial spaces and ventricular dilatation. There are areas of decreased attenuation within the white matter tracts of the supratentorial brain, consistent with microvascular disease changes. Vascular: No hyperdense vessel or unexpected calcification. Skull: Innumerable, predominantly subcentimeter lytic lesions  are again seen scattered throughout the calvarium. Sinuses/Orbits: No acute finding. Other: None. IMPRESSION: 1. Generalized cerebral atrophy and microvascular disease changes of the supratentorial brain. 2. No acute intracranial abnormality. 3. Innumerable, small lytic lesions scattered throughout the calvarium, consistent with the patient's history of multiple myeloma. Electronically Signed   By: Suzen Dials M.D.   On: 01/28/2024 21:48        LOS: 0 days   Time spent= 40 mins    Deliliah Room, MD [facility]  If 7PM-7AM, please contact night-coverage  01/30/2024, 8:54 AM

## 2024-01-31 ENCOUNTER — Other Ambulatory Visit: Payer: Self-pay

## 2024-01-31 DIAGNOSIS — I639 Cerebral infarction, unspecified: Secondary | ICD-10-CM | POA: Diagnosis not present

## 2024-01-31 NOTE — Progress Notes (Signed)
 PROGRESS NOTE    Laurie [name]  FMW:[id number] DOB: [birth year] DOA: 01/29/2024 PCP: Tobie Domino, MD   Brief Narrative:   78 y.o. female with medical history significant for multiple myeloma on chemotherapy, asthma, osteoporosis who presented to [facility] ED for left-sided weakness 2 days ago.  CT brain showed no acute pathology, MRI of the brain was done which showed 1.3 cm acute ischemic nonhemorrhagic right basal ganglia infarct.  MRI of cervical spine was done which showed diffuse multilevel spinal stenosis between C3-C4 and C6-C7. Hospital service was consulted for evaluation for admission for stroke workup.Neurology was consulted. She is on DAPT and Statin. Likely home on 9/30.  Assessment & Plan:  Principal Problem:   Acute CVA (cerebrovascular accident) (HCC) Active Problems:   Monoclonal gammopathy   B12 deficiency   Macrocytic anemia   Multiple myeloma not having achieved remission (HCC)   Asthma, intermittent, uncomplicated   Unsteady gait    78 year old female/PMH of multiple myeloma on chemotherapy, B12 deficiency, asthma, history of unsteady gait, macrocytic anemia, who came into the ED complaining of left-sided weakness.    Acute right basal ganglia ischemic stroke,POA: - Improving symptoms of left sided weakness - MRI of the brain showed right basal ganglia infarction. - CT angio of head and neck showed no LVO. More advanced intracranial atherosclerosis. Calcified ICA siphon plaque with up to moderate supraclinoid right ICA stenosis. And short segment moderate to severe Left PCA P2 segment stenosis.  - Neurology on board. - PT/OT/ST evaluations - ASA 81mg  daily + plavix 75mg  daily x90 days f/b plavix 75mg  daily monotherapy after that (90 days 2/2 severe intracranial stenosis)  -Continue with lipitor -f/u TTE-Unremarkable.    Multiple myeloma - Stable - Takes oral lenalidomide  daily  - Follow-up with oncologist as outpatient   Multilevel cervical spine stenosis -  Will continue to monitor - Outpatient follow up with spine surgery  Disposition: Home.  DVT prophylaxis: enoxaparin  (LOVENOX ) injection 40 mg Start: 01/29/24 0800     Code Status: Full Code Family Communication:  None at the bedside Status is: Observation The patient remains OBS appropriate and will d/c before 2 midnights.    Subjective:  She did complain of episodes of nausea but it does resolve with Zofran .  She said that she has a prescription of Zofran  at home.  She was given a prescription for 60 tablets of Zofran  by the cancer center.  She said that she did walk yesterday.  She wants to stay in the hospital until tomorrow at least to get more comfortable before she goes home.  She also mentioned to me that her sister is visiting her at the hospital tomorrow.  Examination:  General exam: Appears calm and comfortable  Respiratory system: Clear to auscultation. Respiratory effort normal. Cardiovascular system: S1 & S2 heard, RRR. No JVD, murmurs, rubs, gallops or clicks. No pedal edema. Gastrointestinal system: Abdomen is nondistended, soft and nontender. No organomegaly or masses felt. Normal bowel sounds heard. Central nervous system: Alert and oriented. No focal neurological deficits. Extremities: Power 5/5 on the right side and 4/5 on the left Skin: No rashes, lesions or ulcers Psychiatry: Judgement and insight appear normal. Mood & affect appropriate.      Diet Orders (From admission, onward)     Start     Ordered   01/29/24 0846  DIET DYS 3 Room service appropriate? Yes; Fluid consistency: Thin  Diet effective now       Question Answer Comment  Room service appropriate? Yes  Fluid consistency: Thin      01/29/24 0845            Objective: Vitals:   01/30/24 1937 01/30/24 2342 01/31/24 0329 01/31/24 0740  BP: 126/67 126/66 (!) 144/81 135/63  Pulse: 78 72 90 81  Resp:    16  Temp: 97.9 F (36.6 C) (!) 97.5 F (36.4 C) (!) 97.4 F (36.3 C) 97.7 F  (36.5 C)  TempSrc: Oral Oral Oral   SpO2: 100% 99% 100% 98%  Weight:      Height:        Intake/Output Summary (Last 24 hours) at 01/31/2024 0824 Last data filed at 01/30/2024 1900 Gross per 24 hour  Intake 480 ml  Output --  Net 480 ml   Filed Weights   01/28/24 2051  Weight: 65.8 kg    Scheduled Meds:  aspirin  81 mg Oral Daily   atorvastatin   20 mg Oral Daily   busPIRone   10 mg Oral BID   cyanocobalamin   1,000 mcg Oral Daily   enoxaparin  (LOVENOX ) injection  40 mg Subcutaneous Q24H   lenalidomide   10 mg Oral Daily   pentoxifylline  400 mg Oral BID   sertraline   100 mg Oral BID   traZODone   50 mg Oral QHS   Continuous Infusions:  Nutritional status     Body mass index is 25.69 kg/m.  Data Reviewed:   CBC: Recent Labs  Lab 01/28/24 2054  WBC 4.9  NEUTROABS 2.7  HGB 13.8  HCT 40.8  MCV 96.0  PLT 161   Basic Metabolic Panel: Recent Labs  Lab 01/28/24 2054  NA 139  K 4.1  CL 104  CO2 25  GLUCOSE 97  BUN 21  CREATININE 1.00  CALCIUM  8.8*   GFR: Estimated Creatinine Clearance: 43.7 mL/min (by C-G formula based on SCr of 1 mg/dL). Liver Function Tests: Recent Labs  Lab 01/28/24 2054  AST 24  ALT 25  ALKPHOS 96  BILITOT 0.5  PROT 6.8  ALBUMIN 4.0   No results for input(s): LIPASE, AMYLASE in the last 168 hours. No results for input(s): AMMONIA in the last 168 hours. Coagulation Profile: Recent Labs  Lab 01/28/24 2054  INR 1.0   Cardiac Enzymes: No results for input(s): CKTOTAL, CKMB, CKMBINDEX, TROPONINI in the last 168 hours. BNP (last 3 results) No results for input(s): PROBNP in the last 8760 hours. HbA1C: Recent Labs    01/29/24 0531  HGBA1C 4.6*   CBG: Recent Labs  Lab 01/28/24 2101 01/30/24 1727  GLUCAP 92 92   Lipid Profile: Recent Labs    01/29/24 0531  CHOL 116  HDL 56  LDLCALC 47  TRIG 67  CHOLHDL 2.1   Thyroid  Function Tests: No results for input(s): TSH, T4TOTAL, FREET4,  T3FREE, THYROIDAB in the last 72 hours. Anemia Panel: No results for input(s): VITAMINB12, FOLATE, FERRITIN, TIBC, IRON , RETICCTPCT in the last 72 hours. Sepsis Labs: No results for input(s): PROCALCITON, LATICACIDVEN in the last 168 hours.  No results found for this or any previous visit (from the past 240 hours).       Radiology Studies: ECHOCARDIOGRAM COMPLETE Result Date: 01/29/2024    ECHOCARDIOGRAM REPORT   Patient Name:   Laurie [name] Date of Exam: 01/29/2024 Medical Rec #:  [id number]       Height:       63.0 in Accession #:    [id number]      Weight:       145.0 lb Date of  Birth:  15-Apr-1946       BSA:          1.687 m Patient Age:    78 years        BP:           138/61 mmHg Patient Gender: F               HR:           77 bpm. Exam Location:  [facility] Procedure: 2D Echo, Cardiac Doppler and Color Doppler (Both Spectral and Color            Flow Doppler were utilized during procedure). Indications:     Stroke I63.9  History:         Patient has no prior history of Echocardiogram examinations.  Sonographer:     Bari Roar Referring Phys:  [id number] DELAYNE LULLA SOLIAN Diagnosing Phys: Redell Cave MD IMPRESSIONS  1. Left ventricular ejection fraction, by estimation, is 55 to 60%. The left ventricle has normal function. The left ventricle has no regional wall motion abnormalities. There is mild left ventricular hypertrophy. Left ventricular diastolic parameters are consistent with Grade I diastolic dysfunction (impaired relaxation).  2. Right ventricular systolic function is normal. The right ventricular size is normal.  3. The mitral valve is degenerative. Mild mitral valve regurgitation.  4. The aortic valve is tricuspid. Aortic valve regurgitation is trivial. FINDINGS  Left Ventricle: Left ventricular ejection fraction, by estimation, is 55 to 60%. The left ventricle has normal function. The left ventricle has no regional wall motion abnormalities. The left ventricular  internal cavity size was normal in size. There is  mild left ventricular hypertrophy. Left ventricular diastolic parameters are consistent with Grade I diastolic dysfunction (impaired relaxation). Right Ventricle: The right ventricular size is normal. No increase in right ventricular wall thickness. Right ventricular systolic function is normal. Left Atrium: Left atrial size was normal in size. Right Atrium: Right atrial size was normal in size. Pericardium: There is no evidence of pericardial effusion. Mitral Valve: The mitral valve is degenerative in appearance. Mild to moderate mitral annular calcification. Mild mitral valve regurgitation. MV peak gradient, 6.2 mmHg. The mean mitral valve gradient is 2.0 mmHg. Tricuspid Valve: The tricuspid valve is normal in structure. Tricuspid valve regurgitation is trivial. Aortic Valve: The aortic valve is tricuspid. Aortic valve regurgitation is trivial. Aortic regurgitation PHT measures 773 msec. Aortic valve mean gradient measures 3.0 mmHg. Aortic valve peak gradient measures 5.9 mmHg. Aortic valve area, by VTI measures  1.91 cm. Pulmonic Valve: The pulmonic valve was not well visualized. Pulmonic valve regurgitation is not visualized. Aorta: The aortic root and ascending aorta are structurally normal, with no evidence of dilitation. IAS/Shunts: No atrial level shunt detected by color flow Doppler.  LEFT VENTRICLE PLAX 2D LVIDd:         3.70 cm     Diastology LVIDs:         2.70 cm     LV e' medial:    5.87 cm/s LV PW:         1.20 cm     LV E/e' medial:  13.7 LV IVS:        1.30 cm     LV e' lateral:   7.07 cm/s LVOT diam:     1.70 cm     LV E/e' lateral: 11.3 LV SV:         44 LV SV Index:   26 LVOT Area:  2.27 cm  LV Volumes (MOD) LV vol d, MOD A2C: 86.0 ml LV vol d, MOD A4C: 75.1 ml LV vol s, MOD A2C: 40.5 ml LV vol s, MOD A4C: 36.4 ml LV SV MOD A2C:     45.5 ml LV SV MOD A4C:     75.1 ml LV SV MOD BP:      44.2 ml RIGHT VENTRICLE RV Basal diam:  2.60 cm RV Mid  diam:    2.40 cm RV S prime:     9.36 cm/s TAPSE (M-mode): 1.9 cm LEFT ATRIUM             Index        RIGHT ATRIUM          Index LA diam:        3.70 cm 2.19 cm/m   RA Area:     8.11 cm LA Vol (A2C):   48.4 ml 28.70 ml/m  RA Volume:   12.40 ml 7.35 ml/m LA Vol (A4C):   48.3 ml 28.64 ml/m LA Biplane Vol: 49.6 ml 29.41 ml/m  AORTIC VALVE                    PULMONIC VALVE AV Area (Vmax):    1.86 cm     PV Vmax:          0.88 m/s AV Area (Vmean):   1.92 cm     PV Peak grad:     3.1 mmHg AV Area (VTI):     1.91 cm     PR End Diast Vel: 3.34 msec AV Vmax:           121.00 cm/s  RVOT Peak grad:   2 mmHg AV Vmean:          72.100 cm/s AV VTI:            0.230 m AV Peak Grad:      5.9 mmHg AV Mean Grad:      3.0 mmHg LVOT Vmax:         99.00 cm/s LVOT Vmean:        60.900 cm/s LVOT VTI:          0.194 m LVOT/AV VTI ratio: 0.84 AI PHT:            773 msec  AORTA Ao Root diam: 2.40 cm Ao Asc diam:  3.30 cm MITRAL VALVE                TRICUSPID VALVE MV Area (PHT): 6.37 cm     TR Peak grad:   18.5 mmHg MV Area VTI:   1.69 cm     TR Vmax:        215.00 cm/s MV Peak grad:  6.2 mmHg MV Mean grad:  2.0 mmHg     SHUNTS MV Vmax:       1.24 m/s     Systemic VTI:  0.19 m MV Vmean:      71.7 cm/s    Systemic Diam: 1.70 cm MV Decel Time: 119 msec MV E velocity: 80.20 cm/s MV A velocity: 121.00 cm/s MV E/A ratio:  0.66 MV A Prime:    9.4 cm/s Redell Cave MD Electronically signed by Redell Cave MD Signature Date/Time: 01/29/2024/5:06:57 PM    Final    CT ANGIO HEAD NECK W WO CM Result Date: 01/29/2024 CLINICAL DATA:  78 year old female with neurologic deficit. Subtle right internal capsule lacunar infarcts suspected on MRI earlier today. EXAM: CT  ANGIOGRAPHY HEAD AND NECK WITH AND WITHOUT CONTRAST TECHNIQUE: Multidetector CT imaging of the head and neck was performed using the standard protocol during bolus administration of intravenous contrast. Multiplanar CT image reconstructions and MIPs were obtained to  evaluate the vascular anatomy. Carotid stenosis measurements (when applicable) are obtained utilizing NASCET criteria, using the distal internal carotid diameter as the denominator. RADIATION DOSE REDUCTION: This exam was performed according to the departmental dose-optimization program which includes automated exposure control, adjustment of the mA and/or kV according to patient size and/or use of iterative reconstruction technique. CONTRAST:  75mL OMNIPAQUE  IOHEXOL  350 MG/ML SOLN COMPARISON:  Brain MRI 0153 hours today. Head CT yesterday. Neck CT 03/30/2021. FINDINGS: CT HEAD Brain: Stable non contrast CT appearance of the brain. Scattered bilateral white matter hypodensity, no CT correlation in the right internal capsule. No acute intracranial hemorrhage or mass effect. Calvarium and skull base: Stable visualized osseous structures. Paranasal sinuses: Visualized paranasal sinuses and mastoids are stable and well aerated. Orbits: Stable orbit and scalp soft tissues. CTA NECK Skeleton: Previous mandible ORIF. Bone mineralization in the spine is within normal limits. Stable visible cervical and upper thoracic vertebrae from the 2022 CT (mild chronic upper thoracic superior endplate compression. Upper chest: Negative. Other neck: New or increased since 2022 round and mildly lobulated hypodense right thyroid  nodule is 13 mm (no follow-up imaging recommended). Other nonvascular soft tissue spaces in the neck appear negative. Aortic arch: 3 vessel arch is mildly tortuous. Mild Calcified aortic atherosclerosis. Right carotid system: Proximal brachiocephalic artery calcified plaque without stenosis. Negative right CCA origin. Mild tortuosity. Right ICA origin calcified plaque without stenosis. Left carotid system: Tortuosity. Minimal plaque at the left ICA origin. No stenosis. Vertebral arteries: Calcified plaque and tortuosity of the right subclavian artery origin. Less than 50 % stenosis with respect to the distal  vessel. Normal right vertebral artery origin. Right vertebral artery appears mildly dominant, widely patent to the skull base with no significant plaque, no stenosis. Proximal left subclavian artery mild calcified plaque without stenosis. Mild calcified plaque at the left vertebral origin without stenosis. Tortuous left V1 segment. Non dominant but fairly normal size left vertebral artery is patent to the skull base with no plaque or stenosis. Proximal left subclavian artery mild calcified plaque without stenosis. CTA HEAD Posterior circulation: Distal vertebral arteries, PICA origins and vertebrobasilar junction are patent with no plaque or stenosis. Dominant right V4 segment. Patent basilar artery without stenosis. Normal SCA and PCA origins. Tortuous right P1 segment. Small right posterior communicating artery, the left is diminutive or absent. Right PCA branches are within normal limits. Left P2 segment 4-5 mm moderate to severe irregularity and stenosis (series 14, image 21 and series 16, image 22) with preserved distal enhancement. Anterior circulation: Both ICA siphons are patent. Left siphon calcified plaque is moderate in the supraclinoid segment, only mild stenosis results. Right siphon similar moderate calcified plaque in the distal cavernous and supraclinoid segment. Moderate supraclinoid right ICA stenosis on series 13, image 82. Normal right posterior communicating artery origin. Patent carotid termini. Normal MCA and ACA origins. Normal anterior communicating artery. Bilateral ACA branches are within normal limits. Left MCA M1 segment and bifurcation are patent without stenosis. Right MCA M1 segment and bifurcation are patent without stenosis. Bilateral MCA branches are within normal limits. Venous sinuses: Patent. Anatomic variants: Dominant right vertebral artery. Review of the MIP images confirms the above findings IMPRESSION: 1. Negative for large vessel occlusion. 2. Age mild for age extracranial  atherosclerosis, no  extracranial stenosis. More advanced intracranial atherosclerosis. Calcified ICA siphon plaque with up to moderate supraclinoid right ICA stenosis. And short segment moderate to severe Left PCA P2 segment stenosis. 3.  Stable non contrast CT appearance of the brain. 4.  Aortic Atherosclerosis (ICD10-I70.0). Electronically Signed   By: VEAR Hurst M.D.   On: 01/29/2024 10:53        LOS: 0 days   Time spent= 40 mins    Deliliah Room, MD [facility]  If 7PM-7AM, please contact night-coverage  01/31/2024, 8:24 AM

## 2024-01-31 NOTE — Care Management Obs Status (Signed)
 MEDICARE OBSERVATION STATUS NOTIFICATION   Patient Details  Name: Laurie [name] MRN: [id number] Date of Birth: Mar 20, 1946   Medicare Observation Status Notification Given:  Yes    Kalynn Declercq W, CMA 01/31/2024, 11:49 AM

## 2024-01-31 NOTE — Progress Notes (Signed)
 The patient's IV to her Left hand came out. Very little bleeding noted. IV consult put in to start a new IV site.

## 2024-01-31 NOTE — Plan of Care (Signed)

## 2024-01-31 NOTE — Progress Notes (Signed)
 Physical Therapy Treatment Patient Details Name: Laurie [name] MRN: [id number] DOB: [birth year] Today's Date: 01/31/2024   History of Present Illness Pt is a pleasant 78 y.o. female with medical history significant for multiple myeloma on chemotherapy, asthma, osteoporosis who presented to [facility] ED for left-sided weakness. MRI of the brain revealed an acute right basal ganglia ischemic stroke.    PT Comments  Patient is agreeable to PT session and eager to walk. Increased ambulation distance this session. Patient walked 2 laps in the hallway using her own rollator with CGA for safety. Improved gait pattern with increased ambulation distance. Patient reports left leg fatigue with activity. Cues provided for safety with mobility and tips for fall prevention in home setting. Patient hopeful for discharge home soon. PT will continue to follow to maximize independence and facilitate return to prior level of function.    If plan is discharge home, recommend the following: A little help with walking and/or transfers;A little help with bathing/dressing/bathroom;Assistance with cooking/housework;Assist for transportation;Help with stairs or ramp for entrance   Can travel by private vehicle        Equipment Recommendations  None recommended by PT    Recommendations for Other Services       Precautions / Restrictions Precautions Precautions: Fall Recall of Precautions/Restrictions: Intact Restrictions Weight Bearing Restrictions Per Provider Order: No     Mobility  Bed Mobility Overal bed mobility: Modified Independent             General bed mobility comments: increased time    Transfers Overall transfer level: Needs assistance Equipment used: Rollator (4 wheels) Transfers: Sit to/from Stand Sit to Stand: Contact guard assist           General transfer comment: cues for safety with hand placement. education to use assistive device for safety until sitting     Ambulation/Gait Ambulation/Gait assistance: Contact guard assist Gait Distance (Feet): 280 Feet Assistive device: Rollator (4 wheels) Gait Pattern/deviations: Step-through pattern Gait velocity: decreased     General Gait Details: improved gait pattern and gait speed with increased ambulation distance. encouraged patient to monitor for fatigue and need for rest breaks for fall prevention in home setting   Stairs             Wheelchair Mobility     Tilt Bed    Modified Rankin (Stroke Patients Only)       Balance Overall balance assessment: Needs assistance Sitting-balance support: No upper extremity supported, Feet supported Sitting balance-Leahy Scale: Good     Standing balance support: Bilateral upper extremity supported Standing balance-Leahy Scale: Poor Standing balance comment: with UE support on rollator                            Communication Communication Communication: No apparent difficulties  Cognition Arousal: Alert Behavior During Therapy: WFL for tasks assessed/performed   PT - Cognitive impairments: Safety/Judgement                       PT - Cognition Comments: cues for attention to task as times Following commands: Intact      Cueing Cueing Techniques: Verbal cues  Exercises      General Comments        Pertinent Vitals/Pain Pain Assessment Pain Assessment: No/denies pain    Home Living  Prior Function            PT Goals (current goals can now be found in the care plan section) Acute Rehab PT Goals Patient Stated Goal: to return home PT Goal Formulation: With patient Time For Goal Achievement: 02/13/24 Potential to Achieve Goals: Good Progress towards PT goals: Progressing toward goals    Frequency    Min 3X/week      PT Plan      Co-evaluation              AM-PAC PT 6 Clicks Mobility   Outcome Measure  Help needed turning from your back to  your side while in a flat bed without using bedrails?: None Help needed moving from lying on your back to sitting on the side of a flat bed without using bedrails?: A Little Help needed moving to and from a bed to a chair (including a wheelchair)?: A Little Help needed standing up from a chair using your arms (e.g., wheelchair or bedside chair)?: A Little Help needed to walk in hospital room?: A Little Help needed climbing 3-5 steps with a railing? : A Little 6 Click Score: 19    End of Session   Activity Tolerance: Patient tolerated treatment well Patient left: with call bell/phone within reach;with chair alarm set;in chair Nurse Communication: Mobility status PT Visit Diagnosis: Other abnormalities of gait and mobility (R26.89)     Time: 8661-8594 PT Time Calculation (min) (ACUTE ONLY): 27 min  Charges:    $Gait Training: 8-22 mins $Therapeutic Activity: 8-22 mins PT General Charges $$ ACUTE PT VISIT: 1 Visit                     Randine Essex, PT, MPT    Randine LULLA Essex 01/31/2024, 2:26 PM

## 2024-01-31 NOTE — Progress Notes (Signed)
 Occupational Therapy Treatment Patient Details Name: Laurie [name] MRN: [id number] DOB: 08/09/1945 Today's Date: 01/31/2024   History of present illness Pt is a pleasant 78 y.o. female with medical history significant for multiple myeloma on chemotherapy, asthma, osteoporosis who presented to [facility] ED for left-sided weakness. MRI of the brain revealed an acute right basal ganglia ischemic stroke.   OT comments  Pt seen for OT treatment on this date. Upon arrival to room pt seated in recliner, agreeable to tx. Pt reports feeling nauseas on the time of arrival, eager to return to bed. Pt STS from recliner with CGA, good carryover of learned safety techniques when navigating with rollator. Pt completed bed mobility with MODI, endorsing she is able to get into bed with more ease that the previous day. Pt reports her sensation in improving but her double vision seems to be getting worse. Pt educated on the benefits of HHOT to prolong her independence and ability to safely live alone. Pt is in agreement to [facility] services and excited to return home. Pt making good progress toward goals, will continue to follow POC. Discharge recommendation remains appropriate.        If plan is discharge home, recommend the following:  Direct supervision/assist for medications management;Direct supervision/assist for financial management;Help with stairs or ramp for entrance;Assistance with cooking/housework;A little help with bathing/dressing/bathroom;A little help with walking and/or transfers   Equipment Recommendations  None recommended by OT    Recommendations for Other Services      Precautions / Restrictions Precautions Precautions: Fall Recall of Precautions/Restrictions: Intact Restrictions Weight Bearing Restrictions Per Provider Order: No       Mobility Bed Mobility Overal bed mobility: Modified Independent, Needs Assistance             General bed mobility comments: increased time to bring  BLE onto bed, no physical assistance to complete    Transfers Overall transfer level: Needs assistance Equipment used: Rollator (4 wheels) Transfers: Sit to/from Stand Sit to Stand: Contact guard assist           General transfer comment: Good carry over of learned technique for safety awareness and hand placement     Balance Overall balance assessment: Needs assistance Sitting-balance support: No upper extremity supported, Feet supported Sitting balance-Leahy Scale: Good Sitting balance - Comments: Steady reaching within BOS   Standing balance support: Bilateral upper extremity supported Standing balance-Leahy Scale: Poor Standing balance comment: with UE support on rollator                           ADL either performed or assessed with clinical judgement   ADL Overall ADL's : Needs assistance/impaired Eating/Feeding: Sitting;Set up                   Lower Body Dressing: Supervision/safety;Bed level   Toilet Transfer: Ambulation;Rollator (4 wheels);Contact guard assist Toilet Transfer Details (indicate cue type and reason): Simulated toilet t/f         Functional mobility during ADLs: Contact guard assist;Rolling walker (2 wheels)       Communication Communication Communication: No apparent difficulties   Cognition Arousal: Alert Behavior During Therapy: WFL for tasks assessed/performed Cognition: No family/caregiver present to determine baseline             OT - Cognition Comments: A/Ox4                 Following commands: Intact  Cueing   Cueing Techniques: Verbal cues  Exercises Exercises: Other exercises Other Exercises Other Exercises: Edu: Role of HHOT, beneifits of [facility]HC    Shoulder Instructions         Frequency  Min 3X/week        Progress Toward Goals  OT Goals(current goals can now be found in the care plan section)  Progress towards OT goals: Progressing toward goals  Acute Rehab OT  Goals OT Goal Formulation: With patient Time For Goal Achievement: 02/12/24 Potential to Achieve Goals: Good ADL Goals Pt Will Perform Grooming: with modified independence;standing Pt Will Perform Lower Body Dressing: with modified independence;sitting/lateral leans Pt Will Transfer to Toilet: with modified independence;ambulating Pt Will Perform Toileting - Clothing Manipulation and hygiene: with modified independence;sit to/from stand   AM-PAC OT 6 Clicks Daily Activity     Outcome Measure   Help from another person eating meals?: A Little Help from another person taking care of personal grooming?: A Little Help from another person toileting, which includes using toliet, bedpan, or urinal?: A Little Help from another person bathing (including washing, rinsing, drying)?: A Little Help from another person to put on and taking off regular upper body clothing?: A Little Help from another person to put on and taking off regular lower body clothing?: A Little 6 Click Score: 18    End of Session Equipment Utilized During Treatment: Rollator (4 wheels)  OT Visit Diagnosis: Unsteadiness on feet (R26.81);Other abnormalities of gait and mobility (R26.89);Muscle weakness (generalized) (M62.81) Hemiplegia - Right/Left: Left Hemiplegia - dominant/non-dominant: Dominant Hemiplegia - caused by: Cerebral infarction   Activity Tolerance Patient tolerated treatment well   Patient Left in bed;with call bell/phone within reach;with family/visitor present   Nurse Communication Mobility status        Time: 8398-8387 OT Time Calculation (min): 11 min  Charges: OT General Charges $OT Visit: 1 Visit OT Treatments $Self Care/Home Management : 8-22 mins  Larraine Colas M.S. OTR/L  01/31/24, 4:44 PM

## 2024-01-31 NOTE — TOC Progression Note (Signed)
 Transition of Care [facility]) - Progression Note    Patient Details  Name: Laurie [name] MRN: [id number] Date of Birth: [birth year]  [facility] CM/SW Contact  Lauraine JAYSON Carpen, LCSW Phone Number: 01/31/2024, 12:25 PM  Clinical Narrative:   CSW met with patient. She confirmed plan to return home at discharge. CSW notified her that home health had been set up with [facility]. Liaison is aware that patient will likely discharge tomorrow. Patient asked about getting a ramp. CSW added resources to AVS. Patient will start calling around to see who can pick her up at discharge.  Expected Discharge Plan and Services                                               Social Drivers of Health (SDOH) Interventions SDOH Screenings   Food Insecurity: No Food Insecurity (01/29/2024)  Housing: Low Risk  (01/29/2024)  Transportation Needs: No Transportation Needs (01/29/2024)  Utilities: Not At Risk (01/29/2024)  Financial Resource Strain: Low Risk  (08/12/2022)   Received from [facility]  Social Connections: Moderately Integrated (01/29/2024)  Tobacco Use: Low Risk  (01/28/2024)    Readmission Risk Interventions     No data to display

## 2024-01-31 NOTE — Discharge Instructions (Addendum)
Ramp Resources   [place] [facility] of [facility]ational [facility] Services - Independent Living Services For permanent ramp [employer] office: [phone] Will have to obtain documentation to confirm medical and financial eligibility. Not a quick process. Can take up to a year to complete.  [employer] For temporary ramp Call [phone]

## 2024-02-01 DIAGNOSIS — I639 Cerebral infarction, unspecified: Secondary | ICD-10-CM | POA: Diagnosis not present

## 2024-02-01 NOTE — Plan of Care (Signed)

## 2024-02-01 NOTE — Plan of Care (Signed)
  Problem: Education: Goal: Knowledge of disease or condition will improve Outcome: Progressing   Problem: Ischemic Stroke/TIA Tissue Perfusion: Goal: Complications of ischemic stroke/TIA will be minimized Outcome: Progressing   Problem: Coping: Goal: Will verbalize positive feelings about self Outcome: Progressing Goal: Will identify appropriate support needs Outcome: Progressing   Problem: Nutrition: Goal: Risk of aspiration will decrease Outcome: Progressing Goal: Dietary intake will improve Outcome: Progressing

## 2024-02-01 NOTE — Progress Notes (Signed)
 PROGRESS NOTE    DAO MEARNS  FMW:[id number] DOB: 1946-03-30 DOA: 01/29/2024 PCP: Tobie Domino, MD   Brief Narrative:   78 y.o. Laurie [name] with medical history significant for multiple myeloma on chemotherapy, asthma, osteoporosis who presented to [facility] ED for left-sided weakness 2 days ago.  CT brain showed no acute pathology, MRI of the brain was done which showed 1.3 cm acute ischemic nonhemorrhagic right basal ganglia infarct.  MRI of cervical spine was done which showed diffuse multilevel spinal stenosis between C3-C4 and C6-C7. Hospital service was consulted for evaluation for admission for stroke workup.Neurology was consulted. She is on DAPT and Statin. She has been unsteady at times and doesn't feel comfortable going home. She needs SNF placement.  Assessment & Plan:  Principal Problem:   Acute CVA (cerebrovascular accident) (HCC) Active Problems:   Monoclonal gammopathy   B12 deficiency   Macrocytic anemia   Multiple myeloma not having achieved remission (HCC)   Asthma, intermittent, uncomplicated   Unsteady gait    78 year old Laurie [name]/PMH of multiple myeloma on chemotherapy, B12 deficiency, asthma, history of unsteady gait, macrocytic anemia, who came into the ED complaining of left-sided weakness.    Acute right basal ganglia ischemic stroke,POA: - Improving symptoms of left sided weakness - MRI of the brain showed right basal ganglia infarction. - CT angio of head and neck showed no LVO. More advanced intracranial atherosclerosis. Calcified ICA siphon plaque with up to moderate supraclinoid right ICA stenosis. And short segment moderate to severe Left PCA P2 segment stenosis.  - Neurology on board. - PT/OT/ST evaluations - ASA 81mg  daily + plavix 75mg  daily x90 days f/b plavix 75mg  daily monotherapy after that (90 days 2/2 severe intracranial stenosis)  -Continue with lipitor -f/u TTE-Unremarkable.    Multiple myeloma - Stable - Takes oral lenalidomide  daily  -  Follow-up with oncologist as outpatient   Multilevel cervical spine stenosis - Will continue to monitor - Outpatient follow up with spine surgery  Disposition: She lives at Home by herself. She doesn't feel comfortable going home. She will likely need SNF placement. TOC on board.  DVT prophylaxis: enoxaparin  (LOVENOX ) injection 40 mg Start: 01/29/24 0800     Code Status: Full Code Family Communication:  None at the bedside Status is: Observation    Subjective:  She was unsteady this morning when she walked to the bathroom .  She told me that she does have some baseline unsteadiness but that has gotten worse now after the stroke.  She does not feel comfortable going home.  We did discuss about rehab placement and she is agreeable to that.  Examination:  General exam: Appears calm and comfortable  Respiratory system: Clear to auscultation. Respiratory effort normal. Cardiovascular system: S1 & S2 heard, RRR. No JVD, murmurs, rubs, gallops or clicks. No pedal edema. Gastrointestinal system: Abdomen is nondistended, soft and nontender. No organomegaly or masses felt. Normal bowel sounds heard. Central nervous system: Alert and oriented. No focal neurological deficits. Extremities: Power 5/5 on the right side and 4/5 on the left Skin: No rashes, lesions or ulcers Psychiatry: Judgement and insight appear normal. Mood & affect appropriate.      Diet Orders (From admission, onward)     Start     Ordered   01/29/24 0846  DIET DYS 3 Room service appropriate? Yes; Fluid consistency: Thin  Diet effective now       Question Answer Comment  Room service appropriate? Yes   Fluid consistency: Thin  01/29/24 0845            Objective: Vitals:   01/31/24 1204 01/31/24 2001 02/01/24 0350 02/01/24 0832  BP: (!) 123/111 (!) 116/102 120/70 133/69  Pulse: 79 84 74 84  Resp: 14 16 18 19   Temp: 97.9 F (36.6 C) 97.7 F (36.5 C) 97.7 F (36.5 C) (!) 97.5 F (36.4 C)  TempSrc:  Axillary Axillary Axillary Oral  SpO2: 99% 97% 99% 99%  Weight:      Height:        Intake/Output Summary (Last 24 hours) at 02/01/2024 0915 Last data filed at 01/31/2024 1300 Gross per 24 hour  Intake 240 ml  Output --  Net 240 ml   Filed Weights   01/28/24 2051  Weight: 65.8 kg    Scheduled Meds:  aspirin  81 mg Oral Daily   atorvastatin   20 mg Oral Daily   busPIRone   10 mg Oral BID   cyanocobalamin   1,000 mcg Oral Daily   enoxaparin  (LOVENOX ) injection  40 mg Subcutaneous Q24H   lenalidomide   10 mg Oral Daily   pentoxifylline  400 mg Oral BID   sertraline   100 mg Oral BID   traZODone   50 mg Oral QHS   Continuous Infusions:  Nutritional status     Body mass index is 25.69 kg/m.  Data Reviewed:   CBC: Recent Labs  Lab 01/28/24 2054  WBC 4.9  NEUTROABS 2.7  HGB 13.8  HCT 40.8  MCV 96.0  PLT 161   Basic Metabolic Panel: Recent Labs  Lab 01/28/24 2054  NA 139  K 4.1  CL 104  CO2 25  GLUCOSE 97  BUN 21  CREATININE 1.00  CALCIUM  8.8*   GFR: Estimated Creatinine Clearance: 43.7 mL/min (by C-G formula based on SCr of 1 mg/dL). Liver Function Tests: Recent Labs  Lab 01/28/24 2054  AST 24  ALT 25  ALKPHOS 96  BILITOT 0.5  PROT 6.8  ALBUMIN 4.0   No results for input(s): LIPASE, AMYLASE in the last 168 hours. No results for input(s): AMMONIA in the last 168 hours. Coagulation Profile: Recent Labs  Lab 01/28/24 2054  INR 1.0   Cardiac Enzymes: No results for input(s): CKTOTAL, CKMB, CKMBINDEX, TROPONINI in the last 168 hours. BNP (last 3 results) No results for input(s): PROBNP in the last 8760 hours. HbA1C: No results for input(s): HGBA1C in the last 72 hours.  CBG: Recent Labs  Lab 01/28/24 2101 01/30/24 1727  GLUCAP 92 92   Lipid Profile: No results for input(s): CHOL, HDL, LDLCALC, TRIG, CHOLHDL, LDLDIRECT in the last 72 hours.  Thyroid  Function Tests: No results for input(s): TSH, T4TOTAL,  FREET4, T3FREE, THYROIDAB in the last 72 hours. Anemia Panel: No results for input(s): VITAMINB12, FOLATE, FERRITIN, TIBC, IRON , RETICCTPCT in the last 72 hours. Sepsis Labs: No results for input(s): PROCALCITON, LATICACIDVEN in the last 168 hours.  No results found for this or any previous visit (from the past 240 hours).       Radiology Studies: No results found.       LOS: 0 days   Time spent= 40 mins    Deliliah Room, MD [facility]  If 7PM-7AM, please contact night-coverage  02/01/2024, 9:15 AM

## 2024-02-02 DIAGNOSIS — I639 Cerebral infarction, unspecified: Secondary | ICD-10-CM | POA: Diagnosis not present

## 2024-02-02 MED ORDER — CLOPIDOGREL BISULFATE 75 MG PO TABS: 75.0000 mg | ORAL_TABLET | Freq: Every day | ORAL | 3 refills | Status: AC

## 2024-02-02 MED ORDER — ASPIRIN 81 MG PO CHEW: 81.0000 mg | CHEWABLE_TABLET | Freq: Every day | ORAL | 0 refills | Status: AC

## 2024-02-02 MED ORDER — BUSPIRONE HCL 10 MG PO TABS: 10.0000 mg | ORAL_TABLET | Freq: Two times a day (BID) | ORAL | Status: AC

## 2024-02-02 MED ORDER — ATORVASTATIN CALCIUM 20 MG PO TABS: 20.0000 mg | ORAL_TABLET | Freq: Every day | ORAL | 0 refills | Status: AC

## 2024-02-02 NOTE — Discharge Summary (Signed)
 Physician Discharge Summary   Patient: Laurie [name] MRN: [id number] DOB: March 13, 1946  Admit date:     01/29/2024  Discharge date: 02/02/24  Discharge Physician: Carliss LELON Canales   PCP: Tobie Domino, MD   Recommendations at discharge:    Pt to be discharged home with home health.   If you experience worsening fever, chills, chest pain, shortness of breath, or other concerning symptoms, please call your PCP or go to the emergency department immediately.  Discharge Diagnoses: Principal Problem:   Acute CVA (cerebrovascular accident) (HCC) Active Problems:   Monoclonal gammopathy   B12 deficiency   Macrocytic anemia   Multiple myeloma not having achieved remission (HCC)   Asthma, intermittent, uncomplicated   Unsteady gait  Resolved Problems:   * No resolved hospital problems. *   Hospital Course:  78 y.o. female with medical history significant for multiple myeloma on chemotherapy, asthma, osteoporosis who presented to [facility] ED for left-sided weakness 2 days ago.  CT brain showed no acute pathology, MRI of the brain was done which showed 1.3 cm acute ischemic nonhemorrhagic right basal ganglia infarct.  MRI of cervical spine was done which showed diffuse multilevel spinal stenosis between C3-C4 and C6-C7. Hospital service was consulted for evaluation for admission for stroke workup.Neurology was consulted. She is on DAPT and Statin.   Assessment and Plan:  Acute right basal ganglia ischemic stroke - Improved left-sided weakness.  MRI noting right basal ganglia infarction.  CT angio head and neck showed no LVO, more advanced intracranial atherosclerosis. Calcified ICA siphon plaque with up to moderate supraclinoid right ICA stenosis. And short segment moderate to severe Left PCA P2 segment stenosis.  Evaluated by neurology, PT/OT/ST.  Recommendations to continue aspirin plus Plavix daily for 90 days then transition to Plavix monotherapy thereafter.  Continue atorvastatin .  Follow-up  with neurology outpatient setting.  Secondary to physical debilitation muscle weakness, recommending home health PT/OT.  Multiple myeloma - Takes oral lenalidomide  daily.  Follows with oncology outpatient setting.  Multilevel cervical spinal stenosis - Follow-up with outpatient spinal surgery.   Consultants: Neurology Procedures performed: None Disposition: Home health Diet recommendation:  Discharge Diet Orders (From admission, onward)     Start     Ordered   02/02/24 0000  Diet - low sodium heart healthy        02/02/24 1100           Cardiac diet  DISCHARGE MEDICATION: Allergies as of 02/02/2024       Reactions   Levofloxacin    Critical Other Reaction(s): Itching hives        Medication List     STOP taking these medications    chlorhexidine 0.12 % solution Commonly known as: PERIDEX       TAKE these medications    acetaminophen  325 MG tablet Commonly known as: TYLENOL  Take 650 mg by mouth every 8 (eight) hours as needed for mild pain (pain score 1-3).   acyclovir  400 MG tablet Commonly known as: ZOVIRAX  TAKE 1 TABLET BY MOUTH TWICE A DAY   albuterol  108 (90 Base) MCG/ACT inhaler Commonly known as: VENTOLIN  HFA 2 inhalations every 4 (four) hours as needed.   aspirin 81 MG chewable tablet Chew 1 tablet (81 mg total) by mouth daily. What changed: how much to take   atorvastatin  20 MG tablet Commonly known as: LIPITOR Take 1 tablet (20 mg total) by mouth daily.   busPIRone  10 MG tablet Commonly known as: BUSPAR  Take 1 tablet (10 mg total)  by mouth 2 (two) times daily. What changed: when to take this   CALCIUM  600 + D PO Take 600 mg by mouth 4 (four) times daily. Increase calcium  from 600mg  4 times a day to 600mg  5 times a day starting today   clopidogrel 75 MG tablet Commonly known as: Plavix Take 1 tablet (75 mg total) by mouth daily.   cyanocobalamin  1000 MCG tablet Commonly known as: VITAMIN B12 Take 1,000 mcg by mouth daily.    dexamethasone  4 MG tablet Commonly known as: DECADRON  Take 5 tablets (20 mg total) by mouth once a week.   docusate sodium 100 MG capsule Commonly known as: COLACE Take 100 mg by mouth 2 (two) times daily.   lenalidomide  10 MG capsule Commonly known as: REVLIMID  TAKE 1 CAPSULE BY MOUTH 1 TIME A DAY   montelukast  10 MG tablet Commonly known as: SINGULAIR  Take 10 mg by mouth daily after breakfast.   nortriptyline  50 MG capsule Commonly known as: PAMELOR  Take 50 mg by mouth at bedtime.   omeprazole  20 MG capsule Commonly known as: PRILOSEC Take 20 mg by mouth daily.   ondansetron  8 MG tablet Commonly known as: ZOFRAN  Take 1 tablet (8 mg total) by mouth every 8 (eight) hours as needed.   pentoxifylline 400 MG CR tablet Commonly known as: TRENTAL Take 400 mg by mouth 2 (two) times daily.   sertraline  100 MG tablet Commonly known as: ZOLOFT  Take 100 mg by mouth 2 (two) times daily.   traZODone  50 MG tablet Commonly known as: DESYREL  Take 50 mg by mouth at bedtime.   Vitamin D (Ergocalciferol) 1.25 MG (50000 UNIT) Caps capsule Commonly known as: DRISDOL Take 1,250 mcg by mouth.   vitamin E 1000 UNIT capsule Take 1,000 Units by mouth daily.        Follow-up Information     Tobie Domino, MD Follow up.   Specialty: Family Medicine Why: hospital follow up Contact information: [address] [place] [address] [phone]                 Discharge Exam: Laurie [name]   01/28/24 2051  Weight: 65.8 kg    GENERAL:  Alert, pleasant, no acute distress  HEENT:  EOMI CARDIOVASCULAR:  RRR, no murmurs appreciated RESPIRATORY:  Clear to auscultation, no wheezing, rales, or rhonchi GASTROINTESTINAL:  Soft, nontender, nondistended EXTREMITIES:  No LE edema bilaterally NEURO:  No new focal deficits appreciated SKIN:  No rashes noted PSYCH:  Appropriate mood and affect     Condition at discharge: improving  The results of significant  diagnostics from this hospitalization (including imaging, microbiology, ancillary and laboratory) are listed below for reference.   Imaging Studies: ECHOCARDIOGRAM COMPLETE Result Date: 01/29/2024    ECHOCARDIOGRAM REPORT   Patient Name:   Laurie [name] Date of Exam: 01/29/2024 Medical Rec #:  [id number]       Height:       63.0 in Accession #:    [id number]      Weight:       145.0 lb Date of Birth:  1945-09-21       BSA:          1.687 m Patient Age:    78 years        BP:           138/61 mmHg Patient Gender: F               HR:  77 bpm. Exam Location:  [facility] Procedure: 2D Echo, Cardiac Doppler and Color Doppler (Both Spectral and Color            Flow Doppler were utilized during procedure). Indications:     Stroke I63.9  History:         Patient has no prior history of Echocardiogram examinations.  Sonographer:     Bari Roar Referring Phys:  [id number] DELAYNE LULLA SOLIAN Diagnosing Phys: Redell Cave MD IMPRESSIONS  1. Left ventricular ejection fraction, by estimation, is 55 to 60%. The left ventricle has normal function. The left ventricle has no regional wall motion abnormalities. There is mild left ventricular hypertrophy. Left ventricular diastolic parameters are consistent with Grade I diastolic dysfunction (impaired relaxation).  2. Right ventricular systolic function is normal. The right ventricular size is normal.  3. The mitral valve is degenerative. Mild mitral valve regurgitation.  4. The aortic valve is tricuspid. Aortic valve regurgitation is trivial. FINDINGS  Left Ventricle: Left ventricular ejection fraction, by estimation, is 55 to 60%. The left ventricle has normal function. The left ventricle has no regional wall motion abnormalities. The left ventricular internal cavity size was normal in size. There is  mild left ventricular hypertrophy. Left ventricular diastolic parameters are consistent with Grade I diastolic dysfunction (impaired relaxation). Right Ventricle: The right  ventricular size is normal. No increase in right ventricular wall thickness. Right ventricular systolic function is normal. Left Atrium: Left atrial size was normal in size. Right Atrium: Right atrial size was normal in size. Pericardium: There is no evidence of pericardial effusion. Mitral Valve: The mitral valve is degenerative in appearance. Mild to moderate mitral annular calcification. Mild mitral valve regurgitation. MV peak gradient, 6.2 mmHg. The mean mitral valve gradient is 2.0 mmHg. Tricuspid Valve: The tricuspid valve is normal in structure. Tricuspid valve regurgitation is trivial. Aortic Valve: The aortic valve is tricuspid. Aortic valve regurgitation is trivial. Aortic regurgitation PHT measures 773 msec. Aortic valve mean gradient measures 3.0 mmHg. Aortic valve peak gradient measures 5.9 mmHg. Aortic valve area, by VTI measures  1.91 cm. Pulmonic Valve: The pulmonic valve was not well visualized. Pulmonic valve regurgitation is not visualized. Aorta: The aortic root and ascending aorta are structurally normal, with no evidence of dilitation. IAS/Shunts: No atrial level shunt detected by color flow Doppler.  LEFT VENTRICLE PLAX 2D LVIDd:         3.70 cm     Diastology LVIDs:         2.70 cm     LV e' medial:    5.87 cm/s LV PW:         1.20 cm     LV E/e' medial:  13.7 LV IVS:        1.30 cm     LV e' lateral:   7.07 cm/s LVOT diam:     1.70 cm     LV E/e' lateral: 11.3 LV SV:         44 LV SV Index:   26 LVOT Area:     2.27 cm  LV Volumes (MOD) LV vol d, MOD A2C: 86.0 ml LV vol d, MOD A4C: 75.1 ml LV vol s, MOD A2C: 40.5 ml LV vol s, MOD A4C: 36.4 ml LV SV MOD A2C:     45.5 ml LV SV MOD A4C:     75.1 ml LV SV MOD BP:      44.2 ml RIGHT VENTRICLE RV Basal diam:  2.60 cm RV Mid diam:  2.40 cm RV S prime:     9.36 cm/s TAPSE (M-mode): 1.9 cm LEFT ATRIUM             Index        RIGHT ATRIUM          Index LA diam:        3.70 cm 2.19 cm/m   RA Area:     8.11 cm LA Vol (A2C):   48.4 ml 28.70 ml/m   RA Volume:   12.40 ml 7.35 ml/m LA Vol (A4C):   48.3 ml 28.64 ml/m LA Biplane Vol: 49.6 ml 29.41 ml/m  AORTIC VALVE                    PULMONIC VALVE AV Area (Vmax):    1.86 cm     PV Vmax:          0.88 m/s AV Area (Vmean):   1.92 cm     PV Peak grad:     3.1 mmHg AV Area (VTI):     1.91 cm     PR End Diast Vel: 3.34 msec AV Vmax:           121.00 cm/s  RVOT Peak grad:   2 mmHg AV Vmean:          72.100 cm/s AV VTI:            0.230 m AV Peak Grad:      5.9 mmHg AV Mean Grad:      3.0 mmHg LVOT Vmax:         99.00 cm/s LVOT Vmean:        60.900 cm/s LVOT VTI:          0.194 m LVOT/AV VTI ratio: 0.84 AI PHT:            773 msec  AORTA Ao Root diam: 2.40 cm Ao Asc diam:  3.30 cm MITRAL VALVE                TRICUSPID VALVE MV Area (PHT): 6.37 cm     TR Peak grad:   18.5 mmHg MV Area VTI:   1.69 cm     TR Vmax:        215.00 cm/s MV Peak grad:  6.2 mmHg MV Mean grad:  2.0 mmHg     SHUNTS MV Vmax:       1.24 m/s     Systemic VTI:  0.19 m MV Vmean:      71.7 cm/s    Systemic Diam: 1.70 cm MV Decel Time: 119 msec MV E velocity: 80.20 cm/s MV A velocity: 121.00 cm/s MV E/A ratio:  0.66 MV A Prime:    9.4 cm/s Redell Cave MD Electronically signed by Redell Cave MD Signature Date/Time: 01/29/2024/5:06:57 PM    Final    CT ANGIO HEAD NECK W WO CM Result Date: 01/29/2024 CLINICAL DATA:  78 year old female with neurologic deficit. Subtle right internal capsule lacunar infarcts suspected on MRI earlier today. EXAM: CT ANGIOGRAPHY HEAD AND NECK WITH AND WITHOUT CONTRAST TECHNIQUE: Multidetector CT imaging of the head and neck was performed using the standard protocol during bolus administration of intravenous contrast. Multiplanar CT image reconstructions and MIPs were obtained to evaluate the vascular anatomy. Carotid stenosis measurements (when applicable) are obtained utilizing NASCET criteria, using the distal internal carotid diameter as the denominator. RADIATION DOSE REDUCTION: This exam was  performed according to the departmental dose-optimization program which includes automated exposure  control, adjustment of the mA and/or kV according to patient size and/or use of iterative reconstruction technique. CONTRAST:  75mL OMNIPAQUE  IOHEXOL  350 MG/ML SOLN COMPARISON:  Brain MRI 0153 hours today. Head CT yesterday. Neck CT 03/30/2021. FINDINGS: CT HEAD Brain: Stable non contrast CT appearance of the brain. Scattered bilateral white matter hypodensity, no CT correlation in the right internal capsule. No acute intracranial hemorrhage or mass effect. Calvarium and skull base: Stable visualized osseous structures. Paranasal sinuses: Visualized paranasal sinuses and mastoids are stable and well aerated. Orbits: Stable orbit and scalp soft tissues. CTA NECK Skeleton: Previous mandible ORIF. Bone mineralization in the spine is within normal limits. Stable visible cervical and upper thoracic vertebrae from the 2022 CT (mild chronic upper thoracic superior endplate compression. Upper chest: Negative. Other neck: New or increased since 2022 round and mildly lobulated hypodense right thyroid  nodule is 13 mm (no follow-up imaging recommended). Other nonvascular soft tissue spaces in the neck appear negative. Aortic arch: 3 vessel arch is mildly tortuous. Mild Calcified aortic atherosclerosis. Right carotid system: Proximal brachiocephalic artery calcified plaque without stenosis. Negative right CCA origin. Mild tortuosity. Right ICA origin calcified plaque without stenosis. Left carotid system: Tortuosity. Minimal plaque at the left ICA origin. No stenosis. Vertebral arteries: Calcified plaque and tortuosity of the right subclavian artery origin. Less than 50 % stenosis with respect to the distal vessel. Normal right vertebral artery origin. Right vertebral artery appears mildly dominant, widely patent to the skull base with no significant plaque, no stenosis. Proximal left subclavian artery mild calcified plaque  without stenosis. Mild calcified plaque at the left vertebral origin without stenosis. Tortuous left V1 segment. Non dominant but fairly normal size left vertebral artery is patent to the skull base with no plaque or stenosis. Proximal left subclavian artery mild calcified plaque without stenosis. CTA HEAD Posterior circulation: Distal vertebral arteries, PICA origins and vertebrobasilar junction are patent with no plaque or stenosis. Dominant right V4 segment. Patent basilar artery without stenosis. Normal SCA and PCA origins. Tortuous right P1 segment. Small right posterior communicating artery, the left is diminutive or absent. Right PCA branches are within normal limits. Left P2 segment 4-5 mm moderate to severe irregularity and stenosis (series 14, image 21 and series 16, image 22) with preserved distal enhancement. Anterior circulation: Both ICA siphons are patent. Left siphon calcified plaque is moderate in the supraclinoid segment, only mild stenosis results. Right siphon similar moderate calcified plaque in the distal cavernous and supraclinoid segment. Moderate supraclinoid right ICA stenosis on series 13, image 82. Normal right posterior communicating artery origin. Patent carotid termini. Normal MCA and ACA origins. Normal anterior communicating artery. Bilateral ACA branches are within normal limits. Left MCA M1 segment and bifurcation are patent without stenosis. Right MCA M1 segment and bifurcation are patent without stenosis. Bilateral MCA branches are within normal limits. Venous sinuses: Patent. Anatomic variants: Dominant right vertebral artery. Review of the MIP images confirms the above findings IMPRESSION: 1. Negative for large vessel occlusion. 2. Age mild for age extracranial atherosclerosis, no extracranial stenosis. More advanced intracranial atherosclerosis. Calcified ICA siphon plaque with up to moderate supraclinoid right ICA stenosis. And short segment moderate to severe Left PCA P2  segment stenosis. 3.  Stable non contrast CT appearance of the brain. 4.  Aortic Atherosclerosis (ICD10-I70.0). Electronically Signed   By: VEAR Hurst M.D.   On: 01/29/2024 10:53   MR Cervical Spine Wo Contrast Result Date: 01/29/2024 CLINICAL DATA:  Initial evaluation for cervical radiculopathy, left-sided weakness. EXAM:  MRI CERVICAL SPINE WITHOUT CONTRAST TECHNIQUE: Multiplanar, multisequence MR imaging of the cervical spine was performed. No intravenous contrast was administered. COMPARISON:  None Available. FINDINGS: Alignment: Straightening of the normal cervical lordosis. No listhesis. Vertebrae: Mild chronic height loss noted at the superior endplates of T2 through T4. Vertebral body height otherwise maintained with no acute or recent fracture. Bone marrow signal intensity within normal limits. No worrisome osseous lesions or abnormal marrow edema. Cord: Normal signal and morphology. Posterior Fossa, vertebral arteries, paraspinal tissues: Patchy signal abnormality within the pons, consistent with chronic small vessel ischemic disease. Craniocervical junction normal. Paraspinous soft tissues within normal limits. Normal flow voids seen within the vertebral arteries bilaterally. Disc levels: C2-C3: Negative interspace. Moderate left facet hypertrophy. No spinal stenosis. Foramina remain patent. C3-C4: Small right paracentral disc protrusion indents the ventral thecal sac (series 9, image 10). Moderate left with mild right facet hypertrophy. Resultant mild spinal stenosis. Moderate left C4 foraminal narrowing. Right neural foramen remains patent. C4-C5: Right paracentral disc protrusion indents the right ventral thecal sac, contacting and mildly flattening the right ventral cord (series 9, image 15). No cord signal changes. Superimposed bilateral uncovertebral and facet hypertrophy. Resultant moderate spinal stenosis. Moderate right with mild left C5 foraminal narrowing. C5-C6: Degenerative disc space  narrowing with circumferential disc osteophyte complex. Flattening and partial effacement of the ventral thecal sac. Resultant mild-to-moderate spinal stenosis. Severe right with moderate left C6 foraminal narrowing. C6-C7: Degenerative vertebral disc space narrowing with diffuse disc osteophyte complex. Flattening and partial effacement of the ventral thecal sac with resultant mild spinal stenosis. Moderate left with mild right C7 foraminal narrowing. C7-T1: Mild disc bulge. Mild facet and ligament flavum hypertrophy. No spinal stenosis. Foramina remain patent. IMPRESSION: 1. No acute abnormality about the cervical spine or spinal cord. 2. Multilevel cervical spondylosis with resultant mild to moderate diffuse spinal stenosis at C3-4 through C6-7. 3. Multifactorial degenerative changes with resultant multilevel foraminal narrowing as above. Notable findings include moderate left C4 foraminal stenosis, moderate right C5 foraminal narrowing, severe right with moderate left C6 foraminal stenosis, with moderate left C7 foraminal narrowing. Electronically Signed   By: Morene Hoard M.D.   On: 01/29/2024 02:47   MR BRAIN WO CONTRAST Result Date: 01/29/2024 CLINICAL DATA:  Initial evaluation for acute neuro deficit, stroke suspected. EXAM: MRI HEAD WITHOUT CONTRAST TECHNIQUE: Multiplanar, multiecho pulse sequences of the brain and surrounding structures were obtained without intravenous contrast. COMPARISON:  CT from 01/28/2024. FINDINGS: Brain: Cerebral volume within normal limits. Patchy T2/FLAIR hyperintensity involving the periventricular deep white matter both cerebral hemispheres as well as the pons, consistent with chronic small vessel ischemic disease, moderate in nature. 1.3 cm focus of restricted diffusion seen involving the right basal ganglia, consistent with an acute ischemic infarct (series 9, image 25). No associated hemorrhage or mass effect. No other evidence for acute or subacute ischemia.  Gray-white matter distinction otherwise maintained. No areas of chronic cortical infarction. No acute or significant chronic intracranial blood products. No mass lesion, midline shift or mass effect. No hydrocephalus or extra-axial fluid collection. Pituitary gland within normal limits. Vascular: Major intracranial vascular flow voids are maintained. Skull and upper cervical spine: Craniocervical junction normal limits. Bone marrow signal intensity normal. No scalp soft tissue abnormality. Sinuses/Orbits: Prior ocular lens replacement on the right. Paranasal sinuses are largely clear. No significant mastoid effusion. Other: None. IMPRESSION: 1. 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 2. Underlying moderate chronic microvascular ischemic disease. Electronically Signed   By: Morene  Nicholes M.D.   On: 01/29/2024 02:42   CT HEAD WO CONTRAST Result Date: 01/28/2024 CLINICAL DATA:  Weakness and visual changes. EXAM: CT HEAD WITHOUT CONTRAST TECHNIQUE: Contiguous axial images were obtained from the base of the skull through the vertex without intravenous contrast. RADIATION DOSE REDUCTION: This exam was performed according to the departmental dose-optimization program which includes automated exposure control, adjustment of the mA and/or kV according to patient size and/or use of iterative reconstruction technique. COMPARISON:  July 30, 2020 FINDINGS: Brain: There is generalized cerebral atrophy with widening of the extra-axial spaces and ventricular dilatation. There are areas of decreased attenuation within the white matter tracts of the supratentorial brain, consistent with microvascular disease changes. Vascular: No hyperdense vessel or unexpected calcification. Skull: Innumerable, predominantly subcentimeter lytic lesions are again seen scattered throughout the calvarium. Sinuses/Orbits: No acute finding. Other: None. IMPRESSION: 1. Generalized cerebral atrophy and microvascular  disease changes of the supratentorial brain. 2. No acute intracranial abnormality. 3. Innumerable, small lytic lesions scattered throughout the calvarium, consistent with the patient's history of multiple myeloma. Electronically Signed   By: Suzen Dials M.D.   On: 01/28/2024 21:48    Microbiology: Results for orders placed or performed during the hospital encounter of 03/31/21  Resp Panel by RT-PCR (Flu A&B, Covid) Nasopharyngeal Swab     Status: None   Collection Time: 03/31/21  3:19 AM   Specimen: Nasopharyngeal Swab; Nasopharyngeal(NP) swabs in vial transport medium  Result Value Ref Range Status   SARS Coronavirus 2 by RT PCR NEGATIVE NEGATIVE Final    Comment: (NOTE) SARS-CoV-2 target nucleic acids are NOT DETECTED.  The SARS-CoV-2 RNA is generally detectable in upper respiratory specimens during the acute phase of infection. The lowest concentration of SARS-CoV-2 viral copies this assay can detect is 138 copies/mL. A negative result does not preclude SARS-Cov-2 infection and should not be used as the sole basis for treatment or other patient management decisions. A negative result may occur with  improper specimen collection/handling, submission of specimen other than nasopharyngeal swab, presence of viral mutation(s) within the areas targeted by this assay, and inadequate number of viral copies(<138 copies/mL). A negative result must be combined with clinical observations, patient history, and epidemiological information. The expected result is Negative.  Fact Sheet for Patients:  BloggerCourse.com  Fact Sheet for Healthcare Providers:  SeriousBroker.it  This test is no t yet approved or cleared by the [place]  FDA and  has been authorized for detection and/or diagnosis of SARS-CoV-2 by FDA under an Emergency Use Authorization (EUA). This EUA will remain  in effect (meaning this test can be used) for the duration of  the COVID-19 declaration under Section 564(b)(1) of the Act, 21 U.S.C.section 360bbb-3(b)(1), unless the authorization is terminated  or revoked sooner.       Influenza A by PCR NEGATIVE NEGATIVE Final   Influenza B by PCR NEGATIVE NEGATIVE Final    Comment: (NOTE) The Xpert Xpress SARS-CoV-2/FLU/RSV plus assay is intended as an aid in the diagnosis of influenza from Nasopharyngeal swab specimens and should not be used as a sole basis for treatment. Nasal washings and aspirates are unacceptable for Xpert Xpress SARS-CoV-2/FLU/RSV testing.  Fact Sheet for Patients: BloggerCourse.com  Fact Sheet for Healthcare Providers: SeriousBroker.it  This test is not yet approved or cleared by the [place]  FDA and has been authorized for detection and/or diagnosis of SARS-CoV-2 by FDA under an Emergency Use Authorization (EUA). This EUA will remain in effect (meaning this test can be used)  for the duration of the COVID-19 declaration under Section 564(b)(1) of the Act, 21 U.S.C. section 360bbb-3(b)(1), unless the authorization is terminated or revoked.  Performed at [facility], [address] [place], [place] [address]   Blood Culture (routine x 2)     Status: None   Collection Time: 03/31/21  3:20 AM   Specimen: BLOOD  Result Value Ref Range Status   Specimen Description BLOOD LEFT AC  Final   Special Requests   Final    BOTTLES DRAWN AEROBIC AND ANAEROBIC Blood Culture results may not be optimal due to an inadequate volume of blood received in culture bottles   Culture   Final    NO GROWTH 5 DAYS Performed at [facility], [address] [place], [place] [address]    Report Status 04/05/2021 FINAL  Final  Blood Culture (routine x 2)     Status: None   Collection Time: 03/31/21  3:20 AM   Specimen: BLOOD  Result Value Ref Range Status   Specimen Description BLOOD RIGHT [facility]  Final   Special Requests    Final    BOTTLES DRAWN AEROBIC AND ANAEROBIC Blood Culture adequate volume   Culture   Final    NO GROWTH 5 DAYS Performed at [facility], [address] [place], [place] [address]    Report Status 04/05/2021 FINAL  Final  Aerobic/Anaerobic Culture w Gram Stain (surgical/deep wound)     Status: None   Collection Time: 03/31/21  3:26 PM   Specimen: Abscess  Result Value Ref Range Status   Specimen Description   Final    ABSCESS Performed at [facility], [address], [place], [place] [address]    Special Requests   Final    NONE Performed at [facility], [address] [place], [place] [address]    Gram Stain   Final    NO WBC SEEN ABUNDANT GRAM POSITIVE COCCI IN PAIRS AND CHAINS    Culture   Final    ABUNDANT STREPTOCOCCUS CONSTELLATUS MODERATE BACTEROIDES INTERMEDIUS BETA LACTAMASE POSITIVE Performed at [facility], [address], [place], [place] [address]    Report Status 04/04/2021 FINAL  Final   Organism ID, Bacteria STREPTOCOCCUS CONSTELLATUS  Final      Susceptibility   Streptococcus constellatus - MIC*    PENICILLIN <=0.06 SENSITIVE Sensitive     CEFTRIAXONE 0.25 SENSITIVE Sensitive     ERYTHROMYCIN >=8 RESISTANT Resistant     LEVOFLOXACIN <=0.25 SENSITIVE Sensitive     VANCOMYCIN  0.25 SENSITIVE Sensitive     * ABUNDANT STREPTOCOCCUS CONSTELLATUS    Labs: CBC: Recent Labs  Lab 01/28/24 2054  WBC 4.9  NEUTROABS 2.7  HGB 13.8  HCT 40.8  MCV 96.0  PLT 161   Basic Metabolic Panel: Recent Labs  Lab 01/28/24 2054  NA 139  K 4.1  CL 104  CO2 25  GLUCOSE 97  BUN 21  CREATININE 1.00  CALCIUM  8.8*   Liver Function Tests: Recent Labs  Lab 01/28/24 2054  AST 24  ALT 25  ALKPHOS 96  BILITOT 0.5  PROT 6.8  ALBUMIN 4.0   CBG: Recent Labs  Lab 01/28/24 2101 01/30/24 1727  GLUCAP 92 92    Discharge time spent: 25 minutes.  Length of inpatient stay: 0 days  Signed: Carliss LELON Canales, DO [facility] 02/02/2024

## 2024-02-02 NOTE — Progress Notes (Signed)
 Mobility Specialist - Progress Note   02/02/24 0923  Mobility  Activity Ambulated with assistance  Level of Assistance Standby assist, set-up cues, supervision of patient - no hands on  Assistive Device  (rollator)  Distance Ambulated (ft) 480 ft  Activity Response Tolerated well  Mobility visit 1 Mobility  Mobility Specialist Start Time (ACUTE ONLY) [id number]  Mobility Specialist Stop Time (ACUTE ONLY) 0910  Mobility Specialist Time Calculation (min) (ACUTE ONLY) 12 min   Pt supine upon entry, utilizing RA. Pt motivated and agreeable to OOB amb this date. Pt expressed being able to utilize her L hand more than date in comparison to previous dates. Pt amb a self-selected three laps around the NS, tolerated well-- no hands on assist required. Pt returned to the room, left standing at bedside with PT present upon MS exit. RN notified.  Laurie [name] Mobility Specialist 02/02/24 9:30 AM

## 2024-02-02 NOTE — Progress Notes (Signed)
 Physical Therapy Treatment Patient Details Name: Laurie [name] MRN: [id number] DOB: [birth year] Today's Date: 02/02/2024   History of Present Illness Pt is a pleasant 78 y.o. female with medical history significant for multiple myeloma on chemotherapy, asthma, osteoporosis who presented to [facility] ED for left-sided weakness. MRI of the brain revealed an acute right basal ganglia ischemic stroke.    PT Comments  Pt ready for session.  Just finished walking with mobility specialist with rollator with no difficulties.  She does want to try walking with no AD and completes x 1 lap with no outside assist.  Somewhat slog gait with dec step length and height but no LOB or buckling.  Had DME needed at home.  Discussed her concerns about discharge home and she stated she has none and would like to go home vs SNF.  Will update team  Recommendations stand.   If plan is discharge home, recommend the following: A little help with walking and/or transfers;A little help with bathing/dressing/bathroom;Assistance with cooking/housework;Assist for transportation;Help with stairs or ramp for entrance   Can travel by private vehicle        Equipment Recommendations       Recommendations for Other Services       Precautions / Restrictions Precautions Precautions: Fall Recall of Precautions/Restrictions: Intact Restrictions Weight Bearing Restrictions Per Provider Order: No     Mobility  Bed Mobility Overal bed mobility: Modified Independent               Patient Response: Cooperative  Transfers Overall transfer level: Modified independent     Sit to Stand: Modified independent (Device/Increase time)                Ambulation/Gait Ambulation/Gait assistance: Modified independent (Device/Increase time) Gait Distance (Feet): 200 Feet Assistive device: None Gait Pattern/deviations: Step-through pattern Gait velocity: decreased     General Gait Details: does well with rollator.   wants to try no AD - slow gait but no LOB or buckling.   Stairs             Wheelchair Mobility     Tilt Bed Tilt Bed Patient Response: Cooperative  Modified Rankin (Stroke Patients Only)       Balance Overall balance assessment: Needs assistance Sitting-balance support: Feet supported Sitting balance-Leahy Scale: Normal     Standing balance support: No upper extremity supported Standing balance-Leahy Scale: Fair                              Hotel manager: No apparent difficulties  Cognition Arousal: Alert Behavior During Therapy: WFL for tasks assessed/performed   PT - Cognitive impairments: No family/caregiver present to determine baseline                       PT - Cognition Comments: imporved today.  very talkative and somewhat scattered hopping through subjects but good safety Following commands: Intact      Cueing    Exercises      General Comments        Pertinent Vitals/Pain Pain Assessment Pain Assessment: No/denies pain    Home Living                          Prior Function            PT Goals (current goals can now be found in the care plan section)  Progress towards PT goals: Progressing toward goals    Frequency    Min 3X/week      PT Plan      Co-evaluation              AM-PAC PT 6 Clicks Mobility   Outcome Measure  Help needed turning from your back to your side while in a flat bed without using bedrails?: None Help needed moving from lying on your back to sitting on the side of a flat bed without using bedrails?: None Help needed moving to and from a bed to a chair (including a wheelchair)?: None Help needed standing up from a chair using your arms (e.g., wheelchair or bedside chair)?: None Help needed to walk in hospital room?: None Help needed climbing 3-5 steps with a railing? : A Little 6 Click Score: 23    End of Session   Activity  Tolerance: Patient tolerated treatment well Patient left: in chair;with call bell/phone within reach Nurse Communication: Mobility status PT Visit Diagnosis: Other abnormalities of gait and mobility (R26.89)     Time: 9089-9080 PT Time Calculation (min) (ACUTE ONLY): 9 min  Charges:    $Gait Training: 8-22 mins PT General Charges $$ ACUTE PT VISIT: 1 Visit                    Lauraine Gills, PTA 02/02/24, 9:26 AM

## 2024-02-02 NOTE — Plan of Care (Signed)

## 2024-02-16 ENCOUNTER — Other Ambulatory Visit: Payer: Self-pay

## 2024-02-16 DIAGNOSIS — C9001 Multiple myeloma in remission: Secondary | ICD-10-CM

## 2024-02-16 MED ORDER — LENALIDOMIDE 10 MG PO CAPS
10.0000 mg | ORAL_CAPSULE | Freq: Every day | ORAL | 0 refills | Status: DC
Start: 2024-02-16 — End: 2024-03-20

## 2024-03-03 ENCOUNTER — Encounter: Payer: Self-pay | Admitting: Oncology

## 2024-03-03 ENCOUNTER — Encounter: Payer: Self-pay | Admitting: Hematology and Oncology

## 2024-03-20 ENCOUNTER — Other Ambulatory Visit: Payer: Self-pay | Admitting: Oncology

## 2024-03-20 DIAGNOSIS — C9001 Multiple myeloma in remission: Secondary | ICD-10-CM

## 2024-04-05 ENCOUNTER — Other Ambulatory Visit: Payer: Self-pay

## 2024-04-07 ENCOUNTER — Inpatient Hospital Stay: Attending: Oncology

## 2024-04-07 ENCOUNTER — Encounter: Payer: Self-pay | Admitting: Oncology

## 2024-04-07 ENCOUNTER — Inpatient Hospital Stay: Admitting: Oncology

## 2024-04-07 VITALS — BP 117/75 | HR 95 | Temp 98.6°F | Resp 19 | Wt 146.6 lb

## 2024-04-07 DIAGNOSIS — Z7961 Long term (current) use of immunomodulator: Secondary | ICD-10-CM | POA: Diagnosis not present

## 2024-04-07 DIAGNOSIS — M810 Age-related osteoporosis without current pathological fracture: Secondary | ICD-10-CM | POA: Insufficient documentation

## 2024-04-07 DIAGNOSIS — Z803 Family history of malignant neoplasm of breast: Secondary | ICD-10-CM | POA: Insufficient documentation

## 2024-04-07 DIAGNOSIS — C9001 Multiple myeloma in remission: Secondary | ICD-10-CM | POA: Insufficient documentation

## 2024-04-07 DIAGNOSIS — Z79899 Other long term (current) drug therapy: Secondary | ICD-10-CM

## 2024-04-07 DIAGNOSIS — Z808 Family history of malignant neoplasm of other organs or systems: Secondary | ICD-10-CM | POA: Insufficient documentation

## 2024-04-07 DIAGNOSIS — Z7982 Long term (current) use of aspirin: Secondary | ICD-10-CM | POA: Insufficient documentation

## 2024-04-07 DIAGNOSIS — Z79624 Long term (current) use of inhibitors of nucleotide synthesis: Secondary | ICD-10-CM | POA: Insufficient documentation

## 2024-04-07 LAB — CBC WITH DIFFERENTIAL (CANCER CENTER ONLY)
Abs Immature Granulocytes: 0.04 K/uL (ref 0.00–0.07)
Basophils Absolute: 0.1 K/uL (ref 0.0–0.1)
Basophils Relative: 2 %
Eosinophils Absolute: 0.4 K/uL (ref 0.0–0.5)
Eosinophils Relative: 8 %
HCT: 39 % (ref 36.0–46.0)
Hemoglobin: 13.3 g/dL (ref 12.0–15.0)
Immature Granulocytes: 1 %
Lymphocytes Relative: 23 %
Lymphs Abs: 1.1 K/uL (ref 0.7–4.0)
MCH: 31.7 pg (ref 26.0–34.0)
MCHC: 34.1 g/dL (ref 30.0–36.0)
MCV: 92.9 fL (ref 80.0–100.0)
Monocytes Absolute: 0.5 K/uL (ref 0.1–1.0)
Monocytes Relative: 11 %
Neutro Abs: 2.7 K/uL (ref 1.7–7.7)
Neutrophils Relative %: 55 %
Platelet Count: 180 K/uL (ref 150–400)
RBC: 4.2 MIL/uL (ref 3.87–5.11)
RDW: 13.2 % (ref 11.5–15.5)
WBC Count: 4.8 K/uL (ref 4.0–10.5)
nRBC: 0 % (ref 0.0–0.2)

## 2024-04-07 LAB — CMP (CANCER CENTER ONLY)
ALT: 29 U/L (ref 0–44)
AST: 23 U/L (ref 15–41)
Albumin: 4 g/dL (ref 3.5–5.0)
Alkaline Phosphatase: 108 U/L (ref 38–126)
Anion gap: 11 (ref 5–15)
BUN: 15 mg/dL (ref 8–23)
CO2: 25 mmol/L (ref 22–32)
Calcium: 9.2 mg/dL (ref 8.9–10.3)
Chloride: 104 mmol/L (ref 98–111)
Creatinine: 0.98 mg/dL (ref 0.44–1.00)
GFR, Estimated: 59 mL/min — ABNORMAL LOW (ref >60)
Glucose, Bld: 94 mg/dL (ref 70–99)
Potassium: 4.2 mmol/L (ref 3.5–5.1)
Sodium: 139 mmol/L (ref 135–145)
Total Bilirubin: 0.3 mg/dL (ref 0.0–1.2)
Total Protein: 6.1 g/dL — ABNORMAL LOW (ref 6.5–8.1)

## 2024-04-08 ENCOUNTER — Encounter: Payer: Self-pay | Admitting: Oncology

## 2024-04-08 ENCOUNTER — Encounter: Payer: Self-pay | Admitting: Hematology and Oncology

## 2024-04-08 NOTE — Progress Notes (Signed)
 Hematology/Oncology Consult note [facility]  Telephone:([phone] Fax:([phone]) [phone]  Patient Care Team: Tobie Domino, MD as PCP - General (Family Medicine) Melanee Annah BROCKS, MD as Consulting Physician (Oncology)   Name of the patient: Laurie [name]  [id number]  May 10, 1945   Date of visit: 04/08/24  Diagnosis- stage IIIA IgA multiple myeloma in remission   Chief complaint/ Reason for visit- routine f/u of multiple myeloma on revlimid   Heme/Onc history: Patient is a 78 year old female who was diagnosed with stage III biclonal IgA multiple myeloma in June 2021. SPEP was 5.0 gm/dL on 93/98/7978. Random urine revealed 196.6 mg/dL total protein with 41.6% M-spike.  Bone marrow biopsy on 11/13/2019 revealed a hypercellular bone marrow with extensive involvement by plasma cell neoplasm. Atypical plasma cells represented 70% of all cells in the aspirate and was associated with prominent interstitial infiltrates and diffuse sheets in the clot.  Plasma cells were kappa light chain restricted.   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   Bone survey on 10/26/2019 revealed small lytic lesions in the skull. There was no acute fracture.   Patient has been on daratumumab  Revlimid  dexamethasone  regimen since July 2021.  She briefly received Retacrit  in the past but has not required it for over 1 year now.  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  History  of Present Illness   Laurie [name] is a 78 year old female with multiple myeloma who presents for follow-up of her myeloma treatment.  She was hospitalized in October for a stroke and was started on Plavix . Despite advice from a neurologist to discontinue aspirin , she continues to take it.  Tolerating revlimid  well without significant side effects     ECOG PS- 2 Pain scale- 0  Review of systems- Review of Systems  Constitutional:  Negative for chills, fever, malaise/fatigue and weight loss.  HENT:  Negative for congestion, ear discharge and nosebleeds.   Eyes:  Negative for blurred vision.  Respiratory:  Negative for cough, hemoptysis, sputum production, shortness of breath and wheezing.   Cardiovascular:  Negative for chest pain, palpitations, orthopnea and claudication.  Gastrointestinal:  Negative for abdominal pain, blood in stool, constipation, diarrhea, heartburn, melena, nausea and vomiting.  Genitourinary:  Negative for dysuria, flank pain, frequency, hematuria and urgency.  Musculoskeletal:  Negative for back pain, joint pain and myalgias.  Skin:  Negative for rash.  Neurological:  Negative for dizziness, tingling, focal weakness, seizures, weakness and headaches.  Endo/Heme/Allergies:  Does not bruise/bleed easily.  Psychiatric/Behavioral:  Negative for depression and suicidal ideas. The patient does not have insomnia.       Allergies  Allergen Reactions   Levofloxacin     Critical  Other Reaction(s): Itching hives     Past Medical History:  Diagnosis Date   Anxiety    Asthma    Cellulitis and abscess of oral soft tissues    Cellulitis and abscess of oral soft tissues 03/31/2021   pt in hospital and now out as  outpt   Depression    Head injury    Multiple myeloma (HCC)    Osteoporosis    PONV (postoperative nausea and vomiting)      Past Surgical History:  Procedure Laterality Date   ABDOMINAL HYSTERECTOMY     CATARACT EXTRACTION W/PHACO Right  07/27/2022   Procedure: CATARACT EXTRACTION PHACO AND INTRAOCULAR LENS PLACEMENT (IOC) RIGHT VISION BLUE;  Surgeon: Enola Feliciano Hugger, MD;  Location: [facility];  Service: Ophthalmology;  Laterality: Right;  21.53 2:00.6   HEMORROIDECTOMY     MOUTH SURGERY     SEPTOPLASTY      Social History   Socioeconomic History   Marital status: Widowed    Spouse name: Not on file   Number of children: Not on file   Years of education: Not on file   Highest education level: Not on file  Occupational History   Not on file  Tobacco Use   Smoking status: Never   Smokeless tobacco: Never  Vaping Use   Vaping status: Never Used  Substance and Sexual Activity   Alcohol use: No   Drug use: No   Sexual activity: Not Currently  Other Topics Concern   Not on file  Social History Narrative   Jehovah Witness    Social Drivers of Health   Financial Resource Strain: Low Risk (08/12/2022)   Received from [facility]   Overall Financial Resource Strain (CARDIA)    Difficulty of Paying Living Expenses: Not very hard  Food Insecurity: No Food Insecurity (01/29/2024)   Hunger Vital Sign    Worried About Running Out of Food in the Last Year: Never true    Ran Out of Food in the Last Year: Never true  Transportation Needs: No Transportation Needs (01/29/2024)   PRAPARE - Administrator, Civil Service (Medical): No    Lack of Transportation (Non-Medical): No  Physical Activity: Not on file  Stress: Not on file  Social Connections: Moderately Integrated (01/29/2024)   Social Connection and Isolation Panel    Frequency of Communication with Friends and Family: Twice a week    Frequency of Social Gatherings with Friends and Family: Twice a week    Attends Religious Services: 1 to 4 times per year    Active Member of [employer] or Organizations: Yes    Attends Banker Meetings: 1 to 4 times per year    Marital Status: Widowed  Intimate Partner Violence: Not At Risk  (01/29/2024)   Humiliation, Afraid, Rape, and Kick questionnaire    Fear of Current or Ex-Partner: No    Emotionally Abused: No    Physically Abused: No    Sexually Abused: No    Family History  Problem Relation Age of Onset   Prostate cancer Brother    Throat cancer Maternal Aunt    Breast cancer Cousin        mat cousin   Breast cancer Cousin    Thyroid  cancer Other      Current Outpatient Medications:    acetaminophen  (TYLENOL ) 325 MG tablet, Take 650 mg by mouth every 8 (eight) hours as needed for mild pain (pain score 1-3)., Disp: , Rfl:    acyclovir  (ZOVIRAX ) 400 MG tablet, TAKE 1 TABLET BY MOUTH TWICE A DAY, Disp: 180 tablet, Rfl: 3   albuterol  (VENTOLIN  HFA) 108 (90 Base) MCG/ACT inhaler, 2 inhalations every 4 (four) hours as needed., Disp: , Rfl:    aspirin  81 MG chewable tablet, Chew 1 tablet (  81 mg total) by mouth daily., Disp: 90 tablet, Rfl: 0   atorvastatin  (LIPITOR) 20 MG tablet, Take 1 tablet (20 mg total) by mouth daily., Disp: 30 tablet, Rfl: 0   busPIRone  (BUSPAR ) 10 MG tablet, Take 1 tablet (10 mg total) by mouth 2 (two) times daily., Disp: , Rfl:    Calcium  Carb-Cholecalciferol (CALCIUM  600 + D PO), Take 600 mg by mouth 4 (four) times daily. Increase calcium  from 600mg  4 times a day to 600mg  5 times a day starting today, Disp: , Rfl:    clopidogrel  (PLAVIX ) 75 MG tablet, Take 1 tablet (75 mg total) by mouth daily., Disp: 90 tablet, Rfl: 3   cyanocobalamin  (VITAMIN B12) 1000 MCG tablet, Take 1,000 mcg by mouth daily., Disp: , Rfl:    docusate sodium (COLACE) 100 MG capsule, Take 100 mg by mouth 2 (two) times daily., Disp: , Rfl:    lenalidomide  (REVLIMID ) 10 MG capsule, TAKE 1 CAPSULE BY MOUTH 1 TIME A DAY, Disp: 28 capsule, Rfl: 0   montelukast  (SINGULAIR ) 10 MG tablet, Take 10 mg by mouth daily after breakfast., Disp: , Rfl:    nortriptyline  (PAMELOR ) 50 MG capsule, Take 50 mg by mouth at bedtime., Disp: , Rfl:    omeprazole  (PRILOSEC) 20 MG capsule, Take 20 mg  by mouth daily., Disp: , Rfl:    ondansetron  (ZOFRAN ) 8 MG tablet, Take 1 tablet (8 mg total) by mouth every 8 (eight) hours as needed., Disp: 60 tablet, Rfl: 3   pentoxifylline  (TRENTAL ) 400 MG CR tablet, Take 400 mg by mouth 2 (two) times daily., Disp: , Rfl:    sertraline  (ZOLOFT ) 100 MG tablet, Take 100 mg by mouth 2 (two) times daily., Disp: , Rfl:    traZODone  (DESYREL ) 50 MG tablet, Take 50 mg by mouth at bedtime., Disp: , Rfl:    Vitamin D, Ergocalciferol, (DRISDOL) 1.25 MG (50000 UNIT) CAPS capsule, Take 1,250 mcg by mouth., Disp: , Rfl:    vitamin E 1000 UNIT capsule, Take 1,000 Units by mouth daily., Disp: , Rfl:    dexamethasone  (DECADRON ) 4 MG tablet, Take 5 tablets (20 mg total) by mouth once a week., Disp: 120 tablet, Rfl: 2  Physical exam:  Vitals:   04/07/24 1418  BP: 117/75  Pulse: 95  Resp: 19  Temp: 98.6 F (37 C)  SpO2: 97%  Weight: 146 lb 9.6 oz (66.5 kg)   Physical Exam Cardiovascular:     Rate and Rhythm: Normal rate and regular rhythm.     Heart sounds: Normal heart sounds.  Pulmonary:     Effort: Pulmonary effort is normal.     Breath sounds: Normal breath sounds.  Skin:    General: Skin is warm and dry.  Neurological:     Mental Status: She is alert and oriented to person, place, and time.      I have personally reviewed labs listed below:    Latest Ref Rng & Units 04/07/2024    2:01 PM  CMP  Glucose 70 - 99 mg/dL 94   BUN 8 - 23 mg/dL 15   Creatinine 9.55 - 1.00 mg/dL 9.01   Sodium 864 - 854 mmol/L 139   Potassium 3.5 - 5.1 mmol/L 4.2   Chloride 98 - 111 mmol/L 104   CO2 22 - 32 mmol/L 25   Calcium  8.9 - 10.3 mg/dL 9.2   Total Protein 6.5 - 8.1 g/dL 6.1   Total Bilirubin 0.0 - 1.2 mg/dL 0.3   Alkaline Phos 38 -  126 U/L 108   AST 15 - 41 U/L 23   ALT 0 - 44 U/L 29       Latest Ref Rng & Units 04/07/2024    2:00 PM  CBC  WBC 4.0 - 10.5 K/uL 4.8   Hemoglobin 12.0 - 15.0 g/dL 86.6   Hematocrit 63.9 - 46.0 % 39.0   Platelets 150 - 400  K/uL 180     Assessment and plan- Patient is a 78 y.o. female here for routine f/u multiple myeloma Stage IIIA IgA kappa currently on revlimid  here for routine f/u  Assessment and Plan    Multiple myeloma in remission Multiple myeloma remains in remission with stable disease markers. She tolerated Revlimid  well. - Continue Revlimid  10 mg daily.  History of cerebral infarction Cerebral infarction occurred in October. She needs to continue aspirin  due to increased thrombosis risk from Revlimid . - Continue aspirin . - Continue Plavix .         Visit Diagnosis 1. Multiple myeloma in remission (HCC)   2. High risk medication use      Dr. Annah Skene, MD, MPH [facility] [phone] 04/08/2024 12:17 PM

## 2024-04-09 ENCOUNTER — Other Ambulatory Visit: Payer: Self-pay

## 2024-04-10 ENCOUNTER — Encounter: Payer: Self-pay | Admitting: Hematology and Oncology

## 2024-04-10 ENCOUNTER — Encounter: Payer: Self-pay | Admitting: Oncology

## 2024-04-10 LAB — MULTIPLE MYELOMA PANEL, SERUM
Albumin SerPl Elph-Mcnc: 3.6 g/dL (ref 2.9–4.4)
Albumin/Glob SerPl: 1.6 (ref 0.7–1.7)
Alpha 1: 0.2 g/dL (ref 0.0–0.4)
Alpha2 Glob SerPl Elph-Mcnc: 0.6 g/dL (ref 0.4–1.0)
B-Globulin SerPl Elph-Mcnc: 1 g/dL (ref 0.7–1.3)
Gamma Glob SerPl Elph-Mcnc: 0.6 g/dL (ref 0.4–1.8)
Globulin, Total: 2.4 g/dL (ref 2.2–3.9)
IgA: 184 mg/dL (ref 64–422)
IgG (Immunoglobin G), Serum: 712 mg/dL (ref 586–1602)
IgM (Immunoglobulin M), Srm: 34 mg/dL (ref 26–217)
Total Protein ELP: 6 g/dL (ref 6.0–8.5)

## 2024-04-10 LAB — KAPPA/LAMBDA LIGHT CHAINS
Kappa free light chain: 21.6 mg/L — ABNORMAL HIGH (ref 3.3–19.4)
Kappa, lambda light chain ratio: 1 (ref 0.26–1.65)
Lambda free light chains: 21.5 mg/L (ref 5.7–26.3)

## 2024-04-12 ENCOUNTER — Encounter: Payer: Self-pay | Admitting: Ophthalmology

## 2024-04-13 ENCOUNTER — Encounter: Payer: Self-pay | Admitting: Ophthalmology

## 2024-04-13 ENCOUNTER — Ambulatory Visit
Admission: RE | Admit: 2024-04-13 | Discharge: 2024-04-13 | Disposition: A | Attending: Ophthalmology | Admitting: Ophthalmology

## 2024-04-13 ENCOUNTER — Other Ambulatory Visit: Payer: Self-pay

## 2024-04-13 ENCOUNTER — Encounter: Admission: RE | Disposition: A | Payer: Self-pay | Attending: Ophthalmology

## 2024-04-13 ENCOUNTER — Ambulatory Visit: Admitting: Anesthesiology

## 2024-04-13 DIAGNOSIS — F419 Anxiety disorder, unspecified: Secondary | ICD-10-CM | POA: Diagnosis not present

## 2024-04-13 DIAGNOSIS — H2512 Age-related nuclear cataract, left eye: Secondary | ICD-10-CM | POA: Diagnosis present

## 2024-04-13 DIAGNOSIS — J45909 Unspecified asthma, uncomplicated: Secondary | ICD-10-CM | POA: Diagnosis not present

## 2024-04-13 DIAGNOSIS — F32A Depression, unspecified: Secondary | ICD-10-CM | POA: Diagnosis not present

## 2024-04-13 DIAGNOSIS — Z8673 Personal history of transient ischemic attack (TIA), and cerebral infarction without residual deficits: Secondary | ICD-10-CM | POA: Diagnosis not present

## 2024-04-13 HISTORY — PX: CATARACT EXTRACTION W/PHACO: SHX586

## 2024-04-13 SURGERY — PHACOEMULSIFICATION, CATARACT, WITH IOL INSERTION
Anesthesia: Topical | Laterality: Left

## 2024-04-13 MED ORDER — TETRACAINE HCL 0.5 % OP SOLN
OPHTHALMIC | Status: AC
  Filled 2024-04-13: qty 4

## 2024-04-13 MED ORDER — FENTANYL CITRATE (PF) 100 MCG/2ML IJ SOLN
INTRAMUSCULAR | Status: AC
  Filled 2024-04-13: qty 2

## 2024-04-13 MED ORDER — SIGHTPATH DOSE#1 BSS IO SOLN
INTRAOCULAR | Status: DC | PRN
  Administered 2024-04-13: 15 mL via INTRAOCULAR

## 2024-04-13 MED ORDER — CYCLOPENTOLATE HCL 2 % OP SOLN
OPHTHALMIC | Status: AC
  Filled 2024-04-13: qty 2

## 2024-04-13 MED ORDER — MOXIFLOXACIN HCL 0.5 % OP SOLN
OPHTHALMIC | Status: DC | PRN
  Administered 2024-04-13: .2 mL via OPHTHALMIC

## 2024-04-13 MED ORDER — ONDANSETRON HCL 4 MG/2ML IJ SOLN
INTRAMUSCULAR | Status: AC
  Filled 2024-04-13: qty 2

## 2024-04-13 MED ORDER — TETRACAINE HCL 0.5 % OP SOLN
1.0000 [drp] | OPHTHALMIC | Status: DC | PRN
  Administered 2024-04-13 (×3): 1 [drp] via OPHTHALMIC

## 2024-04-13 MED ORDER — ONDANSETRON HCL 4 MG/2ML IJ SOLN
INTRAMUSCULAR | Status: DC | PRN
  Administered 2024-04-13: 4 mg via INTRAVENOUS

## 2024-04-13 MED ORDER — SIGHTPATH DOSE#1 NA HYALUR & NA CHOND-NA HYALUR IO KIT
PACK | INTRAOCULAR | Status: DC | PRN
  Administered 2024-04-13: 1 via OPHTHALMIC

## 2024-04-13 MED ORDER — LACTATED RINGERS IV SOLN: INTRAVENOUS | Status: DC

## 2024-04-13 MED ORDER — PHENYLEPHRINE HCL 10 % OP SOLN
OPHTHALMIC | Status: AC
  Filled 2024-04-13: qty 5

## 2024-04-13 MED ORDER — MIDAZOLAM HCL 2 MG/2ML IJ SOLN
INTRAMUSCULAR | Status: AC
  Filled 2024-04-13: qty 2

## 2024-04-13 MED ORDER — PHENYLEPHRINE HCL 10 % OP SOLN
1.0000 [drp] | OPHTHALMIC | Status: AC
  Administered 2024-04-13 (×3): 1 [drp] via OPHTHALMIC

## 2024-04-13 MED ORDER — FENTANYL CITRATE (PF) 100 MCG/2ML IJ SOLN
INTRAMUSCULAR | Status: DC | PRN
  Administered 2024-04-13 (×2): 50 ug via INTRAVENOUS

## 2024-04-13 MED ORDER — MIDAZOLAM HCL (PF) 2 MG/2ML IJ SOLN
INTRAMUSCULAR | Status: DC | PRN
  Administered 2024-04-13: 1 mg via INTRAVENOUS

## 2024-04-13 MED ORDER — CYCLOPENTOLATE HCL 2 % OP SOLN
1.0000 [drp] | OPHTHALMIC | Status: AC
  Administered 2024-04-13 (×3): 1 [drp] via OPHTHALMIC

## 2024-04-13 MED ORDER — SIGHTPATH DOSE#1 BSS IO SOLN
INTRAOCULAR | Status: DC | PRN
  Administered 2024-04-13: 74 mL via OPHTHALMIC

## 2024-04-13 MED ORDER — LIDOCAINE HCL (PF) 2 % IJ SOLN
INTRAOCULAR | Status: DC | PRN
  Administered 2024-04-13: 1 mL via INTRAOCULAR

## 2024-04-13 MED ORDER — BRIMONIDINE TARTRATE-TIMOLOL 0.2-0.5 % OP SOLN
OPHTHALMIC | Status: DC | PRN
  Administered 2024-04-13: 1 [drp] via OPHTHALMIC

## 2024-04-13 SURGICAL SUPPLY — 9 items
DISSECTOR HYDRO NUCLEUS 50X22 (MISCELLANEOUS) ×1 IMPLANT
DRSG TEGADERM 2-3/8X2-3/4 SM (GAUZE/BANDAGES/DRESSINGS) ×1 IMPLANT
FEE CATARACT SUITE SIGHTPATH (MISCELLANEOUS) ×1 IMPLANT
GLOVE BIOGEL PI IND STRL 8 (GLOVE) ×1 IMPLANT
GLOVE SURG LX STRL 7.5 STRW (GLOVE) ×1 IMPLANT
GLOVE SURG SYN 6.5 PF PI BL (GLOVE) ×1 IMPLANT
LENS IOL TECNIS EYHANCE 18.5 (Intraocular Lens) IMPLANT
NDL FILTER BLUNT 18X1 1/2 (NEEDLE) ×1 IMPLANT
SYR 3ML LL SCALE MARK (SYRINGE) ×1 IMPLANT

## 2024-04-13 NOTE — Anesthesia Preprocedure Evaluation (Signed)
 Anesthesia Evaluation  Patient identified by MRN, date of birth, ID band Patient awake    Reviewed: Allergy & Precautions, NPO status , Patient's Chart, lab work & pertinent test results  History of Anesthesia Complications (+) PONV and history of anesthetic complications  Airway Mallampati: III  TM Distance: >3 FB Neck ROM: full    Dental  (+) Chipped   Pulmonary asthma    Pulmonary exam normal        Cardiovascular negative cardio ROS Normal cardiovascular exam     Neuro/Psych  Headaches PSYCHIATRIC DISORDERS Anxiety Depression    CVA, Residual Symptoms    GI/Hepatic negative GI ROS, Neg liver ROS,,,  Endo/Other  negative endocrine ROS    Renal/GU      Musculoskeletal   Abdominal   Peds  Hematology  (+) Blood dyscrasia, anemia   Anesthesia Other Findings Past Medical History: No date: Anxiety No date: Asthma No date: Cellulitis and abscess of oral soft tissues 03/31/2021: Cellulitis and abscess of oral soft tissues     Comment:  pt in hospital and now out as outpt No date: Depression No date: Head injury No date: Multiple myeloma (HCC) No date: Osteoporosis No date: PONV (postoperative nausea and vomiting)  Past Surgical History: No date: ABDOMINAL HYSTERECTOMY 07/27/2022: CATARACT EXTRACTION W/PHACO; Right     Comment:  Procedure: CATARACT EXTRACTION PHACO AND INTRAOCULAR               LENS PLACEMENT (IOC) RIGHT VISION BLUE;  Surgeon:               Enola Feliciano Hugger, MD;  Location: [facility];  Service: Ophthalmology;  Laterality: Right;                21.53 2:00.6 No date: HEMORROIDECTOMY No date: MOUTH SURGERY No date: SEPTOPLASTY  BMI    Body Mass Index: 26.52 kg/m      Reproductive/Obstetrics negative OB ROS                              Anesthesia Physical Anesthesia Plan  ASA: 3  Anesthesia Plan: MAC   Post-op Pain Management:  Minimal or no pain anticipated   Induction: Intravenous  PONV Risk Score and Plan:   Airway Management Planned: Natural Airway and Nasal Cannula  Additional Equipment:   Intra-op Plan:   Post-operative Plan:   Informed Consent: I have reviewed the patients History and Physical, chart, labs and discussed the procedure including the risks, benefits and alternatives for the proposed anesthesia with the patient or authorized representative who has indicated his/her understanding and acceptance.     Dental Advisory Given  Plan Discussed with: Anesthesiologist, CRNA and Surgeon  Anesthesia Plan Comments: (Patient consented for risks of anesthesia including but not limited to:  - adverse reactions to medications - damage to eyes, teeth, lips or other oral mucosa - nerve damage due to positioning  - sore throat or hoarseness - Damage to heart, brain, nerves, lungs, other parts of body or loss of life  Patient voiced understanding and assent.)        Anesthesia Quick Evaluation

## 2024-04-13 NOTE — Transfer of Care (Signed)
 Immediate Anesthesia Transfer of Care Note  Patient: Laurie [name]  Procedure(s) Performed: PHACOEMULSIFICATION, CATARACT, WITH IOL INSERTION 18.32, 01:34.5 (Left)  Patient Location: PACU  Anesthesia Type: MAC  Level of Consciousness: awake, alert  and patient cooperative  Airway and Oxygen Therapy: Patient Spontanous Breathing and Patient connected to supplemental oxygen  Post-op Assessment: Post-op Vital signs reviewed, Patient's Cardiovascular Status Stable, Respiratory Function Stable, Patent Airway and No signs of Nausea or vomiting  Post-op Vital Signs: Reviewed and stable  Complications: No notable events documented.

## 2024-04-13 NOTE — H&P (Signed)
 [facility]   Primary Care Physician:  Tobie Domino, MD Ophthalmologist: Dr. Feliciano Ober  Pre-Procedure History & Physical: HPI:  Laurie [name] is a 78 y.o. female here for cataract surgery.   Past Medical History:  Diagnosis Date   Anxiety    Asthma    Cellulitis and abscess of oral soft tissues    Cellulitis and abscess of oral soft tissues 03/31/2021   pt in hospital and now out as outpt   Depression    Head injury    Multiple myeloma (HCC)    Osteoporosis    PONV (postoperative nausea and vomiting)     Past Surgical History:  Procedure Laterality Date   ABDOMINAL HYSTERECTOMY     CATARACT EXTRACTION W/PHACO Right 07/27/2022   Procedure: CATARACT EXTRACTION PHACO AND INTRAOCULAR LENS PLACEMENT (IOC) RIGHT VISION BLUE;  Surgeon: Ober Feliciano Hugger, MD;  Location: [facility];  Service: Ophthalmology;  Laterality: Right;  21.53 2:00.6   HEMORROIDECTOMY     MOUTH SURGERY     SEPTOPLASTY      Prior to Admission medications  Medication Sig Start Date End Date Taking? Authorizing Provider  acetaminophen  (TYLENOL ) 325 MG tablet Take 650 mg by mouth every 8 (eight) hours as needed for mild pain (pain score 1-3).   Yes [provider]  acyclovir  (ZOVIRAX ) 400 MG tablet TAKE 1 TABLET BY MOUTH TWICE A DAY 09/23/23  Yes Melanee Annah BROCKS, MD  albuterol  (VENTOLIN  HFA) 108 [phone] Base) MCG/ACT inhaler 2 inhalations every 4 (four) hours as needed.   Yes [provider]  aspirin  81 MG chewable tablet Chew 1 tablet (81 mg total) by mouth daily. 02/02/24  Yes Arlon Carliss ORN, DO  atorvastatin  (LIPITOR) 20 MG tablet Take 1 tablet (20 mg total) by mouth daily. 02/02/24  Yes Arlon Carliss ORN, DO  busPIRone  (BUSPAR ) 10 MG tablet Take 1 tablet (10 mg total) by mouth 2 (two) times daily. 02/02/24  Yes Arlon Carliss ORN, DO  Calcium  Carb-Cholecalciferol (CALCIUM  600 + D PO) Take 600 mg by mouth 4 (four) times daily. Increase calcium  from 600mg  4 times a day to 600mg   5 times a day starting today 06/13/20  Yes [provider]  clopidogrel  (PLAVIX ) 75 MG tablet Take 1 tablet (75 mg total) by mouth daily. 02/02/24 02/01/25 Yes Arlon Carliss ORN, DO  cyanocobalamin  (VITAMIN B12) 1000 MCG tablet Take 1,000 mcg by mouth daily. 02/08/23  Yes [provider]  docusate sodium (COLACE) 100 MG capsule Take 100 mg by mouth 2 (two) times daily.   Yes [provider]  lenalidomide  (REVLIMID ) 10 MG capsule TAKE 1 CAPSULE BY MOUTH 1 TIME A DAY 03/20/24  Yes Melanee Annah BROCKS, MD  montelukast  (SINGULAIR ) 10 MG tablet Take 10 mg by mouth daily after breakfast.   Yes [provider]  nortriptyline  (PAMELOR ) 50 MG capsule Take 50 mg by mouth at bedtime.   Yes [provider]  omeprazole  (PRILOSEC) 20 MG capsule Take 20 mg by mouth daily.   Yes [provider]  ondansetron  (ZOFRAN ) 8 MG tablet Take 1 tablet (8 mg total) by mouth every 8 (eight) hours as needed. 10/04/23  Yes Melanee Annah BROCKS, MD  pentoxifylline  (TRENTAL ) 400 MG CR tablet Take 400 mg by mouth 2 (two) times daily. 11/14/21  Yes [provider]  sertraline  (ZOLOFT ) 100 MG tablet Take 100 mg by mouth 2 (two) times daily.   Yes [provider]  traZODone  (DESYREL ) 50 MG tablet Take 50  mg by mouth at bedtime. 03/24/21  Yes [provider]  Vitamin D, Ergocalciferol, (DRISDOL) 1.25 MG (50000 UNIT) CAPS capsule Take 1,250 mcg by mouth. 10/28/23 10/27/24 Yes [provider]  vitamin E 1000 UNIT capsule Take 1,000 Units by mouth daily.   Yes [provider]  dexamethasone  (DECADRON ) 4 MG tablet Take 5 tablets (20 mg total) by mouth once a week. Patient not taking: Reported on 04/12/2024 10/13/21   Melanee Annah BROCKS, MD    Allergies as of 04/04/2024 - Review Complete 01/29/2024  Allergen Reaction Noted   Levofloxacin  11/21/2013    Family History  Problem Relation Age of Onset   Prostate cancer Brother    Throat cancer Maternal Aunt     Breast cancer Cousin        mat cousin   Breast cancer Cousin    Thyroid  cancer Other     Social History   Socioeconomic History   Marital status: Widowed    Spouse name: Not on file   Number of children: Not on file   Years of education: Not on file   Highest education level: Not on file  Occupational History   Not on file  Tobacco Use   Smoking status: Never   Smokeless tobacco: Never  Vaping Use   Vaping status: Never Used  Substance and Sexual Activity   Alcohol use: No   Drug use: No   Sexual activity: Not Currently  Other Topics Concern   Not on file  Social History Narrative   Jehovah Witness    Social Drivers of Health   Tobacco Use: Low Risk (04/12/2024)   Patient History    Smoking Tobacco Use: Never    Smokeless Tobacco Use: Never    Passive Exposure: Not on file  Financial Resource Strain: Low Risk (08/12/2022)   Received from [facility]   Overall Financial Resource Strain (CARDIA)    Difficulty of Paying Living Expenses: Not very hard  Food Insecurity: No Food Insecurity (01/29/2024)   Epic    Worried About Running Out of Food in the Last Year: Never true    Ran Out of Food in the Last Year: Never true  Transportation Needs: No Transportation Needs (01/29/2024)   Epic    Lack of Transportation (Medical): No    Lack of Transportation (Non-Medical): No  Physical Activity: Not on file  Stress: Not on file  Social Connections: Moderately Integrated (01/29/2024)   Social Connection and Isolation Panel    Frequency of Communication with Friends and Family: Twice a week    Frequency of Social Gatherings with Friends and Family: Twice a week    Attends Religious Services: 1 to 4 times per year    Active Member of [employer] or Organizations: Yes    Attends Banker Meetings: 1 to 4 times per year    Marital Status: Widowed  Intimate Partner Violence: Not At Risk (01/29/2024)   Epic    Fear of Current or Ex-Partner: No    Emotionally Abused: No     Physically Abused: No    Sexually Abused: No  Depression (PHQ2-9): Not on file  Alcohol Screen: Not on file  Housing: Low Risk (01/29/2024)   Epic    Unable to Pay for Housing in the Last Year: No    Number of Times Moved in the Last Year: 0    Homeless in the Last Year: No  Utilities: Not At Risk (01/29/2024)   Epic  Threatened with loss of utilities: No  Health Literacy: Not on file    Review of Systems: See HPI, otherwise negative ROS  Physical Exam: There were no vitals taken for this visit. General:   Alert, cooperative in NAD Head:  Normocephalic and atraumatic. Respiratory:  Normal work of breathing. Cardiovascular:  RRR  Impression/Plan: Laurie [name] is here for cataract surgery.  Risks, benefits, limitations, and alternatives regarding cataract surgery have been reviewed with the patient.  Questions have been answered.  All parties agreeable.   Feliciano Bryan Ober, MD  04/13/2024, 7:15 AM

## 2024-04-13 NOTE — Anesthesia Postprocedure Evaluation (Signed)
 Anesthesia Post Note  Patient: Laurie [name]  Procedure(s) Performed: PHACOEMULSIFICATION, CATARACT, WITH IOL INSERTION 18.32, 01:34.5 (Left)  Patient location during evaluation: PACU Anesthesia Type: MAC Level of consciousness: awake and alert Pain management: pain level controlled Vital Signs Assessment: post-procedure vital signs reviewed and stable Respiratory status: spontaneous breathing, nonlabored ventilation, respiratory function stable and patient connected to nasal cannula oxygen Cardiovascular status: stable and blood pressure returned to baseline Postop Assessment: no apparent nausea or vomiting Anesthetic complications: no   No notable events documented.   Last Vitals:  Vitals:   04/13/24 1157 04/13/24 1203  BP: (!) 149/75 134/77  Pulse: 89 84  Resp: 17 10  Temp: (!) 36.2 C (!) 36.2 C  SpO2: 98% 97%    Last Pain:  Vitals:   04/13/24 1203  TempSrc:   PainSc: 0-No pain                 [name]

## 2024-04-13 NOTE — Discharge Instructions (Signed)

## 2024-04-13 NOTE — Op Note (Signed)
 OPERATIVE NOTE  Laurie [name] [id number] 04/13/2024   PREOPERATIVE DIAGNOSIS: Nuclear sclerotic cataract left eye. H25.12   POSTOPERATIVE DIAGNOSIS: Nuclear sclerotic cataract left eye. H25.12   PROCEDURE:  Phacoemulsification with posterior chamber intraocular lens placement of the left eye  Ultrasound time: Procedures: PHACOEMULSIFICATION, CATARACT, WITH IOL INSERTION 18.32, 01:34.5 (Left)  LENS:   Implant Name Type Inv. Item Serial No. Manufacturer Lot No. LRB No. Used Action  LENS IOL TECNIS EYHANCE 18.5 - [id number] Intraocular Lens LENS IOL TECNIS EYHANCE 18.5 [id number] [employer]  Left 1 Implanted      SURGEON:  Feliciano HERO. Enola, MD   ANESTHESIA:  Topical with tetracaine  drops, augmented with 1% preservative-free intracameral lidocaine .   COMPLICATIONS:  None.   DESCRIPTION OF PROCEDURE:  The patient was identified in the holding room and transported to the operating room and placed in the supine position under the operating microscope.  The left eye was identified as the operative eye, which was prepped and draped in the usual sterile ophthalmic fashion.   A 1 millimeter clear-corneal paracentesis was made inferotemporally. Preservative-free 1% lidocaine  mixed with 1:1,000 bisulfite-free aqueous solution of epinephrine  was injected into the anterior chamber. The anterior chamber was then filled with Viscoat viscoelastic. A 2.4 millimeter keratome was used to make a clear-corneal incision superotemporally. A curvilinear capsulorrhexis was made with a cystotome and capsulorrhexis forceps. Balanced salt  solution was used to hydrodissect and hydrodelineate the nucleus. Phacoemulsification was then used to remove the lens nucleus and epinucleus. The remaining cortex was then removed using the irrigation and aspiration handpiece. Provisc was then placed into the capsular bag to distend it for lens placement. A +18.50 D [id number] intraocular lens was then injected into the capsular bag.  The remaining viscoelastic was aspirated.   Wounds were hydrated with balanced salt  solution.  The anterior chamber was inflated to a physiologic pressure with balanced salt  solution.  No wound leaks were noted. Moxifloxacin  was injected intracamerally.  Timolol  and Brimonidine  drops were applied to the eye.  The patient was taken to the recovery room in stable condition without complications of anesthesia or surgery.  [name] 04/13/2024, 11:53 AM

## 2024-04-21 ENCOUNTER — Other Ambulatory Visit: Payer: Self-pay | Admitting: Oncology

## 2024-04-21 DIAGNOSIS — C9001 Multiple myeloma in remission: Secondary | ICD-10-CM

## 2024-05-12 ENCOUNTER — Other Ambulatory Visit: Payer: Self-pay

## 2024-05-16 ENCOUNTER — Other Ambulatory Visit: Payer: Self-pay | Admitting: Oncology

## 2024-05-16 DIAGNOSIS — C9001 Multiple myeloma in remission: Secondary | ICD-10-CM

## 2024-07-18 ENCOUNTER — Inpatient Hospital Stay: Admitting: Oncology

## 2024-07-18 ENCOUNTER — Inpatient Hospital Stay
